# Patient Record
Sex: Female | Born: 1955 | Race: White | Hispanic: No | Marital: Married | Smoking: Never smoker
Health system: Southern US, Community
[De-identification: ages and names within clinical notes are randomized; demographics above are authoritative.]

## PROBLEM LIST (undated history)

## (undated) DIAGNOSIS — K610 Anal abscess: Secondary | ICD-10-CM

## (undated) DIAGNOSIS — G629 Polyneuropathy, unspecified: Secondary | ICD-10-CM

## (undated) DIAGNOSIS — Z9484 Stem cells transplant status: Secondary | ICD-10-CM

## (undated) DIAGNOSIS — J302 Other seasonal allergic rhinitis: Secondary | ICD-10-CM

## (undated) DIAGNOSIS — E079 Disorder of thyroid, unspecified: Secondary | ICD-10-CM

## (undated) DIAGNOSIS — E039 Hypothyroidism, unspecified: Secondary | ICD-10-CM

## (undated) HISTORY — DX: Disorder of thyroid, unspecified: E07.9

## (undated) HISTORY — PX: TUBAL LIGATION: SHX77

## (undated) HISTORY — PX: TONSILECTOMY, ADENOIDECTOMY, BILATERAL MYRINGOTOMY AND TUBES: SHX2538

## (undated) HISTORY — PX: TONSILLECTOMY: SUR1361

## (undated) HISTORY — DX: Polyneuropathy, unspecified: G62.9

## (undated) HISTORY — PX: HAND SURGERY: SHX662

---

## 2001-08-16 ENCOUNTER — Ambulatory Visit (HOSPITAL_COMMUNITY): Admission: RE | Admit: 2001-08-16 | Discharge: 2001-08-16 | Payer: Self-pay | Admitting: Obstetrics & Gynecology

## 2001-08-16 ENCOUNTER — Encounter: Payer: Self-pay | Admitting: Obstetrics & Gynecology

## 2002-07-13 ENCOUNTER — Ambulatory Visit (HOSPITAL_COMMUNITY): Admission: RE | Admit: 2002-07-13 | Discharge: 2002-07-13 | Payer: Self-pay | Admitting: Family Medicine

## 2003-12-27 ENCOUNTER — Ambulatory Visit (HOSPITAL_COMMUNITY): Admission: RE | Admit: 2003-12-27 | Discharge: 2003-12-27 | Payer: Self-pay | Admitting: Family Medicine

## 2004-01-30 ENCOUNTER — Ambulatory Visit (HOSPITAL_COMMUNITY): Admission: RE | Admit: 2004-01-30 | Discharge: 2004-01-30 | Payer: Self-pay | Admitting: Obstetrics & Gynecology

## 2004-02-25 ENCOUNTER — Ambulatory Visit (HOSPITAL_COMMUNITY): Admission: RE | Admit: 2004-02-25 | Discharge: 2004-02-25 | Payer: Self-pay | Admitting: Family Medicine

## 2005-07-06 ENCOUNTER — Ambulatory Visit (HOSPITAL_COMMUNITY): Admission: RE | Admit: 2005-07-06 | Discharge: 2005-07-06 | Payer: Self-pay | Admitting: Family Medicine

## 2005-07-15 ENCOUNTER — Ambulatory Visit (HOSPITAL_COMMUNITY): Admission: RE | Admit: 2005-07-15 | Discharge: 2005-07-15 | Payer: Self-pay | Admitting: Family Medicine

## 2005-08-13 ENCOUNTER — Ambulatory Visit: Payer: Self-pay | Admitting: Orthopedic Surgery

## 2005-10-01 ENCOUNTER — Ambulatory Visit: Payer: Self-pay | Admitting: Orthopedic Surgery

## 2006-11-10 ENCOUNTER — Observation Stay (HOSPITAL_COMMUNITY): Admission: EM | Admit: 2006-11-10 | Discharge: 2006-11-11 | Payer: Self-pay | Admitting: Emergency Medicine

## 2006-11-11 ENCOUNTER — Ambulatory Visit: Payer: Self-pay | Admitting: Orthopedic Surgery

## 2006-11-24 ENCOUNTER — Ambulatory Visit: Payer: Self-pay | Admitting: Orthopedic Surgery

## 2006-12-29 ENCOUNTER — Ambulatory Visit: Payer: Self-pay | Admitting: Orthopedic Surgery

## 2007-05-09 ENCOUNTER — Ambulatory Visit (HOSPITAL_COMMUNITY): Admission: RE | Admit: 2007-05-09 | Discharge: 2007-05-09 | Payer: Self-pay | Admitting: Obstetrics and Gynecology

## 2007-05-25 ENCOUNTER — Ambulatory Visit (HOSPITAL_COMMUNITY): Admission: RE | Admit: 2007-05-25 | Discharge: 2007-05-25 | Payer: Self-pay | Admitting: Obstetrics and Gynecology

## 2007-05-30 ENCOUNTER — Other Ambulatory Visit: Admission: RE | Admit: 2007-05-30 | Discharge: 2007-05-30 | Payer: Self-pay | Admitting: Obstetrics and Gynecology

## 2007-06-06 ENCOUNTER — Encounter: Admission: RE | Admit: 2007-06-06 | Discharge: 2007-06-06 | Payer: Self-pay | Admitting: Obstetrics and Gynecology

## 2007-11-28 ENCOUNTER — Ambulatory Visit (HOSPITAL_COMMUNITY): Admission: RE | Admit: 2007-11-28 | Discharge: 2007-11-28 | Payer: Self-pay | Admitting: Obstetrics and Gynecology

## 2008-06-21 ENCOUNTER — Ambulatory Visit (HOSPITAL_COMMUNITY): Admission: RE | Admit: 2008-06-21 | Discharge: 2008-06-21 | Payer: Self-pay | Admitting: Obstetrics and Gynecology

## 2008-07-05 ENCOUNTER — Telehealth (INDEPENDENT_AMBULATORY_CARE_PROVIDER_SITE_OTHER): Payer: Self-pay

## 2008-07-06 ENCOUNTER — Encounter: Payer: Self-pay | Admitting: Gastroenterology

## 2008-07-10 ENCOUNTER — Encounter: Payer: Self-pay | Admitting: Gastroenterology

## 2008-07-13 ENCOUNTER — Ambulatory Visit: Payer: Self-pay | Admitting: Gastroenterology

## 2008-07-13 ENCOUNTER — Ambulatory Visit (HOSPITAL_COMMUNITY): Admission: RE | Admit: 2008-07-13 | Discharge: 2008-07-13 | Payer: Self-pay | Admitting: Gastroenterology

## 2008-09-11 ENCOUNTER — Ambulatory Visit (HOSPITAL_COMMUNITY): Admission: RE | Admit: 2008-09-11 | Discharge: 2008-09-11 | Payer: Self-pay | Admitting: Family Medicine

## 2008-09-18 ENCOUNTER — Emergency Department (HOSPITAL_COMMUNITY): Admission: EM | Admit: 2008-09-18 | Discharge: 2008-09-18 | Payer: Self-pay | Admitting: Emergency Medicine

## 2008-09-28 ENCOUNTER — Encounter (HOSPITAL_COMMUNITY): Admission: RE | Admit: 2008-09-28 | Discharge: 2008-10-28 | Payer: Self-pay | Admitting: Family Medicine

## 2008-10-05 ENCOUNTER — Ambulatory Visit (HOSPITAL_COMMUNITY): Admission: RE | Admit: 2008-10-05 | Discharge: 2008-10-05 | Payer: Self-pay | Admitting: Family Medicine

## 2008-10-08 ENCOUNTER — Ambulatory Visit (HOSPITAL_COMMUNITY): Payer: Self-pay | Admitting: Oncology

## 2008-10-08 ENCOUNTER — Inpatient Hospital Stay (HOSPITAL_COMMUNITY): Admission: EM | Admit: 2008-10-08 | Discharge: 2008-10-12 | Payer: Self-pay | Admitting: Diagnostic Radiology

## 2008-10-08 ENCOUNTER — Encounter (HOSPITAL_COMMUNITY): Admission: RE | Admit: 2008-10-08 | Discharge: 2008-11-07 | Payer: Self-pay | Admitting: Oncology

## 2008-10-09 ENCOUNTER — Ambulatory Visit: Payer: Self-pay | Admitting: Oncology

## 2008-10-09 ENCOUNTER — Encounter (INDEPENDENT_AMBULATORY_CARE_PROVIDER_SITE_OTHER): Payer: Self-pay | Admitting: Interventional Radiology

## 2008-10-10 ENCOUNTER — Encounter: Payer: Self-pay | Admitting: Neurosurgery

## 2008-10-12 ENCOUNTER — Encounter: Payer: Self-pay | Admitting: Oncology

## 2008-10-15 ENCOUNTER — Ambulatory Visit: Admission: RE | Admit: 2008-10-15 | Discharge: 2008-11-14 | Payer: Self-pay | Admitting: Radiation Oncology

## 2008-11-09 ENCOUNTER — Encounter (HOSPITAL_COMMUNITY): Admission: RE | Admit: 2008-11-09 | Discharge: 2008-12-09 | Payer: Self-pay | Admitting: Oncology

## 2008-11-26 ENCOUNTER — Ambulatory Visit (HOSPITAL_COMMUNITY): Payer: Self-pay | Admitting: Oncology

## 2008-12-10 ENCOUNTER — Encounter (HOSPITAL_COMMUNITY): Admission: RE | Admit: 2008-12-10 | Discharge: 2009-01-09 | Payer: Self-pay | Admitting: Oncology

## 2009-01-11 ENCOUNTER — Ambulatory Visit (HOSPITAL_COMMUNITY): Payer: Self-pay | Admitting: Oncology

## 2009-02-04 ENCOUNTER — Encounter (HOSPITAL_COMMUNITY): Admission: RE | Admit: 2009-02-04 | Discharge: 2009-03-06 | Payer: Self-pay | Admitting: Oncology

## 2009-03-25 ENCOUNTER — Encounter (HOSPITAL_COMMUNITY): Admission: RE | Admit: 2009-03-25 | Discharge: 2009-04-24 | Payer: Self-pay | Admitting: Oncology

## 2009-03-25 ENCOUNTER — Ambulatory Visit (HOSPITAL_COMMUNITY): Payer: Self-pay | Admitting: Oncology

## 2009-04-03 ENCOUNTER — Ambulatory Visit (HOSPITAL_COMMUNITY): Admission: RE | Admit: 2009-04-03 | Discharge: 2009-04-03 | Payer: Self-pay | Admitting: Neurosurgery

## 2009-05-14 ENCOUNTER — Ambulatory Visit (HOSPITAL_COMMUNITY): Payer: Self-pay | Admitting: Oncology

## 2009-05-14 ENCOUNTER — Encounter (HOSPITAL_COMMUNITY): Admission: RE | Admit: 2009-05-14 | Discharge: 2009-06-13 | Payer: Self-pay | Admitting: Oncology

## 2009-07-02 ENCOUNTER — Encounter (HOSPITAL_COMMUNITY): Admission: RE | Admit: 2009-07-02 | Discharge: 2009-08-01 | Payer: Self-pay | Admitting: Oncology

## 2009-07-17 ENCOUNTER — Ambulatory Visit (HOSPITAL_COMMUNITY): Payer: Self-pay | Admitting: Oncology

## 2009-08-01 ENCOUNTER — Encounter (HOSPITAL_COMMUNITY): Admission: RE | Admit: 2009-08-01 | Discharge: 2009-08-31 | Payer: Self-pay | Admitting: Oncology

## 2009-08-03 ENCOUNTER — Inpatient Hospital Stay (HOSPITAL_COMMUNITY): Admission: EM | Admit: 2009-08-03 | Discharge: 2009-08-08 | Payer: Self-pay | Admitting: Emergency Medicine

## 2009-08-05 ENCOUNTER — Ambulatory Visit: Payer: Self-pay | Admitting: Oncology

## 2009-10-16 ENCOUNTER — Other Ambulatory Visit: Admission: RE | Admit: 2009-10-16 | Discharge: 2009-10-16 | Payer: Self-pay | Admitting: Obstetrics and Gynecology

## 2009-10-29 ENCOUNTER — Ambulatory Visit (HOSPITAL_COMMUNITY): Payer: Self-pay | Admitting: Oncology

## 2009-10-29 ENCOUNTER — Encounter (HOSPITAL_COMMUNITY): Admission: RE | Admit: 2009-10-29 | Discharge: 2009-11-28 | Payer: Self-pay | Admitting: Oncology

## 2009-11-01 ENCOUNTER — Ambulatory Visit (HOSPITAL_COMMUNITY): Admission: RE | Admit: 2009-11-01 | Discharge: 2009-11-01 | Payer: Self-pay | Admitting: Obstetrics and Gynecology

## 2009-12-03 ENCOUNTER — Encounter (HOSPITAL_COMMUNITY): Admission: RE | Admit: 2009-12-03 | Discharge: 2010-01-02 | Payer: Self-pay | Admitting: Oncology

## 2009-12-17 ENCOUNTER — Ambulatory Visit (HOSPITAL_COMMUNITY): Payer: Self-pay | Admitting: Oncology

## 2010-01-07 ENCOUNTER — Encounter (HOSPITAL_COMMUNITY): Admission: RE | Admit: 2010-01-07 | Discharge: 2010-01-24 | Payer: Self-pay | Admitting: Oncology

## 2010-02-26 ENCOUNTER — Ambulatory Visit (HOSPITAL_COMMUNITY): Payer: Self-pay | Admitting: Oncology

## 2010-03-04 ENCOUNTER — Encounter (HOSPITAL_COMMUNITY)
Admission: RE | Admit: 2010-03-04 | Discharge: 2010-04-03 | Payer: Self-pay | Source: Home / Self Care | Attending: Oncology | Admitting: Oncology

## 2010-05-12 ENCOUNTER — Encounter (HOSPITAL_COMMUNITY)
Admission: RE | Admit: 2010-05-12 | Discharge: 2010-05-27 | Payer: Self-pay | Source: Home / Self Care | Attending: Oncology | Admitting: Oncology

## 2010-05-12 ENCOUNTER — Ambulatory Visit (HOSPITAL_COMMUNITY)
Admission: RE | Admit: 2010-05-12 | Discharge: 2010-05-27 | Payer: Self-pay | Source: Home / Self Care | Attending: Oncology | Admitting: Oncology

## 2010-05-12 LAB — COMPREHENSIVE METABOLIC PANEL
ALT: 21 U/L (ref 0–35)
AST: 24 U/L (ref 0–37)
Albumin: 4.3 g/dL (ref 3.5–5.2)
Alkaline Phosphatase: 43 U/L (ref 39–117)
BUN: 12 mg/dL (ref 6–23)
CO2: 29 mEq/L (ref 19–32)
Calcium: 9.7 mg/dL (ref 8.4–10.5)
Chloride: 101 mEq/L (ref 96–112)
Creatinine, Ser: 1.17 mg/dL (ref 0.4–1.2)
GFR calc Af Amer: 59 mL/min — ABNORMAL LOW (ref >60)
GFR calc non Af Amer: 49 mL/min — ABNORMAL LOW (ref >60)
Glucose, Bld: 90 mg/dL (ref 70–99)
Potassium: 4.1 mEq/L (ref 3.5–5.1)
Sodium: 139 mEq/L (ref 135–145)
Total Bilirubin: 0.8 mg/dL (ref 0.3–1.2)
Total Protein: 7 g/dL (ref 6.0–8.3)

## 2010-05-12 LAB — DIFFERENTIAL
Basophils Absolute: 0 10*3/uL (ref 0.0–0.1)
Basophils Relative: 1 % (ref 0–1)
Eosinophils Absolute: 0 10*3/uL (ref 0.0–0.7)
Eosinophils Relative: 1 % (ref 0–5)
Lymphocytes Relative: 20 % (ref 12–46)
Lymphs Abs: 0.7 10*3/uL (ref 0.7–4.0)
Monocytes Absolute: 0.5 10*3/uL (ref 0.1–1.0)
Monocytes Relative: 13 % — ABNORMAL HIGH (ref 3–12)
Neutro Abs: 2.3 10*3/uL (ref 1.7–7.7)
Neutrophils Relative %: 66 % (ref 43–77)

## 2010-05-12 LAB — CBC
HCT: 38.1 % (ref 36.0–46.0)
Hemoglobin: 13.7 g/dL (ref 12.0–15.0)
MCH: 34.4 pg — ABNORMAL HIGH (ref 26.0–34.0)
MCHC: 36 g/dL (ref 30.0–36.0)
MCV: 95.7 fL (ref 78.0–100.0)
Platelets: 154 10*3/uL (ref 150–400)
RBC: 3.98 MIL/uL (ref 3.87–5.11)
RDW: 12.6 % (ref 11.5–15.5)
WBC: 3.6 10*3/uL — ABNORMAL LOW (ref 4.0–10.5)

## 2010-05-12 LAB — KAPPA/LAMBDA LIGHT CHAINS
Kappa free light chain: 1.21 mg/dL (ref 0.33–1.94)
Kappa, lambda light chain ratio: 0.89 (ref 0.26–1.65)
Lambda free light chains: 1.36 mg/dL (ref 0.57–2.63)

## 2010-05-14 LAB — MULTIPLE MYELOMA PANEL, SERUM
Albumin ELP: 60.7 % (ref 55.8–66.1)
Alpha-1-Globulin: 3.4 % (ref 2.9–4.9)
Alpha-2-Globulin: 12.2 % — ABNORMAL HIGH (ref 7.1–11.8)
Beta 2: 4.1 % (ref 3.2–6.5)
Beta Globulin: 5.3 % (ref 4.7–7.2)
Gamma Globulin: 14.3 % (ref 11.1–18.8)
IgA: 100 mg/dL (ref 68–378)
IgG (Immunoglobin G), Serum: 1230 mg/dL (ref 694–1618)
IgM, Serum: 60 mg/dL (ref 60–263)
M-Spike, %: NOT DETECTED g/dL
Total Protein: 7.5 g/dL (ref 6.0–8.3)

## 2010-05-15 ENCOUNTER — Ambulatory Visit (HOSPITAL_COMMUNITY)
Admission: RE | Admit: 2010-05-15 | Discharge: 2010-05-15 | Payer: Self-pay | Source: Home / Self Care | Attending: Oncology | Admitting: Oncology

## 2010-05-18 ENCOUNTER — Encounter: Payer: Self-pay | Admitting: Obstetrics and Gynecology

## 2010-05-19 ENCOUNTER — Encounter: Payer: Self-pay | Admitting: Family Medicine

## 2010-06-19 ENCOUNTER — Encounter (HOSPITAL_COMMUNITY): Payer: 59 | Attending: Oncology

## 2010-06-19 ENCOUNTER — Other Ambulatory Visit (HOSPITAL_COMMUNITY): Payer: 59

## 2010-06-19 DIAGNOSIS — Z9481 Bone marrow transplant status: Secondary | ICD-10-CM | POA: Insufficient documentation

## 2010-06-19 DIAGNOSIS — C9000 Multiple myeloma not having achieved remission: Secondary | ICD-10-CM

## 2010-06-19 DIAGNOSIS — Z79899 Other long term (current) drug therapy: Secondary | ICD-10-CM | POA: Insufficient documentation

## 2010-06-20 ENCOUNTER — Ambulatory Visit (HOSPITAL_COMMUNITY): Payer: 59

## 2010-06-20 DIAGNOSIS — C9000 Multiple myeloma not having achieved remission: Secondary | ICD-10-CM

## 2010-07-01 ENCOUNTER — Ambulatory Visit (HOSPITAL_COMMUNITY): Payer: 59 | Admitting: Oncology

## 2010-07-08 LAB — COMPREHENSIVE METABOLIC PANEL
ALT: 17 U/L (ref 0–35)
Albumin: 4 g/dL (ref 3.5–5.2)
Alkaline Phosphatase: 46 U/L (ref 39–117)
Calcium: 9.3 mg/dL (ref 8.4–10.5)
GFR calc Af Amer: >60 mL/min (ref >60)
Potassium: 4.4 mEq/L (ref 3.5–5.1)
Sodium: 137 mEq/L (ref 135–145)
Total Protein: 6.8 g/dL (ref 6.0–8.3)

## 2010-07-08 LAB — CBC
Platelets: 165 10*3/uL (ref 150–400)
RDW: 12.7 % (ref 11.5–15.5)
WBC: 3.6 10*3/uL — ABNORMAL LOW (ref 4.0–10.5)

## 2010-07-08 LAB — DIFFERENTIAL
Basophils Relative: 1 % (ref 0–1)
Eosinophils Absolute: 0.2 10*3/uL (ref 0.0–0.7)
Lymphs Abs: 1 10*3/uL (ref 0.7–4.0)
Monocytes Absolute: 0.7 10*3/uL (ref 0.1–1.0)
Monocytes Relative: 19 % — ABNORMAL HIGH (ref 3–12)

## 2010-07-09 LAB — COMPREHENSIVE METABOLIC PANEL
AST: 22 U/L (ref 0–37)
BUN: 13 mg/dL (ref 6–23)
CO2: 30 mEq/L (ref 19–32)
Calcium: 9.2 mg/dL (ref 8.4–10.5)
Chloride: 103 mEq/L (ref 96–112)
Creatinine, Ser: 1.04 mg/dL (ref 0.4–1.2)
GFR calc Af Amer: >60 mL/min (ref >60)
GFR calc non Af Amer: 56 mL/min — ABNORMAL LOW (ref >60)
Total Bilirubin: 0.6 mg/dL (ref 0.3–1.2)

## 2010-07-09 LAB — CBC
Hemoglobin: 12 g/dL (ref 12.0–15.0)
MCH: 35.4 pg — ABNORMAL HIGH (ref 26.0–34.0)
MCHC: 34.7 g/dL (ref 30.0–36.0)
MCV: 101.9 fL — ABNORMAL HIGH (ref 78.0–100.0)
Platelets: 158 10*3/uL (ref 150–400)

## 2010-07-09 LAB — DIFFERENTIAL
Basophils Absolute: 0 10*3/uL (ref 0.0–0.1)
Eosinophils Relative: 5 % (ref 0–5)
Lymphocytes Relative: 31 % (ref 12–46)
Lymphs Abs: 0.9 10*3/uL (ref 0.7–4.0)
Neutro Abs: 1.4 10*3/uL — ABNORMAL LOW (ref 1.7–7.7)

## 2010-07-10 ENCOUNTER — Encounter (HOSPITAL_COMMUNITY): Payer: 59 | Attending: Oncology

## 2010-07-10 ENCOUNTER — Other Ambulatory Visit (HOSPITAL_COMMUNITY): Payer: 59

## 2010-07-10 DIAGNOSIS — Z79899 Other long term (current) drug therapy: Secondary | ICD-10-CM | POA: Insufficient documentation

## 2010-07-10 DIAGNOSIS — C9000 Multiple myeloma not having achieved remission: Secondary | ICD-10-CM | POA: Insufficient documentation

## 2010-07-10 DIAGNOSIS — Z9481 Bone marrow transplant status: Secondary | ICD-10-CM | POA: Insufficient documentation

## 2010-07-10 LAB — DIFFERENTIAL
Basophils Absolute: 0 10*3/uL (ref 0.0–0.1)
Eosinophils Relative: 4 % (ref 0–5)
Lymphocytes Relative: 26 % (ref 12–46)
Lymphocytes Relative: 20 % (ref 12–46)
Lymphs Abs: 0.7 10*3/uL (ref 0.7–4.0)
Monocytes Absolute: 0.3 10*3/uL (ref 0.1–1.0)
Monocytes Absolute: 0.4 10*3/uL (ref 0.1–1.0)
Monocytes Relative: 10 % (ref 3–12)
Monocytes Relative: 11 % (ref 3–12)
Neutro Abs: 1.6 10*3/uL — ABNORMAL LOW (ref 1.7–7.7)
Neutro Abs: 2.5 10*3/uL (ref 1.7–7.7)

## 2010-07-10 LAB — COMPREHENSIVE METABOLIC PANEL
Albumin: 4.6 g/dL (ref 3.5–5.2)
Alkaline Phosphatase: 53 U/L (ref 39–117)
BUN: 13 mg/dL (ref 6–23)
CO2: 30 mEq/L (ref 19–32)
Chloride: 104 mEq/L (ref 96–112)
Creatinine, Ser: 1 mg/dL (ref 0.4–1.2)
GFR calc non Af Amer: 58 mL/min — ABNORMAL LOW (ref >60)
Potassium: 3.9 mEq/L (ref 3.5–5.1)
Total Bilirubin: 0.6 mg/dL (ref 0.3–1.2)

## 2010-07-10 LAB — CBC
HCT: 35.4 % — ABNORMAL LOW (ref 36.0–46.0)
HCT: 35 % — ABNORMAL LOW (ref 36.0–46.0)
Hemoglobin: 12.4 g/dL (ref 12.0–15.0)
Hemoglobin: 12.2 g/dL (ref 12.0–15.0)
MCV: 102.6 fL — ABNORMAL HIGH (ref 78.0–100.0)
MCV: 102.4 fL — ABNORMAL HIGH (ref 78.0–100.0)
RBC: 3.46 MIL/uL — ABNORMAL LOW (ref 3.87–5.11)
WBC: 2.7 10*3/uL — ABNORMAL LOW (ref 4.0–10.5)
WBC: 3.8 10*3/uL — ABNORMAL LOW (ref 4.0–10.5)

## 2010-07-10 LAB — MULTIPLE MYELOMA PANEL, SERUM
Alpha-1-Globulin: 3.3 % (ref 2.9–4.9)
Alpha-2-Globulin: 12.2 % — ABNORMAL HIGH (ref 7.1–11.8)
Beta Globulin: 5.5 % (ref 4.7–7.2)
Gamma Globulin: 12.7 % (ref 11.1–18.8)
IgM, Serum: 29 mg/dL — ABNORMAL LOW (ref 60–263)
Total Protein: 7.1 g/dL (ref 6.0–8.3)

## 2010-07-10 LAB — KAPPA/LAMBDA LIGHT CHAINS: Lambda free light chains: 1.17 mg/dL (ref 0.57–2.63)

## 2010-07-11 LAB — MULTIPLE MYELOMA PANEL, SERUM
Albumin ELP: 64.9 % (ref 55.8–66.1)
IgA: 37 mg/dL — ABNORMAL LOW (ref 68–378)
IgG (Immunoglobin G), Serum: 920 mg/dL (ref 694–1618)
IgM, Serum: 19 mg/dL — ABNORMAL LOW (ref 60–263)
Total Protein: 6.8 g/dL (ref 6.0–8.3)

## 2010-07-11 LAB — DIFFERENTIAL
Basophils Relative: 1 % (ref 0–1)
Eosinophils Absolute: 0.1 10*3/uL (ref 0.0–0.7)
Eosinophils Relative: 2 % (ref 0–5)
Monocytes Absolute: 0.3 10*3/uL (ref 0.1–1.0)
Monocytes Relative: 9 % (ref 3–12)

## 2010-07-11 LAB — COMPREHENSIVE METABOLIC PANEL
AST: 20 U/L (ref 0–37)
Albumin: 4.3 g/dL (ref 3.5–5.2)
Calcium: 9.5 mg/dL (ref 8.4–10.5)
Creatinine, Ser: 0.97 mg/dL (ref 0.4–1.2)
GFR calc Af Amer: >60 mL/min (ref >60)
GFR calc non Af Amer: >60 mL/min (ref >60)

## 2010-07-11 LAB — KAPPA/LAMBDA LIGHT CHAINS: Kappa, lambda light chain ratio: 0.7 (ref 0.26–1.65)

## 2010-07-11 LAB — CBC
HCT: 35.2 % — ABNORMAL LOW (ref 36.0–46.0)
Hemoglobin: 12.4 g/dL (ref 12.0–15.0)
MCH: 36.3 pg — ABNORMAL HIGH (ref 26.0–34.0)
MCHC: 35.2 g/dL (ref 30.0–36.0)
MCV: 103.4 fL — ABNORMAL HIGH (ref 78.0–100.0)

## 2010-07-13 LAB — DIFFERENTIAL
Basophils Relative: 0 % (ref 0–1)
Monocytes Absolute: 0.4 10*3/uL (ref 0.1–1.0)
Monocytes Relative: 12 % (ref 3–12)
Neutro Abs: 1.8 10*3/uL (ref 1.7–7.7)

## 2010-07-13 LAB — CBC
HCT: 34 % — ABNORMAL LOW (ref 36.0–46.0)
Hemoglobin: 12 g/dL (ref 12.0–15.0)
MCH: 36.7 pg — ABNORMAL HIGH (ref 26.0–34.0)
MCHC: 35.4 g/dL (ref 30.0–36.0)
MCV: 103.9 fL — ABNORMAL HIGH (ref 78.0–100.0)
Platelets: 163 10*3/uL (ref 150–400)
RBC: 3.28 MIL/uL — ABNORMAL LOW (ref 3.87–5.11)
RDW: 12.2 % (ref 11.5–15.5)
WBC: 3 10*3/uL — ABNORMAL LOW (ref 4.0–10.5)

## 2010-07-15 LAB — CBC
HCT: 33.8 % — ABNORMAL LOW (ref 36.0–46.0)
Hemoglobin: 11.9 g/dL — ABNORMAL LOW (ref 12.0–15.0)
MCHC: 35.4 g/dL (ref 30.0–36.0)
MCV: 101.1 fL — ABNORMAL HIGH (ref 78.0–100.0)
RDW: 14.4 % (ref 11.5–15.5)

## 2010-07-15 LAB — DIFFERENTIAL
Basophils Absolute: 0 10*3/uL (ref 0.0–0.1)
Basophils Relative: 1 % (ref 0–1)
Eosinophils Absolute: 0 10*3/uL (ref 0.0–0.7)
Eosinophils Relative: 2 % (ref 0–5)
Monocytes Absolute: 0.2 10*3/uL (ref 0.1–1.0)
Neutro Abs: 1.2 10*3/uL — ABNORMAL LOW (ref 1.7–7.7)

## 2010-07-16 LAB — CBC
HCT: 27.4 % — ABNORMAL LOW (ref 36.0–46.0)
HCT: 28.7 % — ABNORMAL LOW (ref 36.0–46.0)
HCT: 28.9 % — ABNORMAL LOW (ref 36.0–46.0)
HCT: 31.5 % — ABNORMAL LOW (ref 36.0–46.0)
Hemoglobin: 10.4 g/dL — ABNORMAL LOW (ref 12.0–15.0)
Hemoglobin: 10.5 g/dL — ABNORMAL LOW (ref 12.0–15.0)
Hemoglobin: 9.8 g/dL — ABNORMAL LOW (ref 12.0–15.0)
Hemoglobin: 10.8 g/dL — ABNORMAL LOW (ref 12.0–15.0)
Hemoglobin: 11 g/dL — ABNORMAL LOW (ref 12.0–15.0)
Hemoglobin: 11.1 g/dL — ABNORMAL LOW (ref 12.0–15.0)
MCHC: 35.8 g/dL (ref 30.0–36.0)
MCHC: 35.8 g/dL (ref 30.0–36.0)
MCHC: 37.2 g/dL — ABNORMAL HIGH (ref 30.0–36.0)
MCHC: 34.1 g/dL (ref 30.0–36.0)
MCHC: 34.2 g/dL (ref 30.0–36.0)
MCHC: 34.8 g/dL (ref 30.0–36.0)
MCV: 101.5 fL — ABNORMAL HIGH (ref 78.0–100.0)
MCV: 100 fL (ref 78.0–100.0)
MCV: 100.1 fL — ABNORMAL HIGH (ref 78.0–100.0)
MCV: 100.5 fL — ABNORMAL HIGH (ref 78.0–100.0)
Platelets: 103 10*3/uL — ABNORMAL LOW (ref 150–400)
Platelets: 54 10*3/uL — ABNORMAL LOW (ref 150–400)
Platelets: 57 10*3/uL — ABNORMAL LOW (ref 150–400)
Platelets: 62 10*3/uL — ABNORMAL LOW (ref 150–400)
Platelets: 84 10*3/uL — ABNORMAL LOW (ref 150–400)
Platelets: 186 10*3/uL (ref 150–400)
RBC: 2.82 MIL/uL — ABNORMAL LOW (ref 3.87–5.11)
RBC: 2.83 MIL/uL — ABNORMAL LOW (ref 3.87–5.11)
RBC: 3.13 MIL/uL — ABNORMAL LOW (ref 3.87–5.11)
RBC: 3.2 MIL/uL — ABNORMAL LOW (ref 3.87–5.11)
RBC: 3.22 MIL/uL — ABNORMAL LOW (ref 3.87–5.11)
RDW: 13.7 % (ref 11.5–15.5)
RDW: 14.1 % (ref 11.5–15.5)
RDW: 14.6 % (ref 11.5–15.5)
RDW: 14.6 % (ref 11.5–15.5)
RDW: 16.2 % — ABNORMAL HIGH (ref 11.5–15.5)
WBC: 0.8 10*3/uL — CL (ref 4.0–10.5)
WBC: 0.9 10*3/uL — CL (ref 4.0–10.5)
WBC: 2 10*3/uL — ABNORMAL LOW (ref 4.0–10.5)
WBC: 3.1 10*3/uL — ABNORMAL LOW (ref 4.0–10.5)
WBC: 3.4 10*3/uL — ABNORMAL LOW (ref 4.0–10.5)
WBC: 5.6 10*3/uL (ref 4.0–10.5)

## 2010-07-16 LAB — COMPREHENSIVE METABOLIC PANEL
ALT: 22 U/L (ref 0–35)
ALT: 40 U/L — ABNORMAL HIGH (ref 0–35)
AST: 19 U/L (ref 0–37)
AST: 22 U/L (ref 0–37)
AST: 33 U/L (ref 0–37)
Albumin: 2.9 g/dL — ABNORMAL LOW (ref 3.5–5.2)
Alkaline Phosphatase: 37 U/L — ABNORMAL LOW (ref 39–117)
Alkaline Phosphatase: 49 U/L (ref 39–117)
BUN: 8 mg/dL (ref 6–23)
CO2: 25 mEq/L (ref 19–32)
CO2: 33 mEq/L — ABNORMAL HIGH (ref 19–32)
Calcium: 8.1 mg/dL — ABNORMAL LOW (ref 8.4–10.5)
Calcium: 9.5 mg/dL (ref 8.4–10.5)
Chloride: 107 mEq/L (ref 96–112)
Creatinine, Ser: 0.82 mg/dL (ref 0.4–1.2)
GFR calc Af Amer: >60 mL/min (ref >60)
GFR calc Af Amer: >60 mL/min (ref >60)
GFR calc Af Amer: >60 mL/min (ref >60)
GFR calc non Af Amer: >60 mL/min (ref >60)
GFR calc non Af Amer: >60 mL/min (ref >60)
Glucose, Bld: 95 mg/dL (ref 70–99)
Glucose, Bld: 107 mg/dL — ABNORMAL HIGH (ref 70–99)
Potassium: 3.3 mEq/L — ABNORMAL LOW (ref 3.5–5.1)
Potassium: 3.8 mEq/L (ref 3.5–5.1)
Potassium: 3.7 mEq/L (ref 3.5–5.1)
Sodium: 138 mEq/L (ref 135–145)
Sodium: 139 mEq/L (ref 135–145)
Total Protein: 5.3 g/dL — ABNORMAL LOW (ref 6.0–8.3)

## 2010-07-16 LAB — BASIC METABOLIC PANEL
BUN: 11 mg/dL (ref 6–23)
BUN: 4 mg/dL — ABNORMAL LOW (ref 6–23)
BUN: 5 mg/dL — ABNORMAL LOW (ref 6–23)
CO2: 24 mEq/L (ref 19–32)
CO2: 25 mEq/L (ref 19–32)
Calcium: 7.6 mg/dL — ABNORMAL LOW (ref 8.4–10.5)
Calcium: 7.6 mg/dL — ABNORMAL LOW (ref 8.4–10.5)
Calcium: 8.9 mg/dL (ref 8.4–10.5)
Creatinine, Ser: 0.71 mg/dL (ref 0.4–1.2)
GFR calc non Af Amer: 57 mL/min — ABNORMAL LOW (ref >60)
GFR calc non Af Amer: >60 mL/min (ref >60)
GFR calc non Af Amer: >60 mL/min (ref >60)
Glucose, Bld: 84 mg/dL (ref 70–99)
Glucose, Bld: 90 mg/dL (ref 70–99)
Potassium: 3.6 mEq/L (ref 3.5–5.1)
Potassium: 3.7 mEq/L (ref 3.5–5.1)
Sodium: 132 mEq/L — ABNORMAL LOW (ref 135–145)
Sodium: 141 mEq/L (ref 135–145)

## 2010-07-16 LAB — DIFFERENTIAL
Basophils Absolute: 0 10*3/uL (ref 0.0–0.1)
Basophils Absolute: 0 10*3/uL (ref 0.0–0.1)
Basophils Absolute: 0 10*3/uL (ref 0.0–0.1)
Basophils Absolute: 0 10*3/uL (ref 0.0–0.1)
Basophils Absolute: 0 10*3/uL (ref 0.0–0.1)
Basophils Relative: 1 % (ref 0–1)
Basophils Relative: 1 % (ref 0–1)
Basophils Relative: 0 % (ref 0–1)
Basophils Relative: 0 % (ref 0–1)
Basophils Relative: 1 % (ref 0–1)
Eosinophils Absolute: 0 10*3/uL (ref 0.0–0.7)
Eosinophils Absolute: 0.1 10*3/uL (ref 0.0–0.7)
Eosinophils Absolute: 0.1 10*3/uL (ref 0.0–0.7)
Eosinophils Absolute: 0.1 10*3/uL (ref 0.0–0.7)
Eosinophils Relative: 0 % (ref 0–5)
Eosinophils Relative: 0 % (ref 0–5)
Eosinophils Relative: 3 % (ref 0–5)
Eosinophils Relative: 4 % (ref 0–5)
Eosinophils Relative: 2 % (ref 0–5)
Eosinophils Relative: 3 % (ref 0–5)
Eosinophils Relative: 4 % (ref 0–5)
Lymphocytes Relative: 14 % (ref 12–46)
Lymphocytes Relative: 16 % (ref 12–46)
Lymphocytes Relative: 23 % (ref 12–46)
Lymphocytes Relative: 27 % (ref 12–46)
Lymphocytes Relative: 30 % (ref 12–46)
Lymphocytes Relative: 34 % (ref 12–46)
Lymphs Abs: 0.2 10*3/uL — ABNORMAL LOW (ref 0.7–4.0)
Lymphs Abs: 0.2 10*3/uL — ABNORMAL LOW (ref 0.7–4.0)
Lymphs Abs: 0.5 10*3/uL — ABNORMAL LOW (ref 0.7–4.0)
Lymphs Abs: 1 10*3/uL (ref 0.7–4.0)
Lymphs Abs: 1.2 10*3/uL (ref 0.7–4.0)
Monocytes Absolute: 0.2 10*3/uL (ref 0.1–1.0)
Monocytes Absolute: 0.2 10*3/uL (ref 0.1–1.0)
Monocytes Absolute: 0.2 10*3/uL (ref 0.1–1.0)
Monocytes Absolute: 0.2 10*3/uL (ref 0.1–1.0)
Monocytes Absolute: 0.5 10*3/uL (ref 0.1–1.0)
Monocytes Absolute: 0.5 10*3/uL (ref 0.1–1.0)
Monocytes Relative: 17 % — ABNORMAL HIGH (ref 3–12)
Monocytes Relative: 8 % (ref 3–12)
Monocytes Relative: 12 % (ref 3–12)
Monocytes Relative: 14 % — ABNORMAL HIGH (ref 3–12)
Monocytes Relative: 8 % (ref 3–12)
Neutro Abs: 0.4 10*3/uL — ABNORMAL LOW (ref 1.7–7.7)
Neutro Abs: 1 10*3/uL — ABNORMAL LOW (ref 1.7–7.7)
Neutro Abs: 1.5 10*3/uL — ABNORMAL LOW (ref 1.7–7.7)
Neutro Abs: 3.4 10*3/uL (ref 1.7–7.7)
Neutrophils Relative %: 43 % (ref 43–77)
Neutrophils Relative %: 51 % (ref 43–77)
Neutrophils Relative %: 51 % (ref 43–77)
Neutrophils Relative %: 51 % (ref 43–77)
Neutrophils Relative %: 63 % (ref 43–77)
Neutrophils Relative %: 79 % — ABNORMAL HIGH (ref 43–77)
Smear Review: DECREASED

## 2010-07-16 LAB — TSH: TSH: 3.967 u[IU]/mL (ref 0.350–4.500)

## 2010-07-16 LAB — BRAIN NATRIURETIC PEPTIDE
Pro B Natriuretic peptide (BNP): 50.4 pg/mL (ref 0.0–100.0)
Pro B Natriuretic peptide (BNP): 52.2 pg/mL (ref 0.0–100.0)

## 2010-07-16 LAB — URINALYSIS, ROUTINE W REFLEX MICROSCOPIC
Nitrite: NEGATIVE
Specific Gravity, Urine: 1.01 (ref 1.005–1.030)
Urobilinogen, UA: 0.2 mg/dL (ref 0.0–1.0)
pH: 6.5 (ref 5.0–8.0)

## 2010-07-16 LAB — PROTEIN ELECTROPHORESIS, SERUM
Albumin ELP: 61.5 % (ref 55.8–66.1)
M-Spike, %: NOT DETECTED g/dL
Total Protein ELP: 6.7 g/dL (ref 6.0–8.3)

## 2010-07-16 LAB — MISCELLANEOUS TEST

## 2010-07-16 LAB — CULTURE, BLOOD (ROUTINE X 2)
Culture: NO GROWTH
Culture: NO GROWTH
Report Status: 4142011

## 2010-07-16 LAB — IMMUNOFIXATION ADD-ON

## 2010-07-16 LAB — MRSA PCR SCREENING: MRSA by PCR: NEGATIVE

## 2010-07-17 ENCOUNTER — Ambulatory Visit (HOSPITAL_COMMUNITY): Payer: 59

## 2010-07-17 DIAGNOSIS — C9000 Multiple myeloma not having achieved remission: Secondary | ICD-10-CM

## 2010-07-20 LAB — KAPPA/LAMBDA LIGHT CHAINS
Kappa, lambda light chain ratio: 0.84 (ref 0.26–1.65)
Lambda free light chains: 1.35 mg/dL (ref 0.57–2.63)

## 2010-07-20 LAB — DIFFERENTIAL
Basophils Absolute: 0.1 10*3/uL (ref 0.0–0.1)
Basophils Relative: 2 % — ABNORMAL HIGH (ref 0–1)
Eosinophils Absolute: 0.2 10*3/uL (ref 0.0–0.7)
Eosinophils Relative: 8 % — ABNORMAL HIGH (ref 0–5)
Lymphs Abs: 0.7 10*3/uL (ref 0.7–4.0)
Lymphs Abs: 1.1 10*3/uL (ref 0.7–4.0)
Monocytes Absolute: 0.3 10*3/uL (ref 0.1–1.0)
Monocytes Absolute: 0.4 10*3/uL (ref 0.1–1.0)
Monocytes Relative: 12 % (ref 3–12)
Monocytes Relative: 14 % — ABNORMAL HIGH (ref 3–12)
Neutro Abs: 1.3 10*3/uL — ABNORMAL LOW (ref 1.7–7.7)

## 2010-07-20 LAB — PROTEIN ELECTROPHORESIS, SERUM
Alpha-2-Globulin: 10.7 % (ref 7.1–11.8)
Gamma Globulin: 11.4 % (ref 11.1–18.8)
M-Spike, %: NOT DETECTED g/dL
Total Protein ELP: 6.6 g/dL (ref 6.0–8.3)

## 2010-07-20 LAB — IMMUNOFIXATION ELECTROPHORESIS
IgA: 42 mg/dL — ABNORMAL LOW (ref 68–378)
IgM, Serum: 14 mg/dL — ABNORMAL LOW (ref 60–263)

## 2010-07-20 LAB — COMPREHENSIVE METABOLIC PANEL
Alkaline Phosphatase: 43 U/L (ref 39–117)
BUN: 14 mg/dL (ref 6–23)
CO2: 31 mEq/L (ref 19–32)
Chloride: 103 mEq/L (ref 96–112)
Creatinine, Ser: 0.91 mg/dL (ref 0.4–1.2)
GFR calc non Af Amer: >60 mL/min (ref >60)
Potassium: 3.9 mEq/L (ref 3.5–5.1)
Total Bilirubin: 0.6 mg/dL (ref 0.3–1.2)

## 2010-07-20 LAB — CBC
HCT: 31.7 % — ABNORMAL LOW (ref 36.0–46.0)
Hemoglobin: 11.5 g/dL — ABNORMAL LOW (ref 12.0–15.0)
MCHC: 35.6 g/dL (ref 30.0–36.0)
MCHC: 36.5 g/dL — ABNORMAL HIGH (ref 30.0–36.0)
MCV: 99.7 fL (ref 78.0–100.0)
Platelets: 127 10*3/uL — ABNORMAL LOW (ref 150–400)
Platelets: 93 10*3/uL — ABNORMAL LOW (ref 150–400)
RDW: 14.1 % (ref 11.5–15.5)
RDW: 14.4 % (ref 11.5–15.5)
WBC: 3.1 10*3/uL — ABNORMAL LOW (ref 4.0–10.5)

## 2010-07-28 LAB — URINALYSIS, ROUTINE W REFLEX MICROSCOPIC
Glucose, UA: NEGATIVE mg/dL
Protein, ur: NEGATIVE mg/dL
Urobilinogen, UA: 0.2 mg/dL (ref 0.0–1.0)

## 2010-07-28 LAB — CBC
HCT: 32.7 % — ABNORMAL LOW (ref 36.0–46.0)
Hemoglobin: 11.2 g/dL — ABNORMAL LOW (ref 12.0–15.0)
RBC: 3.49 MIL/uL — ABNORMAL LOW (ref 3.87–5.11)
WBC: 2.9 10*3/uL — ABNORMAL LOW (ref 4.0–10.5)

## 2010-07-28 LAB — DIFFERENTIAL
Basophils Absolute: 0 10*3/uL (ref 0.0–0.1)
Eosinophils Relative: 7 % — ABNORMAL HIGH (ref 0–5)
Lymphocytes Relative: 18 % (ref 12–46)
Lymphs Abs: 0.5 10*3/uL — ABNORMAL LOW (ref 0.7–4.0)
Monocytes Absolute: 0.8 10*3/uL (ref 0.1–1.0)
Monocytes Relative: 28 % — ABNORMAL HIGH (ref 3–12)
Neutro Abs: 1.3 10*3/uL — ABNORMAL LOW (ref 1.7–7.7)

## 2010-07-28 LAB — URINE CULTURE
Colony Count: NO GROWTH
Culture: NO GROWTH
Special Requests: POSITIVE

## 2010-07-28 LAB — BASIC METABOLIC PANEL
Calcium: 9.6 mg/dL (ref 8.4–10.5)
GFR calc Af Amer: >60 mL/min (ref >60)
GFR calc non Af Amer: 53 mL/min — ABNORMAL LOW (ref >60)
Glucose, Bld: 90 mg/dL (ref 70–99)
Potassium: 3.9 mEq/L (ref 3.5–5.1)
Sodium: 136 mEq/L (ref 135–145)

## 2010-07-29 LAB — BASIC METABOLIC PANEL
BUN: 14 mg/dL (ref 6–23)
Calcium: 9.4 mg/dL (ref 8.4–10.5)
Chloride: 96 mEq/L (ref 96–112)
Chloride: 97 mEq/L (ref 96–112)
Creatinine, Ser: 1.24 mg/dL — ABNORMAL HIGH (ref 0.4–1.2)
GFR calc Af Amer: 58 mL/min — ABNORMAL LOW (ref >60)
Potassium: 2.8 mEq/L — ABNORMAL LOW (ref 3.5–5.1)
Sodium: 138 mEq/L (ref 135–145)

## 2010-07-29 LAB — DIFFERENTIAL
Eosinophils Absolute: 0.5 10*3/uL (ref 0.0–0.7)
Eosinophils Relative: 8 % — ABNORMAL HIGH (ref 0–5)
Lymphocytes Relative: 13 % (ref 12–46)
Lymphs Abs: 0.6 10*3/uL — ABNORMAL LOW (ref 0.7–4.0)
Monocytes Absolute: 0.7 10*3/uL (ref 0.1–1.0)
Monocytes Relative: 12 % (ref 3–12)
Myelocytes: 0 %
Neutro Abs: 4.2 10*3/uL (ref 1.7–7.7)
Neutrophils Relative %: 70 % (ref 43–77)
Neutrophils Relative %: 76 % (ref 43–77)
Promyelocytes Absolute: 0 %
nRBC: 0 /100 WBC

## 2010-07-29 LAB — CBC
HCT: 32.7 % — ABNORMAL LOW (ref 36.0–46.0)
Hemoglobin: 11.1 g/dL — ABNORMAL LOW (ref 12.0–15.0)
MCHC: 35.4 g/dL (ref 30.0–36.0)
MCV: 93.6 fL (ref 78.0–100.0)
MCV: 95 fL (ref 78.0–100.0)
Platelets: 186 10*3/uL (ref 150–400)
RBC: 3.44 MIL/uL — ABNORMAL LOW (ref 3.87–5.11)
WBC: 5.5 10*3/uL (ref 4.0–10.5)
WBC: 5.7 10*3/uL (ref 4.0–10.5)

## 2010-07-29 LAB — MAGNESIUM: Magnesium: 1.8 mg/dL (ref 1.5–2.5)

## 2010-07-30 LAB — BASIC METABOLIC PANEL
CO2: 32 mEq/L (ref 19–32)
Calcium: 8.6 mg/dL (ref 8.4–10.5)
Glucose, Bld: 105 mg/dL — ABNORMAL HIGH (ref 70–99)
Sodium: 135 mEq/L (ref 135–145)

## 2010-07-30 LAB — DIFFERENTIAL
Basophils Absolute: 0 10*3/uL (ref 0.0–0.1)
Basophils Relative: 1 % (ref 0–1)
Eosinophils Absolute: 0 10*3/uL (ref 0.0–0.7)
Eosinophils Relative: 0 % (ref 0–5)
Metamyelocytes Relative: 0 %
Myelocytes: 0 %
nRBC: 0 /100 WBC

## 2010-07-30 LAB — CBC
Hemoglobin: 12.4 g/dL (ref 12.0–15.0)
MCHC: 34.3 g/dL (ref 30.0–36.0)
RDW: 16.5 % — ABNORMAL HIGH (ref 11.5–15.5)

## 2010-07-30 LAB — MAGNESIUM: Magnesium: 1.9 mg/dL (ref 1.5–2.5)

## 2010-07-31 LAB — DIFFERENTIAL
Basophils Absolute: 0 10*3/uL (ref 0.0–0.1)
Basophils Relative: 0 % (ref 0–1)
Eosinophils Relative: 6 % — ABNORMAL HIGH (ref 0–5)
Lymphocytes Relative: 15 % (ref 12–46)
Lymphocytes Relative: 6 % — ABNORMAL LOW (ref 12–46)
Lymphs Abs: 0.1 10*3/uL — ABNORMAL LOW (ref 0.7–4.0)
Lymphs Abs: 0.3 10*3/uL — ABNORMAL LOW (ref 0.7–4.0)
Monocytes Relative: 1 % — ABNORMAL LOW (ref 3–12)
Neutro Abs: 0.2 10*3/uL — ABNORMAL LOW (ref 1.7–7.7)
Neutro Abs: 3.6 10*3/uL (ref 1.7–7.7)
Neutro Abs: 6 10*3/uL (ref 1.7–7.7)
Neutrophils Relative %: 77 % (ref 43–77)
Neutrophils Relative %: 96 % — ABNORMAL HIGH (ref 43–77)
Smear Review: DECREASED

## 2010-07-31 LAB — BASIC METABOLIC PANEL
BUN: 7 mg/dL (ref 6–23)
Calcium: 9.5 mg/dL (ref 8.4–10.5)
Creatinine, Ser: 0.59 mg/dL (ref 0.4–1.2)
GFR calc Af Amer: >60 mL/min (ref >60)
GFR calc non Af Amer: >60 mL/min (ref >60)
Potassium: 3.9 mEq/L (ref 3.5–5.1)

## 2010-07-31 LAB — CBC
HCT: 35.6 % — ABNORMAL LOW (ref 36.0–46.0)
HCT: 36.1 % (ref 36.0–46.0)
Hemoglobin: 12.9 g/dL (ref 12.0–15.0)
MCHC: 35.9 g/dL (ref 30.0–36.0)
Platelets: 42 10*3/uL — CL (ref 150–400)
Platelets: 60 10*3/uL — ABNORMAL LOW (ref 150–400)
Platelets: 98 10*3/uL — ABNORMAL LOW (ref 150–400)
RBC: 3.64 MIL/uL — ABNORMAL LOW (ref 3.87–5.11)
RDW: 12.7 % (ref 11.5–15.5)
RDW: 12.8 % (ref 11.5–15.5)
WBC: 4.7 10*3/uL (ref 4.0–10.5)
WBC: 6.3 10*3/uL (ref 4.0–10.5)

## 2010-07-31 LAB — MAGNESIUM
Magnesium: 2.1 mg/dL (ref 1.5–2.5)
Magnesium: 2.2 mg/dL (ref 1.5–2.5)

## 2010-08-01 LAB — DIFFERENTIAL
Basophils Relative: 0 % (ref 0–1)
Lymphs Abs: 0.7 10*3/uL (ref 0.7–4.0)
Monocytes Absolute: 0.4 10*3/uL (ref 0.1–1.0)
Monocytes Relative: 9 % (ref 3–12)
Neutro Abs: 3.6 10*3/uL (ref 1.7–7.7)

## 2010-08-01 LAB — CBC
MCHC: 35.8 g/dL (ref 30.0–36.0)
Platelets: 171 10*3/uL (ref 150–400)
RDW: 14.6 % (ref 11.5–15.5)

## 2010-08-01 LAB — PROTEIN ELECTROPH W RFLX QUANT IMMUNOGLOBULINS
Beta 2: 2.2 % — ABNORMAL LOW (ref 3.2–6.5)
Beta Globulin: 7.1 % (ref 4.7–7.2)
M-Spike, %: NOT DETECTED g/dL

## 2010-08-01 LAB — IMMUNOFIXATION ELECTROPHORESIS
IgA: 26 mg/dL — ABNORMAL LOW (ref 68–378)
IgG (Immunoglobin G), Serum: 462 mg/dL — ABNORMAL LOW (ref 694–1618)
IgM, Serum: 30 mg/dL — ABNORMAL LOW (ref 60–263)
Total Protein ELP: 5.8 g/dL — ABNORMAL LOW (ref 6.0–8.3)

## 2010-08-01 LAB — COMPREHENSIVE METABOLIC PANEL
ALT: 22 U/L (ref 0–35)
Albumin: 3.8 g/dL (ref 3.5–5.2)
Alkaline Phosphatase: 39 U/L (ref 39–117)
BUN: 9 mg/dL (ref 6–23)
Calcium: 9 mg/dL (ref 8.4–10.5)
Potassium: 4.3 mEq/L (ref 3.5–5.1)
Sodium: 141 mEq/L (ref 135–145)
Total Protein: 5.8 g/dL — ABNORMAL LOW (ref 6.0–8.3)

## 2010-08-02 LAB — PROTEIN ELECTROPHORESIS, SERUM
Albumin ELP: 66.3 % — ABNORMAL HIGH (ref 55.8–66.1)
Alpha-2-Globulin: 12.3 % — ABNORMAL HIGH (ref 7.1–11.8)
Beta Globulin: 6.3 % (ref 4.7–7.2)
M-Spike, %: NOT DETECTED g/dL
Total Protein ELP: 5.9 g/dL — ABNORMAL LOW (ref 6.0–8.3)

## 2010-08-02 LAB — COMPREHENSIVE METABOLIC PANEL
ALT: 46 U/L — ABNORMAL HIGH (ref 0–35)
ALT: 38 U/L — ABNORMAL HIGH (ref 0–35)
AST: 20 U/L (ref 0–37)
Albumin: 4.1 g/dL (ref 3.5–5.2)
BUN: 13 mg/dL (ref 6–23)
CO2: 31 mEq/L (ref 19–32)
CO2: 32 mEq/L (ref 19–32)
Calcium: 8.7 mg/dL (ref 8.4–10.5)
Calcium: 9.7 mg/dL (ref 8.4–10.5)
Chloride: 102 mEq/L (ref 96–112)
Creatinine, Ser: 0.97 mg/dL (ref 0.4–1.2)
Creatinine, Ser: 0.73 mg/dL (ref 0.4–1.2)
GFR calc Af Amer: >60 mL/min (ref >60)
GFR calc non Af Amer: >60 mL/min (ref >60)
Glucose, Bld: 175 mg/dL — ABNORMAL HIGH (ref 70–99)
Sodium: 140 mEq/L (ref 135–145)
Sodium: 138 mEq/L (ref 135–145)
Total Protein: 6 g/dL (ref 6.0–8.3)

## 2010-08-02 LAB — CBC
Hemoglobin: 12.4 g/dL (ref 12.0–15.0)
MCHC: 35.5 g/dL (ref 30.0–36.0)
MCHC: 35.6 g/dL (ref 30.0–36.0)
MCV: 97.2 fL (ref 78.0–100.0)
MCV: 95.6 fL (ref 78.0–100.0)
Platelets: 120 10*3/uL — ABNORMAL LOW (ref 150–400)
RBC: 3.61 MIL/uL — ABNORMAL LOW (ref 3.87–5.11)
RBC: 3.98 MIL/uL (ref 3.87–5.11)
RDW: 15 % (ref 11.5–15.5)
WBC: 6.2 10*3/uL (ref 4.0–10.5)

## 2010-08-02 LAB — IMMUNOFIXATION ELECTROPHORESIS
IgA: 32 mg/dL — ABNORMAL LOW (ref 68–378)
IgG (Immunoglobin G), Serum: 426 mg/dL — ABNORMAL LOW (ref 694–1618)
IgM, Serum: 39 mg/dL — ABNORMAL LOW (ref 60–263)

## 2010-08-02 LAB — DIFFERENTIAL
Lymphocytes Relative: 13 % (ref 12–46)
Lymphs Abs: 0.8 10*3/uL (ref 0.7–4.0)
Monocytes Relative: 6 % (ref 3–12)
Neutro Abs: 5.4 10*3/uL (ref 1.7–7.7)
Neutrophils Relative %: 81 % — ABNORMAL HIGH (ref 43–77)

## 2010-08-03 LAB — COMPREHENSIVE METABOLIC PANEL
ALT: 22 U/L (ref 0–35)
Alkaline Phosphatase: 51 U/L (ref 39–117)
BUN: 13 mg/dL (ref 6–23)
CO2: 31 mEq/L (ref 19–32)
CO2: 26 mEq/L (ref 19–32)
Calcium: 8.7 mg/dL (ref 8.4–10.5)
Chloride: 104 mEq/L (ref 96–112)
Creatinine, Ser: 0.75 mg/dL (ref 0.4–1.2)
GFR calc non Af Amer: >60 mL/min (ref >60)
GFR calc non Af Amer: >60 mL/min (ref >60)
Glucose, Bld: 92 mg/dL (ref 70–99)
Glucose, Bld: 105 mg/dL — ABNORMAL HIGH (ref 70–99)
Potassium: 4.1 mEq/L (ref 3.5–5.1)
Sodium: 135 mEq/L (ref 135–145)
Total Bilirubin: 0.6 mg/dL (ref 0.3–1.2)
Total Protein: 5.7 g/dL — ABNORMAL LOW (ref 6.0–8.3)

## 2010-08-03 LAB — PROTEIN ELECTROPHORESIS, SERUM
Albumin ELP: 63.8 % (ref 55.8–66.1)
Alpha-2-Globulin: 13.2 % — ABNORMAL HIGH (ref 7.1–11.8)
Beta Globulin: 5.8 % (ref 4.7–7.2)
Total Protein ELP: 6.3 g/dL (ref 6.0–8.3)

## 2010-08-03 LAB — PROTEIN ELECTROPH W RFLX QUANT IMMUNOGLOBULINS
Albumin ELP: 60.7 % (ref 55.8–66.1)
Alpha-1-Globulin: 3.3 % (ref 2.9–4.9)
Alpha-2-Globulin: 13.2 % — ABNORMAL HIGH (ref 7.1–11.8)
Gamma Globulin: 15 % (ref 11.1–18.8)

## 2010-08-03 LAB — IMMUNOFIXATION ELECTROPHORESIS
IgA: 458 mg/dL — ABNORMAL HIGH (ref 68–378)
IgM, Serum: 28 mg/dL — ABNORMAL LOW (ref 60–263)
IgM, Serum: 50 mg/dL — ABNORMAL LOW (ref 60–263)

## 2010-08-03 LAB — DIFFERENTIAL
Basophils Absolute: 0 10*3/uL (ref 0.0–0.1)
Basophils Relative: 0 % (ref 0–1)
Eosinophils Absolute: 0 10*3/uL (ref 0.0–0.7)
Eosinophils Relative: 0 % (ref 0–5)
Lymphocytes Relative: 23 % (ref 12–46)
Monocytes Relative: 2 % — ABNORMAL LOW (ref 3–12)
Neutro Abs: 2.6 10*3/uL (ref 1.7–7.7)
Neutrophils Relative %: 65 % (ref 43–77)
Neutrophils Relative %: 95 % — ABNORMAL HIGH (ref 43–77)

## 2010-08-03 LAB — CBC
HCT: 35.7 % — ABNORMAL LOW (ref 36.0–46.0)
Hemoglobin: 14.2 g/dL (ref 12.0–15.0)
MCHC: 35.5 g/dL (ref 30.0–36.0)
MCV: 97.2 fL (ref 78.0–100.0)
RBC: 3.68 MIL/uL — ABNORMAL LOW (ref 3.87–5.11)
RBC: 4.12 MIL/uL (ref 3.87–5.11)
WBC: 4 10*3/uL (ref 4.0–10.5)
WBC: 7.9 10*3/uL (ref 4.0–10.5)

## 2010-08-03 LAB — IGG, IGA, IGM: IgG (Immunoglobin G), Serum: 734 mg/dL (ref 694–1618)

## 2010-08-04 LAB — UIFE/LIGHT CHAINS/TP QN, 24-HR UR
Alpha 1, Urine: NOT DETECTED
Alpha 2, Urine: NOT DETECTED
Beta, Urine: NOT DETECTED
Free Kappa Lt Chains,Ur: 0.71 mg/dL (ref 0.04–1.51)
Free Lambda Lt Chains,Ur: <0.05 mg/dL (ref 0.08–1.01)
Free Lt Chn Excr Rate: 12.96 mg/d
Gamma Globulin, Urine: NOT DETECTED
Time: 24 hours
Total Protein, Urine-Ur/day: 22 mg/d (ref 10–140)
Total Protein, Urine: 1.2 mg/dL
Volume, Urine: 1825 mL

## 2010-08-04 LAB — DIFFERENTIAL
Basophils Absolute: 0 10*3/uL (ref 0.0–0.1)
Basophils Relative: 0 % (ref 0–1)
Eosinophils Absolute: 0.1 10*3/uL (ref 0.0–0.7)
Eosinophils Relative: 1 % (ref 0–5)
Lymphs Abs: 1.7 10*3/uL (ref 0.7–4.0)
Neutrophils Relative %: 65 % (ref 43–77)

## 2010-08-04 LAB — CBC
Hemoglobin: 14.2 g/dL (ref 12.0–15.0)
Hemoglobin: 14.8 g/dL (ref 12.0–15.0)
MCHC: 35.5 g/dL (ref 30.0–36.0)
MCV: 96.3 fL (ref 78.0–100.0)
RBC: 4.22 MIL/uL (ref 3.87–5.11)
RBC: 4.33 MIL/uL (ref 3.87–5.11)
WBC: 6.6 10*3/uL (ref 4.0–10.5)

## 2010-08-04 LAB — COMPREHENSIVE METABOLIC PANEL
ALT: 26 U/L (ref 0–35)
AST: 28 U/L (ref 0–37)
CO2: 32 mEq/L (ref 19–32)
Calcium: 9.6 mg/dL (ref 8.4–10.5)
Chloride: 96 mEq/L (ref 96–112)
GFR calc Af Amer: >60 mL/min (ref >60)
GFR calc non Af Amer: >60 mL/min (ref >60)
Glucose, Bld: 90 mg/dL (ref 70–99)
Sodium: 136 mEq/L (ref 135–145)
Total Bilirubin: 0.4 mg/dL (ref 0.3–1.2)

## 2010-08-04 LAB — BASIC METABOLIC PANEL
CO2: 30 mEq/L (ref 19–32)
Calcium: 9.2 mg/dL (ref 8.4–10.5)
GFR calc Af Amer: >60 mL/min (ref >60)
GFR calc non Af Amer: >60 mL/min (ref >60)
Sodium: 137 mEq/L (ref 135–145)

## 2010-08-04 LAB — IMMUNOFIXATION ELECTROPHORESIS
IgA: 847 mg/dL — ABNORMAL HIGH (ref 68–378)
IgG (Immunoglobin G), Serum: 842 mg/dL (ref 694–1618)
IgM, Serum: 62 mg/dL (ref 60–263)
Total Protein ELP: 7.8 g/dL (ref 6.0–8.3)

## 2010-08-04 LAB — PROTEIN ELECTROPH W RFLX QUANT IMMUNOGLOBULINS
Albumin ELP: 57.9 % (ref 55.8–66.1)
Alpha-1-Globulin: 3 % (ref 2.9–4.9)
Beta 2: 3.3 % (ref 3.2–6.5)
Total Protein ELP: 7.7 g/dL (ref 6.0–8.3)

## 2010-08-04 LAB — TISSUE HYBRIDIZATION (BONE MARROW)-NCBH

## 2010-08-04 LAB — BONE MARROW EXAM

## 2010-08-04 LAB — PROTIME-INR: Prothrombin Time: 13.3 seconds (ref 11.6–15.2)

## 2010-08-04 LAB — CHROMOSOME ANALYSIS, BONE MARROW

## 2010-08-04 LAB — IGG, IGA, IGM: IgM, Serum: 58 mg/dL — ABNORMAL LOW (ref 60–263)

## 2010-08-04 LAB — SEDIMENTATION RATE: Sed Rate: 41 mm/hr — ABNORMAL HIGH (ref 0–22)

## 2010-08-05 LAB — COMPREHENSIVE METABOLIC PANEL
AST: 18 U/L (ref 0–37)
Albumin: 3.7 g/dL (ref 3.5–5.2)
Alkaline Phosphatase: 53 U/L (ref 39–117)
BUN: 16 mg/dL (ref 6–23)
GFR calc Af Amer: >60 mL/min (ref >60)
Potassium: 4 mEq/L (ref 3.5–5.1)
Total Protein: 6.8 g/dL (ref 6.0–8.3)

## 2010-08-05 LAB — DIFFERENTIAL
Basophils Absolute: 0 10*3/uL (ref 0.0–0.1)
Eosinophils Absolute: 0.2 10*3/uL (ref 0.0–0.7)
Eosinophils Relative: 4 % (ref 0–5)
Lymphocytes Relative: 32 % (ref 12–46)
Lymphs Abs: 1.4 10*3/uL (ref 0.7–4.0)
Monocytes Absolute: 0.3 10*3/uL (ref 0.1–1.0)

## 2010-08-05 LAB — CBC
HCT: 37.3 % (ref 36.0–46.0)
Platelets: 207 10*3/uL (ref 150–400)
RDW: 13 % (ref 11.5–15.5)
WBC: 4.2 10*3/uL (ref 4.0–10.5)

## 2010-08-22 ENCOUNTER — Other Ambulatory Visit (HOSPITAL_COMMUNITY): Payer: Self-pay | Admitting: Oncology

## 2010-08-22 DIAGNOSIS — C9000 Multiple myeloma not having achieved remission: Secondary | ICD-10-CM | POA: Insufficient documentation

## 2010-08-25 ENCOUNTER — Encounter (HOSPITAL_COMMUNITY): Payer: 59 | Attending: Oncology

## 2010-08-25 LAB — KAPPA/LAMBDA LIGHT CHAINS: Kappa, lambda light chain ratio: 0.63 (ref 0.26–1.65)

## 2010-08-28 LAB — MULTIPLE MYELOMA PANEL, SERUM
Albumin ELP: 64.5 % (ref 55.8–66.1)
Alpha-1-Globulin: 3 % (ref 2.9–4.9)
IgG (Immunoglobin G), Serum: 934 mg/dL (ref 694–1618)
IgM, Serum: 31 mg/dL — ABNORMAL LOW (ref 60–263)
Total Protein: 6.9 g/dL (ref 6.0–8.3)

## 2010-09-04 ENCOUNTER — Other Ambulatory Visit (HOSPITAL_COMMUNITY): Payer: Self-pay | Admitting: *Deleted

## 2010-09-04 DIAGNOSIS — R52 Pain, unspecified: Secondary | ICD-10-CM

## 2010-09-08 ENCOUNTER — Ambulatory Visit (HOSPITAL_COMMUNITY)
Admission: RE | Admit: 2010-09-08 | Discharge: 2010-09-08 | Disposition: A | Discharge status: Home / Self Care | Payer: 59 | Source: Ambulatory Visit | Attending: Oncology | Admitting: Oncology

## 2010-09-08 DIAGNOSIS — C9000 Multiple myeloma not having achieved remission: Secondary | ICD-10-CM | POA: Insufficient documentation

## 2010-09-08 DIAGNOSIS — M546 Pain in thoracic spine: Secondary | ICD-10-CM | POA: Insufficient documentation

## 2010-09-08 DIAGNOSIS — M5124 Other intervertebral disc displacement, thoracic region: Secondary | ICD-10-CM | POA: Insufficient documentation

## 2010-09-08 DIAGNOSIS — R52 Pain, unspecified: Secondary | ICD-10-CM

## 2010-09-08 MED ORDER — GADOBENATE DIMEGLUMINE 529 MG/ML IV SOLN: 20.0000 mL | Freq: Once | INTRAVENOUS | Status: DC | PRN

## 2010-09-08 MED ORDER — GADOBENATE DIMEGLUMINE 529 MG/ML IV SOLN
13.0000 mL | Freq: Once | INTRAVENOUS | Status: AC | PRN
  Administered 2010-09-08: 13 mL via INTRAVENOUS

## 2010-09-09 NOTE — Op Note (Signed)
Melissa [name], Melissa [name]               ACCOUNT NO.:  [id number]   MEDICAL RECORD NO.:  [id number]          PATIENT TYPE:  AMB   LOCATION:  DAY                           FACILITY:  [facility]   PHYSICIAN:  Kassie Mends, M.D.      DATE OF BIRTH:  1955/08/02   DATE OF PROCEDURE:  07/13/2008  DATE OF DISCHARGE:                               OPERATIVE REPORT   REFERRING Melissa [name]:  Melissa Picket A. Gerda Diss, MD   PROCEDURE:  Colonoscopy.   INDICATION FOR EXAM:  Melissa [name] is a 55 year old female, who presents  for average-risk colon cancer screening.   FINDINGS:  1. Normal colon without evidence of polyps, mass, inflammatory      changes, diverticula or AVMs.  2. Small internal hemorrhoids.  Otherwise, normal retroflexed view of      the rectum.   DIAGNOSES:  Small internal hemorrhoids.   RECOMMENDATIONS:  1. She should follow a high-fiber diet.  She was given a handout on a      high-fiber diet.  2. Screening colonoscopy in 10 years.   MEDICATIONS:  1. Demerol 25 mg IV.  2. Versed 4 mg IV.  3. Phenergan 6.25 mg IV given to prevent post-procedure nausea.   PROCEDURE TECHNIQUE:  Physical exam was performed.  Informed consent was  obtained from the patient after explaining the benefits, risks, and  alternatives to the procedure.  The patient was connected to the monitor  and placed in the left lateral position.  Continuous oxygen was provided  by nasal cannula and IV medicine administered through an indwelling  cannula.  After administration of sedation and rectal exam, the  patient's rectum was intubated  and the scope was advanced under direct visualization to the cecum.  The  scope was removed slowly by carefully examining the color, texture,  anatomy, and integrity of the mucosa on the way out.  The patient was  recovered in endoscopy and discharged home in satisfactory condition.      Kassie Mends, M.D.  Electronically Signed     SM/MEDQ  D:  07/13/2008  T:  07/14/2008  Job:   [id number]   cc:   Melissa Picket A. Gerda Diss, MD  Fax: [phone]

## 2010-09-09 NOTE — Consult Note (Signed)
NAMEVALITA, RIGHTER               ACCOUNT NO.:  [id number]   MEDICAL RECORD NO.:  [id number]          PATIENT TYPE:  OBV   LOCATION:  A332                          FACILITY:  [facility]   PHYSICIAN:  Vickki Hearing, M.D.DATE OF BIRTH:  11-04-1955   DATE OF CONSULTATION:  11/11/2006  DATE OF DISCHARGE:                                 CONSULTATION   CHIEF COMPLAINT:  Back pain.   HISTORY:  This is a 55 year old female born [birth year] admitted  November 10, 2006 after being thrown from a horse. Complained of back pain  and some right buttock pain on presentation.  Pain onset was acute,  severe,  mild radiation into the right buttock but not into the legs,  associated with no bowel or bladder problems.  No sacral sensory  abnormality.  She did have some nausea which was worsened by Dilaudid  that has since now resolved.   MEDICATIONS:  Home medicines are Relafen 1000 mg p.o. daily.   ALLERGIES:  DEMEROL which causes nausea so it is not a true allergy.   PAST MEDICAL HISTORY:  1. History of arthritis.  2. Past history of right buttock discomfort and inability to sit in a      car and ride for long periods without experiencing right buttock      pain.   PAST SURGICAL HISTORY:  She has had a cesarean section, tubal ligation,  tonsillectomy.   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  Denies smoking.  She may have a glass of wine.  No illicit drug use.   FAMILY HISTORY:  Heart disease, diabetes, hypertension.   REVIEW OF SYSTEMS:  She denies fever, blurred vision, loss of hearing,  chest pain, shortness of breath, hematuria, skin abnormalities,  numbness, tingling, depression, anxiety, polydipsia, polyphagia, easy  bruising, bleeding or severe allergies or immunologic disease.  Positive  findings listed in the history and physical.   PHYSICAL EXAMINATION:  VITAL SIGNS:  Temperature 97.6, pulse 94,  respiratory rate 18, blood pressure 118/71.  GENERAL APPEARANCE:  Normal development, nutrition, body habitus.  No  deformities, well-groomed.  PERIPHERAL VASCULAR SYSTEM:  No swelling, palpable pulses, normal  temperature.  No edema, lymph nodes.  CERVICAL:  Deferred.  MUSCULOSKELETAL:  Gait and station.  She was lying flat in bed but she  had been up three times on her own without much discomfort.  Upper  extremities showed no malalignments, no crepitations, contracture, joint  luxation, dislocation, subluxation, or joint laxity.  Normal muscle  strength and muscle tone.  Lower extremity exam the same.  Lumbar exam:  She was tender at the fracture site,  L3 and right buttock.  Skin  integrity was normal in all for extremities as well as the head and neck  and trunk.  Sensory findings were normal.  She was oriented x3.  Mood  and affect were normal.  She had negative stretch test.   IMAGING:  CT of the lumbar spine without contrast shows superior end  plate compression fracture, L3.  No burst component or posterior element  involvement.  Broad-based central disk protrusion,  L2-3, and disk  bulging at L4-5 with left-sided disk protrusion causing probable left L4  nerve root impingement clinically not apparent.  Lumbar spine films 20%  superior end plate compression deformity L3.   DIAGNOSIS:  L3 compression fracture.   PLAN:  Thoracolumbar brace for 6 weeks.  The patient can return to work  on a light duty basis at 4 hours per day if she feels up to it and the  followup will be needed in 6 weeks for further x-rays.      Vickki Hearing, M.D.  Electronically Signed     SEH/MEDQ  D:  11/11/2006  T:  11/12/2006  Job:  [id number]   cc:   Lorin Picket A. Gerda Diss, MD  Fax: [phone]

## 2010-09-09 NOTE — H&P (Signed)
NAMEAUTRY, DROEGE               ACCOUNT NO.:  [id number]   MEDICAL RECORD NO.:  [id number]          PATIENT TYPE:  INP   LOCATION:  3040                         FACILITY:  [facility]   PHYSICIAN:  Hewitt Shorts, M.D.DATE OF BIRTH:  11-27-1955   DATE OF ADMISSION:  10/08/2008  DATE OF DISCHARGE:                              HISTORY & PHYSICAL   HISTORY OF PRESENT ILLNESS:  The patient is a 55 year old right-handed  white female who is admitted and transferred from the [facility]  at [facility] with a diagnosis of a T6 mass with a  pathologic compression fracture with a second mass seen at the T2  lesion.  She has no previous diagnosis of malignancy.  Her symptoms that  presented developed about 5 weeks ago.   The patient explains that 5 weeks ago after driving back and forth from  [place], she developed some discomfort, initially underneath the  shoulder, it gradually worsen.  She used some Advil and continued to  worsen.  She went to the emergency room and had an evaluation done, no  obvious cause of the discomfort was found.  She had increasing pain in  the upper back and into the shoulders bilaterally.  She describes a  crampy shoulder pain.  She had laboratories done on Sep 18, 2008,  including a CBC with differential that showed a white blood cell count  of 4.2, hemoglobin of 13.4, hematocrit of 37.3, and platelet count of  207,000.  A comprehensive metabolic panel that showed normal  electrolytes, normal renal function with a GFR of greater than 60,  normal bilirubin, alkaline phosphatase, SGOT, and SGPT, a total protein  of 6.8, albumin of 3.7, and calcium of 8.9.   Her pain worsened even further about a week and a half ago with  increasingly disabling pain radiating around her chest bilaterally and  increasing pain in the mid back.  She went to her primary physician last  week.  He ordered a MRI of the thoracic spine 3 days ago.  He spoke to  her  about the results and recommended she consult with Dr. Mariel Sleet  whom she saw in the hallways at [facility] today.  He  evaluated her, reviewed the study, and requested neurosurgical  consultation and transfer, the patient was accepted in transfer.   At this time, the patient complains of fairly severe mid back pain that  radiates around to her chest bilaterally.  She describes crampy  bilateral shoulder pain, occasional tingling into the left shoulder.  She is much more comfortable when lying flat and particularly actually  when she is lying on her side.  She denies any symptoms into her lower  extremities.  She denies any lower extremity pain, weakness, numbness,  or paresthesias.  She denies any difficulties with her bladder, but she  has had some constipation which has been worse since restarted on  Vicodin and Skelaxin 4 days ago.  She says that when she tries to have a  bowel movement, she has some rectal pain.   Dr. Mariel Sleet reported to me  a negative mammogram and negative  colonoscopy recently.  She has undergone a recent hepatobiliary study  that apparently was unremarkable as part of the workup of this pain.   The MRI scan reveals a mass replacing much of the T6 vertebra that has  lost at least 50% of height and has resulted in a mild-to-moderate  kyphosis of the mid thoracic spine with relative stenosis in the mid  thoracic spine, but no alteration of cord signal intensity.  There is  also a lesion in the left posterior aspect of the T2 vertebral body  extending to the left T2 pedicle.   The patient was accepted in neurosurgical transfer.   PAST MEDICAL HISTORY:  She denies any history of hypertension,  myocardial infarction, heart disease, cancer (previously), stroke,  diabetes, peptic ulcer disease, or lung disease other than having been  told as a child that she had asthma, but later it was felt to be more  due to allergies.   PAST SURGICAL  HISTORY:  Previous surgery include tonsillectomy and  caesarean section.   ALLERGIES:  She has no allergies to medications, but has an intolerance  to DILAUDID which causes nausea and vomiting.   MEDICATIONS:  Her only medication prior to the recent initiation of  Vicodin and Skelaxin is Synthroid 50 mcg daily and she has been taking  that for the past 2-3 years.   FAMILY HISTORY:  Mother died at age 33 of a myocardial infarction.  Father died at age 77 of a myocardial infarction.   SOCIAL HISTORY:  The patient is married.  She is a Tree surgeon  at [employer].  Previously, worked at [employer].  She has a 67 year old daughter who is in good  health.  She does not smoke.  She has an occasional glass of wine.   REVIEW OF SYSTEMS:  Notable for those described in the history of  present illness and past medical history, but her review of systems,  otherwise, is unremarkable.   PHYSICAL EXAMINATION:  GENERAL:  The patient is a well-developed, well-  nourished white female, while lying in bed, in no acute distress.  LUNGS:  Clear to auscultation.  She has symmetric respiratory excursion.  HEART:  Regular rate and rhythm.  Normal S1 and S2.  There is no murmur.  EXTREMITIES:  No clubbing, cyanosis, or edema.  MUSCULOSKELETAL:  Tenderness to palpation over the mid thoracic region,  none over the upper thoracic region, nor over the lumbar region.  Straight leg raising is negative bilaterally.  NEUROLOGIC:  Shows 5/5 strength to the upper and lower extremities  including the deltoid, biceps, triceps, intrinsic, grip, iliopsoas,  quadriceps, dorsiflexors, extensor hallucis longus, and plantar flexor  bilaterally.  Sensation is intact to pinprick to the upper and lower  extremities.  Reflexes are minimal in the biceps, brachioradialis, and  triceps, 1 in the quadriceps, minimal in gastrocnemius.  They are  symmetrical bilaterally.  Toes are  downgoing bilaterally.  Gait and  stance are not tested due to the nature of her condition.  MENTAL  STATUS:  The patient is awake, alert, and fully oriented.  Speech is  fluent.  She has good comprehension.  Cranial nerves show pupils are  equal, round, and reactive to light about 2 mm bilaterally.  Extraocular  movements are intact.  Facial movement is symmetrical.   IMPRESSION:  The patient is presenting with malignancy with lesion seen  at the T6, larger than at  the T2 level.  At the T6 level, there is  pathologic compression fracture with mild-to-moderate kyphosis of the  thoracic spine centered at the T6 level with relative stenosis at that  level.  However neurologically, she shows intact strength and sensation,  and her pain is relieved by recumbency.   The nature of this lesion is uncertain.  I do feel it represents  malignancy, it may represent a primary bone tumor such as multiple  myeloma.  On the other hand, it may represent a metastasis despite the  reported previously negative mammogram and colonoscopy as well as  unremarkable laboratories and in fact the only laboratory of note is an  elevated sed rate of 41.  However, hemoglobin and hematocrit are 14.8  and 41.7, white blood cell count is 6.6, and platelet count of 262.  Coagulation profile is normal and those laboratories were done today,  but chemistries done 3 weeks ago show normal chemistries, renal  function, liver function, protein status, and calcium.   PLAN:  The patient will be admitted to neurosurgical service.  I have  discussed the case with Dr. Glenford Peers, Dr. Rolm Baptise, and  Dr. Maryclare Bean (from the Interventional Radiology Service), Dr. Bonnielee Haff will  proceed with a biopsy of the T6 vertebra tomorrow for diagnosis.  If  this turns out to be a multiple myeloma, then we may be able to initiate  treatment with radiation therapy and chemotherapy as indicated per Dr.  Truett Perna and Mariel Sleet with possible  posterior stabilization if  necessary at a later date and go ahead.  If this represents a solid  tumor less radiosensitive, then we might consider thoracotomy,  transthoracic corpectomy and reconstruction.   I have discussed all of these with the patient, her husband, and her  daughter, and her brother-in-law, her sister-in-law, and further  recommendations and plan will be pending the results of the needle  biopsy.      Hewitt Shorts, M.D.  Electronically Signed     RWN/MEDQ  D:  10/08/2008  T:  10/09/2008  Job:  [id number]

## 2010-09-09 NOTE — H&P (Signed)
NAMEPAVNEET, MARKWOOD               ACCOUNT NO.:  [id number]   MEDICAL RECORD NO.:  [id number]          PATIENT TYPE:  OBV   LOCATION:  A332                          FACILITY:  [facility]   PHYSICIAN:  Osvaldo Shipper, MD     DATE OF BIRTH:  04/08/1956   DATE OF ADMISSION:  11/10/2006  DATE OF DISCHARGE:  LH                              HISTORY & PHYSICAL   PRIMARY CARE PHYSICIAN:  Scott A. Gerda Diss, M.D.   ADMISSION DIAGNOSES:  1. L3 compression fracture.  2. L2-3 disk herniation with mild mass effect.  3. Nausea and emesis.   CHIEF COMPLAINT:  I got thrown off the horse and landed on the back.   HISTORY OF PRESENT ILLNESS:  Patient is a 55 year old Caucasian female  who just has a history of arthritis, for which she takes Relafen.  The  patient was apparently riding on a horse when suddenly a deer ran in  front of the horse.  The horse got scared, and the patient got thrown  off and landed on her back.  The patient had excruciating pain at that  time, and she came into the ED.  The pain is improved now to about 4/10.  It is a dull-type pain with no radiation.  She is able to move her legs  and is able to ambulate.  She denies any urinary or stool incontinence.  She is now having a lot of nausea, and she had a couple of episodes of  emesis ever since she was given Dilaudid for pain control.  Denies any  abdominal pain.  Denies any dysuria.   HOME MEDICATIONS:  Relafen 1000 mg p.o. daily.   ALLERGIES:  DEMEROL, which causes nausea.   PAST MEDICAL HISTORY:  Positive for arthritis.  No history of any  osteoporosis, heart disease, lung disease, hypertension, or diabetes.   PAST SURGICAL HISTORY:  C-section, tubal ligation, and tonsillectomy in  the past.   SOCIAL HISTORY:  Lives in [place], [place].  Denies smoking.  Occasional wine consumption.  No illicit drug use.  She works in the  [employer] as a Therapist, nutritional.   FAMILY HISTORY:  Positive for unclear heart disease,  diabetes, and  hypertension.   REVIEW OF SYSTEMS:  Positive for back pain, otherwise quite  unremarkable.   PHYSICAL EXAMINATION:  VITAL SIGNS:  Temperature 97.3, blood pressure  127/84, heart rate 100-110, respiratory rate 18, saturation 99% on room  air.  GENERAL:  This is a thin white female, very nauseated at this time but  in no other distress.  HEENT:  There is no pallor, no icterus.  Oral mucous membranes are  moist.  No oral lesions are noted.  NECK:  Soft and supple with no thyromegaly appreciated.  LUNGS:  Clear to auscultation bilaterally.  CARDIOVASCULAR:  S1 and S2.  Slightly tachycardic but regular.  No  murmurs appreciated.  No S3 or S4.  ABDOMEN:  Soft, nontender, nondistended.  Bowel sounds are present.  No  mass or organomegaly is appreciated.  EXTREMITIES:  Without edema.  Peripheral pulses are palpable.  BACK:  Examination of the back revealed point tenderness between the L2-  4 regions.  NEUROLOGIC:  Specifically, in the lower extremities, there is 5/5  strength bilaterally, possibly 2+ reflexes in the patellar area  bilaterally.  Otherwise, no neurological deficits are present.   No labs have been done.   She did have an x-ray of the lumbosacral spine, which revealed a 20%  superior end plate compression deformity at L3.   A CT of the head was done subsequently, which showed an L3 superior end  plate compression fracture.  No burst component or involvement elements  was noted.  No other fractures noted.  There was a central disk  herniation of the L2-3 with mild mass effect on the thecal sac.  Probable left foraminal disk herniation of L4-5 with questionable left  nerve radiculopathy.  There is also probable 1 cm left adrenal adenoma  noted incidentally on the CT.   ASSESSMENT/PLAN:  A 55 year old Caucasian female with arthritis who is  presenting with back pain after she was thrown off a horse.  She has an  L3 compression fracture with mild disk  herniation.  No evidence for any  neurological deficits at this time.  She will need to be observed in the  hospital for pain control, nausea control, and to be evaluated by  physical therapy and by an orthopedic surgeon.  Most likely, this  patient will just need to have a back brace for the fracture.  Patient  is requesting to be seen by Dr. Romeo Apple, and we will request a  consultation with him tomorrow.  I think once her nausea is controlled  and her pain is controlled, she can potentially go home by tomorrow  evening.  Continue the Relafen for now.  Will just get some baseline  labs to make sure there are no other significant abnormalities.  Check a  UA as well.   DVT prophylaxis will be initiated.   Further management decision will be based on the results of initial  testing and patient's response to treatment.   Please note I will be admitting for Dr. Gerda Diss tonight, as he is not on  call.  Dr. Gerda Diss will take over the care of this patient tomorrow  morning at 7:00.      Osvaldo Shipper, MD  Electronically Signed     GK/MEDQ  D:  11/11/2006  T:  11/11/2006  Job:  [id number]   cc:   Lorin Picket A. Gerda Diss, MD  Fax: [phone]

## 2010-09-09 NOTE — Consult Note (Signed)
NAMESARAIAH, [name]               ACCOUNT NO.:  [id number]   MEDICAL RECORD NO.:  [id number]          PATIENT TYPE:  INP   LOCATION:  1311                         [facility]ACILITY:  [facility]   PHYSICIAN:  Leighton Roach. Truett Perna, M.D. DATE OF BIRTH:  05/23/1955   DATE OF CONSULTATION:  10/09/2008  DATE OF DISCHARGE:                                 CONSULTATION   REFERRING PHYSICIAN:  Hewitt Shorts, MD   PATIENT IDENTIFICATION:  Melissa [name] is a 55 year old with a pathologic  compression fracture at T6.   HISTORY OF PRESENT ILLNESS:  Melissa [name] reports a 5- to 6-week history  of discomfort at the right upper back.  The pain has progressed over the  past few weeks and she now has pain at the left upper back and anterior  chest.  The pain is worse with movement.   She underwent a negative abdominal ultrasound on Sep 18, 2008, and a  negative nuclear biliary study on September 28, 2008.  On October 05, 2008, Dr.  Gerda Diss obtain an MRI of the thoracic spine.  This was remarkable for a  compression fracture at T6 with marked marrow edema.  Bony retropulsion  was noted in the spinal canal.  Marrow signal abnormality extended into  the epidural space.  Moderate-to-severe central canal stenosis was noted  at T6, worse on the right.  A second focus of marrow signal abnormality  was noted at T2 vertebral body on the left consistent with a focus of  neoplasm.   She was admitted by Dr. Newell Coral on October 08, 2008, for further  diagnostic evaluation.  She underwent a CT-guided biopsy of the T6  lesion earlier today.   PAST MEDICAL HISTORY:  1. Hypothyroidism.  2. G3, P1, 2 miscarriages at 6-7 weeks.   PAST SURGICAL HISTORY:  1. Cesarean section.  2. Tonsillectomy at age 32.  69. D and C following a miscarriage.   FAMILY HISTORY:  No family history of cancer.   CURRENT MEDICATIONS:  1. Senokot 1 nightly.  2. Tylenol 325-650 mg q.4 h. p.r.n.  3. Vicodin 5/325 q.4 h. p.r.n.  4. Ambien 10 mg nightly p.r.n.  5. Hydroxyzine 50 mg q.3 h. p.r.n.   ALLERGIES:  DEMEROL.   SOCIAL HISTORY:  She is the Tree surgeon at [employer].  She  lives with her husband in [place].  She does not use tobacco.  She drinks  wine occasionally.  She has no transfusion history.  She denies risk  factors for HIV and hepatitis.   REVIEW OF SYSTEMS:  CONSTITUTIONAL:  She denies fever, night sweats, and  anorexia/weight loss.  She has intermittent hot flashes.  RESPIRATORY:  Negative.  CARDIAC:  Negative.  GU:  Negative.  GI:  She reports  constipation.  NEUROLOGIC:  No focal numbness or weakness.  No  difficulty with bowel or bladder control.  SKIN:  She recently had a  tick bite.  INFECTIOUS DISEASE:  Negative.  HEMATOLOGIC:  Negative.   PHYSICAL EXAMINATION:  VITAL SIGNS:  Pressure 119/72, pulse 77,  temperature 97.4, oxygen saturation 99% on room air.  HEENT:  Oropharynx without visible mass or lesion.  LUNGS:  Clear bilaterally.  CARDIAC:  Regular rhythm.  ABDOMEN:  No hepatosplenomegaly.  EXTREMITIES:  No edema.  BREASTS:  Bilateral breast without mass.  LYMPH NODES:  There is a 1.5- to 2-cm right posterior cervical node (she  reports this has been present chronically).  No other palpable cervical,  supraclavicular, axillary, or inguinal nodes.  NEUROLOGIC:  She is alert and oriented.  The motor exam appears grossly  intact throughout.  Sensation is intact to light touch.  Good leg  strength bilaterally.   LABORATORY DATA:  Hemoglobin 14.8, platelets 262,000, white count 6.6,  ANC 4.3, absolute lymphocyte count 1.7.  PT 13.3, PTT 25.  Erythrocyte  sedimentation rate 41.  BUN 70, creatinine 0.91, potassium 3.4, total  protein 8.1, albumin 4.3, calcium 9.6.   IMPRESSION:  1. 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  2. Apparent second malignant lesion at T2.  3. Back/chest pain secondary to the T6 compression fracture and      epidural tumor.  4. Elevated erythrocyte sedimentation  rate.   Melissa [name] is admitted with severe back pain related to a destructive  lesion at T6.   She will undergo a diagnostic biopsy of the T6 vertebra today.  The  differential diagnosis includes multiple myeloma, another hematopoietic  malignancy, or metastatic disease from a primary tumor site.   We will follow up on the pathology from the biopsy today and make  additional diagnostic and treatment recommendations.  We will obtain an  radiation oncology consultation and begin Decadron therapy as indicated.   She plans to continue medical oncology care with Dr. Mariel Sleet as an  outpatient.      Leighton Roach Truett Perna, M.D.  Electronically Signed     GBS/MEDQ  D:  10/09/2008  T:  10/10/2008  Job:  [id number]   cc:   Hewitt Shorts, M.D.  Ladona Horns. Mariel Sleet, MD  Scott A. Gerda Diss, MD

## 2010-09-09 NOTE — Discharge Summary (Signed)
Melissa [name]               ACCOUNT NO.:  [id number]   MEDICAL RECORD NO.:  [id number]          PATIENT TYPE:  INP   LOCATION:  1311                         FACILITY:  [facility]   PHYSICIAN:  Leighton Roach. Truett Perna, M.D. DATE OF BIRTH:  02-08-1956   DATE OF ADMISSION:  10/08/2008  DATE OF DISCHARGE:  10/12/2008                               DISCHARGE SUMMARY   CONDITION ON DISCHARGE:  Improved.   DISCHARGE DIAGNOSES:  1. Multiple myeloma.      a.     Status post a biopsy of a T6 pathologic compression fracture       on June 15 with the pathology consistent with a plasmacytoma.      b.     Serum immunofixation confirming an IgA kappa monoclonal       protein.      c.     Metastatic bone survey confirming a mild to moderate L3       compression fracture, a T6 pathologic compression fracture and no       other evidence for myeloma involvement.  2. Pain secondary to be pathologic T6 compression fracture improved      with Decadron.   HOSPITAL CONSULTATIONS:  1. Dr. Newell Coral, Neurosurgery.  2. Dr. Kathrynn Running, Radiation Oncology.   HOSPITAL PROCEDURES.:  1. MRI of the lumbar spine.  2. Bone marrow biopsy.  3. Radiation therapy.   HOSPITAL COURSE:  Melissa [name] is a 55 year old with a history of  back/chest wall pain progressing over the month prior to this hospital  admission.   An MRI of the spine prior to hospital admission revealed a mass  replacing T6 with a pathologic fracture and spinal canal stenosis.  A  lesion was also noted at T2.   She was admitted by Dr. Newell Coral on June 14 for pain control and further  evaluation.   She underwent a biopsy of T6 in Interventional Radiology on June 14.  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   A serum immunofixation confirmed an IgA kappa monoclonal protein.  Additional staging evaluations for multiple myeloma are pending at the  time of discharge including a 24-hour urine and bone marrow biopsy.  A  bone survey revealed an  L3 compression fracture in addition to the  fracture at T6.  The L3 compression fracture is most likely chronic.  An  MRI of the lumbar spine on June 16 revealed a 50% superior endplate  fracture at L3.  Metastatic disease cannot be excluded.  Tiny areas of  abnormal signal more noted at L1, L4, and S1 felt to potentially  represent small metastatic lesions.   She was placed on Decadron and the pain was much improved.  Dr. Kathrynn Running  was consulted and she began radiation to the thoracic spine on June 16.  She completed three fractions of radiation prior to discharge.   She underwent diagnostic bone marrow biopsy on June 18.  The result of  this biopsy is pending at the time of discharge.   She had no neurologic symptoms throughout this admission.   She appears stable for discharge on  June 18.   DISCHARGE MEDICATIONS:  1. Decadron 4 mg b.i.d.  2. Protonix 40 mg daily.  3. Synthroid 50 mcg daily.  4. Vicodin 5/500, one to two q.4 h p.r.n. pain.   FOLLOWUP:  Follow-up care will be with Dr. Kathrynn Running for Radiation  beginning on June 21.  A follow-up appointment will be arranged with Dr.  Mariel Sleet for the week of June 21.      Leighton Roach Truett Perna, M.D.  Electronically Signed     GBS/MEDQ  D:  10/19/2008  T:  10/19/2008  Job:  [id number]   cc:   Ladona Horns. Mariel Sleet, MD  Fax: [phone]   Artist Pais. Kathrynn Running, M.D.  Fax: [phone]   Hewitt Shorts, M.D.  Fax: [phone]   Scott A. Gerda Diss, MD  Fax: [phone]

## 2010-09-11 ENCOUNTER — Other Ambulatory Visit (HOSPITAL_COMMUNITY): Payer: Self-pay | Admitting: Oncology

## 2010-09-11 DIAGNOSIS — IMO0002 Reserved for concepts with insufficient information to code with codable children: Secondary | ICD-10-CM

## 2010-09-12 ENCOUNTER — Ambulatory Visit (HOSPITAL_COMMUNITY): Payer: 59

## 2010-09-12 NOTE — Procedures (Signed)
   NAME:  Melissa [name], Melissa [name]                         ACCOUNT NO.:  [id number]   MEDICAL RECORD NO.:  [id number]                   PATIENT TYPE:  OUT   LOCATION:  DFTL                                 FACILITY:  [facility]   PHYSICIAN:  Scott A. Gerda Diss, M.D.               DATE OF BIRTH:  [birth year]   DATE OF PROCEDURE:  07/13/2002  DATE OF DISCHARGE:                                    STRESS TEST   PROCEDURE:  Stress test.   PROTOCOL:  Bruce.   INDICATIONS:  Chest discomfort.   EXERCISE PHASE:  The patient had no problems during exercise.  The heart  rate gradually increased.  She reached a target heart rate at the end of  stage 2; progressed through stage 3, keeping in the 140s; into stage 4 went  into the 160s; maximum of 162.  The patient did not experience any chest  tightness, pressure, pain or shortness of breath.   ST SEGMENT RESPONSES:  Exercise:  There were no acute ST segment changes  noted during exercise or recovery phase.  All changes were at a wavering  baseline, always upsloping, never any ST segment depression.   Therefore, the patient's test was ended after several minutes of rest phase.  Her blood pressure came back down. She did not have an exaggerated blood  pressure response to exercise.  It is felt that the patient was stable for  being unhooked.   INTERPRETATION:  Negative stress test.  She was instructed to take Prilosec  on a daily basis for a month and let us know how that does for chest  discomfort, that may well be gastroesophageal reflux disease related.                                               Scott A. Gerda Diss, M.D.    SAL/MEDQ  D:  07/13/2002  T:  07/13/2002  Job:  [id number]

## 2010-09-16 ENCOUNTER — Ambulatory Visit (HOSPITAL_COMMUNITY)
Admission: RE | Admit: 2010-09-16 | Discharge: 2010-09-16 | Disposition: A | Discharge status: Home / Self Care | Payer: 59 | Source: Ambulatory Visit | Attending: Oncology | Admitting: Oncology

## 2010-09-16 DIAGNOSIS — IMO0002 Reserved for concepts with insufficient information to code with codable children: Secondary | ICD-10-CM

## 2010-09-18 ENCOUNTER — Ambulatory Visit (HOSPITAL_COMMUNITY): Payer: 59

## 2010-10-10 ENCOUNTER — Ambulatory Visit (HOSPITAL_COMMUNITY): Payer: Self-pay | Admitting: Oncology

## 2010-10-13 ENCOUNTER — Ambulatory Visit (HOSPITAL_COMMUNITY): Payer: Self-pay | Admitting: Oncology

## 2010-10-13 ENCOUNTER — Encounter (HOSPITAL_COMMUNITY): Payer: 59 | Attending: Oncology | Admitting: Oncology

## 2010-10-13 DIAGNOSIS — C9000 Multiple myeloma not having achieved remission: Secondary | ICD-10-CM

## 2010-10-24 ENCOUNTER — Other Ambulatory Visit (HOSPITAL_COMMUNITY): Payer: Self-pay | Admitting: Oncology

## 2010-10-24 ENCOUNTER — Encounter (HOSPITAL_COMMUNITY): Payer: 59

## 2010-10-24 DIAGNOSIS — C9000 Multiple myeloma not having achieved remission: Secondary | ICD-10-CM

## 2010-10-24 LAB — COMPREHENSIVE METABOLIC PANEL
ALT: 15 U/L (ref 0–35)
AST: 19 U/L (ref 0–37)
CO2: 31 mEq/L (ref 19–32)
Calcium: 9.3 mg/dL (ref 8.4–10.5)
Chloride: 104 mEq/L (ref 96–112)
GFR calc non Af Amer: 57 mL/min — ABNORMAL LOW (ref >60)
Potassium: 3.8 mEq/L (ref 3.5–5.1)
Sodium: 139 mEq/L (ref 135–145)

## 2010-10-24 LAB — DIFFERENTIAL
Basophils Absolute: 0 10*3/uL (ref 0.0–0.1)
Eosinophils Relative: 2 % (ref 0–5)
Lymphocytes Relative: 32 % (ref 12–46)
Neutro Abs: 1.7 10*3/uL (ref 1.7–7.7)
Neutrophils Relative %: 51 % (ref 43–77)

## 2010-10-24 LAB — CBC
Platelets: 168 10*3/uL (ref 150–400)
RDW: 13.8 % (ref 11.5–15.5)
WBC: 3.3 10*3/uL — ABNORMAL LOW (ref 4.0–10.5)

## 2010-10-27 LAB — KAPPA/LAMBDA LIGHT CHAINS: Kappa, lambda light chain ratio: 0.85 (ref 0.26–1.65)

## 2010-10-28 LAB — MULTIPLE MYELOMA PANEL, SERUM
Alpha-1-Globulin: 3.5 % (ref 2.9–4.9)
Alpha-2-Globulin: 10.9 % (ref 7.1–11.8)
Gamma Globulin: 14.7 % (ref 11.1–18.8)
IgA: 73 mg/dL (ref 69–380)
IgG (Immunoglobin G), Serum: 991 mg/dL (ref 690–1700)

## 2010-12-03 ENCOUNTER — Other Ambulatory Visit: Payer: Self-pay | Admitting: Obstetrics and Gynecology

## 2010-12-03 DIAGNOSIS — Z139 Encounter for screening, unspecified: Secondary | ICD-10-CM

## 2010-12-05 ENCOUNTER — Other Ambulatory Visit (HOSPITAL_COMMUNITY): Payer: Self-pay | Admitting: Oncology

## 2010-12-05 ENCOUNTER — Encounter (HOSPITAL_COMMUNITY): Payer: 59 | Attending: Oncology

## 2010-12-05 ENCOUNTER — Encounter (HOSPITAL_BASED_OUTPATIENT_CLINIC_OR_DEPARTMENT_OTHER): Payer: 59

## 2010-12-05 DIAGNOSIS — C9000 Multiple myeloma not having achieved remission: Secondary | ICD-10-CM

## 2010-12-05 LAB — CBC
MCHC: 34.4 g/dL (ref 30.0–36.0)
Platelets: 190 10*3/uL (ref 150–400)
RDW: 13.4 % (ref 11.5–15.5)
WBC: 2.7 10*3/uL — ABNORMAL LOW (ref 4.0–10.5)

## 2010-12-05 LAB — COMPREHENSIVE METABOLIC PANEL
ALT: 13 U/L (ref 0–35)
AST: 19 U/L (ref 0–37)
Albumin: 3.8 g/dL (ref 3.5–5.2)
CO2: 27 mEq/L (ref 19–32)
Calcium: 9.2 mg/dL (ref 8.4–10.5)
Sodium: 141 mEq/L (ref 135–145)
Total Protein: 7.1 g/dL (ref 6.0–8.3)

## 2010-12-05 LAB — DIFFERENTIAL
Basophils Absolute: 0 10*3/uL (ref 0.0–0.1)
Basophils Relative: 2 % — ABNORMAL HIGH (ref 0–1)
Eosinophils Relative: 3 % (ref 0–5)
Lymphocytes Relative: 32 % (ref 12–46)
Neutro Abs: 1.3 10*3/uL — ABNORMAL LOW (ref 1.7–7.7)

## 2010-12-05 MED ORDER — ZOLEDRONIC ACID 4 MG/5ML IV CONC
4.0000 mg | Freq: Once | INTRAVENOUS | Status: AC
  Administered 2010-12-05: 4 mg via INTRAVENOUS
  Filled 2010-12-05: qty 5

## 2010-12-05 MED ORDER — SODIUM CHLORIDE 0.9 % IV SOLN
INTRAVENOUS | Status: AC
  Administered 2010-12-05: 10:00:00 via INTRAVENOUS

## 2010-12-05 NOTE — Progress Notes (Signed)
Labs drawn today for cbc/diff,cmp,mm panel,kllc No venipuncture charge drawn in lab diwnstairs

## 2010-12-08 LAB — KAPPA/LAMBDA LIGHT CHAINS: Kappa, lambda light chain ratio: 0.84 (ref 0.26–1.65)

## 2010-12-10 LAB — MULTIPLE MYELOMA PANEL, SERUM
Albumin ELP: 62.3 % (ref 55.8–66.1)
Alpha-1-Globulin: 3.1 % (ref 2.9–4.9)
Alpha-2-Globulin: 11.4 % (ref 7.1–11.8)
IgA: 78 mg/dL (ref 69–380)
IgG (Immunoglobin G), Serum: 1020 mg/dL (ref 690–1700)
Total Protein: 6.8 g/dL (ref 6.0–8.3)

## 2010-12-11 ENCOUNTER — Ambulatory Visit (HOSPITAL_COMMUNITY): Payer: 59

## 2010-12-11 ENCOUNTER — Ambulatory Visit (HOSPITAL_COMMUNITY)
Admission: RE | Admit: 2010-12-11 | Discharge: 2010-12-11 | Disposition: A | Discharge status: Home / Self Care | Payer: 59 | Source: Ambulatory Visit | Attending: Obstetrics and Gynecology | Admitting: Obstetrics and Gynecology

## 2010-12-11 DIAGNOSIS — Z1231 Encounter for screening mammogram for malignant neoplasm of breast: Secondary | ICD-10-CM | POA: Insufficient documentation

## 2010-12-11 DIAGNOSIS — Z139 Encounter for screening, unspecified: Secondary | ICD-10-CM

## 2010-12-11 NOTE — Progress Notes (Signed)
Labs from 8/10 faxed to Dr. Greggory Stallion

## 2011-01-05 ENCOUNTER — Ambulatory Visit (HOSPITAL_COMMUNITY): Payer: 59 | Admitting: Oncology

## 2011-01-15 ENCOUNTER — Other Ambulatory Visit (HOSPITAL_COMMUNITY): Payer: Self-pay | Admitting: Oncology

## 2011-01-15 ENCOUNTER — Encounter (HOSPITAL_COMMUNITY): Payer: 59 | Attending: Oncology

## 2011-01-15 ENCOUNTER — Encounter (HOSPITAL_BASED_OUTPATIENT_CLINIC_OR_DEPARTMENT_OTHER): Payer: 59

## 2011-01-15 DIAGNOSIS — C9000 Multiple myeloma not having achieved remission: Secondary | ICD-10-CM

## 2011-01-15 LAB — CBC
HCT: 38.7 % (ref 36.0–46.0)
Hemoglobin: 13.7 g/dL (ref 12.0–15.0)
MCH: 35.1 pg — ABNORMAL HIGH (ref 26.0–34.0)
MCHC: 35.4 g/dL (ref 30.0–36.0)
MCV: 99.2 fL (ref 78.0–100.0)
RDW: 12.8 % (ref 11.5–15.5)

## 2011-01-15 LAB — COMPREHENSIVE METABOLIC PANEL
Albumin: 4 g/dL (ref 3.5–5.2)
Alkaline Phosphatase: 60 U/L (ref 39–117)
BUN: 18 mg/dL (ref 6–23)
Creatinine, Ser: 0.89 mg/dL (ref 0.50–1.10)
GFR calc Af Amer: >60 mL/min (ref >60)
Glucose, Bld: 71 mg/dL (ref 70–99)
Total Protein: 6.8 g/dL (ref 6.0–8.3)

## 2011-01-15 LAB — DIFFERENTIAL
Basophils Relative: 1 % (ref 0–1)
Eosinophils Absolute: 0.1 10*3/uL (ref 0.0–0.7)
Lymphs Abs: 1.2 10*3/uL (ref 0.7–4.0)
Monocytes Relative: 13 % — ABNORMAL HIGH (ref 3–12)
Neutro Abs: 2.3 10*3/uL (ref 1.7–7.7)
Neutrophils Relative %: 54 % (ref 43–77)

## 2011-01-15 MED ORDER — ZOLEDRONIC ACID 4 MG/5ML IV CONC
4.0000 mg | Freq: Once | INTRAVENOUS | Status: AC
  Administered 2011-01-15: 4 mg via INTRAVENOUS
  Filled 2011-01-15: qty 5

## 2011-01-15 MED ORDER — SODIUM CHLORIDE 0.9 % IJ SOLN
INTRAMUSCULAR | Status: AC
  Administered 2011-01-15: 10 mL
  Filled 2011-01-15: qty 10

## 2011-01-15 NOTE — Progress Notes (Signed)
zometa 4 mg iv given today. Patient tolerated well. Labs wnl. IV site WNL.

## 2011-01-15 NOTE — Progress Notes (Signed)
Labs drawn today for cbc/diff,cmp,kllc,mm panel 

## 2011-01-16 ENCOUNTER — Encounter (HOSPITAL_BASED_OUTPATIENT_CLINIC_OR_DEPARTMENT_OTHER): Payer: 59 | Admitting: Oncology

## 2011-01-16 ENCOUNTER — Encounter (HOSPITAL_COMMUNITY): Payer: Self-pay | Admitting: Oncology

## 2011-01-16 DIAGNOSIS — C9000 Multiple myeloma not having achieved remission: Secondary | ICD-10-CM

## 2011-01-16 LAB — KAPPA/LAMBDA LIGHT CHAINS
Kappa free light chain: 1.5 mg/dL (ref 0.33–1.94)
Kappa, lambda light chain ratio: 0.99 (ref 0.26–1.65)
Lambda free light chains: 1.52 mg/dL (ref 0.57–2.63)

## 2011-01-16 NOTE — Progress Notes (Signed)
CC:   Melissa [name]. Greggory Stallion, M.D. Scott A. Gerda Diss, MD Hewitt Shorts, M.D.  DIAGNOSES: 1. Stage IIIA IgA kappa multiple myeloma.  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 2. T6 collapsed vertebral body, which is how she presented with pain.     She has had radiation therapy, but is not felt to be a candidate     according to Dr. Corliss Skains for vertebroplasty at this time. 3. Hypokalemia in the past that has resolved. 4. Grade 1 peripheral neuropathy, much improved. 5. Revlimid-induced neutropenia and she has decided to take the     Revlimid only 5 mg 7 days on and 7 days off.  She also states that     it made her feel not as good when she was on it daily, just quality     of life was markedly diminished.  She is working full-time, feels great.  Her labs from the other day thus far are perfectly normal.  There is no M-spike detected.  She is not aware of anything new or different.  She is fully functional.  No bowel problems, breathing problems, abdominal problems, neurological problems, etc.  Vital signs are very good today.  The only thing that is still pending are the light chains and her IgM level.  Again there was no M-spike detected on the peripheral blood thus far.  Her CBC is actually totally normal, including white count, platelets, and red cells.  Her CMET is totally normal.  We will therefore see her in January since she is going to see Dr. Greggory Stallion in November.  He will administer a pneumonia vaccine at that time.  We will continue Zometa every 6 weeks and her labs every 6 weeks.  She is fine with this plan.    ______________________________ Ladona Horns. Mariel Sleet, MD ESN/MEDQ  D:  01/16/2011  T:  01/16/2011  Job:  [id number]

## 2011-01-16 NOTE — Progress Notes (Signed)
This office note has been dictated.

## 2011-01-20 LAB — MULTIPLE MYELOMA PANEL, SERUM
Albumin ELP: 61.8 % (ref 55.8–66.1)
Alpha-1-Globulin: 3.1 % (ref 2.9–4.9)
Beta 2: 4.3 % (ref 3.2–6.5)
Gamma Globulin: 14.3 % (ref 11.1–18.8)
IgG (Immunoglobin G), Serum: 1090 mg/dL (ref 690–1700)

## 2011-02-09 LAB — CBC
HCT: 36.3
Hemoglobin: 12.7
MCHC: 35
MCV: 93.9
RDW: 12.4

## 2011-02-09 LAB — BASIC METABOLIC PANEL
BUN: 10
CO2: 29
Chloride: 108
Glucose, Bld: 99
Potassium: 4.3
Sodium: 142

## 2011-02-09 LAB — DIFFERENTIAL
Basophils Absolute: 0
Basophils Relative: 0
Eosinophils Absolute: 0
Eosinophils Relative: 0
Lymphocytes Relative: 10 — ABNORMAL LOW
Monocytes Absolute: 0.3

## 2011-02-09 LAB — URINALYSIS, ROUTINE W REFLEX MICROSCOPIC
Bilirubin Urine: NEGATIVE
Hgb urine dipstick: NEGATIVE
Ketones, ur: NEGATIVE
Specific Gravity, Urine: 1.01
pH: 7.5

## 2011-02-26 ENCOUNTER — Other Ambulatory Visit (HOSPITAL_COMMUNITY): Payer: 59

## 2011-02-26 ENCOUNTER — Encounter (HOSPITAL_COMMUNITY): Payer: 59 | Attending: Oncology

## 2011-02-26 ENCOUNTER — Encounter (HOSPITAL_BASED_OUTPATIENT_CLINIC_OR_DEPARTMENT_OTHER): Payer: 59

## 2011-02-26 DIAGNOSIS — C9000 Multiple myeloma not having achieved remission: Secondary | ICD-10-CM

## 2011-02-26 LAB — COMPREHENSIVE METABOLIC PANEL
ALT: 17 U/L (ref 0–35)
Alkaline Phosphatase: 56 U/L (ref 39–117)
CO2: 32 mEq/L (ref 19–32)
GFR calc Af Amer: 71 mL/min — ABNORMAL LOW (ref >90)
GFR calc non Af Amer: 62 mL/min — ABNORMAL LOW (ref >90)
Glucose, Bld: 59 mg/dL — ABNORMAL LOW (ref 70–99)
Potassium: 4.1 mEq/L (ref 3.5–5.1)
Sodium: 141 mEq/L (ref 135–145)
Total Bilirubin: 0.4 mg/dL (ref 0.3–1.2)

## 2011-02-26 LAB — DIFFERENTIAL
Basophils Absolute: 0 10*3/uL (ref 0.0–0.1)
Basophils Relative: 1 % (ref 0–1)
Eosinophils Relative: 3 % (ref 0–5)
Monocytes Absolute: 0.5 10*3/uL (ref 0.1–1.0)
Neutro Abs: 1.6 10*3/uL — ABNORMAL LOW (ref 1.7–7.7)

## 2011-02-26 LAB — CBC
HCT: 38.8 % (ref 36.0–46.0)
MCHC: 34.5 g/dL (ref 30.0–36.0)
Platelets: 189 10*3/uL (ref 150–400)
RDW: 13.3 % (ref 11.5–15.5)

## 2011-02-26 MED ORDER — SODIUM CHLORIDE 0.9 % IV SOLN
INTRAVENOUS | Status: AC
  Administered 2011-02-26: 15:00:00 via INTRAVENOUS

## 2011-02-26 MED ORDER — ZOLEDRONIC ACID 4 MG/5ML IV CONC
4.0000 mg | Freq: Once | INTRAVENOUS | Status: AC
  Administered 2011-02-26: 4 mg via INTRAVENOUS
  Filled 2011-02-26: qty 5

## 2011-02-26 MED ORDER — SODIUM CHLORIDE 0.9 % IJ SOLN
10.0000 mL | INTRAMUSCULAR | Status: AC | PRN
  Administered 2011-02-26: 10 mL via INTRAVENOUS
  Filled 2011-02-26: qty 10

## 2011-02-26 NOTE — Progress Notes (Signed)
Labs drawn today for cbc/diff,cmp,kllc,mm panel 

## 2011-02-27 LAB — KAPPA/LAMBDA LIGHT CHAINS: Kappa, lambda light chain ratio: 1.41 (ref 0.26–1.65)

## 2011-03-02 LAB — MULTIPLE MYELOMA PANEL, SERUM
Alpha-2-Globulin: 11.8 % (ref 7.1–11.8)
Beta Globulin: 5.6 % (ref 4.7–7.2)
Gamma Globulin: 16.7 % (ref 11.1–18.8)
M-Spike, %: NOT DETECTED g/dL

## 2011-03-26 ENCOUNTER — Ambulatory Visit (HOSPITAL_COMMUNITY): Payer: 59

## 2011-04-09 ENCOUNTER — Other Ambulatory Visit (HOSPITAL_COMMUNITY): Payer: Self-pay | Admitting: Oncology

## 2011-04-09 ENCOUNTER — Encounter (HOSPITAL_COMMUNITY): Payer: 59

## 2011-04-09 ENCOUNTER — Encounter (HOSPITAL_COMMUNITY): Payer: 59 | Attending: Oncology

## 2011-04-09 DIAGNOSIS — C9000 Multiple myeloma not having achieved remission: Secondary | ICD-10-CM | POA: Insufficient documentation

## 2011-04-09 LAB — COMPREHENSIVE METABOLIC PANEL
ALT: 16 U/L (ref 0–35)
Alkaline Phosphatase: 53 U/L (ref 39–117)
CO2: 35 mEq/L — ABNORMAL HIGH (ref 19–32)
Calcium: 9.9 mg/dL (ref 8.4–10.5)
Chloride: 100 mEq/L (ref 96–112)
GFR calc Af Amer: 79 mL/min — ABNORMAL LOW (ref >90)
GFR calc non Af Amer: 68 mL/min — ABNORMAL LOW (ref >90)
Glucose, Bld: 81 mg/dL (ref 70–99)
Sodium: 140 mEq/L (ref 135–145)
Total Bilirubin: 0.4 mg/dL (ref 0.3–1.2)

## 2011-04-09 LAB — CBC
HCT: 37.8 % (ref 36.0–46.0)
MCV: 100.3 fL — ABNORMAL HIGH (ref 78.0–100.0)
Platelets: 199 10*3/uL (ref 150–400)
RBC: 3.77 MIL/uL — ABNORMAL LOW (ref 3.87–5.11)
WBC: 2.6 10*3/uL — ABNORMAL LOW (ref 4.0–10.5)

## 2011-04-09 LAB — DIFFERENTIAL
Eosinophils Relative: 7 % — ABNORMAL HIGH (ref 0–5)
Lymphocytes Relative: 34 % (ref 12–46)
Lymphs Abs: 0.9 10*3/uL (ref 0.7–4.0)

## 2011-04-09 MED ORDER — ZOLEDRONIC ACID 4 MG/5ML IV CONC
4.0000 mg | Freq: Once | INTRAVENOUS | Status: AC
  Administered 2011-04-09: 4 mg via INTRAVENOUS
  Filled 2011-04-09: qty 5

## 2011-04-09 MED ORDER — SODIUM CHLORIDE 0.9 % IV SOLN
INTRAVENOUS | Status: DC
  Administered 2011-04-09: 15:00:00 via INTRAVENOUS

## 2011-04-09 MED ORDER — SODIUM CHLORIDE 0.9 % IJ SOLN
10.0000 mL | INTRAMUSCULAR | Status: DC | PRN
  Administered 2011-04-09: 10 mL via INTRAVENOUS
  Filled 2011-04-09: qty 10

## 2011-04-09 MED ORDER — SODIUM CHLORIDE 0.9 % IJ SOLN
INTRAMUSCULAR | Status: AC
  Administered 2011-04-09: 10 mL via INTRAVENOUS
  Filled 2011-04-09: qty 10

## 2011-04-13 LAB — MULTIPLE MYELOMA PANEL, SERUM
Alpha-2-Globulin: 11.1 % (ref 7.1–11.8)
Beta Globulin: 5.3 % (ref 4.7–7.2)
Gamma Globulin: 16.6 % (ref 11.1–18.8)
IgA: 111 mg/dL (ref 69–380)
M-Spike, %: NOT DETECTED g/dL

## 2011-05-13 ENCOUNTER — Encounter: Payer: Self-pay | Admitting: Oncology

## 2011-05-21 ENCOUNTER — Other Ambulatory Visit (HOSPITAL_COMMUNITY): Payer: 59

## 2011-05-21 ENCOUNTER — Ambulatory Visit (HOSPITAL_COMMUNITY): Payer: 59

## 2011-05-22 ENCOUNTER — Encounter (HOSPITAL_COMMUNITY): Payer: 59 | Attending: Oncology | Admitting: Oncology

## 2011-05-22 ENCOUNTER — Ambulatory Visit (HOSPITAL_COMMUNITY)
Admission: RE | Admit: 2011-05-22 | Discharge: 2011-05-22 | Disposition: A | Discharge status: Home / Self Care | Payer: 59 | Source: Ambulatory Visit | Attending: Oncology | Admitting: Oncology

## 2011-05-22 VITALS — BP 126/79 | HR 90 | Temp 97.8°F | Wt 140.2 lb

## 2011-05-22 DIAGNOSIS — C9000 Multiple myeloma not having achieved remission: Secondary | ICD-10-CM | POA: Insufficient documentation

## 2011-05-22 DIAGNOSIS — D702 Other drug-induced agranulocytosis: Secondary | ICD-10-CM

## 2011-05-22 DIAGNOSIS — R05 Cough: Secondary | ICD-10-CM | POA: Insufficient documentation

## 2011-05-22 DIAGNOSIS — J209 Acute bronchitis, unspecified: Secondary | ICD-10-CM | POA: Insufficient documentation

## 2011-05-22 DIAGNOSIS — R059 Cough, unspecified: Secondary | ICD-10-CM | POA: Insufficient documentation

## 2011-05-22 DIAGNOSIS — M8448XA Pathological fracture, other site, initial encounter for fracture: Secondary | ICD-10-CM

## 2011-05-22 LAB — DIFFERENTIAL
Basophils Absolute: 0 10*3/uL (ref 0.0–0.1)
Basophils Relative: 1 % (ref 0–1)
Eosinophils Absolute: 0.1 10*3/uL (ref 0.0–0.7)
Eosinophils Relative: 3 % (ref 0–5)
Monocytes Absolute: 0.2 10*3/uL (ref 0.1–1.0)
Monocytes Relative: 5 % (ref 3–12)

## 2011-05-22 LAB — CBC
HCT: 39.9 % (ref 36.0–46.0)
Hemoglobin: 13.9 g/dL (ref 12.0–15.0)
MCH: 33.9 pg (ref 26.0–34.0)
MCHC: 34.8 g/dL (ref 30.0–36.0)
RDW: 12.9 % (ref 11.5–15.5)

## 2011-05-22 LAB — COMPREHENSIVE METABOLIC PANEL
ALT: 18 U/L (ref 0–35)
AST: 21 U/L (ref 0–37)
CO2: 34 mEq/L — ABNORMAL HIGH (ref 19–32)
Calcium: 10 mg/dL (ref 8.4–10.5)
Chloride: 101 mEq/L (ref 96–112)
Creatinine, Ser: 0.99 mg/dL (ref 0.50–1.10)
GFR calc Af Amer: 74 mL/min — ABNORMAL LOW (ref >90)
GFR calc non Af Amer: 64 mL/min — ABNORMAL LOW (ref >90)
Glucose, Bld: 71 mg/dL (ref 70–99)
Sodium: 139 mEq/L (ref 135–145)
Total Bilirubin: 0.3 mg/dL (ref 0.3–1.2)

## 2011-05-22 MED ORDER — FLUTICASONE-SALMETEROL 100-50 MCG/DOSE IN AEPB: 1.0000 | INHALATION_SPRAY | Freq: Two times a day (BID) | RESPIRATORY_TRACT | Status: AC

## 2011-05-22 NOTE — Progress Notes (Signed)
This office note has been dictated.

## 2011-05-22 NOTE — Progress Notes (Signed)
CC:   Scott A. Gerda Diss, MD Jaclyn Prime Greggory Stallion, M.D. Hewitt Shorts, M.D.  DIAGNOSES: 1. IgA kappa multiple myeloma, stage IIIA by the Durie-Salmon     Classification, stage I by the National Myeloma Working Group     scoring system.  She was diagnosed in May 2010 and treated with     chemotherapy followed by high-dose melphalan and then autologous     stem cell transplant on 03/06/2009, which was day zero. 2. T6 collapsed of vertebral body, presenting with pain, status post     radiation therapy but not a candidate for vertebroplasty according     to Dr. Corliss Skains. 3. Hypokalemia in the past. 4. Grade 1 peripheral neuropathy, much improved. 5. Revlimid-induced neutropenia.  She is basically taking 5 mg of     Revlimid, 7 days on and 7 days off.  She has not had any for 2     weeks because of a gastrointestinal bug with severe diarrhea and     gastritis. 6. Acute bronchitis. 7. Wheezing secondary to acute bronchitis, though she has a childhood     history of asthma.  INTERVAL HISTORY:  Melissa [name] had this acute GI bug for several days treated with Cipro.  Within 24 hours of the Cipro, she was better.  So, it may well have been salmonella or shigella potentially.  She also then developed this past weekend a cough, not productive.  No fever, except for 1 day when it was just over 100.  She did not report it to anyone, but she just finished the Cipro a couple days before.  She feels like it went right from the chest, not involving her sinuses ever, not involving laryngitis, etc.  Today, she is afebrile, looks great, and feels great.  When I listened to her lungs, she had numerous rhonchi and wheezes, more in the left lower lung than the right.  She had it them anteriorly and posteriorly. She had a normal heart exam and she had no obvious adenopathy in her neck.  She had no leg edema.  We got a chest x-ray, which showed no pneumonia.  Her blood counts showed that her neutrophils were  actually quite adequate today at 1,800, total white count 3,300, hemoglobin 13.9 g, and platelets 221,000.  Her CMET showed a normal calcium at 10 and normal total protein at 7.4, which is stable, normal liver enzymes, and normal electrolytes.  I have put her on an Advair inhaler 100/50 and I want her to use that for 14 days b.i.d., but I want her to come back next week so I can listen to her.  Otherwise, she is to see Dr. Greggory Stallion in May.  We will continue our blood work on schedule, though we did move it up from next week to today.  I will see her in July since Dr. Greggory Stallion is going to see her in May.    ______________________________ Ladona Horns. Mariel Sleet, MD ESN/MEDQ  D:  05/22/2011  T:  05/22/2011  Job:  [id number]

## 2011-05-26 LAB — MULTIPLE MYELOMA PANEL, SERUM
Alpha-1-Globulin: 3.7 % (ref 2.9–4.9)
Beta 2: 4.9 % (ref 3.2–6.5)
Gamma Globulin: 16.9 % (ref 11.1–18.8)
M-Spike, %: NOT DETECTED g/dL

## 2011-05-28 ENCOUNTER — Other Ambulatory Visit (HOSPITAL_COMMUNITY): Payer: 59

## 2011-05-28 ENCOUNTER — Encounter (HOSPITAL_BASED_OUTPATIENT_CLINIC_OR_DEPARTMENT_OTHER): Payer: 59

## 2011-05-28 DIAGNOSIS — C9000 Multiple myeloma not having achieved remission: Secondary | ICD-10-CM

## 2011-05-28 MED ORDER — SODIUM CHLORIDE 0.9 % IJ SOLN
INTRAMUSCULAR | Status: AC
  Filled 2011-05-28: qty 10

## 2011-05-28 MED ORDER — SODIUM CHLORIDE 0.9 % IJ SOLN
10.0000 mL | Freq: Once | INTRAMUSCULAR | Status: DC
  Filled 2011-05-28: qty 10

## 2011-05-28 MED ORDER — ZOLEDRONIC ACID 4 MG/5ML IV CONC
4.0000 mg | Freq: Once | INTRAVENOUS | Status: AC
  Administered 2011-05-28: 4 mg via INTRAVENOUS
  Filled 2011-05-28: qty 5

## 2011-05-28 MED ORDER — SODIUM CHLORIDE 0.9 % IV SOLN
Freq: Once | INTRAVENOUS | Status: AC
  Administered 2011-05-28: 15:00:00 via INTRAVENOUS

## 2011-05-28 NOTE — Progress Notes (Signed)
Tolerated well

## 2011-06-10 ENCOUNTER — Encounter: Payer: Self-pay | Admitting: Oncology

## 2011-08-03 ENCOUNTER — Ambulatory Visit (HOSPITAL_COMMUNITY): Payer: 59

## 2011-08-05 ENCOUNTER — Encounter (HOSPITAL_COMMUNITY): Payer: 59 | Attending: Oncology

## 2011-08-05 ENCOUNTER — Encounter (HOSPITAL_BASED_OUTPATIENT_CLINIC_OR_DEPARTMENT_OTHER): Payer: 59

## 2011-08-05 DIAGNOSIS — C9000 Multiple myeloma not having achieved remission: Secondary | ICD-10-CM

## 2011-08-05 LAB — COMPREHENSIVE METABOLIC PANEL
ALT: 16 U/L (ref 0–35)
Albumin: 3.8 g/dL (ref 3.5–5.2)
Alkaline Phosphatase: 54 U/L (ref 39–117)
BUN: 15 mg/dL (ref 6–23)
Chloride: 104 mEq/L (ref 96–112)
GFR calc Af Amer: 74 mL/min — ABNORMAL LOW (ref >90)
Glucose, Bld: 57 mg/dL — ABNORMAL LOW (ref 70–99)
Potassium: 4.1 mEq/L (ref 3.5–5.1)
Sodium: 140 mEq/L (ref 135–145)
Total Bilirubin: 0.3 mg/dL (ref 0.3–1.2)
Total Protein: 7.1 g/dL (ref 6.0–8.3)

## 2011-08-05 LAB — DIFFERENTIAL
Basophils Relative: 2 % — ABNORMAL HIGH (ref 0–1)
Eosinophils Absolute: 0.1 10*3/uL (ref 0.0–0.7)
Eosinophils Relative: 4 % (ref 0–5)
Lymphs Abs: 1.1 10*3/uL (ref 0.7–4.0)
Monocytes Relative: 12 % (ref 3–12)
Neutrophils Relative %: 38 % — ABNORMAL LOW (ref 43–77)

## 2011-08-05 LAB — CBC
HCT: 38.5 % (ref 36.0–46.0)
Hemoglobin: 13.2 g/dL (ref 12.0–15.0)
MCHC: 34.3 g/dL (ref 30.0–36.0)
WBC: 2.5 10*3/uL — ABNORMAL LOW (ref 4.0–10.5)

## 2011-08-05 MED ORDER — SODIUM CHLORIDE 0.9 % IJ SOLN
INTRAMUSCULAR | Status: AC
  Filled 2011-08-05: qty 10

## 2011-08-05 MED ORDER — SODIUM CHLORIDE 0.9 % IV SOLN
Freq: Once | INTRAVENOUS | Status: AC
  Administered 2011-08-05: 15:00:00 via INTRAVENOUS

## 2011-08-05 MED ORDER — ZOLEDRONIC ACID 4 MG/5ML IV CONC
4.0000 mg | Freq: Once | INTRAVENOUS | Status: AC
  Administered 2011-08-05: 4 mg via INTRAVENOUS
  Filled 2011-08-05: qty 5

## 2011-08-05 MED ORDER — SODIUM CHLORIDE 0.9 % IJ SOLN
10.0000 mL | INTRAMUSCULAR | Status: DC | PRN
  Administered 2011-08-05: 10 mL
  Filled 2011-08-05: qty 10

## 2011-08-05 NOTE — Progress Notes (Signed)
Labs drawn today for cbc/diff,cmp,kllc,mm panel and spep

## 2011-08-07 LAB — MULTIPLE MYELOMA PANEL, SERUM
Albumin ELP: 59.9 % (ref 55.8–66.1)
Alpha-1-Globulin: 3.1 % (ref 2.9–4.9)
Beta 2: 3.5 % (ref 3.2–6.5)
Beta Globulin: 5.9 % (ref 4.7–7.2)
Gamma Globulin: 16.7 % (ref 11.1–18.8)
IgM, Serum: 24 mg/dL — ABNORMAL LOW (ref 52–322)

## 2011-09-16 ENCOUNTER — Encounter (HOSPITAL_COMMUNITY): Payer: 59 | Attending: Oncology

## 2011-09-16 ENCOUNTER — Encounter (HOSPITAL_BASED_OUTPATIENT_CLINIC_OR_DEPARTMENT_OTHER): Payer: 59

## 2011-09-16 DIAGNOSIS — C9000 Multiple myeloma not having achieved remission: Secondary | ICD-10-CM

## 2011-09-16 LAB — CBC
HCT: 39.3 % (ref 36.0–46.0)
Hemoglobin: 13.3 g/dL (ref 12.0–15.0)
RBC: 3.99 MIL/uL (ref 3.87–5.11)
WBC: 3 10*3/uL — ABNORMAL LOW (ref 4.0–10.5)

## 2011-09-16 LAB — COMPREHENSIVE METABOLIC PANEL
ALT: 14 U/L (ref 0–35)
Alkaline Phosphatase: 47 U/L (ref 39–117)
BUN: 16 mg/dL (ref 6–23)
CO2: 30 mEq/L (ref 19–32)
Chloride: 103 mEq/L (ref 96–112)
GFR calc Af Amer: 68 mL/min — ABNORMAL LOW (ref >90)
Glucose, Bld: 64 mg/dL — ABNORMAL LOW (ref 70–99)
Potassium: 3.9 mEq/L (ref 3.5–5.1)
Sodium: 139 mEq/L (ref 135–145)
Total Bilirubin: 0.4 mg/dL (ref 0.3–1.2)
Total Protein: 7.2 g/dL (ref 6.0–8.3)

## 2011-09-16 LAB — DIFFERENTIAL
Basophils Relative: 1 % (ref 0–1)
Eosinophils Absolute: 0.1 10*3/uL (ref 0.0–0.7)
Eosinophils Relative: 3 % (ref 0–5)
Lymphs Abs: 1.1 10*3/uL (ref 0.7–4.0)

## 2011-09-16 MED ORDER — SODIUM CHLORIDE 0.9 % IV SOLN
Freq: Once | INTRAVENOUS | Status: AC
  Administered 2011-09-16: 15:00:00 via INTRAVENOUS

## 2011-09-16 MED ORDER — ZOLEDRONIC ACID 4 MG/5ML IV CONC
4.0000 mg | Freq: Once | INTRAVENOUS | Status: AC
  Administered 2011-09-16: 4 mg via INTRAVENOUS
  Filled 2011-09-16: qty 5

## 2011-09-16 MED ORDER — SODIUM CHLORIDE 0.9 % IJ SOLN
INTRAMUSCULAR | Status: AC
  Filled 2011-09-16: qty 10

## 2011-09-16 NOTE — Progress Notes (Signed)
Labs drawn today for cbc/diff,cmp,kllc,mm panel 

## 2011-09-16 NOTE — Progress Notes (Signed)
Tolerated zometa infusion well. 

## 2011-09-17 ENCOUNTER — Encounter: Payer: Self-pay | Admitting: Oncology

## 2011-09-17 LAB — KAPPA/LAMBDA LIGHT CHAINS: Kappa free light chain: 1.56 mg/dL (ref 0.33–1.94)

## 2011-09-18 LAB — MULTIPLE MYELOMA PANEL, SERUM
Albumin ELP: 60.9 % (ref 55.8–66.1)
Alpha-1-Globulin: 2.9 % (ref 2.9–4.9)
Alpha-2-Globulin: 10.6 % (ref 7.1–11.8)
Beta 2: 4.4 % (ref 3.2–6.5)
Beta Globulin: 5.5 % (ref 4.7–7.2)
Gamma Globulin: 15.7 % (ref 11.1–18.8)
IgA: 111 mg/dL (ref 69–380)
IgG (Immunoglobin G), Serum: 1130 mg/dL (ref 690–1700)
IgM, Serum: 25 mg/dL — ABNORMAL LOW (ref 52–322)
M-Spike, %: 0.33 g/dL
Total Protein: 6.8 g/dL (ref 6.0–8.3)

## 2011-09-22 ENCOUNTER — Other Ambulatory Visit (HOSPITAL_COMMUNITY): Payer: Self-pay

## 2011-10-08 ENCOUNTER — Encounter: Payer: Self-pay | Admitting: Oncology

## 2011-10-30 ENCOUNTER — Ambulatory Visit (HOSPITAL_COMMUNITY): Payer: 59

## 2011-11-02 ENCOUNTER — Encounter (HOSPITAL_BASED_OUTPATIENT_CLINIC_OR_DEPARTMENT_OTHER): Payer: 59

## 2011-11-02 ENCOUNTER — Encounter (HOSPITAL_COMMUNITY): Payer: 59 | Attending: Oncology

## 2011-11-02 DIAGNOSIS — C9000 Multiple myeloma not having achieved remission: Secondary | ICD-10-CM

## 2011-11-02 LAB — CBC
Hemoglobin: 13.1 g/dL (ref 12.0–15.0)
RBC: 3.93 MIL/uL (ref 3.87–5.11)

## 2011-11-02 LAB — DIFFERENTIAL
Basophils Absolute: 0 10*3/uL (ref 0.0–0.1)
Eosinophils Absolute: 0.2 10*3/uL (ref 0.0–0.7)
Eosinophils Relative: 4 % (ref 0–5)

## 2011-11-02 LAB — COMPREHENSIVE METABOLIC PANEL
ALT: 13 U/L (ref 0–35)
Alkaline Phosphatase: 51 U/L (ref 39–117)
CO2: 33 mEq/L — ABNORMAL HIGH (ref 19–32)
GFR calc Af Amer: 78 mL/min — ABNORMAL LOW (ref >90)
GFR calc non Af Amer: 68 mL/min — ABNORMAL LOW (ref >90)
Glucose, Bld: 78 mg/dL (ref 70–99)
Potassium: 4.2 mEq/L (ref 3.5–5.1)
Sodium: 138 mEq/L (ref 135–145)
Total Bilirubin: 0.2 mg/dL — ABNORMAL LOW (ref 0.3–1.2)

## 2011-11-02 MED ORDER — SODIUM CHLORIDE 0.9 % IV SOLN
Freq: Once | INTRAVENOUS | Status: AC
  Administered 2011-11-02: 250 mL via INTRAVENOUS

## 2011-11-02 MED ORDER — ZOLEDRONIC ACID 4 MG/5ML IV CONC
4.0000 mg | Freq: Once | INTRAVENOUS | Status: AC
  Administered 2011-11-02: 4 mg via INTRAVENOUS
  Filled 2011-11-02: qty 5

## 2011-11-02 MED ORDER — SODIUM CHLORIDE 0.9 % IJ SOLN
INTRAMUSCULAR | Status: AC
  Filled 2011-11-02: qty 10

## 2011-11-02 NOTE — Progress Notes (Signed)
Melissa [name] tolerated infusion well and without incident; verbalizes understanding for follow-up.  No distress noted at time of discharge and patient was discharged home by herself.  

## 2011-11-02 NOTE — Progress Notes (Signed)
Labs drawn today for cbc/diff,cmp,kllc,mm panel 

## 2011-11-04 LAB — MULTIPLE MYELOMA PANEL, SERUM
Albumin ELP: 58.3 % (ref 55.8–66.1)
Beta 2: 4.9 % (ref 3.2–6.5)
Beta Globulin: 5.3 % (ref 4.7–7.2)
IgM, Serum: 25 mg/dL — ABNORMAL LOW (ref 52–322)

## 2011-11-06 LAB — KAPPA/LAMBDA LIGHT CHAINS: Kappa free light chain: 2.03 mg/dL — ABNORMAL HIGH (ref 0.33–1.94)

## 2011-11-18 ENCOUNTER — Encounter (HOSPITAL_BASED_OUTPATIENT_CLINIC_OR_DEPARTMENT_OTHER): Payer: 59 | Admitting: Oncology

## 2011-11-18 VITALS — BP 122/62 | HR 75 | Temp 97.7°F | Ht 62.0 in | Wt 141.0 lb

## 2011-11-18 DIAGNOSIS — C9000 Multiple myeloma not having achieved remission: Secondary | ICD-10-CM

## 2011-11-18 DIAGNOSIS — G609 Hereditary and idiopathic neuropathy, unspecified: Secondary | ICD-10-CM

## 2011-11-18 NOTE — Patient Instructions (Signed)
[facility]  Discharge Instructions Melissa [name]  [id number] [birth year] Dr. Glenford Peers   RECOMMENDATIONS MADE BY THE CONSULTANT AND ANY TEST RESULTS WILL BE SENT TO YOUR REFERRING DOCTOR.   EXAM FINDINGS BY MD TODAY AND SIGNS AND SYMPTOMS TO REPORT TO CLINIC OR PRIMARY MD: exam good  MEDICATIONS PRESCRIBED: Continue revlimed at same dose INSTRUCTIONS GIVEN AND DISCUSSED: cmet monthly other labs every 8 weeks  SPECIAL INSTRUCTIONS/FOLLOW-UP: 6 months to see [place]   I acknowledge that I have been informed and understand all the instructions given to me and received a copy. I do not have any more questions at this time, but understand that I may call the Specialty Clinic at [facility] at [phone] during business hours should I have any further questions or need assistance in obtaining follow-up care.    __________________________________________  _____________  __________ Signature of Patient or Authorized Representative            Date                   Time    __________________________________________ Nurse's Signature

## 2011-11-18 NOTE — Progress Notes (Signed)
Problem #1 IgA kappa multiple myeloma stage re\re a by the Durie- Salmon classification system but stage I by the national myeloma working group scoring system. 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  Problem #2 T6 vertebral body collapse present with pain treated with radiation therapy but not felt to be a vertebroplasty candidate.  Problem #3 grade 1 peripheral neuropathy which is much improved.  Problem #4 Revlimid-induced neutropenia she is presently on 5 mg a day for 7 days and then off for 7 days and it's repeated. Problem #5 bronchitis February 2013  The patient is doing extremely well working full-time feels great except for some chronic intermittent neck discomfort which is felt to be arthritic in nature. She takes ibuprofen for this. She is seeing Dr. Newell Coral in the past for T6 vertebral body collapse and he told her at the time that she also had arthritis in her neck she states his pain is not new or different.  Review of systems oncologically is negative.  Vital signs are recorded she is in no acute distress she does have some mild neck muscle spasticity more on the left than the right. She has no adenopathy in the cervical supraclavicular infraclavicular axillary or inguinal areas abdomen is soft nontender bowel sounds are normal she has no hepatosplenomegaly. Heart shows a regular rhythm and rate without murmur rub or gallop. Her lungs are clear to auscultation and percussion. Skin exam is unremarkable. Her breast exam is deferred she has a done on a regular basis by her gynecologist. Mammography is up-to-date.  Labs the other day are excellent and we are sending dose to Dr. Greggory Stallion at [facility]. We will see her in January he will see her in November we will start doing blood work every 8 weeks though a cmet will be done prior to Zometa monthly

## 2011-12-03 ENCOUNTER — Encounter (HOSPITAL_BASED_OUTPATIENT_CLINIC_OR_DEPARTMENT_OTHER): Payer: 59

## 2011-12-03 ENCOUNTER — Other Ambulatory Visit (HOSPITAL_COMMUNITY): Payer: Self-pay | Admitting: Oncology

## 2011-12-03 ENCOUNTER — Encounter (HOSPITAL_COMMUNITY): Payer: 59 | Attending: Oncology

## 2011-12-03 DIAGNOSIS — C9000 Multiple myeloma not having achieved remission: Secondary | ICD-10-CM | POA: Insufficient documentation

## 2011-12-03 LAB — COMPREHENSIVE METABOLIC PANEL
ALT: 20 U/L (ref 0–35)
AST: 24 U/L (ref 0–37)
Albumin: 4.1 g/dL (ref 3.5–5.2)
Alkaline Phosphatase: 47 U/L (ref 39–117)
BUN: 11 mg/dL (ref 6–23)
Chloride: 101 mEq/L (ref 96–112)
Potassium: 3.8 mEq/L (ref 3.5–5.1)
Total Bilirubin: 0.4 mg/dL (ref 0.3–1.2)

## 2011-12-03 MED ORDER — SODIUM CHLORIDE 0.9 % IV SOLN
Freq: Once | INTRAVENOUS | Status: AC
  Administered 2011-12-03: 250 mL via INTRAVENOUS

## 2011-12-03 MED ORDER — SODIUM CHLORIDE 0.9 % IJ SOLN
10.0000 mL | INTRAMUSCULAR | Status: DC | PRN
  Filled 2011-12-03: qty 10

## 2011-12-03 MED ORDER — ZOLEDRONIC ACID 4 MG/5ML IV CONC
4.0000 mg | Freq: Once | INTRAVENOUS | Status: AC
  Administered 2011-12-03: 4 mg via INTRAVENOUS
  Filled 2011-12-03: qty 5

## 2011-12-03 NOTE — Progress Notes (Signed)
Labs drawn today for cmp 

## 2011-12-03 NOTE — Progress Notes (Signed)
Melissa [name] tolerated infusion well and without incident; verbalizes understanding for follow-up.  No distress noted at time of discharge and patient was discharged home by herself.

## 2011-12-09 ENCOUNTER — Encounter: Payer: Self-pay | Admitting: Oncology

## 2011-12-31 ENCOUNTER — Other Ambulatory Visit (HOSPITAL_COMMUNITY): Payer: 59

## 2011-12-31 ENCOUNTER — Ambulatory Visit (HOSPITAL_COMMUNITY): Payer: 59

## 2012-01-14 ENCOUNTER — Ambulatory Visit (HOSPITAL_COMMUNITY): Payer: 59

## 2012-01-14 ENCOUNTER — Other Ambulatory Visit (HOSPITAL_COMMUNITY): Payer: 59

## 2012-01-15 ENCOUNTER — Other Ambulatory Visit (HOSPITAL_COMMUNITY): Payer: Self-pay | Admitting: Oncology

## 2012-01-28 ENCOUNTER — Ambulatory Visit (HOSPITAL_COMMUNITY): Payer: 59

## 2012-02-08 ENCOUNTER — Encounter (HOSPITAL_COMMUNITY): Payer: 59

## 2012-02-08 ENCOUNTER — Encounter (HOSPITAL_COMMUNITY): Payer: 59 | Attending: Oncology

## 2012-02-08 VITALS — BP 126/70 | HR 75 | Temp 98.5°F | Resp 16

## 2012-02-08 DIAGNOSIS — C9000 Multiple myeloma not having achieved remission: Secondary | ICD-10-CM | POA: Insufficient documentation

## 2012-02-08 LAB — DIFFERENTIAL
Basophils Absolute: 0 10*3/uL (ref 0.0–0.1)
Basophils Relative: 0 % (ref 0–1)
Eosinophils Absolute: 0.1 10*3/uL (ref 0.0–0.7)
Monocytes Absolute: 0.2 10*3/uL (ref 0.1–1.0)
Monocytes Relative: 7 % (ref 3–12)
Neutro Abs: 2.2 10*3/uL (ref 1.7–7.7)
Neutrophils Relative %: 63 % (ref 43–77)

## 2012-02-08 LAB — COMPREHENSIVE METABOLIC PANEL
Albumin: 3.8 g/dL (ref 3.5–5.2)
Alkaline Phosphatase: 49 U/L (ref 39–117)
BUN: 16 mg/dL (ref 6–23)
CO2: 29 mEq/L (ref 19–32)
Chloride: 101 mEq/L (ref 96–112)
Creatinine, Ser: 0.96 mg/dL (ref 0.50–1.10)
GFR calc non Af Amer: 66 mL/min — ABNORMAL LOW (ref >90)
Potassium: 4.3 mEq/L (ref 3.5–5.1)
Total Bilirubin: 0.3 mg/dL (ref 0.3–1.2)

## 2012-02-08 LAB — CBC
HCT: 40.6 % (ref 36.0–46.0)
Hemoglobin: 14.1 g/dL (ref 12.0–15.0)
MCV: 97.4 fL (ref 78.0–100.0)
RBC: 4.17 MIL/uL (ref 3.87–5.11)
RDW: 12.8 % (ref 11.5–15.5)
WBC: 3.5 10*3/uL — ABNORMAL LOW (ref 4.0–10.5)

## 2012-02-08 MED ORDER — SODIUM CHLORIDE 0.9 % IV SOLN
INTRAVENOUS | Status: DC
  Administered 2012-02-08: 15:00:00 via INTRAVENOUS

## 2012-02-08 MED ORDER — SODIUM CHLORIDE 0.9 % IJ SOLN
INTRAMUSCULAR | Status: AC
  Filled 2012-02-08: qty 10

## 2012-02-08 MED ORDER — ZOLEDRONIC ACID 4 MG/5ML IV CONC
4.0000 mg | Freq: Once | INTRAVENOUS | Status: AC
  Administered 2012-02-08: 4 mg via INTRAVENOUS
  Filled 2012-02-08: qty 5

## 2012-02-08 MED ORDER — SODIUM CHLORIDE 0.9 % IJ SOLN
10.0000 mL | INTRAMUSCULAR | Status: DC | PRN
  Administered 2012-02-08: 10 mL via INTRAVENOUS
  Filled 2012-02-08: qty 10

## 2012-02-08 NOTE — Progress Notes (Signed)
Tolerated infusion well. 

## 2012-02-08 NOTE — Progress Notes (Signed)
Labs drawn today

## 2012-02-09 LAB — KAPPA/LAMBDA LIGHT CHAINS: Lambda free light chains: 1.67 mg/dL (ref 0.57–2.63)

## 2012-02-11 LAB — MULTIPLE MYELOMA PANEL, SERUM
Albumin ELP: 61.8 % (ref 55.8–66.1)
Alpha-1-Globulin: 3.5 % (ref 2.9–4.9)
Alpha-2-Globulin: 11.6 % (ref 7.1–11.8)
Beta 2: 4.5 % (ref 3.2–6.5)
Beta Globulin: 5.6 % (ref 4.7–7.2)
IgA: 113 mg/dL (ref 69–380)
IgM, Serum: 24 mg/dL — ABNORMAL LOW (ref 52–322)
Total Protein: 6.9 g/dL (ref 6.0–8.3)

## 2012-03-21 ENCOUNTER — Ambulatory Visit (HOSPITAL_COMMUNITY): Payer: 59

## 2012-03-22 ENCOUNTER — Other Ambulatory Visit (HOSPITAL_COMMUNITY): Payer: Self-pay | Admitting: Oncology

## 2012-03-22 ENCOUNTER — Encounter (HOSPITAL_COMMUNITY): Payer: 59 | Attending: Oncology

## 2012-03-22 VITALS — BP 126/65 | HR 90 | Temp 98.3°F | Resp 16

## 2012-03-22 DIAGNOSIS — C9000 Multiple myeloma not having achieved remission: Secondary | ICD-10-CM

## 2012-03-22 LAB — COMPREHENSIVE METABOLIC PANEL
ALT: 20 U/L (ref 0–35)
AST: 20 U/L (ref 0–37)
Albumin: 4.1 g/dL (ref 3.5–5.2)
Alkaline Phosphatase: 55 U/L (ref 39–117)
Calcium: 10.3 mg/dL (ref 8.4–10.5)
GFR calc Af Amer: 74 mL/min — ABNORMAL LOW (ref >90)
Glucose, Bld: 75 mg/dL (ref 70–99)
Potassium: 3.6 mEq/L (ref 3.5–5.1)
Sodium: 140 mEq/L (ref 135–145)
Total Protein: 7.3 g/dL (ref 6.0–8.3)

## 2012-03-22 MED ORDER — ZOLEDRONIC ACID 4 MG/5ML IV CONC
4.0000 mg | Freq: Once | INTRAVENOUS | Status: AC
  Administered 2012-03-22: 4 mg via INTRAVENOUS
  Filled 2012-03-22: qty 5

## 2012-03-22 MED ORDER — SODIUM CHLORIDE 0.9 % IV SOLN
INTRAVENOUS | Status: DC
  Administered 2012-03-22: 15:00:00 via INTRAVENOUS

## 2012-03-22 MED ORDER — SODIUM CHLORIDE 0.9 % IJ SOLN
10.0000 mL | INTRAMUSCULAR | Status: DC | PRN
  Administered 2012-03-22: 10 mL via INTRAVENOUS
  Filled 2012-03-22: qty 10

## 2012-03-22 MED ORDER — SODIUM CHLORIDE 0.9 % IJ SOLN
INTRAMUSCULAR | Status: AC
  Filled 2012-03-22: qty 10

## 2012-03-22 NOTE — Progress Notes (Signed)
Tolerated infusion well. 

## 2012-03-23 NOTE — Progress Notes (Signed)
Entered pt's chart to ensure access to CareEverywhere so that we can view Dr. Hezzie Bump notes - access confirmed.

## 2012-03-30 ENCOUNTER — Other Ambulatory Visit (HOSPITAL_COMMUNITY): Payer: Self-pay | Admitting: Oncology

## 2012-03-30 DIAGNOSIS — C9000 Multiple myeloma not having achieved remission: Secondary | ICD-10-CM

## 2012-03-30 MED ORDER — REVLIMID 5 MG PO CAPS
ORAL_CAPSULE | ORAL | Status: DC
Start: 2012-03-30 — End: 2012-04-25

## 2012-04-25 ENCOUNTER — Other Ambulatory Visit (HOSPITAL_COMMUNITY): Payer: Self-pay | Admitting: Oncology

## 2012-04-25 DIAGNOSIS — C9000 Multiple myeloma not having achieved remission: Secondary | ICD-10-CM

## 2012-04-25 MED ORDER — REVLIMID 5 MG PO CAPS: ORAL_CAPSULE | ORAL | Status: DC

## 2012-05-03 ENCOUNTER — Ambulatory Visit (HOSPITAL_COMMUNITY): Payer: 59

## 2012-05-09 ENCOUNTER — Encounter (HOSPITAL_COMMUNITY): Payer: 59 | Attending: Oncology

## 2012-05-09 VITALS — BP 123/75 | HR 74 | Temp 97.9°F | Resp 16

## 2012-05-09 DIAGNOSIS — C9000 Multiple myeloma not having achieved remission: Secondary | ICD-10-CM

## 2012-05-09 LAB — DIFFERENTIAL
Eosinophils Relative: 6 % — ABNORMAL HIGH (ref 0–5)
Lymphocytes Relative: 32 % (ref 12–46)
Lymphs Abs: 1 10*3/uL (ref 0.7–4.0)
Neutro Abs: 1.4 10*3/uL — ABNORMAL LOW (ref 1.7–7.7)

## 2012-05-09 LAB — CBC
Hemoglobin: 14 g/dL (ref 12.0–15.0)
MCH: 34.2 pg — ABNORMAL HIGH (ref 26.0–34.0)
MCHC: 35.5 g/dL (ref 30.0–36.0)
Platelets: 181 10*3/uL (ref 150–400)
RDW: 13.1 % (ref 11.5–15.5)

## 2012-05-09 LAB — COMPREHENSIVE METABOLIC PANEL
ALT: 14 U/L (ref 0–35)
AST: 18 U/L (ref 0–37)
Alkaline Phosphatase: 48 U/L (ref 39–117)
CO2: 34 mEq/L — ABNORMAL HIGH (ref 19–32)
Calcium: 9.9 mg/dL (ref 8.4–10.5)
Chloride: 100 mEq/L (ref 96–112)
GFR calc Af Amer: 77 mL/min — ABNORMAL LOW (ref >90)
GFR calc non Af Amer: 67 mL/min — ABNORMAL LOW (ref >90)
Glucose, Bld: 78 mg/dL (ref 70–99)
Sodium: 139 mEq/L (ref 135–145)
Total Bilirubin: 0.4 mg/dL (ref 0.3–1.2)

## 2012-05-09 MED ORDER — ZOLEDRONIC ACID 4 MG/5ML IV CONC
3.0000 mg | Freq: Once | INTRAVENOUS | Status: AC
  Administered 2012-05-09: 3 mg via INTRAVENOUS
  Filled 2012-05-09: qty 3.75

## 2012-05-09 NOTE — Progress Notes (Signed)
Tolerated infusion well. 

## 2012-05-10 LAB — KAPPA/LAMBDA LIGHT CHAINS: Kappa, lambda light chain ratio: 0.87 (ref 0.26–1.65)

## 2012-05-11 LAB — MULTIPLE MYELOMA PANEL, SERUM
Alpha-1-Globulin: 3 % (ref 2.9–4.9)
Alpha-2-Globulin: 10.9 % (ref 7.1–11.8)
Beta 2: 4.2 % (ref 3.2–6.5)
Beta Globulin: 5.6 % (ref 4.7–7.2)
Gamma Globulin: 13.6 % (ref 11.1–18.8)
IgG (Immunoglobin G), Serum: 1110 mg/dL (ref 690–1700)
M-Spike, %: NOT DETECTED g/dL

## 2012-05-20 ENCOUNTER — Encounter (HOSPITAL_COMMUNITY): Payer: Self-pay | Admitting: Oncology

## 2012-05-20 ENCOUNTER — Encounter (HOSPITAL_BASED_OUTPATIENT_CLINIC_OR_DEPARTMENT_OTHER): Payer: 59 | Admitting: Oncology

## 2012-05-20 VITALS — BP 123/85 | HR 86 | Temp 97.6°F | Resp 18 | Wt 144.2 lb

## 2012-05-20 DIAGNOSIS — C9000 Multiple myeloma not having achieved remission: Secondary | ICD-10-CM

## 2012-05-20 NOTE — Patient Instructions (Addendum)
[facility] Discharge Instructions  RECOMMENDATIONS MADE BY THE CONSULTANT AND ANY TEST RESULTS WILL BE SENT TO YOUR REFERRING PHYSICIAN.  EXAM FINDINGS BY THE PHYSICIAN TODAY AND SIGNS OR SYMPTOMS TO REPORT TO CLINIC OR PRIMARY PHYSICIAN: Exam and discussion by MD.  Bonita Quin are doing well.  Will need to check all labs and urine in March.  MEDICATIONS PRESCRIBED:  none  INSTRUCTIONS GIVEN AND DISCUSSED: Report uncontrolled pain.  SPECIAL INSTRUCTIONS/FOLLOW-UP: Lab work in March and to see MD in July.  Thank you for choosing [facility] to provide your oncology and hematology care.  To afford each patient quality time with our providers, please arrive at least 15 minutes before your scheduled appointment time.  With your help, our goal is to use those 15 minutes to complete the necessary work-up to ensure our physicians have the information they need to help with your evaluation and healthcare recommendations.    Effective January 1st, 2014, we ask that you re-schedule your appointment with our physicians should you arrive 10 or more minutes late for your appointment.  We strive to give you quality time with our providers, and arriving late affects you and other patients whose appointments are after yours.    Again, thank you for choosing [facility].  Our hope is that these requests will decrease the amount of time that you wait before being seen by our physicians.       _____________________________________________________________  Should you have questions after your visit to [facility], please contact our office at [phone] between the hours of 8:30 a.m. and 5:00 p.m.  Voicemails left after 4:30 p.m. will not be returned until the following business day.  For prescription refill requests, have your pharmacy contact our office with your prescription refill request.

## 2012-05-20 NOTE — Progress Notes (Signed)
Problem 1 IgA kappa multiple myeloma. 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 She presented with the T6 vertebral body collapse and pain status post radiation therapy not felt to be a vertebroplasty candidate. She remains on Revlimid 5 mg 7 days on and 7 days off with an axillary remission. Problem #2 grade 1 peripheral neuropathy, much improved problem #3 Revlimid-induced neutropenia which is why she is on the present problem still with mild leukopenia.  Her labs are outstanding. She is still mildly leukopenic and neutropenic. Her absolute neutrophil count is 1400. Her total white count is 3200. Hemoglobin however is 14 g and platelets are 181,000. No M spike is detected and a kappa and lambda light chains remain excellent. Calcium is normal total protein is normal renal function is normal.  Review of systems is negative oncologically. Vital signs are stable. Lymph nodes are negative throughout. Lungs are clear. Heart shows a regular rhythm and rate without murmur rub or gallop. She has no arm or leg edema. Abdomen is soft and nontender without organomegaly. She is alert and oriented. Facial symmetry is intact. She is in no acute distress.  She is doing great. We will do blood work every 8 weeks. She'll see Dr. Greggory Stallion at [facility] in May and I will see her in July. She promises to continue mammography, etc.

## 2012-05-31 ENCOUNTER — Other Ambulatory Visit (HOSPITAL_COMMUNITY): Payer: Self-pay | Admitting: Oncology

## 2012-05-31 DIAGNOSIS — C9000 Multiple myeloma not having achieved remission: Secondary | ICD-10-CM

## 2012-05-31 MED ORDER — REVLIMID 5 MG PO CAPS: ORAL_CAPSULE | ORAL | Status: DC

## 2012-06-20 ENCOUNTER — Ambulatory Visit (HOSPITAL_COMMUNITY): Payer: 59

## 2012-06-24 ENCOUNTER — Encounter (HOSPITAL_COMMUNITY): Payer: 59 | Attending: Oncology

## 2012-06-24 DIAGNOSIS — C9000 Multiple myeloma not having achieved remission: Secondary | ICD-10-CM | POA: Insufficient documentation

## 2012-06-24 LAB — COMPREHENSIVE METABOLIC PANEL
AST: 18 U/L (ref 0–37)
CO2: 30 mEq/L (ref 19–32)
Calcium: 9.8 mg/dL (ref 8.4–10.5)
Creatinine, Ser: 1.06 mg/dL (ref 0.50–1.10)
GFR calc Af Amer: 67 mL/min — ABNORMAL LOW (ref >90)
GFR calc non Af Amer: 58 mL/min — ABNORMAL LOW (ref >90)
Glucose, Bld: 85 mg/dL (ref 70–99)
Total Protein: 7.3 g/dL (ref 6.0–8.3)

## 2012-06-24 LAB — DIFFERENTIAL
Basophils Absolute: 0 10*3/uL (ref 0.0–0.1)
Eosinophils Absolute: 0.2 10*3/uL (ref 0.0–0.7)
Eosinophils Relative: 4 % (ref 0–5)
Lymphocytes Relative: 30 % (ref 12–46)
Monocytes Absolute: 0.6 10*3/uL (ref 0.1–1.0)

## 2012-06-24 LAB — CBC
HCT: 39.9 % (ref 36.0–46.0)
MCH: 33.7 pg (ref 26.0–34.0)
MCV: 96.1 fL (ref 78.0–100.0)
RDW: 12.9 % (ref 11.5–15.5)
WBC: 3.8 10*3/uL — ABNORMAL LOW (ref 4.0–10.5)

## 2012-06-24 NOTE — Progress Notes (Signed)
Labs drawn today for cbc/diff,cmp,kllc,mm panel 

## 2012-06-27 LAB — KAPPA/LAMBDA LIGHT CHAINS
Kappa, lambda light chain ratio: 1 (ref 0.26–1.65)
Lambda free light chains: 1.36 mg/dL (ref 0.57–2.63)

## 2012-06-28 ENCOUNTER — Other Ambulatory Visit (HOSPITAL_COMMUNITY): Payer: Self-pay | Admitting: Oncology

## 2012-06-28 DIAGNOSIS — C9000 Multiple myeloma not having achieved remission: Secondary | ICD-10-CM

## 2012-06-28 LAB — MULTIPLE MYELOMA PANEL, SERUM
Gamma Globulin: 13.9 % (ref 11.1–18.8)
IgG (Immunoglobin G), Serum: 1140 mg/dL (ref 690–1700)
M-Spike, %: NOT DETECTED g/dL

## 2012-06-28 MED ORDER — REVLIMID 5 MG PO CAPS: ORAL_CAPSULE | ORAL | Status: DC

## 2012-06-28 MED ORDER — REVLIMID 5 MG PO CAPS
ORAL_CAPSULE | ORAL | Status: DC
Start: 2012-06-28 — End: 2012-07-26

## 2012-06-29 ENCOUNTER — Encounter (HOSPITAL_COMMUNITY): Payer: 59 | Attending: Oncology

## 2012-06-29 VITALS — BP 121/78 | HR 101 | Temp 97.7°F | Resp 18

## 2012-06-29 DIAGNOSIS — C9000 Multiple myeloma not having achieved remission: Secondary | ICD-10-CM | POA: Insufficient documentation

## 2012-06-29 MED ORDER — SODIUM CHLORIDE 0.9 % IV SOLN
INTRAVENOUS | Status: DC
  Administered 2012-06-29: 15:00:00 via INTRAVENOUS

## 2012-06-29 MED ORDER — SODIUM CHLORIDE 0.9 % IJ SOLN
10.0000 mL | INTRAMUSCULAR | Status: DC | PRN
  Administered 2012-06-29: 10 mL via INTRAVENOUS
  Filled 2012-06-29: qty 10

## 2012-06-29 MED ORDER — ZOLEDRONIC ACID 4 MG/5ML IV CONC
3.0000 mg | Freq: Once | INTRAVENOUS | Status: AC
  Administered 2012-06-29: 3 mg via INTRAVENOUS
  Filled 2012-06-29: qty 3.75

## 2012-07-04 ENCOUNTER — Other Ambulatory Visit (HOSPITAL_COMMUNITY): Payer: 59

## 2012-07-11 ENCOUNTER — Ambulatory Visit (HOSPITAL_COMMUNITY): Payer: 59 | Admitting: Oncology

## 2012-07-13 ENCOUNTER — Encounter: Payer: Self-pay | Admitting: *Deleted

## 2012-07-14 ENCOUNTER — Ambulatory Visit (INDEPENDENT_AMBULATORY_CARE_PROVIDER_SITE_OTHER): Payer: 59 | Admitting: Family Medicine

## 2012-07-14 ENCOUNTER — Encounter: Payer: Self-pay | Admitting: Family Medicine

## 2012-07-14 VITALS — BP 136/100 | HR 80 | Wt 144.4 lb

## 2012-07-14 DIAGNOSIS — E039 Hypothyroidism, unspecified: Secondary | ICD-10-CM

## 2012-07-14 MED ORDER — LEVOTHYROXINE SODIUM 75 MCG PO TABS: 75.0000 ug | ORAL_TABLET | Freq: Every day | ORAL | Status: DC

## 2012-07-14 NOTE — Patient Instructions (Signed)
Use medicine as recommendation  Do labs in 8 to 10 weeks

## 2012-07-14 NOTE — Progress Notes (Signed)
  Subjective:     Melissa [name] is a 57 y.o. female who presents for follow up of hypothyroidism. Current symptoms: none. Patient denies diarrhea, heat / cold intolerance, nervousness, palpitations and weight changes. Symptoms have been well-controlled.  The following portions of the patient's history were reviewed and updated as appropriate: allergies, current medications, past family history, past medical history, past social history, past surgical history and problem list.  Review of Systems A comprehensive review of systems was negative.    Objective:    General appearance: alert, cooperative and appears stated age  Laboratory: Lab Results  Component Value Date   TSH 4.64 07/07/2012   TSH 3.967Test methodology is 3rd generation TSH 08/05/2009      Assessment:    Hypothyroidism.  Replacement.     Plan:    1. L-thyroxine per orders. 2. Recheck thyroid function tests in 2 months. 3. Instructed not to take multivitamins or iron within 4 hours of taking thyroid medications. 4. Follow up in a few year.  The patient will followup in one years time. We will increase her levothyroxine to 75 mcg Unspecified hypothyroidism - Plan: levothyroxine (SYNTHROID, LEVOTHROID) 75 MCG tablet, TSH, T4, free   Are Smart set documented

## 2012-07-18 ENCOUNTER — Encounter (HOSPITAL_COMMUNITY): Payer: Self-pay | Admitting: Oncology

## 2012-07-18 ENCOUNTER — Encounter (HOSPITAL_COMMUNITY): Payer: 59 | Admitting: Oncology

## 2012-07-18 VITALS — BP 129/81 | HR 79 | Temp 97.5°F | Resp 16 | Wt 144.5 lb

## 2012-07-18 NOTE — Progress Notes (Signed)
Pt MD appointment scheduled in error. Pt not to see MD until July. Appointment scheduled accordingly.

## 2012-07-26 ENCOUNTER — Other Ambulatory Visit (HOSPITAL_COMMUNITY): Payer: Self-pay | Admitting: Oncology

## 2012-07-26 DIAGNOSIS — C9000 Multiple myeloma not having achieved remission: Secondary | ICD-10-CM

## 2012-07-26 MED ORDER — REVLIMID 5 MG PO CAPS
ORAL_CAPSULE | ORAL | Status: DC
Start: 2012-07-26 — End: 2012-07-26

## 2012-07-26 MED ORDER — REVLIMID 5 MG PO CAPS
ORAL_CAPSULE | ORAL | Status: DC
Start: 2012-07-26 — End: 2012-08-24

## 2012-08-10 ENCOUNTER — Ambulatory Visit (HOSPITAL_COMMUNITY): Payer: 59

## 2012-08-15 ENCOUNTER — Encounter (HOSPITAL_COMMUNITY): Payer: 59 | Attending: Oncology

## 2012-08-15 VITALS — BP 126/75 | HR 74 | Resp 16

## 2012-08-15 DIAGNOSIS — C9000 Multiple myeloma not having achieved remission: Secondary | ICD-10-CM

## 2012-08-15 LAB — DIFFERENTIAL
Basophils Absolute: 0 10*3/uL (ref 0.0–0.1)
Basophils Relative: 0 % (ref 0–1)
Eosinophils Absolute: 0.1 10*3/uL (ref 0.0–0.7)
Eosinophils Relative: 3 % (ref 0–5)
Lymphocytes Relative: 19 % (ref 12–46)
Lymphs Abs: 1 10*3/uL (ref 0.7–4.0)
Monocytes Absolute: 0.3 10*3/uL (ref 0.1–1.0)
Monocytes Relative: 7 % (ref 3–12)
Neutro Abs: 3.6 10*3/uL (ref 1.7–7.7)
Neutrophils Relative %: 72 % (ref 43–77)

## 2012-08-15 LAB — COMPREHENSIVE METABOLIC PANEL
AST: 23 U/L (ref 0–37)
Albumin: 4.1 g/dL (ref 3.5–5.2)
BUN: 18 mg/dL (ref 6–23)
Creatinine, Ser: 0.99 mg/dL (ref 0.50–1.10)
Total Protein: 7.4 g/dL (ref 6.0–8.3)

## 2012-08-15 LAB — CBC
HCT: 39.6 % (ref 36.0–46.0)
MCHC: 35.9 g/dL (ref 30.0–36.0)
MCV: 95.4 fL (ref 78.0–100.0)
Platelets: 198 10*3/uL (ref 150–400)
RDW: 12.8 % (ref 11.5–15.5)

## 2012-08-15 MED ORDER — SODIUM CHLORIDE 0.9 % IV SOLN
INTRAVENOUS | Status: DC
  Administered 2012-08-15: 16:00:00 via INTRAVENOUS

## 2012-08-15 MED ORDER — ZOLEDRONIC ACID 4 MG/5ML IV CONC
3.0000 mg | Freq: Once | INTRAVENOUS | Status: AC
  Administered 2012-08-15: 3 mg via INTRAVENOUS
  Filled 2012-08-15: qty 3.75

## 2012-08-15 NOTE — Progress Notes (Signed)
Melissa [name] tolerated infusion well and w/o incident.

## 2012-08-16 LAB — KAPPA/LAMBDA LIGHT CHAINS
Kappa, lambda light chain ratio: 0.97 (ref 0.26–1.65)
Lambda free light chains: 1.81 mg/dL (ref 0.57–2.63)

## 2012-08-17 LAB — MULTIPLE MYELOMA PANEL, SERUM
Albumin ELP: 60.9 % (ref 55.8–66.1)
Alpha-1-Globulin: 3 % (ref 2.9–4.9)
Beta 2: 4.4 % (ref 3.2–6.5)
Beta Globulin: 5.9 % (ref 4.7–7.2)
Gamma Globulin: 14.6 % (ref 11.1–18.8)
IgM, Serum: 26 mg/dL — ABNORMAL LOW (ref 52–322)

## 2012-08-24 ENCOUNTER — Other Ambulatory Visit (HOSPITAL_COMMUNITY): Payer: Self-pay | Admitting: Oncology

## 2012-08-24 DIAGNOSIS — C9000 Multiple myeloma not having achieved remission: Secondary | ICD-10-CM

## 2012-08-24 MED ORDER — REVLIMID 5 MG PO CAPS: ORAL_CAPSULE | ORAL | Status: DC

## 2012-08-29 ENCOUNTER — Encounter: Payer: Self-pay | Admitting: Oncology

## 2012-09-10 ENCOUNTER — Encounter: Payer: Self-pay | Admitting: Oncology

## 2012-09-21 ENCOUNTER — Other Ambulatory Visit: Payer: Self-pay | Admitting: Family Medicine

## 2012-09-21 DIAGNOSIS — Z139 Encounter for screening, unspecified: Secondary | ICD-10-CM

## 2012-09-26 ENCOUNTER — Ambulatory Visit (HOSPITAL_COMMUNITY): Payer: 59

## 2012-09-26 ENCOUNTER — Other Ambulatory Visit (HOSPITAL_COMMUNITY): Payer: Self-pay | Admitting: Oncology

## 2012-09-29 ENCOUNTER — Telehealth: Payer: Self-pay | Admitting: Family Medicine

## 2012-09-29 DIAGNOSIS — E039 Hypothyroidism, unspecified: Secondary | ICD-10-CM

## 2012-09-29 DIAGNOSIS — Z0000 Encounter for general adult medical examination without abnormal findings: Secondary | ICD-10-CM

## 2012-09-29 MED ORDER — PREDNISONE 20 MG PO TABS: ORAL_TABLET | ORAL | Status: DC

## 2012-09-29 MED ORDER — TRIAMCINOLONE ACETONIDE 0.1 % EX CREA: TOPICAL_CREAM | Freq: Two times a day (BID) | CUTANEOUS | Status: DC

## 2012-09-29 NOTE — Telephone Encounter (Signed)
Prednisone 20mg , #18, 3qd for 3d then 2qd for 3d then 1qd for 3d, also triamcinolone 0.1% cream apply bid prn 1 refill on cream

## 2012-09-29 NOTE — Telephone Encounter (Signed)
Any rec with the poison ivy

## 2012-09-29 NOTE — Telephone Encounter (Signed)
Pt needs BW papers for TSH, Free T4 for her thyroid

## 2012-09-29 NOTE — Telephone Encounter (Signed)
TSH, free T4 recommended. Please discuss with the patient if she desires to have cholesterol profile and glucose checked. This should be done periodically for her wellness.

## 2012-09-29 NOTE — Telephone Encounter (Signed)
Meds sent electronically to [facility]. Blood work papers up front for patient pick up.  Patient notified.

## 2012-09-29 NOTE — Telephone Encounter (Signed)
Pt also has poison ivy and wants to know if you will call her in some prednisone and triamcinolone cream to [facility]

## 2012-09-30 ENCOUNTER — Other Ambulatory Visit (HOSPITAL_COMMUNITY): Payer: Self-pay | Admitting: Oncology

## 2012-09-30 DIAGNOSIS — C9000 Multiple myeloma not having achieved remission: Secondary | ICD-10-CM

## 2012-09-30 MED ORDER — REVLIMID 5 MG PO CAPS: ORAL_CAPSULE | ORAL | Status: DC

## 2012-10-03 ENCOUNTER — Ambulatory Visit (HOSPITAL_COMMUNITY)
Admission: RE | Admit: 2012-10-03 | Discharge: 2012-10-03 | Disposition: A | Discharge status: Home / Self Care | Payer: 59 | Source: Ambulatory Visit | Attending: Family Medicine | Admitting: Family Medicine

## 2012-10-03 DIAGNOSIS — Z139 Encounter for screening, unspecified: Secondary | ICD-10-CM

## 2012-10-03 DIAGNOSIS — Z1231 Encounter for screening mammogram for malignant neoplasm of breast: Secondary | ICD-10-CM | POA: Insufficient documentation

## 2012-10-05 ENCOUNTER — Encounter (HOSPITAL_COMMUNITY): Payer: 59 | Attending: Oncology

## 2012-10-05 ENCOUNTER — Encounter (HOSPITAL_BASED_OUTPATIENT_CLINIC_OR_DEPARTMENT_OTHER): Payer: 59

## 2012-10-05 VITALS — BP 120/78 | HR 73 | Temp 98.2°F | Resp 16

## 2012-10-05 DIAGNOSIS — C9000 Multiple myeloma not having achieved remission: Secondary | ICD-10-CM | POA: Insufficient documentation

## 2012-10-05 LAB — CBC
HCT: 41.2 % (ref 36.0–46.0)
Hemoglobin: 14.4 g/dL (ref 12.0–15.0)
MCH: 33.7 pg (ref 26.0–34.0)
MCHC: 35 g/dL (ref 30.0–36.0)
MCV: 96.5 fL (ref 78.0–100.0)

## 2012-10-05 LAB — DIFFERENTIAL
Eosinophils Absolute: 0 10*3/uL (ref 0.0–0.7)
Lymphocytes Relative: 25 % (ref 12–46)
Lymphs Abs: 1.8 10*3/uL (ref 0.7–4.0)
Neutrophils Relative %: 61 % (ref 43–77)

## 2012-10-05 LAB — COMPREHENSIVE METABOLIC PANEL
Alkaline Phosphatase: 52 U/L (ref 39–117)
BUN: 18 mg/dL (ref 6–23)
Calcium: 9.8 mg/dL (ref 8.4–10.5)
GFR calc Af Amer: 76 mL/min — ABNORMAL LOW (ref >90)
Glucose, Bld: 87 mg/dL (ref 70–99)
Total Protein: 7.2 g/dL (ref 6.0–8.3)

## 2012-10-05 LAB — T4, FREE: Free T4: 1.56 ng/dL (ref 0.80–1.80)

## 2012-10-05 MED ORDER — ZOLEDRONIC ACID 4 MG/5ML IV CONC
3.0000 mg | Freq: Once | INTRAVENOUS | Status: AC
  Administered 2012-10-05: 3 mg via INTRAVENOUS
  Filled 2012-10-05: qty 3.75

## 2012-10-05 NOTE — Progress Notes (Signed)
Tolerated Zometa 3 mg IV well. IV access D/C and covered with bandaid.

## 2012-10-05 NOTE — Progress Notes (Signed)
Labs drawn today for cbc/diff,cmp,kllc,mm panel 

## 2012-10-05 NOTE — Progress Notes (Signed)
Labs drawn today for cbc/diff,cmp,kllc,mm panel

## 2012-10-07 ENCOUNTER — Encounter: Payer: Self-pay | Admitting: Family Medicine

## 2012-10-07 LAB — MULTIPLE MYELOMA PANEL, SERUM
Albumin ELP: 60.4 % (ref 55.8–66.1)
Alpha-1-Globulin: 3.5 % (ref 2.9–4.9)
Alpha-2-Globulin: 12.2 % — ABNORMAL HIGH (ref 7.1–11.8)
Beta 2: 4.5 % (ref 3.2–6.5)
Gamma Globulin: 14 % (ref 11.1–18.8)
IgM, Serum: 21 mg/dL — ABNORMAL LOW (ref 52–322)

## 2012-11-03 ENCOUNTER — Other Ambulatory Visit (HOSPITAL_COMMUNITY): Payer: Self-pay | Admitting: Oncology

## 2012-11-03 DIAGNOSIS — C9000 Multiple myeloma not having achieved remission: Secondary | ICD-10-CM

## 2012-11-03 MED ORDER — REVLIMID 5 MG PO CAPS: ORAL_CAPSULE | ORAL | Status: DC

## 2012-11-18 ENCOUNTER — Encounter (HOSPITAL_COMMUNITY): Payer: 59 | Attending: Hematology and Oncology

## 2012-11-18 ENCOUNTER — Encounter (HOSPITAL_COMMUNITY): Payer: Self-pay

## 2012-11-18 VITALS — BP 136/78 | HR 80 | Temp 97.9°F | Resp 16 | Wt 145.2 lb

## 2012-11-18 DIAGNOSIS — C9000 Multiple myeloma not having achieved remission: Secondary | ICD-10-CM | POA: Insufficient documentation

## 2012-11-18 NOTE — Patient Instructions (Addendum)
[facility] Discharge Instructions  RECOMMENDATIONS MADE BY THE CONSULTANT AND ANY TEST RESULTS WILL BE SENT TO YOUR REFERRING PHYSICIAN.  EXAM FINDINGS BY THE PHYSICIAN TODAY AND SIGNS OR SYMPTOMS TO REPORT TO CLINIC OR PRIMARY PHYSICIAN: Exam and discussion by Dr. Sharia Reeve.  Recommends that you stop the zometa.    MEDICATIONS PRESCRIBED:  none  INSTRUCTIONS GIVEN AND DISCUSSED: Report unexplained bone pain, numbness, unexplained weight loss, increased fatigue or shortness of breath.  SPECIAL INSTRUCTIONS/FOLLOW-UP: Follow-up in 3 months.  Thank you for choosing [facility] to provide your oncology and hematology care.  To afford each patient quality time with our providers, please arrive at least 15 minutes before your scheduled appointment time.  With your help, our goal is to use those 15 minutes to complete the necessary work-up to ensure our physicians have the information they need to help with your evaluation and healthcare recommendations.    Effective January 1st, 2014, we ask that you re-schedule your appointment with our physicians should you arrive 10 or more minutes late for your appointment.  We strive to give you quality time with our providers, and arriving late affects you and other patients whose appointments are after yours.    Again, thank you for choosing [facility].  Our hope is that these requests will decrease the amount of time that you wait before being seen by our physicians.       _____________________________________________________________  Should you have questions after your visit to [facility], please contact our office at [phone] between the hours of 8:30 a.m. and 5:00 p.m.  Voicemails left after 4:30 p.m. will not be returned until the following business day.  For prescription refill requests, have your pharmacy contact our office with your prescription refill request.

## 2012-11-18 NOTE — Progress Notes (Addendum)
[facility] [phone]   [phone]  OFFICE PROGRESS NOTE  LUKING,SCOTT, MD [address] [place] [address]  DIAGNOSIS: IgA kappa multiple myeloma stage IIIA [place]  ONCOLOGIC HISTORY: According to her records from [facility]; she presented in May of 2010 with right-sided back pain that was worse at night. This progressed to the point where she sought medical attention in her local Emergency Department with x-rays at that time suggesting that she had gallbladder sludge. The pain progressively worsened and was associated with pleuritic pain.  [name] MRI was eventually ordered on 10/05/2008 which showed T6 fracture and she went on to receive radiation therapy from T1-T7 after diagnosis of multiple myeloma was made. She was treated with Velcade on days 1, 4, 8 and 11 and went on to receive 4 cycles of therapy. She was believed to have had excellent response to Velcade therapy and was evaluated for stem cell transplant .   She was given melphalan 200mg /m2 and 03/04/09 and had stem service Infusaid 03/06/2009.  Six month post transplant evaluation (09/03/2010) showed no evidence of recurrent diseaseTwo faint oligoclonal bands: 0.15 gm/dl and 8.11 gm/dl; IFIX IgG Kappa x 2; IgG 899 IgA 78 IgM < 25 Kappa 16 Lambda 10.9. 1 year post transplant evaluation (03/04/2011) showed no evidence of recurrent disease.SPEP - 0.35 gm/dl IFIX - IgG Kappa x 2; IgG 1186 IgA 104 IgM 27 Kappa 25.4 Lambda 15.1 She has been followed on a regular basis with out evidence of disease progression.   She states that presently she takes 5 mg of Revlimid 7 days on 7 days off. Also she is on Zometa 3 milligram every 8 weeks. Zometa date back to at least June of 2010.  INTERVAL HISTORY:   Melissa [name] 57 y.o. female returns to the clinic today for schedule follow up. Her last serum protein electrophoresis was on 10/05/2012 and the test did not show any M spike.  IgA was  normal at 122 and Kapa was 0.88 with normal k/l ratio of 0.62. Chemistry showed normal cancer of 9.6, creatinine of 0.96 and normal hemoglobin of 14.4. To her knowledge she has not had any recent skeletal survey.  She denies any new problems today except for contracture in her hands on  both sides  which she thinks may be Dupuytren's contracture. She denies any bone pain, she is eating well, denies shortness or breath, she denies fever or chills and weight is stable.  She saw Melissa [name] at [facility] in May 2014 and is not has been reviewed and referenced in the oncology history.   MEDICAL HISTORY: Past Medical History  Diagnosis Date  . Multiple myeloma   . Thyroid disease   . Peripheral neuropathy     ALLERGIES:  is allergic to hydromorphone hcl and tape.  MEDICATIONS:  Current Outpatient Prescriptions  Medication Sig Dispense Refill  . Calcium Carbonate-Vitamin D (CALCIUM + D PO) Take 1 tablet by mouth. Calcium 1200mg  and Vit D 2000units daily       . Cholecalciferol (PA VITAMIN D-3) 2000 UNITS CAPS Take by mouth.      . levothyroxine (SYNTHROID, LEVOTHROID) 75 MCG tablet Take 1 tablet (75 mcg total) by mouth daily.  90 tablet  3  . REVLIMID 5 MG capsule Take 1 capsule daily x 7 days with 7 day respite  14 capsule  0  . triamcinolone cream (KENALOG) 0.1 % Apply topically 2 (two) times daily.  30 g  0  . Zoledronic Acid (ZOMETA IV) Inject into the vein every 8 weeks.       No current facility-administered medications for this visit.   Facility-Administered Medications Ordered in Other Visits  Medication Dose Route Frequency Provider Last Rate Last Dose  . 0.9 %  sodium chloride infusion   Intravenous Continuous Melissa Newer, PA-C 20 mL/hr at 12/05/10 0947    . 0.9 %  sodium chloride infusion   Intravenous Continuous Melissa An, MD 20 mL/hr at 02/26/11 1528    . sodium chloride 0.9 % injection 10 mL  10 mL Intravenous PRN Melissa An, MD   10 mL at  02/26/11 1518    SURGICAL HISTORY:  Past Surgical History  Procedure Laterality Date  . Tubal ligation    . Tonsilectomy, adenoidectomy, bilateral myringotomy and tubes    . Cesarean section  06/1993     REVIEW OF SYSTEMS:14 point review of system is as in the history above otherwise negative.  PHYSICAL EXAMINATION:  Blood pressure 136/78, pulse 80, temperature 97.9 F (36.6 C), temperature source Oral, resp. rate 16, weight 145 lb 3.2 oz (65.862 kg). GENERAL: No distress. SKIN:  No rashes or significant lesions and no ecchymosis.  Contracture of both hands not and essentially causing flexion deformity of the fifth finger on both sides. HEAD: Normocephalic, No masses, lesions, tenderness or abnormalities  ENT: External ears normal ,lips, buccal mucosa, and tongue normal and mucous membranes are moist  LYMPH: No palpable lymphadenopathy, in the neck or supraclavicular areas. LUNGS: clear to auscultation , no crackles or wheezes HEART: regular rate & rhythm, no murmurs, no gallops, S1 normal and S2 normal  ABDOMEN: Abdomen soft, non-tender, normal bowel sounds, no masses or organomegaly and no hepatosplenomegaly palpable MSK: No CVA tenderness and no tenderness on percussion of the back or rib cage. EXTREMITIES: No edema, no skin discoloration or tenderness NEURO: alert & oriented    LABORATORY DATA: Lab Results  Component Value Date   WBC 7.0 10/05/2012   HGB 14.4 10/05/2012   HCT 41.2 10/05/2012   MCV 96.5 10/05/2012   PLT 229 10/05/2012      Chemistry      Component Value Date/Time   NA 139 10/05/2012 0825   K 3.7 10/05/2012 0825   CL 99 10/05/2012 0825   CO2 31 10/05/2012 0825   BUN 18 10/05/2012 0825   CREATININE 0.96 10/05/2012 0825      Component Value Date/Time   CALCIUM 9.8 10/05/2012 0825   ALKPHOS 52 10/05/2012 0825   AST 14 10/05/2012 0825   ALT 15 10/05/2012 0825   BILITOT 0.3 10/05/2012 0825       RADIOGRAPHIC STUDIES: No results found.   ASSESSMENT:  Ms.  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  She is on maintenance Revlimid which she is tolerating well a this time.  This is being managed by Melissa [name].  She's been on Zometa since at least June 2010 which is about 4 years.  Most studies  For Zometa is for about 2 years duration and that is only no evidence of support benefit in patients without active myeloma.  Beyond 2 years, it is believed that the risk of osteonecrosis of the jaw goes up, besides  Remain in the  bones for a long time after discontinuation of administration.    PLAN:  1.I discontinued Zometa at this time, given that he has been on this  for more than 4 years and does not have active myeloma at this time. 2. She continues to sustain her admission I will continue her routine blood work as previously outlined. 3. She will return to clinic in 3 months. 4. Follow Melissa [name] scheduled.  5. Continue Revlimid as recommended, patient is aware of slightly increased risk of secondary malignancies and this is acceptable to her. 6. Skeletal survey ordered recommended to be done yearly.   All questions were satisfactorily answered. Patient knows to call if  any concern arises.  I spent more than 50 % counseling the patient face to face. The total time spent in the appointment was 30 minutes.   Sherral Hammers, MD FACP. Hematology/Oncology.    12/22/12: Labs reviewed.Patient is sustaining her response.

## 2012-11-30 ENCOUNTER — Ambulatory Visit (HOSPITAL_COMMUNITY): Payer: 59

## 2012-12-07 ENCOUNTER — Other Ambulatory Visit (HOSPITAL_COMMUNITY): Payer: Self-pay | Admitting: Oncology

## 2012-12-07 DIAGNOSIS — C9000 Multiple myeloma not having achieved remission: Secondary | ICD-10-CM

## 2012-12-07 MED ORDER — REVLIMID 5 MG PO CAPS: ORAL_CAPSULE | ORAL | Status: DC

## 2012-12-22 ENCOUNTER — Encounter (HOSPITAL_COMMUNITY): Payer: 59 | Attending: Hematology and Oncology

## 2012-12-22 DIAGNOSIS — C9000 Multiple myeloma not having achieved remission: Secondary | ICD-10-CM | POA: Insufficient documentation

## 2012-12-22 LAB — COMPREHENSIVE METABOLIC PANEL
ALT: 19 U/L (ref 0–35)
Albumin: 3.8 g/dL (ref 3.5–5.2)
Alkaline Phosphatase: 46 U/L (ref 39–117)
BUN: 15 mg/dL (ref 6–23)
Chloride: 103 mEq/L (ref 96–112)
Glucose, Bld: 74 mg/dL (ref 70–99)
Potassium: 4.3 mEq/L (ref 3.5–5.1)
Sodium: 143 mEq/L (ref 135–145)
Total Bilirubin: 0.5 mg/dL (ref 0.3–1.2)
Total Protein: 7 g/dL (ref 6.0–8.3)

## 2012-12-22 LAB — CBC
HCT: 41.1 % (ref 36.0–46.0)
Hemoglobin: 14.3 g/dL (ref 12.0–15.0)
MCHC: 34.8 g/dL (ref 30.0–36.0)
RDW: 13.1 % (ref 11.5–15.5)
WBC: 2.9 10*3/uL — ABNORMAL LOW (ref 4.0–10.5)

## 2012-12-22 LAB — DIFFERENTIAL
Basophils Relative: 1 % (ref 0–1)
Eosinophils Absolute: 0.2 10*3/uL (ref 0.0–0.7)
Eosinophils Relative: 8 % — ABNORMAL HIGH (ref 0–5)
Lymphs Abs: 0.9 10*3/uL (ref 0.7–4.0)
Monocytes Relative: 15 % — ABNORMAL HIGH (ref 3–12)
Neutrophils Relative %: 46 % (ref 43–77)

## 2012-12-22 NOTE — Progress Notes (Signed)
Labs drawn today for cbc/diff,cmp,kllc,mm 

## 2012-12-23 LAB — KAPPA/LAMBDA LIGHT CHAINS
Kappa free light chain: 0.91 mg/dL (ref 0.33–1.94)
Kappa, lambda light chain ratio: 0.65 (ref 0.26–1.65)
Lambda free light chains: 1.39 mg/dL (ref 0.57–2.63)

## 2012-12-27 ENCOUNTER — Other Ambulatory Visit (HOSPITAL_COMMUNITY): Payer: Self-pay | Admitting: Hematology and Oncology

## 2012-12-27 LAB — MULTIPLE MYELOMA PANEL, SERUM
Albumin ELP: 62.5 % (ref 55.8–66.1)
Alpha-1-Globulin: 3.1 % (ref 2.9–4.9)
Beta 2: 4.3 % (ref 3.2–6.5)
Gamma Globulin: 13.2 % (ref 11.1–18.8)
IgG (Immunoglobin G), Serum: 860 mg/dL (ref 690–1700)
IgM, Serum: 20 mg/dL — ABNORMAL LOW (ref 52–322)

## 2013-01-06 ENCOUNTER — Telehealth: Payer: Self-pay | Admitting: Family Medicine

## 2013-01-06 ENCOUNTER — Other Ambulatory Visit: Payer: Self-pay | Admitting: *Deleted

## 2013-01-06 MED ORDER — PREDNISONE 20 MG PO TABS: ORAL_TABLET | ORAL | Status: DC

## 2013-01-06 NOTE — Telephone Encounter (Signed)
prenisone 20, #18, 3qd for 3d then 2qd for 3d then 1qd for 3d, f/u if ongoing

## 2013-01-06 NOTE — Telephone Encounter (Signed)
Med sent to pharm. Pt notified.  

## 2013-01-06 NOTE — Telephone Encounter (Signed)
Patient says that she has poison oak and Dr Lorin Picket usually calls her in for prednisone for this.    [facility]

## 2013-01-11 ENCOUNTER — Other Ambulatory Visit (HOSPITAL_COMMUNITY): Payer: Self-pay | Admitting: Oncology

## 2013-01-11 DIAGNOSIS — C9000 Multiple myeloma not having achieved remission: Secondary | ICD-10-CM

## 2013-01-11 MED ORDER — REVLIMID 5 MG PO CAPS: ORAL_CAPSULE | ORAL | Status: DC

## 2013-01-17 ENCOUNTER — Other Ambulatory Visit (HOSPITAL_COMMUNITY): Payer: Self-pay | Admitting: Oncology

## 2013-01-17 DIAGNOSIS — C9000 Multiple myeloma not having achieved remission: Secondary | ICD-10-CM

## 2013-01-17 MED ORDER — REVLIMID 5 MG PO CAPS: ORAL_CAPSULE | ORAL | Status: DC

## 2013-01-20 ENCOUNTER — Encounter: Payer: Self-pay | Admitting: Hematology and Oncology

## 2013-02-16 ENCOUNTER — Other Ambulatory Visit (HOSPITAL_COMMUNITY): Payer: Self-pay

## 2013-02-16 DIAGNOSIS — C9000 Multiple myeloma not having achieved remission: Secondary | ICD-10-CM

## 2013-02-17 ENCOUNTER — Encounter (HOSPITAL_COMMUNITY): Payer: 59 | Attending: Hematology and Oncology

## 2013-02-17 ENCOUNTER — Ambulatory Visit (HOSPITAL_COMMUNITY): Payer: 59

## 2013-02-17 ENCOUNTER — Other Ambulatory Visit (HOSPITAL_COMMUNITY): Payer: Self-pay | Admitting: Hematology and Oncology

## 2013-02-17 ENCOUNTER — Telehealth (HOSPITAL_COMMUNITY): Payer: Self-pay | Admitting: Hematology and Oncology

## 2013-02-17 DIAGNOSIS — C9000 Multiple myeloma not having achieved remission: Secondary | ICD-10-CM | POA: Insufficient documentation

## 2013-02-17 LAB — COMPREHENSIVE METABOLIC PANEL
AST: 19 U/L (ref 0–37)
Alkaline Phosphatase: 50 U/L (ref 39–117)
BUN: 14 mg/dL (ref 6–23)
CO2: 32 mEq/L (ref 19–32)
Calcium: 10 mg/dL (ref 8.4–10.5)
Chloride: 103 mEq/L (ref 96–112)
Creatinine, Ser: 0.98 mg/dL (ref 0.50–1.10)
GFR calc Af Amer: 74 mL/min — ABNORMAL LOW (ref >90)
GFR calc non Af Amer: 64 mL/min — ABNORMAL LOW (ref >90)
Glucose, Bld: 57 mg/dL — ABNORMAL LOW (ref 70–99)
Potassium: 4 mEq/L (ref 3.5–5.1)

## 2013-02-17 LAB — CBC WITH DIFFERENTIAL/PLATELET
Basophils Absolute: 0 10*3/uL (ref 0.0–0.1)
Eosinophils Relative: 3 % (ref 0–5)
HCT: 40 % (ref 36.0–46.0)
Lymphocytes Relative: 29 % (ref 12–46)
MCV: 96.9 fL (ref 78.0–100.0)
Monocytes Absolute: 0.3 10*3/uL (ref 0.1–1.0)
Neutro Abs: 2.1 10*3/uL (ref 1.7–7.7)
RDW: 12.8 % (ref 11.5–15.5)
WBC: 3.5 10*3/uL — ABNORMAL LOW (ref 4.0–10.5)

## 2013-02-17 MED ORDER — REVLIMID 5 MG PO CAPS: ORAL_CAPSULE | ORAL | Status: DC

## 2013-02-17 NOTE — Progress Notes (Signed)
Labs drawn today for cbc/diff,cmp,kllc,mm panel

## 2013-02-20 LAB — KAPPA/LAMBDA LIGHT CHAINS
Kappa free light chain: 1.31 mg/dL (ref 0.33–1.94)
Kappa, lambda light chain ratio: 0.74 (ref 0.26–1.65)
Lambda free light chains: 1.78 mg/dL (ref 0.57–2.63)

## 2013-02-21 ENCOUNTER — Other Ambulatory Visit (HOSPITAL_COMMUNITY): Payer: Self-pay | Admitting: Oncology

## 2013-02-21 DIAGNOSIS — C9000 Multiple myeloma not having achieved remission: Secondary | ICD-10-CM

## 2013-02-21 LAB — MULTIPLE MYELOMA PANEL, SERUM
Albumin ELP: 59 % (ref 55.8–66.1)
Gamma Globulin: 13.8 % (ref 11.1–18.8)
IgG (Immunoglobin G), Serum: 1010 mg/dL (ref 690–1700)
M-Spike, %: NOT DETECTED g/dL

## 2013-02-21 MED ORDER — REVLIMID 5 MG PO CAPS: ORAL_CAPSULE | ORAL | Status: DC

## 2013-03-02 ENCOUNTER — Other Ambulatory Visit: Payer: Self-pay

## 2013-03-10 ENCOUNTER — Ambulatory Visit (INDEPENDENT_AMBULATORY_CARE_PROVIDER_SITE_OTHER): Payer: 59 | Admitting: Family Medicine

## 2013-03-10 ENCOUNTER — Encounter: Payer: Self-pay | Admitting: Family Medicine

## 2013-03-10 VITALS — BP 130/70 | Ht 61.0 in | Wt 143.1 lb

## 2013-03-10 DIAGNOSIS — M720 Palmar fascial fibromatosis [Dupuytren]: Secondary | ICD-10-CM

## 2013-03-10 DIAGNOSIS — IMO0002 Reserved for concepts with insufficient information to code with codable children: Secondary | ICD-10-CM

## 2013-03-10 DIAGNOSIS — L988 Other specified disorders of the skin and subcutaneous tissue: Secondary | ICD-10-CM

## 2013-03-10 NOTE — Progress Notes (Signed)
  Subjective:    Patient ID: Melissa [name], female    DOB: 1955/10/22, 57 y.o.   MRN: [id number]  Hand Pain  The incident occurred more than 1 week ago. There was no injury mechanism. The pain is present in the right fingers. The quality of the pain is described as aching (cyst). The pain does not radiate. The pain is moderate. The pain has been intermittent since the incident. Nothing aggravates the symptoms. She has tried nothing for the symptoms. The treatment provided no relief.   Patient has multiple small knot in the left hand on the tendons causing some contracture in the small finger Patient also has what appears to be a cyst developing on the index finger swollen slightly tender becoming red.  Past medical history multiple myeloma Review of Systems No fever. No vomiting    Objective:   Physical Exam The area on the right index finger has a reddened shiny appearance that swollen patient relates that it is slightly tender no other particular troubles the left hand has multiple small knots on the tendon along with contractures of the small finger       Assessment & Plan:  #1 more than likely this appears to be a Dupuytren's contracture referral to orthopedics #2 mucoid cyst of the index finger-under sterile condition this was numbed with small amount of lidocaine. Under sterile conditions a #11 blade was used to make a small cut. The mucoid jellylike substance was obtained. There is no sign of any abscess. It was necessary to do this in order to rule out the possibility of a abscess. Culture was sent but I believe it will come back normal. One stitch was placed because of bleeding bleeding was controlled after this stitch. 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

## 2013-03-14 LAB — WOUND CULTURE
Gram Stain: NONE SEEN
Gram Stain: NONE SEEN

## 2013-03-15 ENCOUNTER — Telehealth: Payer: Self-pay | Admitting: Orthopedic Surgery

## 2013-03-15 NOTE — Telephone Encounter (Signed)
Please review referral per Dr. Lilyan Punt regarding: "Duypuytren's contracture."  Patient states she has 2 problems going on.  Please advise regarding scheduling appointment or other recommendation.  Her work ph# (at [facility], lab) is [phone], cell ph# [phone].

## 2013-03-16 NOTE — Telephone Encounter (Signed)
Contacted patient and also referring primary care, Dr. Lilyan Punt, relayed.  Primary care to refer to hand specialist per Dr. Mort Sawyers recommendation; patient states she "is fine with that". Aware to contact Dr Gerda Diss to be further advised.

## 2013-03-16 NOTE — Telephone Encounter (Signed)
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

## 2013-03-17 NOTE — Progress Notes (Signed)
[facility] 11/21 

## 2013-03-21 ENCOUNTER — Emergency Department (HOSPITAL_COMMUNITY)
Admission: EM | Admit: 2013-03-21 | Discharge: 2013-03-21 | Disposition: A | Discharge status: Home / Self Care | Payer: 59 | Attending: Emergency Medicine | Admitting: Emergency Medicine

## 2013-03-21 ENCOUNTER — Encounter (HOSPITAL_COMMUNITY): Payer: Self-pay | Admitting: Emergency Medicine

## 2013-03-21 DIAGNOSIS — Z8572 Personal history of non-Hodgkin lymphomas: Secondary | ICD-10-CM | POA: Insufficient documentation

## 2013-03-21 DIAGNOSIS — S61209A Unspecified open wound of unspecified finger without damage to nail, initial encounter: Secondary | ICD-10-CM | POA: Diagnosis present

## 2013-03-21 DIAGNOSIS — Z79899 Other long term (current) drug therapy: Secondary | ICD-10-CM | POA: Insufficient documentation

## 2013-03-21 DIAGNOSIS — Y9389 Activity, other specified: Secondary | ICD-10-CM | POA: Insufficient documentation

## 2013-03-21 DIAGNOSIS — W260XXA Contact with knife, initial encounter: Secondary | ICD-10-CM | POA: Diagnosis not present

## 2013-03-21 DIAGNOSIS — S61219A Laceration without foreign body of unspecified finger without damage to nail, initial encounter: Secondary | ICD-10-CM

## 2013-03-21 DIAGNOSIS — Y929 Unspecified place or not applicable: Secondary | ICD-10-CM | POA: Insufficient documentation

## 2013-03-21 DIAGNOSIS — Z8669 Personal history of other diseases of the nervous system and sense organs: Secondary | ICD-10-CM | POA: Diagnosis not present

## 2013-03-21 DIAGNOSIS — E079 Disorder of thyroid, unspecified: Secondary | ICD-10-CM | POA: Insufficient documentation

## 2013-03-21 MED ORDER — LIDOCAINE HCL (PF) 2 % IJ SOLN
2.0000 mL | Freq: Once | INTRAMUSCULAR | Status: DC
  Filled 2013-03-21: qty 10

## 2013-03-21 NOTE — ED Notes (Signed)
Pt cutting bread and cut left thumb to medial area. Bleeding controlled. Slightly deep. Nad.

## 2013-03-23 NOTE — ED Provider Notes (Signed)
CSN: [id number]     Arrival date & time 03/21/13  1124 History   First MD Initiated Contact with Patient 03/21/13 1149     Chief Complaint  Patient presents with  . Laceration   (Consider location/radiation/quality/duration/timing/severity/associated sxs/prior Treatment) Patient is a 56 y.o. female presenting with skin laceration. The history is provided by the patient.  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 Multiple myeloma   . Thyroid disease   . Peripheral neuropathy    Past Surgical History  Procedure Laterality Date  . Tubal ligation    . Tonsilectomy, adenoidectomy, bilateral myringotomy and tubes    . Cesarean section  06/1993   Family History  Problem Relation Age of Onset  . Hypertension Mother   . Heart attack Mother   . Hypertension Father   . Heart attack Father   . Heart disease Maternal Grandmother   . Stroke Maternal Grandmother   . Stroke Maternal Grandfather    History  Substance Use Topics  . Smoking status: Never Smoker   . Smokeless tobacco: Never Used  . Alcohol Use: 0.0 oz/week     Comment: occ   OB History   Grav Para Term Preterm Abortions TAB SAB Ect Mult Living                 Review of Systems  Constitutional: Negative for fever and chills.  HENT: Negative for facial swelling.   Respiratory: Negative for shortness of breath and wheezing.   Skin: Positive for wound.  Neurological: Negative  for numbness.    Allergies  Hydromorphone hcl and Tape  Home Medications   Current Outpatient Rx  Name  Route  Sig  Dispense  Refill  . Calcium Carbonate-Vitamin D (CALCIUM + D PO)   Oral   Take 1 tablet by mouth. Calcium 1200mg  and Vit D 2000units daily          . Cholecalciferol (PA VITAMIN D-3) 2000 UNITS CAPS      Take by mouth.         . levothyroxine (SYNTHROID, LEVOTHROID) 75 MCG tablet   Oral   Take 1 tablet (75 mcg total) by mouth daily.   90 tablet   3    BP 152/85  Pulse 93  Temp(Src) 97.9 F (36.6 C) (Oral)  Resp 18  SpO2 100% Physical Exam  Constitutional: She is oriented to person, place, and time. She appears well-developed and well-nourished.  HENT:  Head: Normocephalic.  Cardiovascular: Normal rate.   Pulmonary/Chest: Effort normal.  Neurological: She is alert and oriented to person, place, and time. No sensory deficit.  Skin: Laceration noted.  1 cm well approximated,  Hemostatic laceration left lateral cuticle edge.         ED Course  Procedures (including critical care time)   LACERATION REPAIR Performed by: Burgess Amor Authorized by: Burgess Amor Consent: Verbal consent obtained. Risks and benefits: risks, benefits and alternatives  were discussed Consent given by: patient Patient identity confirmed: provided demographic data Prepped and Draped in normal sterile fashion Wound explored  Laceration Location: left thumb  Laceration Length: 1 cm  No Foreign Bodies seen or palpated  Anesthesia: digital block Local anesthetic: lidocaine 2% without epinephrine  Anesthetic total: 2 ml  Irrigation method: wound cleaner spray Amount of cleaning: standard  Skin closure: dermabond  Number of sutures: dermabond  Technique: dermabond  Patient tolerance: Patient tolerated the procedure well with no immediate complications.  Labs Review Labs Reviewed - No data to display Imaging Review No results found.  EKG Interpretation    None       MDM   1. Laceration of finger of left hand, initial encounter    Wound care instructions given.   Return here sooner for any signs of infection including redness, swelling, worse pain or drainage of pus.       Burgess Amor, PA-C 03/23/13 1052

## 2013-03-24 NOTE — ED Provider Notes (Signed)
Medical screening examination/treatment/procedure(s) were performed by non-physician practitioner and as supervising physician I was immediately available for consultation/collaboration.  EKG Interpretation   None         Laray Anger, DO 03/24/13 1040

## 2013-03-27 ENCOUNTER — Encounter (HOSPITAL_COMMUNITY): Payer: Self-pay | Admitting: Pharmacy Technician

## 2013-03-27 NOTE — Patient Instructions (Signed)
Your procedure is scheduled on: 04/04/2013  Report to [facility] at  900 AM.  Call this number if you have problems the morning of surgery: [phone]   Do not eat food or drink liquids :After Midnight.      Take these medicines the morning of surgery with A SIP OF WATER: synthroid   Do not wear jewelry, make-up or nail polish.  Do not wear lotions, powders, or perfumes.   Do not shave 48 hours prior to surgery.  Do not bring valuables to the hospital.  Contacts, dentures or bridgework may not be worn into surgery.  Leave suitcase in the car. After surgery it may be brought to your room.  For patients admitted to the hospital, checkout time is 11:00 AM the day of discharge.   Patients discharged the day of surgery will not be allowed to drive home.  :     Please read over the following fact sheets that you were given: Coughing and Deep Breathing, Surgical Site Infection Prevention, Anesthesia Post-op Instructions and Care and Recovery After Surgery    Cataract A cataract is a clouding of the lens of the eye. When a lens becomes cloudy, vision is reduced based on the degree and nature of the clouding. Many cataracts reduce vision to some degree. Some cataracts make people more near-sighted as they develop. Other cataracts increase glare. Cataracts that are ignored and become worse can sometimes look white. The white color can be seen through the pupil. CAUSES   Aging. However, cataracts may occur at any age, even in newborns.   Certain drugs.   Trauma to the eye.   Certain diseases such as diabetes.   Specific eye diseases such as chronic inflammation inside the eye or a sudden attack of a rare form of glaucoma.   Inherited or acquired medical problems.  SYMPTOMS   Gradual, progressive drop in vision in the affected eye.   Severe, rapid visual loss. This most often happens when trauma is the cause.  DIAGNOSIS  To detect a cataract, an eye doctor examines the lens. Cataracts  are best diagnosed with an exam of the eyes with the pupils enlarged (dilated) by drops.  TREATMENT  For an early cataract, vision may improve by using different eyeglasses or stronger lighting. If that does not help your vision, surgery is the only effective treatment. A cataract needs to be surgically removed when vision loss interferes with your everyday activities, such as driving, reading, or watching TV. A cataract may also have to be removed if it prevents examination or treatment of another eye problem. Surgery removes the cloudy lens and usually replaces it with a substitute lens (intraocular lens, IOL).  At a time when both you and your doctor agree, the cataract will be surgically removed. If you have cataracts in both eyes, only one is usually removed at a time. This allows the operated eye to heal and be out of danger from any possible problems after surgery (such as infection or poor wound healing). In rare cases, a cataract may be doing damage to your eye. In these cases, your caregiver may advise surgical removal right away. The vast majority of people who have cataract surgery have better vision afterward. HOME CARE INSTRUCTIONS  If you are not planning surgery, you may be asked to do the following:  Use different eyeglasses.   Use stronger or brighter lighting.   Ask your eye doctor about reducing your medicine dose or changing medicines if it  is thought that a medicine caused your cataract. Changing medicines does not make the cataract go away on its own.   Become familiar with your surroundings. Poor vision can lead to injury. Avoid bumping into things on the affected side. You are at a higher risk for tripping or falling.   Exercise extreme care when driving or operating machinery.   Wear sunglasses if you are sensitive to bright light or experiencing problems with glare.  SEEK IMMEDIATE MEDICAL CARE IF:   You have a worsening or sudden vision loss.   You notice redness,  swelling, or increasing pain in the eye.   You have a fever.  Document Released: 04/13/2005 Document Revised: 04/02/2011 Document Reviewed: 12/05/2010 [facility] [place] Patient Information 2012 [place].PATIENT INSTRUCTIONS POST-ANESTHESIA  IMMEDIATELY FOLLOWING SURGERY:  Do not drive or operate machinery for the first twenty four hours after surgery.  Do not make any important decisions for twenty four hours after surgery or while taking narcotic pain medications or sedatives.  If you develop intractable nausea and vomiting or a severe headache please notify your doctor immediately.  FOLLOW-UP:  Please make an appointment with your surgeon as instructed. You do not need to follow up with anesthesia unless specifically instructed to do so.  WOUND CARE INSTRUCTIONS (if applicable):  Keep a dry clean dressing on the anesthesia/puncture wound site if there is drainage.  Once the wound has quit draining you may leave it open to air.  Generally you should leave the bandage intact for twenty four hours unless there is drainage.  If the epidural site drains for more than 36-48 hours please call the anesthesia department.  QUESTIONS?:  Please feel free to call your physician or the hospital operator if you have any questions, and they will be happy to assist you.

## 2013-03-28 ENCOUNTER — Encounter (HOSPITAL_COMMUNITY)
Admission: RE | Admit: 2013-03-28 | Discharge: 2013-03-28 | Disposition: A | Discharge status: Home / Self Care | Payer: 59 | Source: Ambulatory Visit | Attending: Ophthalmology | Admitting: Ophthalmology

## 2013-03-28 ENCOUNTER — Encounter (HOSPITAL_COMMUNITY): Payer: Self-pay

## 2013-03-28 ENCOUNTER — Other Ambulatory Visit: Payer: Self-pay

## 2013-03-28 DIAGNOSIS — Z01818 Encounter for other preprocedural examination: Secondary | ICD-10-CM | POA: Insufficient documentation

## 2013-03-28 DIAGNOSIS — Z01810 Encounter for preprocedural cardiovascular examination: Secondary | ICD-10-CM | POA: Insufficient documentation

## 2013-03-28 HISTORY — DX: Hypothyroidism, unspecified: E03.9

## 2013-03-29 ENCOUNTER — Encounter: Payer: Self-pay | Admitting: Family Medicine

## 2013-03-29 ENCOUNTER — Encounter (HOSPITAL_COMMUNITY): Payer: Self-pay

## 2013-03-30 ENCOUNTER — Encounter (HOSPITAL_COMMUNITY): Payer: 59 | Attending: Hematology and Oncology

## 2013-03-30 DIAGNOSIS — C9000 Multiple myeloma not having achieved remission: Secondary | ICD-10-CM | POA: Insufficient documentation

## 2013-03-30 LAB — COMPREHENSIVE METABOLIC PANEL
ALT: 16 U/L (ref 0–35)
Albumin: 3.8 g/dL (ref 3.5–5.2)
Alkaline Phosphatase: 52 U/L (ref 39–117)
BUN: 16 mg/dL (ref 6–23)
Chloride: 103 mEq/L (ref 96–112)
Creatinine, Ser: 1.07 mg/dL (ref 0.50–1.10)
GFR calc Af Amer: 66 mL/min — ABNORMAL LOW (ref >90)
Glucose, Bld: 55 mg/dL — ABNORMAL LOW (ref 70–99)
Potassium: 4.3 mEq/L (ref 3.5–5.1)
Sodium: 141 mEq/L (ref 135–145)
Total Bilirubin: 0.3 mg/dL (ref 0.3–1.2)

## 2013-03-30 LAB — CBC WITH DIFFERENTIAL/PLATELET
Basophils Relative: 0 % (ref 0–1)
Hemoglobin: 13.5 g/dL (ref 12.0–15.0)
Lymphs Abs: 1 10*3/uL (ref 0.7–4.0)
Monocytes Relative: 8 % (ref 3–12)
Neutro Abs: 2.2 10*3/uL (ref 1.7–7.7)
Neutrophils Relative %: 62 % (ref 43–77)
Platelets: 216 10*3/uL (ref 150–400)
RBC: 3.99 MIL/uL (ref 3.87–5.11)

## 2013-03-30 NOTE — Progress Notes (Signed)
Labs drawn today for cbc/diff,cmp,kllc,mm 

## 2013-03-31 LAB — KAPPA/LAMBDA LIGHT CHAINS
Kappa, lambda light chain ratio: 0.9 (ref 0.26–1.65)
Lambda free light chains: 1.5 mg/dL (ref 0.57–2.63)

## 2013-04-03 LAB — MULTIPLE MYELOMA PANEL, SERUM
Alpha-1-Globulin: 3.1 % (ref 2.9–4.9)
Beta 2: 5 % (ref 3.2–6.5)
Beta Globulin: 5.3 % (ref 4.7–7.2)
Gamma Globulin: 13.4 % (ref 11.1–18.8)
M-Spike, %: NOT DETECTED g/dL

## 2013-04-04 ENCOUNTER — Ambulatory Visit (HOSPITAL_COMMUNITY)
Admission: RE | Admit: 2013-04-04 | Discharge: 2013-04-04 | Disposition: A | Discharge status: Home / Self Care | Payer: 59 | Source: Ambulatory Visit | Attending: Ophthalmology | Admitting: Ophthalmology

## 2013-04-04 ENCOUNTER — Encounter (HOSPITAL_COMMUNITY): Payer: 59 | Admitting: Anesthesiology

## 2013-04-04 ENCOUNTER — Encounter (HOSPITAL_COMMUNITY): Payer: Self-pay | Admitting: *Deleted

## 2013-04-04 ENCOUNTER — Encounter (HOSPITAL_COMMUNITY)
Admission: RE | Disposition: A | Discharge status: Home / Self Care | Payer: Self-pay | Source: Ambulatory Visit | Attending: Ophthalmology

## 2013-04-04 ENCOUNTER — Ambulatory Visit (HOSPITAL_COMMUNITY): Payer: 59 | Admitting: Anesthesiology

## 2013-04-04 DIAGNOSIS — H2510 Age-related nuclear cataract, unspecified eye: Secondary | ICD-10-CM | POA: Insufficient documentation

## 2013-04-04 HISTORY — PX: CATARACT EXTRACTION W/PHACO: SHX586

## 2013-04-04 SURGERY — PHACOEMULSIFICATION, CATARACT, WITH IOL INSERTION
Anesthesia: Monitor Anesthesia Care | Laterality: Right

## 2013-04-04 MED ORDER — ONDANSETRON HCL 4 MG/2ML IJ SOLN: 4.0000 mg | Freq: Once | INTRAMUSCULAR | Status: DC | PRN

## 2013-04-04 MED ORDER — PHENYLEPHRINE HCL 2.5 % OP SOLN
1.0000 [drp] | OPHTHALMIC | Status: AC
  Administered 2013-04-04 (×3): 1 [drp] via OPHTHALMIC

## 2013-04-04 MED ORDER — BSS IO SOLN
INTRAOCULAR | Status: DC | PRN
  Administered 2013-04-04: 15 mL via INTRAOCULAR

## 2013-04-04 MED ORDER — FENTANYL CITRATE 0.05 MG/ML IJ SOLN
25.0000 ug | INTRAMUSCULAR | Status: AC
  Administered 2013-04-04 (×2): 25 ug via INTRAVENOUS

## 2013-04-04 MED ORDER — EPINEPHRINE HCL 1 MG/ML IJ SOLN
INTRAMUSCULAR | Status: AC
  Filled 2013-04-04: qty 1

## 2013-04-04 MED ORDER — PHENYLEPHRINE HCL 2.5 % OP SOLN
OPHTHALMIC | Status: AC
  Filled 2013-04-04: qty 15

## 2013-04-04 MED ORDER — TETRACAINE HCL 0.5 % OP SOLN
1.0000 [drp] | OPHTHALMIC | Status: AC
  Administered 2013-04-04 (×3): 1 [drp] via OPHTHALMIC

## 2013-04-04 MED ORDER — KETOROLAC TROMETHAMINE 0.5 % OP SOLN
OPHTHALMIC | Status: AC
  Filled 2013-04-04: qty 5

## 2013-04-04 MED ORDER — FENTANYL CITRATE 0.05 MG/ML IJ SOLN
INTRAMUSCULAR | Status: AC
  Filled 2013-04-04: qty 2

## 2013-04-04 MED ORDER — KETOROLAC TROMETHAMINE 0.5 % OP SOLN
1.0000 [drp] | OPHTHALMIC | Status: AC
  Administered 2013-04-04 (×3): 1 [drp] via OPHTHALMIC

## 2013-04-04 MED ORDER — LACTATED RINGERS IV SOLN
INTRAVENOUS | Status: DC
  Administered 2013-04-04: 08:00:00 via INTRAVENOUS

## 2013-04-04 MED ORDER — CYCLOPENTOLATE-PHENYLEPHRINE OP SOLN OPTIME - NO CHARGE
OPHTHALMIC | Status: AC
  Filled 2013-04-04: qty 2

## 2013-04-04 MED ORDER — PROVISC 10 MG/ML IO SOLN
INTRAOCULAR | Status: DC | PRN
  Administered 2013-04-04: .55 mL via INTRAOCULAR

## 2013-04-04 MED ORDER — EPINEPHRINE HCL 1 MG/ML IJ SOLN
INTRAOCULAR | Status: DC | PRN
  Administered 2013-04-04: 08:00:00

## 2013-04-04 MED ORDER — MIDAZOLAM HCL 2 MG/2ML IJ SOLN
1.0000 mg | INTRAMUSCULAR | Status: DC | PRN
  Administered 2013-04-04: 2 mg via INTRAVENOUS

## 2013-04-04 MED ORDER — MIDAZOLAM HCL 2 MG/2ML IJ SOLN
INTRAMUSCULAR | Status: AC
  Filled 2013-04-04: qty 2

## 2013-04-04 MED ORDER — FENTANYL CITRATE 0.05 MG/ML IJ SOLN: 25.0000 ug | INTRAMUSCULAR | Status: DC | PRN

## 2013-04-04 MED ORDER — CYCLOPENTOLATE-PHENYLEPHRINE 0.2-1 % OP SOLN
1.0000 [drp] | OPHTHALMIC | Status: AC
  Administered 2013-04-04 (×3): 1 [drp] via OPHTHALMIC

## 2013-04-04 MED ORDER — TETRACAINE HCL 0.5 % OP SOLN
OPHTHALMIC | Status: AC
  Filled 2013-04-04: qty 2

## 2013-04-04 SURGICAL SUPPLY — 22 items
CAPSULAR TENSION RING-AMO (OPHTHALMIC RELATED) IMPLANT
CLOTH BEACON ORANGE TIMEOUT ST (SAFETY) ×2 IMPLANT
EYE SHIELD UNIVERSAL CLEAR (GAUZE/BANDAGES/DRESSINGS) ×2 IMPLANT
GLOVE BIO SURGEON STRL SZ 6.5 (GLOVE) ×2 IMPLANT
GLOVE ECLIPSE 6.5 STRL STRAW (GLOVE) IMPLANT
GLOVE ECLIPSE 7.0 STRL STRAW (GLOVE) IMPLANT
GLOVE EXAM NITRILE LRG STRL (GLOVE) ×2 IMPLANT
GLOVE EXAM NITRILE MD LF STRL (GLOVE) IMPLANT
GLOVE SKINSENSE NS SZ6.5 (GLOVE)
GLOVE SKINSENSE STRL SZ6.5 (GLOVE) IMPLANT
HEALON 5 0.6 ML (INTRAOCULAR LENS) IMPLANT
KIT VITRECTOMY (OPHTHALMIC RELATED) IMPLANT
LENS INTRAOCULAR RESTOR ×2 IMPLANT
PAD ARMBOARD 7.5X6 YLW CONV (MISCELLANEOUS) ×2 IMPLANT
PROC W NO LENS (INTRAOCULAR LENS)
PROC W SPEC LENS (INTRAOCULAR LENS) ×2
PROCESS W NO LENS (INTRAOCULAR LENS) IMPLANT
PROCESS W SPEC LENS (INTRAOCULAR LENS) ×1 IMPLANT
RING MALYGIN (MISCELLANEOUS) IMPLANT
TAPE PAPER MEDFIX 1IN X 10YD (GAUZE/BANDAGES/DRESSINGS) ×2 IMPLANT
VISCOELASTIC ADDITIONAL (OPHTHALMIC RELATED) IMPLANT
WATER STERILE IRR 250ML POUR (IV SOLUTION) ×2 IMPLANT

## 2013-04-04 NOTE — Anesthesia Preprocedure Evaluation (Signed)
Anesthesia Evaluation  Patient identified by MRN, date of birth, ID band Patient awake    Reviewed: Allergy & Precautions, H&P , NPO status , Patient's Chart, lab work & pertinent test results  Airway Mallampati: III TM Distance: >3 FB     Dental  (+) Teeth Intact   Pulmonary neg pulmonary ROS,  breath sounds clear to auscultation        Cardiovascular negative cardio ROS  Rhythm:Regular Rate:Normal     Neuro/Psych  Neuromuscular disease    GI/Hepatic negative GI ROS,   Endo/Other  Hypothyroidism   Renal/GU      Musculoskeletal   Abdominal   Peds  Hematology   Anesthesia Other Findings   Reproductive/Obstetrics                           Anesthesia Physical Anesthesia Plan  ASA: II  Anesthesia Plan: MAC   Post-op Pain Management:    Induction: Intravenous  Airway Management Planned: Nasal Cannula  Additional Equipment:   Intra-op Plan:   Post-operative Plan:   Informed Consent: I have reviewed the patients History and Physical, chart, labs and discussed the procedure including the risks, benefits and alternatives for the proposed anesthesia with the patient or authorized representative who has indicated his/her understanding and acceptance.     Plan Discussed with:   Anesthesia Plan Comments:         Anesthesia Quick Evaluation  

## 2013-04-04 NOTE — Transfer of Care (Signed)
Immediate Anesthesia Transfer of Care Note  Patient: Melissa [name]  Procedure(s) Performed: Procedure(s) with comments: CATARACT EXTRACTION PHACO AND INTRAOCULAR LENS PLACEMENT (IOC) (Right) - CDE:3.42  Patient Location: Short Stay  Anesthesia Type:MAC  Level of Consciousness: awake  Airway & Oxygen Therapy: Patient Spontanous Breathing  Post-op Assessment: Report given to PACU RN  Post vital signs: Reviewed  Complications: No apparent anesthesia complications

## 2013-04-04 NOTE — H&P (Signed)
The patient was re examined and there is no change in the patients condition since the original H and P. 

## 2013-04-04 NOTE — Op Note (Signed)
Patient brought to the operating room and prepped and draped in the usual manner.  Lid speculum inserted in right eye.  Stab incision made at the twelve o'clock position.  Provisc instilled in the anterior chamber.   A 2.4 mm. Stab incision was made temporally.  An anterior capsulotomy was done with a bent 25 gauge needle.  The nucleus was hydrodissected.  The Phaco tip was inserted in the anterior chamber and the nucleus was emulsified.  CDE was 3.42.  The cortical material was then removed with the I and A tip.  Posterior capsule was the polished.  The anterior chamber was deepened with Provisc.  A 21.0 Alcon [id number] Restor IOL was then inserted in the capsular bag.  Provisc was then removed with the I and A tip.  The wound was then hydrated.  Patient sent to the Recovery Room in good condition with follow up in my office.  Preoperative Diagnosis:  Nuclear Cataract OD Postoperative Diagnosis:  Same Procedure name: Kelman Phacoemulsification OD with IOL

## 2013-04-04 NOTE — Anesthesia Postprocedure Evaluation (Signed)
  Anesthesia Post-op Note  Patient: Melissa [name]  Procedure(s) Performed: Procedure(s) with comments: CATARACT EXTRACTION PHACO AND INTRAOCULAR LENS PLACEMENT (IOC) (Right) - CDE:3.42  Patient Location: Short Stay  Anesthesia Type:MAC  Level of Consciousness: awake, alert  and oriented  Airway and Oxygen Therapy: Patient Spontanous Breathing  Post-op Pain: none  Post-op Assessment: Post-op Vital signs reviewed, Patient's Cardiovascular Status Stable, Respiratory Function Stable, Patent Airway and No signs of Nausea or vomiting  Post-op Vital Signs: Reviewed and stable  Complications: No apparent anesthesia complications

## 2013-04-05 ENCOUNTER — Encounter (HOSPITAL_COMMUNITY): Payer: Self-pay | Admitting: Pharmacy Technician

## 2013-04-05 ENCOUNTER — Encounter (HOSPITAL_COMMUNITY): Payer: Self-pay | Admitting: Ophthalmology

## 2013-04-11 ENCOUNTER — Encounter (HOSPITAL_COMMUNITY)
Admission: RE | Admit: 2013-04-11 | Discharge: 2013-04-11 | Disposition: A | Discharge status: Home / Self Care | Payer: 59 | Source: Ambulatory Visit | Attending: Ophthalmology | Admitting: Ophthalmology

## 2013-04-11 NOTE — Progress Notes (Signed)
Patient returned call. Patient informed on NPO status and to arrive at 0630. Voiced understanding.

## 2013-04-17 ENCOUNTER — Ambulatory Visit (INDEPENDENT_AMBULATORY_CARE_PROVIDER_SITE_OTHER): Payer: 59 | Admitting: Family Medicine

## 2013-04-17 ENCOUNTER — Encounter: Payer: Self-pay | Admitting: Family Medicine

## 2013-04-17 VITALS — BP 140/90 | Ht 61.0 in | Wt 144.1 lb

## 2013-04-17 DIAGNOSIS — R030 Elevated blood-pressure reading, without diagnosis of hypertension: Secondary | ICD-10-CM

## 2013-04-17 DIAGNOSIS — IMO0001 Reserved for inherently not codable concepts without codable children: Secondary | ICD-10-CM

## 2013-04-17 DIAGNOSIS — Z0189 Encounter for other specified special examinations: Secondary | ICD-10-CM

## 2013-04-17 DIAGNOSIS — E039 Hypothyroidism, unspecified: Secondary | ICD-10-CM

## 2013-04-17 NOTE — Progress Notes (Signed)
   Subjective:    Patient ID: Melissa [name], female    DOB: December 01, 1955, 57 y.o.   MRN: [id number]  Hypertension This is a new problem. The current episode started more than 1 month ago. The problem has been gradually worsening since onset. The problem is uncontrolled. There are no associated agents to hypertension. There are no known risk factors for coronary artery disease. Past treatments include nothing. The current treatment provides no improvement. There are no compliance problems.    Family history HTN Try to stay healthy and exercise have not been doing much up until now  Review of Systems Denies any other particular problems. No high fevers wheezing difficulty breathing et Karie Soda.    Objective:   Physical Exam Lungs clear hearts regular pulse normal blood pressure 134/84       Assessment & Plan:  HTN stage I best approach dietary measures exercise followup if ongoing troubles

## 2013-04-17 NOTE — Patient Instructions (Signed)
DASH Diet  The DASH diet stands for "Dietary Approaches to Stop Hypertension." It is a healthy eating plan that has been shown to reduce high blood pressure (hypertension) in as little as 14 days, while also possibly providing other significant health benefits. These other health benefits include reducing the risk of breast cancer after menopause and reducing the risk of type 2 diabetes, heart disease, colon cancer, and stroke. Health benefits also include weight loss and slowing kidney failure in patients with chronic kidney disease.   DIET GUIDELINES  · Limit salt (sodium). Your diet should contain less than 1500 mg of sodium daily.  · Limit refined or processed carbohydrates. Your diet should include mostly whole grains. Desserts and added sugars should be used sparingly.  · Include small amounts of heart-healthy fats. These types of fats include nuts, oils, and tub margarine. Limit saturated and trans fats. These fats have been shown to be harmful in the body.  CHOOSING FOODS   The following food groups are based on a 2000 calorie diet. See your Registered Dietitian for individual calorie needs.  Grains and Grain Products (6 to 8 servings daily)  · Eat More Often: Whole-wheat bread, brown rice, whole-grain or wheat pasta, quinoa, popcorn without added fat or salt (air popped).  · Eat Less Often: White bread, white pasta, white rice, cornbread.  Vegetables (4 to 5 servings daily)  · Eat More Often: Fresh, frozen, and canned vegetables. Vegetables may be raw, steamed, roasted, or grilled with a minimal amount of fat.  · Eat Less Often/Avoid: Creamed or fried vegetables. Vegetables in a cheese sauce.  Fruit (4 to 5 servings daily)  · Eat More Often: All fresh, canned (in natural juice), or frozen fruits. Dried fruits without added sugar. One hundred percent fruit juice (½ cup [237 mL] daily).  · Eat Less Often: Dried fruits with added sugar. Canned fruit in light or heavy syrup.  Lean Meats, Fish, and Poultry (2  servings or less daily. One serving is 3 to 4 oz [85-114 g]).  · Eat More Often: Ninety percent or leaner ground beef, tenderloin, sirloin. Round cuts of beef, chicken breast, turkey breast. All fish. Grill, bake, or broil your meat. Nothing should be fried.  · Eat Less Often/Avoid: Fatty cuts of meat, turkey, or chicken leg, thigh, or wing. Fried cuts of meat or fish.  Dairy (2 to 3 servings)  · Eat More Often: Low-fat or fat-free milk, low-fat plain or light yogurt, reduced-fat or part-skim cheese.  · Eat Less Often/Avoid: Milk (whole, 2%). Whole milk yogurt. Full-fat cheeses.  Nuts, Seeds, and Legumes (4 to 5 servings per week)  · Eat More Often: All without added salt.  · Eat Less Often/Avoid: Salted nuts and seeds, canned beans with added salt.  Fats and Sweets (limited)  · Eat More Often: Vegetable oils, tub margarines without trans fats, sugar-free gelatin. Mayonnaise and salad dressings.  · Eat Less Often/Avoid: Coconut oils, palm oils, butter, stick margarine, cream, half and half, cookies, candy, pie.  FOR MORE INFORMATION  The Dash Diet Eating Plan: www.dashdiet.org  Document Released: 04/02/2011 Document Revised: 07/06/2011 Document Reviewed: 04/02/2011  ExitCare® Patient Information ©2014 [employer].

## 2013-04-18 ENCOUNTER — Encounter (HOSPITAL_COMMUNITY): Payer: 59 | Admitting: Anesthesiology

## 2013-04-18 ENCOUNTER — Encounter (HOSPITAL_COMMUNITY): Payer: Self-pay | Admitting: *Deleted

## 2013-04-18 ENCOUNTER — Ambulatory Visit (HOSPITAL_COMMUNITY)
Admission: RE | Admit: 2013-04-18 | Discharge: 2013-04-18 | Disposition: A | Discharge status: Home / Self Care | Payer: 59 | Source: Ambulatory Visit | Attending: Ophthalmology | Admitting: Ophthalmology

## 2013-04-18 ENCOUNTER — Ambulatory Visit (HOSPITAL_COMMUNITY): Payer: 59 | Admitting: Anesthesiology

## 2013-04-18 ENCOUNTER — Encounter (HOSPITAL_COMMUNITY)
Admission: RE | Disposition: A | Discharge status: Home / Self Care | Payer: Self-pay | Source: Ambulatory Visit | Attending: Ophthalmology

## 2013-04-18 DIAGNOSIS — H2510 Age-related nuclear cataract, unspecified eye: Secondary | ICD-10-CM | POA: Insufficient documentation

## 2013-04-18 DIAGNOSIS — Z01812 Encounter for preprocedural laboratory examination: Secondary | ICD-10-CM | POA: Diagnosis not present

## 2013-04-18 DIAGNOSIS — Z79899 Other long term (current) drug therapy: Secondary | ICD-10-CM | POA: Diagnosis not present

## 2013-04-18 HISTORY — PX: CATARACT EXTRACTION W/PHACO: SHX586

## 2013-04-18 SURGERY — PHACOEMULSIFICATION, CATARACT, WITH IOL INSERTION
Anesthesia: Monitor Anesthesia Care | Laterality: Left

## 2013-04-18 MED ORDER — EPINEPHRINE HCL 1 MG/ML IJ SOLN
INTRAOCULAR | Status: DC | PRN
  Administered 2013-04-18: 08:00:00

## 2013-04-18 MED ORDER — MIDAZOLAM HCL 2 MG/2ML IJ SOLN
1.0000 mg | INTRAMUSCULAR | Status: DC | PRN
  Administered 2013-04-18: 2 mg via INTRAVENOUS

## 2013-04-18 MED ORDER — BSS IO SOLN
INTRAOCULAR | Status: DC | PRN
  Administered 2013-04-18: 15 mL via INTRAOCULAR

## 2013-04-18 MED ORDER — MIDAZOLAM HCL 2 MG/2ML IJ SOLN
INTRAMUSCULAR | Status: AC
  Filled 2013-04-18: qty 2

## 2013-04-18 MED ORDER — PHENYLEPHRINE HCL 2.5 % OP SOLN
OPHTHALMIC | Status: AC
  Filled 2013-04-18: qty 15

## 2013-04-18 MED ORDER — EPINEPHRINE HCL 1 MG/ML IJ SOLN
INTRAMUSCULAR | Status: AC
  Filled 2013-04-18: qty 1

## 2013-04-18 MED ORDER — KETOROLAC TROMETHAMINE 0.5 % OP SOLN
1.0000 [drp] | OPHTHALMIC | Status: AC
  Administered 2013-04-18 (×3): 1 [drp] via OPHTHALMIC
  Filled 2013-04-18: qty 5

## 2013-04-18 MED ORDER — CYCLOPENTOLATE-PHENYLEPHRINE OP SOLN OPTIME - NO CHARGE
OPHTHALMIC | Status: AC
  Filled 2013-04-18: qty 2

## 2013-04-18 MED ORDER — FENTANYL CITRATE 0.05 MG/ML IJ SOLN: 25.0000 ug | INTRAMUSCULAR | Status: DC | PRN

## 2013-04-18 MED ORDER — TETRACAINE HCL 0.5 % OP SOLN
1.0000 [drp] | OPHTHALMIC | Status: AC
  Administered 2013-04-18 (×3): 1 [drp] via OPHTHALMIC
  Filled 2013-04-18: qty 2

## 2013-04-18 MED ORDER — PHENYLEPHRINE HCL 2.5 % OP SOLN
1.0000 [drp] | OPHTHALMIC | Status: AC
  Administered 2013-04-18 (×3): 1 [drp] via OPHTHALMIC

## 2013-04-18 MED ORDER — FENTANYL CITRATE 0.05 MG/ML IJ SOLN
25.0000 ug | INTRAMUSCULAR | Status: AC
  Administered 2013-04-18 (×2): 25 ug via INTRAVENOUS

## 2013-04-18 MED ORDER — CYCLOPENTOLATE-PHENYLEPHRINE 0.2-1 % OP SOLN
1.0000 [drp] | OPHTHALMIC | Status: AC
  Administered 2013-04-18 (×3): 1 [drp] via OPHTHALMIC

## 2013-04-18 MED ORDER — NA HYALUR & NA CHOND-NA HYALUR 0.55-0.5 ML IO KIT
PACK | INTRAOCULAR | Status: DC | PRN
  Administered 2013-04-18: 1 via OPHTHALMIC

## 2013-04-18 MED ORDER — LACTATED RINGERS IV SOLN
INTRAVENOUS | Status: DC
  Administered 2013-04-18: 08:00:00 via INTRAVENOUS

## 2013-04-18 MED ORDER — ONDANSETRON HCL 4 MG/2ML IJ SOLN: 4.0000 mg | Freq: Once | INTRAMUSCULAR | Status: DC | PRN

## 2013-04-18 MED ORDER — KETOROLAC TROMETHAMINE 0.5 % OP SOLN
OPHTHALMIC | Status: AC
  Filled 2013-04-18: qty 5

## 2013-04-18 MED ORDER — TETRACAINE HCL 0.5 % OP SOLN
OPHTHALMIC | Status: AC
  Filled 2013-04-18: qty 2

## 2013-04-18 MED ORDER — FENTANYL CITRATE 0.05 MG/ML IJ SOLN
INTRAMUSCULAR | Status: AC
  Filled 2013-04-18: qty 2

## 2013-04-18 SURGICAL SUPPLY — 27 items
CAPSULAR TENSION RING-AMO (OPHTHALMIC RELATED) IMPLANT
CLOTH BEACON ORANGE TIMEOUT ST (SAFETY) ×2 IMPLANT
EYE SHIELD UNIVERSAL CLEAR (GAUZE/BANDAGES/DRESSINGS) ×2 IMPLANT
GLOVE BIO SURGEON STRL SZ 6.5 (GLOVE) IMPLANT
GLOVE BIOGEL PI IND STRL 6.5 (GLOVE) ×1 IMPLANT
GLOVE BIOGEL PI IND STRL 7.0 (GLOVE) IMPLANT
GLOVE BIOGEL PI INDICATOR 6.5 (GLOVE) ×1
GLOVE BIOGEL PI INDICATOR 7.0 (GLOVE)
GLOVE ECLIPSE 6.5 STRL STRAW (GLOVE) IMPLANT
GLOVE ECLIPSE 7.0 STRL STRAW (GLOVE) IMPLANT
GLOVE EXAM NITRILE LRG STRL (GLOVE) IMPLANT
GLOVE EXAM NITRILE MD LF STRL (GLOVE) ×2 IMPLANT
GLOVE SKINSENSE NS SZ6.5 (GLOVE)
GLOVE SKINSENSE STRL SZ6.5 (GLOVE) IMPLANT
HEALON 5 0.6 ML (INTRAOCULAR LENS) IMPLANT
KIT VITRECTOMY (OPHTHALMIC RELATED) IMPLANT
LENS INTRAOCULAR RESTOR ×2 IMPLANT
PAD ARMBOARD 7.5X6 YLW CONV (MISCELLANEOUS) ×2 IMPLANT
PROC W NO LENS (INTRAOCULAR LENS)
PROC W SPEC LENS (INTRAOCULAR LENS) ×2
PROCESS W NO LENS (INTRAOCULAR LENS) IMPLANT
PROCESS W SPEC LENS (INTRAOCULAR LENS) ×1 IMPLANT
RING MALYGIN (MISCELLANEOUS) IMPLANT
TAPE SURG TRANSPORE 1 IN (GAUZE/BANDAGES/DRESSINGS) ×1 IMPLANT
TAPE SURGICAL TRANSPORE 1 IN (GAUZE/BANDAGES/DRESSINGS) ×1
VISCOELASTIC ADDITIONAL (OPHTHALMIC RELATED) IMPLANT
WATER STERILE IRR 250ML POUR (IV SOLUTION) ×2 IMPLANT

## 2013-04-18 NOTE — Op Note (Signed)
Patient brought to the operating room and prepped and draped in the usual manner.  Lid speculum inserted in left eye.  Stab incision made at the twelve o'clock position.  Provisc instilled in the anterior chamber.   A 2.4 mm. Stab incision was made temporally.  An anterior capsulotomy was done with a bent 25 gauge needle.  The nucleus was hydrodissected.  The Phaco tip was inserted in the anterior chamber and the nucleus was emulsified.  CDE was 4.91.  The cortical material was then removed with the I and A tip.  Posterior capsule was the polished.  The anterior chamber was deepened with Provisc.  A 21.5 Diopter Alcon [id number] Restor IOL was then inserted in the capsular bag.  Provisc was then removed with the I and A tip. Two 2.75 mm. PLRI's were done at the 48 degree postion.   The wound was then hydrated.  Patient sent to the Recovery Room in good condition with follow up in my office.  Preoperative Diagnosis:  Nuclear Cataract OS Postoperative Diagnosis:  Same Procedure name: Kelman Phacoemulsification OS with IOL

## 2013-04-18 NOTE — Anesthesia Postprocedure Evaluation (Signed)
  Anesthesia Post-op Note  Patient: Melissa [name]  Procedure(s) Performed: Procedure(s) with comments: CATARACT EXTRACTION PHACO AND INTRAOCULAR LENS PLACEMENT (IOC) (Left) - CDE:4.91  Patient Location: Short Stay  Anesthesia Type:MAC  Level of Consciousness: awake, alert  and oriented  Airway and Oxygen Therapy: Patient Spontanous Breathing  Post-op Pain: none  Post-op Assessment: Post-op Vital signs reviewed, Patient's Cardiovascular Status Stable, Respiratory Function Stable, Patent Airway and No signs of Nausea or vomiting  Post-op Vital Signs: Reviewed and stable  Complications: No apparent anesthesia complications

## 2013-04-18 NOTE — H&P (Signed)
The patient was re examined and there is no change in the patients condition since the original H and P. 

## 2013-04-18 NOTE — Transfer of Care (Signed)
Immediate Anesthesia Transfer of Care Note  Patient: Melissa [name] Pop  Procedure(s) Performed: Procedure(s) with comments: CATARACT EXTRACTION PHACO AND INTRAOCULAR LENS PLACEMENT (IOC) (Left) - CDE:4.91  Patient Location: Short Stay  Anesthesia Type:MAC  Level of Consciousness: awake  Airway & Oxygen Therapy: Patient Spontanous Breathing  Post-op Assessment: Report given to PACU RN  Post vital signs: Reviewed  Complications: No apparent anesthesia complications

## 2013-04-18 NOTE — Anesthesia Preprocedure Evaluation (Signed)
Anesthesia Evaluation  Patient identified by MRN, date of birth, ID band Patient awake    Reviewed: Allergy & Precautions, H&P , NPO status , Patient's Chart, lab work & pertinent test results  Airway Mallampati: III TM Distance: >3 FB     Dental  (+) Teeth Intact   Pulmonary neg pulmonary ROS,  breath sounds clear to auscultation        Cardiovascular negative cardio ROS  Rhythm:Regular Rate:Normal     Neuro/Psych  Neuromuscular disease    GI/Hepatic negative GI ROS,   Endo/Other  Hypothyroidism   Renal/GU      Musculoskeletal   Abdominal   Peds  Hematology   Anesthesia Other Findings   Reproductive/Obstetrics                           Anesthesia Physical Anesthesia Plan  ASA: II  Anesthesia Plan: MAC   Post-op Pain Management:    Induction: Intravenous  Airway Management Planned: Nasal Cannula  Additional Equipment:   Intra-op Plan:   Post-operative Plan:   Informed Consent: I have reviewed the patients History and Physical, chart, labs and discussed the procedure including the risks, benefits and alternatives for the proposed anesthesia with the patient or authorized representative who has indicated his/her understanding and acceptance.     Plan Discussed with:   Anesthesia Plan Comments:         Anesthesia Quick Evaluation

## 2013-04-24 ENCOUNTER — Encounter (HOSPITAL_COMMUNITY): Payer: Self-pay | Admitting: Ophthalmology

## 2013-04-28 ENCOUNTER — Encounter (HOSPITAL_COMMUNITY): Payer: 59 | Attending: Hematology and Oncology

## 2013-04-28 DIAGNOSIS — C9000 Multiple myeloma not having achieved remission: Secondary | ICD-10-CM | POA: Insufficient documentation

## 2013-05-01 LAB — COMPREHENSIVE METABOLIC PANEL
ALT: 18 U/L (ref 0–35)
AST: 21 U/L (ref 0–37)
Albumin: 4.1 g/dL (ref 3.5–5.2)
Alkaline Phosphatase: 57 U/L (ref 39–117)
BUN: 15 mg/dL (ref 6–23)
CHLORIDE: 103 meq/L (ref 96–112)
CO2: 30 mEq/L (ref 19–32)
Calcium: 9.7 mg/dL (ref 8.4–10.5)
Creatinine, Ser: 1.03 mg/dL (ref 0.50–1.10)
GFR, EST AFRICAN AMERICAN: 70 mL/min — AB (ref >90)
GFR, EST NON AFRICAN AMERICAN: 60 mL/min — AB (ref >90)
GLUCOSE: 85 mg/dL (ref 70–99)
Potassium: 4.2 mEq/L (ref 3.7–5.3)
Sodium: 142 mEq/L (ref 137–147)
Total Bilirubin: 0.4 mg/dL (ref 0.3–1.2)
Total Protein: 7.4 g/dL (ref 6.0–8.3)

## 2013-05-01 LAB — LIPID PANEL
Cholesterol: 170 mg/dL (ref 0–200)
HDL: 56 mg/dL (ref >39)
LDL Cholesterol: 101 mg/dL — ABNORMAL HIGH (ref 0–99)
Total CHOL/HDL Ratio: 3 Ratio
Triglycerides: 66 mg/dL (ref <150)
VLDL: 13 mg/dL (ref 0–40)

## 2013-05-01 LAB — CBC WITH DIFFERENTIAL/PLATELET
BASOS ABS: 0 10*3/uL (ref 0.0–0.1)
Basophils Relative: 1 % (ref 0–1)
Eosinophils Absolute: 0.1 10*3/uL (ref 0.0–0.7)
Eosinophils Relative: 3 % (ref 0–5)
HEMATOCRIT: 41.3 % (ref 36.0–46.0)
HEMOGLOBIN: 14.4 g/dL (ref 12.0–15.0)
LYMPHS ABS: 0.7 10*3/uL (ref 0.7–4.0)
Lymphocytes Relative: 26 % (ref 12–46)
MCH: 33.9 pg (ref 26.0–34.0)
MCHC: 34.9 g/dL (ref 30.0–36.0)
MCV: 97.2 fL (ref 78.0–100.0)
MONO ABS: 0.3 10*3/uL (ref 0.1–1.0)
Monocytes Relative: 11 % (ref 3–12)
NEUTROS ABS: 1.7 10*3/uL (ref 1.7–7.7)
Neutrophils Relative %: 59 % (ref 43–77)
Platelets: 201 10*3/uL (ref 150–400)
RBC: 4.25 MIL/uL (ref 3.87–5.11)
RDW: 12.4 % (ref 11.5–15.5)
WBC: 2.8 10*3/uL — AB (ref 4.0–10.5)

## 2013-05-01 LAB — TSH: TSH: 0.57 u[IU]/mL (ref 0.350–4.500)

## 2013-05-02 ENCOUNTER — Encounter: Payer: Self-pay | Admitting: Family Medicine

## 2013-05-02 LAB — KAPPA/LAMBDA LIGHT CHAINS
Kappa free light chain: 1.25 mg/dL (ref 0.33–1.94)
Kappa, lambda light chain ratio: 0.41 (ref 0.26–1.65)
Lambda free light chains: 3.07 mg/dL — ABNORMAL HIGH (ref 0.57–2.63)

## 2013-05-03 LAB — MULTIPLE MYELOMA PANEL, SERUM
ALBUMIN ELP: 61.5 % (ref 55.8–66.1)
ALPHA-1-GLOBULIN: 3 % (ref 2.9–4.9)
Alpha-2-Globulin: 11.7 % (ref 7.1–11.8)
Beta 2: 5.1 % (ref 3.2–6.5)
Beta Globulin: 5.4 % (ref 4.7–7.2)
Gamma Globulin: 13.3 % (ref 11.1–18.8)
IGG (IMMUNOGLOBIN G), SERUM: 883 mg/dL (ref 690–1700)
IGM, SERUM: 26 mg/dL — AB (ref 52–322)
IgA: 131 mg/dL (ref 69–380)
M-Spike, %: NOT DETECTED g/dL
TOTAL PROTEIN: 6.5 g/dL (ref 6.0–8.3)

## 2013-05-11 ENCOUNTER — Ambulatory Visit (HOSPITAL_COMMUNITY): Payer: 59

## 2013-05-16 ENCOUNTER — Ambulatory Visit (HOSPITAL_COMMUNITY): Payer: 59

## 2013-05-22 ENCOUNTER — Encounter (HOSPITAL_COMMUNITY): Payer: Self-pay

## 2013-05-22 ENCOUNTER — Encounter (HOSPITAL_BASED_OUTPATIENT_CLINIC_OR_DEPARTMENT_OTHER): Payer: 59

## 2013-05-22 VITALS — BP 123/76 | HR 96 | Temp 97.8°F | Resp 14 | Wt 144.4 lb

## 2013-05-22 DIAGNOSIS — C9000 Multiple myeloma not having achieved remission: Secondary | ICD-10-CM

## 2013-05-22 DIAGNOSIS — G609 Hereditary and idiopathic neuropathy, unspecified: Secondary | ICD-10-CM

## 2013-05-22 NOTE — Patient Instructions (Signed)
[facility] Discharge Instructions  RECOMMENDATIONS MADE BY THE CONSULTANT AND ANY TEST RESULTS WILL BE SENT TO YOUR REFERRING PHYSICIAN.  EXAM FINDINGS BY THE PHYSICIAN TODAY AND SIGNS OR SYMPTOMS TO REPORT TO CLINIC OR PRIMARY PHYSICIAN: Exam and findings as discussed by Dr. Barnet Glasgow.  You are doing well.  Report bone pain, shortness of breath or other problems.  MEDICATIONS PRESCRIBED:  none  INSTRUCTIONS/FOLLOW-UP: Lab work as ordered by Dr. Marcell Anger and follow-up with MD in December 2015.  Thank you for choosing [facility] to provide your oncology and hematology care.  To afford each patient quality time with our providers, please arrive at least 15 minutes before your scheduled appointment time.  With your help, our goal is to use those 15 minutes to complete the necessary work-up to ensure our physicians have the information they need to help with your evaluation and healthcare recommendations.    Effective January 1st, 2014, we ask that you re-schedule your appointment with our physicians should you arrive 10 or more minutes late for your appointment.  We strive to give you quality time with our providers, and arriving late affects you and other patients whose appointments are after yours.    Again, thank you for choosing [facility].  Our hope is that these requests will decrease the amount of time that you wait before being seen by our physicians.       _____________________________________________________________  Should you have questions after your visit to [facility], please contact our office at [phone] between the hours of 8:30 a.m. and 5:00 p.m.  Voicemails left after 4:30 p.m. will not be returned until the following business day.  For prescription refill requests, have your pharmacy contact our office with your prescription refill request.

## 2013-05-22 NOTE — Progress Notes (Signed)
[facility]  OFFICE PROGRESS NOTE  [name], MD [address] [place] [address]  DIAGNOSIS: Multiple myeloma  Chief Complaint  Patient presents with  . Multiple Myeloma    Status post autologous stem cell transplant, maintenance Revlimid    CURRENT THERAPY: Revlimid one week on 1 week off as maintenance therapy  INTERVAL HISTORY: Melissa [name] 58 y.o. female returns for followup of multiple myeloma, status post autologous stem cell transplant, currently on Revlimid maintenance one week on 1 week off.  Per patient preference and with the acknowledgment of the group at [facility], the patient stopped Revlimid in November of 2014. She is getting lab work done every 2 months. Appetite is good with no nausea, vomiting, fever, night sweats, epistaxis, melena, hematochezia, hematuria, lower extremity swelling or redness, cough, wheezing, sore throat, skin rash, but with residual paresthesias particularly involving the toes. She has no difficulty with upper extremity neuropathy. She denies any chest pain, PND, orthopnea, palpitations, or significant bone pain requiring analgesics.  MEDICAL HISTORY: Past Medical History  Diagnosis Date  . Multiple myeloma   . Thyroid disease   . Peripheral neuropathy   . Hypothyroidism   . Hypertension 03/2013    stage 1    INTERIM HISTORY: has Multiple myeloma and Unspecified hypothyroidism on her problem list.   MRI was eventually ordered on 10/05/2008 which showed T6 fracture and she went on to receive radiation therapy from T1-T7 after diagnosis of multiple myeloma was made. She was treated with Velcade on days 1, 4, 8 and 11 and went on to receive 4 cycles of therapy. She was believed to have had excellent response to Velcade therapy and was evaluated for stem cell transplant .   She was given melphalan 245m/m2 and 03/04/09 and had stem service Infusaid 03/06/2009.  Six  month post transplant evaluation (09/03/2010) showed no evidence of recurrent diseaseTwo faint oligoclonal bands: 0.15 gm/dl and 0.11 gm/dl; IFIX IgG Kappa x 2; IgG 899 IgA 78 IgM < 25 Kappa 16 Lambda 10.9. 1 year post transplant evaluation (03/04/2011) showed no evidence of recurrent disease.SPEP - 0.35 gm/dl IFIX - IgG Kappa x 2; IgG 1186 IgA 104 IgM 27 Kappa 25.4 Lambda 15.1 She has been followed on a regular basis with out evidence of disease progression.  She states that presently she takes 5 mg of Revlimid 7 days on 7 days off which was stopped by mutual consent in November of 2014. She no longer takes Zometa since there is no evidence of myeloma.  ALLERGIES:  is allergic to hydromorphone hcl and tape.  MEDICATIONS: has a current medication list which includes the following prescription(s): bromfenac sodium, calcium carbonate-vitamin d, vitamin d, levothyroxine, and ofloxacin, and the following Facility-Administered Medications: sodium chloride, sodium chloride, and sodium chloride.  SURGICAL HISTORY:  Past Surgical History  Procedure Laterality Date  . Tubal ligation    . Tonsilectomy, adenoidectomy, bilateral myringotomy and tubes    . Cesarean section  06/1993  . Cataract extraction w/phaco Right 04/04/2013    Procedure: CATARACT EXTRACTION PHACO AND INTRAOCULAR LENS PLACEMENT (IOC);  Surgeon: MElta GuadeloupeT. SGershon Crane MD;  Location: [facility];  Service: Ophthalmology;  Laterality: Right;  CDE:3.42  . Cataract extraction w/phaco Left 04/18/2013    Procedure: CATARACT EXTRACTION PHACO AND INTRAOCULAR LENS PLACEMENT (IOC);  Surgeon: MElta GuadeloupeT. SGershon Crane MD;  Location: [facility];  Service: Ophthalmology;  Laterality: Left;  CDE:4.91    FAMILY HISTORY:  family history includes Heart attack in her father and mother; Heart disease in her maternal grandmother; Hypertension in her brother, brother, father, mother, and sister; Stroke in her maternal grandfather.  SOCIAL HISTORY:  reports that she has never smoked. She  has never used smokeless tobacco. She reports that she drinks alcohol. She reports that she does not use illicit drugs.  REVIEW OF SYSTEMS:  Other than that discussed above is noncontributory.  PHYSICAL EXAMINATION: ECOG PERFORMANCE STATUS: 1 - Symptomatic but completely ambulatory  Blood pressure 123/76, pulse 96, temperature 97.8 F (36.6 C), temperature source Oral, resp. rate 14, weight 144 lb 6.4 oz (65.499 kg).  GENERAL:alert, no distress and comfortable SKIN: skin color, texture, turgor are normal, no rashes or significant lesions EYES: PERLA; Conjunctiva are pink and non-injected, sclera clear OROPHARYNX:no exudate, no erythema on lips, buccal mucosa, or tongue. NECK: supple, thyroid normal size, non-tender, without nodularity. No masses CHEST: Normal AP diameter with no breast masses. LYMPH:  no palpable lymphadenopathy in the cervical, axillary or inguinal LUNGS: clear to auscultation and percussion with normal breathing effort HEART: regular rate & rhythm and no murmurs. ABDOMEN:abdomen soft, non-tender and normal bowel sounds MUSCULOSKELETAL:no cyanosis of digits and no clubbing. Range of motion normal.  NEURO: alert & oriented x 3 with fluent speech, no focal motor/sensory deficits. DTRs are normal.   LABORATORY DATA: Appointment on 04/28/2013  Component Date Value Range Status  . WBC 05/01/2013 2.8* 4.0 - 10.5 K/uL Final  . RBC 05/01/2013 4.25  3.87 - 5.11 MIL/uL Final  . Hemoglobin 05/01/2013 14.4  12.0 - 15.0 g/dL Final  . HCT 05/01/2013 41.3  36.0 - 46.0 % Final  . MCV 05/01/2013 97.2  78.0 - 100.0 fL Final  . MCH 05/01/2013 33.9  26.0 - 34.0 pg Final  . MCHC 05/01/2013 34.9  30.0 - 36.0 g/dL Final  . RDW 05/01/2013 12.4  11.5 - 15.5 % Final  . Platelets 05/01/2013 201  150 - 400 K/uL Final  . Neutrophils Relative % 05/01/2013 59  43 - 77 % Final  . Neutro Abs 05/01/2013 1.7  1.7 - 7.7 K/uL Final  . Lymphocytes Relative 05/01/2013 26  12 - 46 % Final  . Lymphs  Abs 05/01/2013 0.7  0.7 - 4.0 K/uL Final  . Monocytes Relative 05/01/2013 11  3 - 12 % Final  . Monocytes Absolute 05/01/2013 0.3  0.1 - 1.0 K/uL Final  . Eosinophils Relative 05/01/2013 3  0 - 5 % Final  . Eosinophils Absolute 05/01/2013 0.1  0.0 - 0.7 K/uL Final  . Basophils Relative 05/01/2013 1  0 - 1 % Final  . Basophils Absolute 05/01/2013 0.0  0.0 - 0.1 K/uL Final  . Sodium 05/01/2013 142  137 - 147 mEq/L Final  . Potassium 05/01/2013 4.2  3.7 - 5.3 mEq/L Final  . Chloride 05/01/2013 103  96 - 112 mEq/L Final  . CO2 05/01/2013 30  19 - 32 mEq/L Final  . Glucose, Bld 05/01/2013 85  70 - 99 mg/dL Final  . BUN 05/01/2013 15  6 - 23 mg/dL Final  . Creatinine, Ser 05/01/2013 1.03  0.50 - 1.10 mg/dL Final  . Calcium 05/01/2013 9.7  8.4 - 10.5 mg/dL Final  . Total Protein 05/01/2013 7.4  6.0 - 8.3 g/dL Final  . Albumin 05/01/2013 4.1  3.5 - 5.2 g/dL Final  . AST 05/01/2013 21  0 - 37 U/L Final  . ALT 05/01/2013 18  0 - 35 U/L Final  . Alkaline  Phosphatase 05/01/2013 57  39 - 117 U/L Final  . Total Bilirubin 05/01/2013 0.4  0.3 - 1.2 mg/dL Final  . GFR calc non Af Amer 05/01/2013 60* >90 mL/min Final  . GFR calc Af Amer 05/01/2013 70* >90 mL/min Final   Comment: (NOTE)                          The eGFR has been calculated using the CKD EPI equation.                          This calculation has not been validated in all clinical situations.                          eGFR's persistently <90 mL/min signify possible Chronic Kidney                          Disease.  . Kappa free light chain 05/01/2013 1.25  0.33 - 1.94 mg/dL Final  . Lamda free light chains 05/01/2013 3.07* 0.57 - 2.63 mg/dL Final  . Kappa, lamda light chain ratio 05/01/2013 0.41  0.26 - 1.65 Final   Performed at [facility]  . Total Protein 05/01/2013 6.5  6.0 - 8.3 g/dL Final  . Albumin ELP 05/01/2013 61.5  55.8 - 66.1 % Final  . Alpha-1-Globulin 05/01/2013 3.0  2.9 - 4.9 % Final  . Alpha-2-Globulin 05/01/2013  11.7  7.1 - 11.8 % Final  . Beta Globulin 05/01/2013 5.4  4.7 - 7.2 % Final  . Beta 2 05/01/2013 5.1  3.2 - 6.5 % Final  . Gamma Globulin 05/01/2013 13.3  11.1 - 18.8 % Final  . M-Spike, % 05/01/2013 NOT DETECTED   Final  . SPE Interp. 05/01/2013 (NOTE)   Final   Comment: The possibility of a faint restricted band(s) cannot be completely                          excluded in the gamma region.                          Results are consistent with SPE performed on 03/31/2013  Reviewed by                          Odis Hollingshead, MD, PhD, FCAP (Electronic Signature on File)  . Comment 05/01/2013 (NOTE)   Final   Comment: ---------------                          Serum protein electrophoresis is a useful screening procedure in the                          detection of various pathophysiologic states such as inflammation,                          gammopathies, protein loss and other dysproteinemias.  Immunofixation                          electrophoresis (IFE) is a more sensitive technique for the  identification of M-proteins found in patients with monoclonal                          gammopathy of unknown significance (MGUS), amyloidosis, early or                          treated myeloma or macroglobulinemia, solitary plasmacytoma or                          extramedullary plasmacytoma.  . IgG (Immunoglobin G), Serum 05/01/2013 883  690 - 1700 mg/dL Final  . IgA 05/01/2013 131  69 - 380 mg/dL Final  . IgM, Serum 05/01/2013 26* 52 - 322 mg/dL Final  . Immunofix Electr Int 05/01/2013 (NOTE)   Final   Comment: Area of slightly restricted mobility in the IgG and Kappa lanes.                          Suggest repeat in 6-8 months, if clinically indicated.                          Reviewed by Janice J. Hessling, MD, PhD, FCAP (Electronic Signature on                          File)                          Performed at [facility]    PATHOLOGY: No new  pathology.  Urinalysis    Component Value Date/Time   COLORURINE YELLOW 08/03/2009 1550   APPEARANCEUR CLEAR 08/03/2009 1550   LABSPEC 1.010 08/03/2009 1550   PHURINE 6.5 08/03/2009 1550   GLUCOSEU NEGATIVE 08/03/2009 1550   HGBUR NEGATIVE 08/03/2009 1550   BILIRUBINUR NEGATIVE 08/03/2009 1550   KETONESUR NEGATIVE 08/03/2009 1550   PROTEINUR NEGATIVE 08/03/2009 1550   UROBILINOGEN 0.2 08/03/2009 1550   NITRITE NEGATIVE 08/03/2009 1550   LEUKOCYTESUR NEGATIVE MICROSCOPIC NOT DONE ON URINES WITH NEGATIVE PROTEIN, BLOOD, LEUKOCYTES, NITRITE, OR GLUCOSE <1000 mg/dL. 08/03/2009 1550    RADIOGRAPHIC STUDIES: MM Digital Screening Status: Final result         PACS Images    Show images for MM Digital Screening         Study Result    *RADIOLOGY REPORT*  Clinical Data: Screening.  DIGITAL SCREENING BILATERAL MAMMOGRAM WITH CAD  Comparison: Previous exams.  FINDINGS:  ACR Breast Density Category 3: The breast tissue is heterogeneously  dense.  There are no findings suspicious for malignancy.  Images were processed with CAD.  IMPRESSION:  No mammographic evidence of malignancy.  A result letter of this screening mammogram will be mailed directly  to the patient.  RECOMMENDATION:  Screening mammogram in one year. (Code:SM-B-01Y)  BI-RADS CATEGORY 1: Negative.  Original Report      ASSESSMENT:  #1. Multiple myeloma, status post autologous transplant, no evidence of disease. #2. Hypothyroidism, on treatment. #3. Peripheral neuropathy, minimal, involving toes only, fully functional.    PLAN:  #1. Close followup with lab tests every 2 months. #2. Told to call should she develop bleeding or fever. #3. Followup at [facility] in May of 2015. #4. Followup in this clinic in December of 2015 with labs.   All questions were answered. The   patient knows to call the clinic with any problems, questions or concerns. We can certainly see the patient much sooner if necessary.   I spent 25  minutes counseling the patient face to face. The total time spent in the appointment was 30 minutes.    Formanek, Gregory A, MD 05/22/2013 1:25 PM   

## 2013-06-05 ENCOUNTER — Other Ambulatory Visit (HOSPITAL_COMMUNITY): Payer: Self-pay

## 2013-06-05 DIAGNOSIS — C9000 Multiple myeloma not having achieved remission: Secondary | ICD-10-CM

## 2013-06-09 ENCOUNTER — Encounter (HOSPITAL_COMMUNITY): Payer: 59 | Attending: Hematology and Oncology

## 2013-06-09 DIAGNOSIS — C9000 Multiple myeloma not having achieved remission: Secondary | ICD-10-CM | POA: Insufficient documentation

## 2013-06-09 LAB — COMPREHENSIVE METABOLIC PANEL
ALBUMIN: 4 g/dL (ref 3.5–5.2)
ALT: 16 U/L (ref 0–35)
AST: 20 U/L (ref 0–37)
Alkaline Phosphatase: 60 U/L (ref 39–117)
BUN: 16 mg/dL (ref 6–23)
CALCIUM: 9.8 mg/dL (ref 8.4–10.5)
CO2: 33 mEq/L — ABNORMAL HIGH (ref 19–32)
CREATININE: 1.01 mg/dL (ref 0.50–1.10)
Chloride: 101 mEq/L (ref 96–112)
GFR calc Af Amer: 71 mL/min — ABNORMAL LOW (ref >90)
GFR calc non Af Amer: 61 mL/min — ABNORMAL LOW (ref >90)
Glucose, Bld: 71 mg/dL (ref 70–99)
Potassium: 4.4 mEq/L (ref 3.7–5.3)
Sodium: 141 mEq/L (ref 137–147)
TOTAL PROTEIN: 7.6 g/dL (ref 6.0–8.3)
Total Bilirubin: 0.4 mg/dL (ref 0.3–1.2)

## 2013-06-09 LAB — CBC WITH DIFFERENTIAL/PLATELET
BASOS PCT: 1 % (ref 0–1)
Basophils Absolute: 0 10*3/uL (ref 0.0–0.1)
EOS ABS: 0.1 10*3/uL (ref 0.0–0.7)
EOS PCT: 3 % (ref 0–5)
HEMATOCRIT: 40.8 % (ref 36.0–46.0)
HEMOGLOBIN: 14.4 g/dL (ref 12.0–15.0)
Lymphocytes Relative: 32 % (ref 12–46)
Lymphs Abs: 1.4 10*3/uL (ref 0.7–4.0)
MCH: 34.5 pg — AB (ref 26.0–34.0)
MCHC: 35.3 g/dL (ref 30.0–36.0)
MCV: 97.8 fL (ref 78.0–100.0)
MONO ABS: 0.4 10*3/uL (ref 0.1–1.0)
MONOS PCT: 10 % (ref 3–12)
NEUTROS PCT: 54 % (ref 43–77)
Neutro Abs: 2.3 10*3/uL (ref 1.7–7.7)
Platelets: 209 10*3/uL (ref 150–400)
RBC: 4.17 MIL/uL (ref 3.87–5.11)
RDW: 12.4 % (ref 11.5–15.5)
WBC: 4.2 10*3/uL (ref 4.0–10.5)

## 2013-06-09 NOTE — Progress Notes (Signed)
Labs drawn today for mm panel,kllc,cbc/diff,cmp

## 2013-06-12 LAB — KAPPA/LAMBDA LIGHT CHAINS
KAPPA FREE LGHT CHN: 0.94 mg/dL (ref 0.33–1.94)
Kappa, lambda light chain ratio: 0.66 (ref 0.26–1.65)
Lambda free light chains: 1.42 mg/dL (ref 0.57–2.63)

## 2013-06-14 LAB — MULTIPLE MYELOMA PANEL, SERUM
Albumin ELP: 61.3 % (ref 55.8–66.1)
Alpha-1-Globulin: 3 % (ref 2.9–4.9)
Alpha-2-Globulin: 12.2 % — ABNORMAL HIGH (ref 7.1–11.8)
BETA 2: 4.2 % (ref 3.2–6.5)
Beta Globulin: 5.8 % (ref 4.7–7.2)
GAMMA GLOBULIN: 13.5 % (ref 11.1–18.8)
IgA: 147 mg/dL (ref 69–380)
IgG (Immunoglobin G), Serum: 956 mg/dL (ref 690–1700)
IgM, Serum: 32 mg/dL — ABNORMAL LOW (ref 52–322)
M-Spike, %: NOT DETECTED g/dL
Total Protein: 7 g/dL (ref 6.0–8.3)

## 2013-07-20 ENCOUNTER — Encounter (HOSPITAL_COMMUNITY): Payer: 59 | Attending: Hematology and Oncology

## 2013-07-20 DIAGNOSIS — C9000 Multiple myeloma not having achieved remission: Secondary | ICD-10-CM | POA: Insufficient documentation

## 2013-07-20 LAB — COMPREHENSIVE METABOLIC PANEL
ALK PHOS: 66 U/L (ref 39–117)
ALT: 17 U/L (ref 0–35)
AST: 21 U/L (ref 0–37)
Albumin: 4 g/dL (ref 3.5–5.2)
BUN: 14 mg/dL (ref 6–23)
CHLORIDE: 104 meq/L (ref 96–112)
CO2: 32 meq/L (ref 19–32)
Calcium: 9.7 mg/dL (ref 8.4–10.5)
Creatinine, Ser: 0.95 mg/dL (ref 0.50–1.10)
GFR calc non Af Amer: 66 mL/min — ABNORMAL LOW (ref >90)
GFR, EST AFRICAN AMERICAN: 76 mL/min — AB (ref >90)
GLUCOSE: 73 mg/dL (ref 70–99)
POTASSIUM: 4.5 meq/L (ref 3.7–5.3)
Sodium: 143 mEq/L (ref 137–147)
Total Bilirubin: 0.4 mg/dL (ref 0.3–1.2)
Total Protein: 7.8 g/dL (ref 6.0–8.3)

## 2013-07-20 LAB — CBC WITH DIFFERENTIAL/PLATELET
Basophils Absolute: 0 10*3/uL (ref 0.0–0.1)
Basophils Relative: 1 % (ref 0–1)
Eosinophils Absolute: 0.2 10*3/uL (ref 0.0–0.7)
Eosinophils Relative: 5 % (ref 0–5)
HEMATOCRIT: 40.9 % (ref 36.0–46.0)
Hemoglobin: 13.9 g/dL (ref 12.0–15.0)
LYMPHS ABS: 1 10*3/uL (ref 0.7–4.0)
LYMPHS PCT: 24 % (ref 12–46)
MCH: 33.4 pg (ref 26.0–34.0)
MCHC: 34 g/dL (ref 30.0–36.0)
MCV: 98.3 fL (ref 78.0–100.0)
MONO ABS: 0.5 10*3/uL (ref 0.1–1.0)
Monocytes Relative: 11 % (ref 3–12)
NEUTROS ABS: 2.4 10*3/uL (ref 1.7–7.7)
Neutrophils Relative %: 59 % (ref 43–77)
Platelets: 224 10*3/uL (ref 150–400)
RBC: 4.16 MIL/uL (ref 3.87–5.11)
RDW: 12.3 % (ref 11.5–15.5)
WBC: 4 10*3/uL (ref 4.0–10.5)

## 2013-07-20 NOTE — Progress Notes (Signed)
Labs drawn today for cbc/diff,cmp,kllc,mm

## 2013-07-21 LAB — KAPPA/LAMBDA LIGHT CHAINS
Kappa free light chain: 1.24 mg/dL (ref 0.33–1.94)
Kappa, lambda light chain ratio: 0.73 (ref 0.26–1.65)
Lambda free light chains: 1.71 mg/dL (ref 0.57–2.63)

## 2013-07-24 LAB — MULTIPLE MYELOMA PANEL, SERUM
ALPHA-1-GLOBULIN: 3.6 % (ref 2.9–4.9)
ALPHA-2-GLOBULIN: 11.9 % — AB (ref 7.1–11.8)
Albumin ELP: 61.3 % (ref 55.8–66.1)
BETA 2: 4.1 % (ref 3.2–6.5)
BETA GLOBULIN: 5.9 % (ref 4.7–7.2)
GAMMA GLOBULIN: 13.2 % (ref 11.1–18.8)
IGG (IMMUNOGLOBIN G), SERUM: 972 mg/dL (ref 690–1700)
IgA: 135 mg/dL (ref 69–380)
IgM, Serum: 31 mg/dL — ABNORMAL LOW (ref 52–322)
M-Spike, %: NOT DETECTED g/dL
Total Protein: 7 g/dL (ref 6.0–8.3)

## 2013-09-22 ENCOUNTER — Other Ambulatory Visit (HOSPITAL_COMMUNITY): Payer: 59

## 2013-09-22 ENCOUNTER — Other Ambulatory Visit: Payer: Self-pay | Admitting: Obstetrics and Gynecology

## 2013-09-22 DIAGNOSIS — Z1231 Encounter for screening mammogram for malignant neoplasm of breast: Secondary | ICD-10-CM

## 2013-09-25 ENCOUNTER — Encounter (HOSPITAL_COMMUNITY): Payer: 59 | Attending: Hematology and Oncology

## 2013-09-25 DIAGNOSIS — C9000 Multiple myeloma not having achieved remission: Secondary | ICD-10-CM | POA: Insufficient documentation

## 2013-09-25 LAB — CBC WITH DIFFERENTIAL/PLATELET
Basophils Absolute: 0 10*3/uL (ref 0.0–0.1)
Basophils Relative: 0 % (ref 0–1)
EOS ABS: 0.1 10*3/uL (ref 0.0–0.7)
EOS PCT: 3 % (ref 0–5)
HCT: 38 % (ref 36.0–46.0)
HEMOGLOBIN: 13.3 g/dL (ref 12.0–15.0)
LYMPHS ABS: 1.3 10*3/uL (ref 0.7–4.0)
Lymphocytes Relative: 30 % (ref 12–46)
MCH: 33.5 pg (ref 26.0–34.0)
MCHC: 35 g/dL (ref 30.0–36.0)
MCV: 95.7 fL (ref 78.0–100.0)
MONOS PCT: 7 % (ref 3–12)
Monocytes Absolute: 0.3 10*3/uL (ref 0.1–1.0)
Neutro Abs: 2.7 10*3/uL (ref 1.7–7.7)
Neutrophils Relative %: 60 % (ref 43–77)
Platelets: 230 10*3/uL (ref 150–400)
RBC: 3.97 MIL/uL (ref 3.87–5.11)
RDW: 12.5 % (ref 11.5–15.5)
WBC: 4.5 10*3/uL (ref 4.0–10.5)

## 2013-09-25 LAB — COMPREHENSIVE METABOLIC PANEL
ALT: 15 U/L (ref 0–35)
AST: 17 U/L (ref 0–37)
Albumin: 3.8 g/dL (ref 3.5–5.2)
Alkaline Phosphatase: 60 U/L (ref 39–117)
BUN: 19 mg/dL (ref 6–23)
CALCIUM: 9.4 mg/dL (ref 8.4–10.5)
CO2: 31 mEq/L (ref 19–32)
Chloride: 100 mEq/L (ref 96–112)
Creatinine, Ser: 1.03 mg/dL (ref 0.50–1.10)
GFR calc non Af Amer: 60 mL/min — ABNORMAL LOW (ref >90)
GFR, EST AFRICAN AMERICAN: 69 mL/min — AB (ref >90)
GLUCOSE: 98 mg/dL (ref 70–99)
Potassium: 4 mEq/L (ref 3.7–5.3)
Sodium: 140 mEq/L (ref 137–147)
TOTAL PROTEIN: 7 g/dL (ref 6.0–8.3)
Total Bilirubin: 0.3 mg/dL (ref 0.3–1.2)

## 2013-09-25 NOTE — Progress Notes (Signed)
Labs drawn

## 2013-09-26 LAB — KAPPA/LAMBDA LIGHT CHAINS
Kappa free light chain: 0.66 mg/dL (ref 0.33–1.94)
Kappa, lambda light chain ratio: 0.46 (ref 0.26–1.65)
Lambda free light chains: 1.44 mg/dL (ref 0.57–2.63)

## 2013-09-28 LAB — MULTIPLE MYELOMA PANEL, SERUM
Albumin ELP: 62.1 % (ref 55.8–66.1)
Alpha-1-Globulin: 3.1 % (ref 2.9–4.9)
Alpha-2-Globulin: 11.2 % (ref 7.1–11.8)
Beta 2: 4.8 % (ref 3.2–6.5)
Beta Globulin: 5.4 % (ref 4.7–7.2)
Gamma Globulin: 13.4 % (ref 11.1–18.8)
IgA: 125 mg/dL (ref 69–380)
IgG (Immunoglobin G), Serum: 954 mg/dL (ref 690–1700)
IgM, Serum: 30 mg/dL — ABNORMAL LOW (ref 52–322)
M-Spike, %: NOT DETECTED g/dL
Total Protein: 6.5 g/dL (ref 6.0–8.3)

## 2013-10-05 ENCOUNTER — Ambulatory Visit (HOSPITAL_COMMUNITY): Payer: 59

## 2013-10-17 ENCOUNTER — Ambulatory Visit (INDEPENDENT_AMBULATORY_CARE_PROVIDER_SITE_OTHER): Payer: 59 | Admitting: Obstetrics and Gynecology

## 2013-10-17 ENCOUNTER — Other Ambulatory Visit (HOSPITAL_COMMUNITY)
Admission: RE | Admit: 2013-10-17 | Discharge: 2013-10-17 | Disposition: A | Discharge status: Home / Self Care | Payer: 59 | Source: Ambulatory Visit | Attending: Obstetrics and Gynecology | Admitting: Obstetrics and Gynecology

## 2013-10-17 ENCOUNTER — Encounter: Payer: Self-pay | Admitting: Obstetrics and Gynecology

## 2013-10-17 VITALS — BP 124/76 | Ht 62.0 in | Wt 144.0 lb

## 2013-10-17 DIAGNOSIS — Z01419 Encounter for gynecological examination (general) (routine) without abnormal findings: Secondary | ICD-10-CM | POA: Insufficient documentation

## 2013-10-17 DIAGNOSIS — Z1151 Encounter for screening for human papillomavirus (HPV): Secondary | ICD-10-CM | POA: Insufficient documentation

## 2013-10-17 NOTE — Progress Notes (Signed)
Patient ID: Melissa [name], female   DOB: Aug 14, 1955, 58 y.o.   MRN: [id number]  Assessment:  Annual Gyn Exam   Plan:  1. pap smear done, next pap due in 3 years if normal results today.  2. return annually or prn 3    Annual mammogram advised Subjective:  Melissa [name] is a 58 y.o. female No obstetric history on file. who presents for annual exam. No LMP recorded. Patient is postmenopausal. The patient has no complaints today.  The following portions of the patient's history were reviewed and updated as appropriate: allergies, current medications, past family history, past medical history, past social history, past surgical history and problem list.  Review of Systems Constitutional: negative Gastrointestinal: negative Genitourinary: neg  Objective:  BP 124/76  Ht 5\' 2"  (1.575 m)  Wt 144 lb (65.318 kg)  BMI 26.33 kg/m2   BMI: Body mass index is 26.33 kg/(m^2).  General Appearance: Alert, appropriate appearance for age. No acute distress HEENT: Grossly normal Neck / Thyroid:  Cardiovascular: RRR; normal S1, S2, no murmur Lungs: CTA bilaterally Back: No CVAT Breast Exam: No dimpling, nipple retraction or discharge. No masses or nodes., Normal breast tissue bilaterally and No masses or nodes.No dimpling, nipple retraction or discharge. Gastrointestinal: Soft, non-tender, no masses or organomegaly Pelvic Exam: atropic Rectovaginal: Pt to get 3 hem. Cards and bring back to office. Lymphatic Exam: Non-palpable nodes in neck, clavicular, axillary, or inguinal regions Skin: no rash or abnormalities Neurologic: Normal gait and speech, no tremor  Psychiatric: Alert and oriented, appropriate affect.  Urinalysis:normal and Not done  Mallory Shirk. MD Pgr [phone] 2:18 PM

## 2013-10-18 ENCOUNTER — Encounter: Payer: Self-pay | Admitting: Family Medicine

## 2013-10-18 ENCOUNTER — Ambulatory Visit (INDEPENDENT_AMBULATORY_CARE_PROVIDER_SITE_OTHER): Payer: 59 | Admitting: Family Medicine

## 2013-10-18 VITALS — BP 118/80 | Ht 62.0 in | Wt 144.0 lb

## 2013-10-18 DIAGNOSIS — Z0189 Encounter for other specified special examinations: Secondary | ICD-10-CM

## 2013-10-18 DIAGNOSIS — E039 Hypothyroidism, unspecified: Secondary | ICD-10-CM

## 2013-10-18 MED ORDER — LEVOTHYROXINE SODIUM 75 MCG PO TABS: 75.0000 ug | ORAL_TABLET | Freq: Every day | ORAL | Status: DC

## 2013-10-18 NOTE — Progress Notes (Signed)
   Subjective:    Patient ID: Melissa [name], female    DOB: 1955-07-05, 58 y.o.   MRN: [id number]  HPI Hypothyroidism check up. Had TSH done in January. Needs 90 day refill on levothyroxine.   Patient states no concerns today.   She denies any particular problems energy level doing well she's eating well staying physically active  Review of Systems    denies wheezing tightness nausea vomiting diarrhea Objective:   Physical Exam Lungs are clear hearts regular neck no masses pulse normal       Assessment & Plan:  Cholesterol screening recommended  Patient will do thyroid testing in January  Refills given followup in one year. Patient doing well. She gets her GYN checkups through the gynecologist and gets her cancer surveillance through oncology

## 2013-10-19 LAB — CYTOLOGY - PAP

## 2013-11-14 ENCOUNTER — Other Ambulatory Visit (HOSPITAL_COMMUNITY): Payer: Self-pay | Admitting: Oncology

## 2013-11-16 ENCOUNTER — Encounter (HOSPITAL_COMMUNITY): Payer: 59 | Attending: Hematology and Oncology

## 2013-11-16 DIAGNOSIS — C9000 Multiple myeloma not having achieved remission: Secondary | ICD-10-CM | POA: Diagnosis present

## 2013-11-16 LAB — COMPREHENSIVE METABOLIC PANEL
ALK PHOS: 57 U/L (ref 39–117)
ALT: 15 U/L (ref 0–35)
ANION GAP: 8 (ref 5–15)
AST: 18 U/L (ref 0–37)
Albumin: 4 g/dL (ref 3.5–5.2)
BILIRUBIN TOTAL: 0.4 mg/dL (ref 0.3–1.2)
BUN: 15 mg/dL (ref 6–23)
CO2: 32 mEq/L (ref 19–32)
Calcium: 9.5 mg/dL (ref 8.4–10.5)
Chloride: 102 mEq/L (ref 96–112)
Creatinine, Ser: 1.01 mg/dL (ref 0.50–1.10)
GFR calc non Af Amer: 61 mL/min — ABNORMAL LOW (ref >90)
GFR, EST AFRICAN AMERICAN: 71 mL/min — AB (ref >90)
GLUCOSE: 59 mg/dL — AB (ref 70–99)
POTASSIUM: 3.9 meq/L (ref 3.7–5.3)
Sodium: 142 mEq/L (ref 137–147)
Total Protein: 7.2 g/dL (ref 6.0–8.3)

## 2013-11-16 LAB — CBC WITH DIFFERENTIAL/PLATELET
Basophils Absolute: 0 10*3/uL (ref 0.0–0.1)
Basophils Relative: 1 % (ref 0–1)
Eosinophils Absolute: 0.2 10*3/uL (ref 0.0–0.7)
Eosinophils Relative: 5 % (ref 0–5)
HCT: 38.9 % (ref 36.0–46.0)
HEMOGLOBIN: 13.8 g/dL (ref 12.0–15.0)
LYMPHS ABS: 1.2 10*3/uL (ref 0.7–4.0)
Lymphocytes Relative: 29 % (ref 12–46)
MCH: 34 pg (ref 26.0–34.0)
MCHC: 35.5 g/dL (ref 30.0–36.0)
MCV: 95.8 fL (ref 78.0–100.0)
MONOS PCT: 9 % (ref 3–12)
Monocytes Absolute: 0.4 10*3/uL (ref 0.1–1.0)
NEUTROS ABS: 2.2 10*3/uL (ref 1.7–7.7)
Neutrophils Relative %: 56 % (ref 43–77)
Platelets: 216 10*3/uL (ref 150–400)
RBC: 4.06 MIL/uL (ref 3.87–5.11)
RDW: 12.5 % (ref 11.5–15.5)
WBC: 4 10*3/uL (ref 4.0–10.5)

## 2013-11-16 NOTE — Progress Notes (Signed)
Labs  For kllc,mm,cmp,cbcd

## 2013-11-17 LAB — KAPPA/LAMBDA LIGHT CHAINS
Kappa free light chain: 0.82 mg/dL (ref 0.33–1.94)
Kappa, lambda light chain ratio: 0.65 (ref 0.26–1.65)
Lambda free light chains: 1.26 mg/dL (ref 0.57–2.63)

## 2013-11-20 LAB — MULTIPLE MYELOMA PANEL, SERUM
Albumin ELP: 61.8 % (ref 55.8–66.1)
Alpha-1-Globulin: 3.3 % (ref 2.9–4.9)
Alpha-2-Globulin: 11.6 % (ref 7.1–11.8)
Beta 2: 4.1 % (ref 3.2–6.5)
Beta Globulin: 6 % (ref 4.7–7.2)
Gamma Globulin: 13.2 % (ref 11.1–18.8)
IGA: 133 mg/dL (ref 69–380)
IgG (Immunoglobin G), Serum: 1030 mg/dL (ref 690–1700)
IgM, Serum: 34 mg/dL — ABNORMAL LOW (ref 52–322)
M-Spike, %: NOT DETECTED g/dL
TOTAL PROTEIN: 6.9 g/dL (ref 6.0–8.3)

## 2013-11-22 ENCOUNTER — Other Ambulatory Visit (HOSPITAL_COMMUNITY): Payer: 59

## 2014-02-07 ENCOUNTER — Telehealth (HOSPITAL_COMMUNITY): Payer: Self-pay | Admitting: Oncology

## 2014-02-07 ENCOUNTER — Other Ambulatory Visit (HOSPITAL_COMMUNITY): Payer: Self-pay | Admitting: Oncology

## 2014-02-07 NOTE — Telephone Encounter (Signed)
Message left for patient that standing orders are available for use.

## 2014-02-07 NOTE — Telephone Encounter (Signed)
Standing orders are available for use.

## 2014-02-13 ENCOUNTER — Other Ambulatory Visit (HOSPITAL_COMMUNITY): Payer: Self-pay | Admitting: Endocrinology

## 2014-02-13 DIAGNOSIS — C9000 Multiple myeloma not having achieved remission: Secondary | ICD-10-CM

## 2014-02-14 LAB — CBC WITH DIFFERENTIAL/PLATELET
Basophils Absolute: 0 10*3/uL (ref 0.0–0.1)
Basophils Relative: 0 % (ref 0–1)
EOS PCT: 2 % (ref 0–5)
Eosinophils Absolute: 0.1 10*3/uL (ref 0.0–0.7)
HEMATOCRIT: 38.6 % (ref 36.0–46.0)
Hemoglobin: 13.6 g/dL (ref 12.0–15.0)
LYMPHS ABS: 1.3 10*3/uL (ref 0.7–4.0)
LYMPHS PCT: 35 % (ref 12–46)
MCH: 33.7 pg (ref 26.0–34.0)
MCHC: 35.2 g/dL (ref 30.0–36.0)
MCV: 95.8 fL (ref 78.0–100.0)
Monocytes Absolute: 0.3 10*3/uL (ref 0.1–1.0)
Monocytes Relative: 9 % (ref 3–12)
Neutro Abs: 2 10*3/uL (ref 1.7–7.7)
Neutrophils Relative %: 54 % (ref 43–77)
PLATELETS: 213 10*3/uL (ref 150–400)
RBC: 4.03 MIL/uL (ref 3.87–5.11)
RDW: 12.6 % (ref 11.5–15.5)
WBC: 3.7 10*3/uL — AB (ref 4.0–10.5)

## 2014-02-14 LAB — COMPREHENSIVE METABOLIC PANEL
ALK PHOS: 61 U/L (ref 39–117)
ALT: 16 U/L (ref 0–35)
AST: 21 U/L (ref 0–37)
Albumin: 4 g/dL (ref 3.5–5.2)
Anion gap: 9 (ref 5–15)
BUN: 17 mg/dL (ref 6–23)
CALCIUM: 9.4 mg/dL (ref 8.4–10.5)
CO2: 30 mEq/L (ref 19–32)
Chloride: 102 mEq/L (ref 96–112)
Creatinine, Ser: 0.98 mg/dL (ref 0.50–1.10)
GFR calc Af Amer: 73 mL/min — ABNORMAL LOW (ref >90)
GFR, EST NON AFRICAN AMERICAN: 63 mL/min — AB (ref >90)
GLUCOSE: 61 mg/dL — AB (ref 70–99)
Potassium: 4.1 mEq/L (ref 3.7–5.3)
SODIUM: 141 meq/L (ref 137–147)
Total Bilirubin: 0.3 mg/dL (ref 0.3–1.2)
Total Protein: 7.7 g/dL (ref 6.0–8.3)

## 2014-02-15 LAB — KAPPA/LAMBDA LIGHT CHAINS
KAPPA FREE LGHT CHN: 1.29 mg/dL (ref 0.33–1.94)
Kappa, lambda light chain ratio: 0.67 (ref 0.26–1.65)
Lambda free light chains: 1.93 mg/dL (ref 0.57–2.63)

## 2014-02-16 LAB — MULTIPLE MYELOMA PANEL, SERUM
Albumin ELP: 60.4 % (ref 55.8–66.1)
Alpha-1-Globulin: 3.1 % (ref 2.9–4.9)
Alpha-2-Globulin: 11.5 % (ref 7.1–11.8)
Beta 2: 4.9 % (ref 3.2–6.5)
Beta Globulin: 5.5 % (ref 4.7–7.2)
GAMMA GLOBULIN: 14.6 % (ref 11.1–18.8)
IGG (IMMUNOGLOBIN G), SERUM: 951 mg/dL (ref 690–1700)
IgA: 133 mg/dL (ref 69–380)
IgM, Serum: 59 mg/dL — ABNORMAL LOW (ref 52–322)
M-Spike, %: NOT DETECTED g/dL
Total Protein: 7.1 g/dL (ref 6.0–8.3)

## 2014-03-27 ENCOUNTER — Ambulatory Visit (HOSPITAL_COMMUNITY): Payer: 59

## 2014-03-30 ENCOUNTER — Encounter (HOSPITAL_COMMUNITY): Payer: Self-pay

## 2014-03-30 ENCOUNTER — Encounter (HOSPITAL_BASED_OUTPATIENT_CLINIC_OR_DEPARTMENT_OTHER): Payer: 59

## 2014-03-30 ENCOUNTER — Encounter (HOSPITAL_COMMUNITY): Payer: 59 | Attending: Hematology and Oncology

## 2014-03-30 VITALS — BP 120/63 | HR 105 | Temp 98.5°F | Resp 16 | Wt 147.7 lb

## 2014-03-30 DIAGNOSIS — C9000 Multiple myeloma not having achieved remission: Secondary | ICD-10-CM

## 2014-03-30 DIAGNOSIS — G629 Polyneuropathy, unspecified: Secondary | ICD-10-CM | POA: Diagnosis not present

## 2014-03-30 DIAGNOSIS — E039 Hypothyroidism, unspecified: Secondary | ICD-10-CM | POA: Insufficient documentation

## 2014-03-30 DIAGNOSIS — E038 Other specified hypothyroidism: Secondary | ICD-10-CM

## 2014-03-30 LAB — COMPREHENSIVE METABOLIC PANEL
ALT: 17 U/L (ref 0–35)
AST: 20 U/L (ref 0–37)
Albumin: 4.3 g/dL (ref 3.5–5.2)
Alkaline Phosphatase: 62 U/L (ref 39–117)
Anion gap: 10 (ref 5–15)
BUN: 15 mg/dL (ref 6–23)
CALCIUM: 9.7 mg/dL (ref 8.4–10.5)
CO2: 29 meq/L (ref 19–32)
CREATININE: 0.88 mg/dL (ref 0.50–1.10)
Chloride: 99 mEq/L (ref 96–112)
GFR calc non Af Amer: 72 mL/min — ABNORMAL LOW (ref >90)
GFR, EST AFRICAN AMERICAN: 84 mL/min — AB (ref >90)
GLUCOSE: 127 mg/dL — AB (ref 70–99)
Potassium: 4 mEq/L (ref 3.7–5.3)
Sodium: 138 mEq/L (ref 137–147)
TOTAL PROTEIN: 7.6 g/dL (ref 6.0–8.3)
Total Bilirubin: 0.4 mg/dL (ref 0.3–1.2)

## 2014-03-30 LAB — CBC WITH DIFFERENTIAL/PLATELET
BASOS ABS: 0 10*3/uL (ref 0.0–0.1)
Basophils Relative: 0 % (ref 0–1)
EOS ABS: 0.1 10*3/uL (ref 0.0–0.7)
EOS PCT: 2 % (ref 0–5)
HEMATOCRIT: 40.3 % (ref 36.0–46.0)
Hemoglobin: 14.1 g/dL (ref 12.0–15.0)
LYMPHS PCT: 36 % (ref 12–46)
Lymphs Abs: 1.4 10*3/uL (ref 0.7–4.0)
MCH: 33.7 pg (ref 26.0–34.0)
MCHC: 35 g/dL (ref 30.0–36.0)
MCV: 96.2 fL (ref 78.0–100.0)
MONO ABS: 0.3 10*3/uL (ref 0.1–1.0)
Monocytes Relative: 8 % (ref 3–12)
Neutro Abs: 2.2 10*3/uL (ref 1.7–7.7)
Neutrophils Relative %: 54 % (ref 43–77)
PLATELETS: 226 10*3/uL (ref 150–400)
RBC: 4.19 MIL/uL (ref 3.87–5.11)
RDW: 12.5 % (ref 11.5–15.5)
WBC: 4 10*3/uL (ref 4.0–10.5)

## 2014-03-30 NOTE — Progress Notes (Signed)
[place]  OFFICE PROGRESS Melissa Jacobson, MD [place] [address]  DIAGNOSIS: Multiple myeloma  Other specified hypothyroidism  Chief Complaint  Patient presents with  . Multiple Myeloma    CURRENT THERAPY: Watchful expectation after discontinuing Revlimid maintenance in October 2014  INTERVAL HISTORY: Melissa [name] 58 y.o. female returns for follow-up of multiple myeloma, currently on no treatment after taking Revlimid maintenance for 3.5 years ending in October 2014. Per patient preference and with the acknowledgment of the group at [facility], the patient stopped Revlimid in November of 2014.  She was seen at [facility] on 03/07/2014 and received the clinical health. She continues to do well with good appetite and she has minimal peripheral paresthesias involving her toes. She denies any PND, orthopnea, palpitations, sore throat, diarrhea, constipation, melena, hematochezia, hematuria, urinary hesitancy or incontinence, skin rash, headache, or seizures.  MEDICAL HISTORY: Past Medical History  Diagnosis Date  . Multiple myeloma   . Thyroid disease   . Peripheral neuropathy   . Hypothyroidism   . Hypertension 03/2013    stage 1    INTERIM HISTORY: has Multiple myeloma and Unspecified hypothyroidism on her problem list.  [facility] note 09/06/2013 Dr. Marcell Anger : Melissa [name] presented in May of 2010 with right-sided back pain that was worse at night. This progressed to the point where she sought medical attention in her local Emergency Department with x-rays at that time suggesting that she had gallbladder sludge. The pain progressively worsened and was associated with pleuritic pain.   An MRI was eventually ordered on 10/05/2008 which showed T6 fracture and she went on to receive radiation therapy from T1-T7 after diagnosis of multiple myeloma was made.   She was first seen at [facility] in the [place] on 11/12/08 for consultation regarding possible role of autologous stem cell transplantation and the overall management of her disease.   She was subsequently placed on Velcade on days 1, 4, 8 and 11 and went on to receive 4 cycles of therapy. She had an excellent response to Velcade therapy and was evaluated for stem cell transplant and found to be a good candidate.   AUTOLOGOUS STEM CELL TRANSPLANT  Mobilizing Chemotherapy: Cyclophosphamide 1.5 gm/2 every 3 hours x 3 doses for a total of 4.5 gm/m2  Preparative Regimen: Melphalan 200 mg/m2  Day 0 =03/06/2009   G-CSF was begun on day +5. On 03/17/2009, her white count had increased to 5200.   Post-transplant, her hospital course was complicated by significant mucositis requiring PCA for pain control. She did have fevers and neutropenia and was placed on broad-spectrum antibiotics. All cultures were negative; however.   As her counts recovered, her temperatures defervesced and her antibiotics were discontinued.   At her request, her central venous access catheter was removed prior to discharge.   On the day of discharge, her total white count was 3900 with a hemoglobin of 9.9 and platelet count of 58099. Serum electrolytes were within normal limits and her creatinine had increased slightly with liver function studies were within normal limits.   Day +60 evaluation on 05/08/2009 showed no evidence of disease  Melissa [name] was started on post-transplant Revlimid.   Serial Evaluations over time: Date SPEP gm/dl IFIX IgG mg/dl IgA mg/dl IgM mg/dl Kappa mg/L Lambda mg/L  07/20/2013 Negative IgG, Kappa** 972 135 31  06/09/2013 Negative IgG, Kappa** 956 147  32  05/11/2013 Negative IgG, Kappa** 883 131 26  03/30/2013 Negative IgG, Kappa** 956 128 24  02/17/2013 Negative Negative 1010 144 22 13.1 17.8  12/22/2012 Negative IgG, Kappa 860 136 20  10/05/2012 Negative IgG, Kappa 1060 122 21 8.8  14.3  08/15/2012 Negative IgG, Kappa 1150 138 26  02/08/2012 Negative IgG, Kappa 1010 113 24  09/16/2011 0.33 IgG, Kappa 1130 111 25  03/09/2011 0.35 IgG, Kappa x 2 1136 104 27 25.4 15.1  09/03/2010 Negative IgG, Kappa x 2 897 78 < 25 16.0 10.9  03/07/2010 Negative Polyclonal 950 69 49 15.0 11.2  09/11/2009 Negative IgG*, Kappa x 2 800 56 < 25 9.4 5.7  *Immunoglobulin class switch **Possible area of slightly restricted mobility in the IgG and Kappa Lanes   She has been followed on a regular basis with out evidence of disease progression.   She elected to stop taking zometa after her dose in August 2014 and subsequently she discontinued revlimid in October 2014 after 3.5+ years of maintenance  -------------------------------------------------------------------------------------------------------------------------------------- IMPRESSION AND PLAN: Dr. Marcell Anger 03/07/2014   IgA kappa multiple myeloma. Currently 5 years post autologous transplant.  She is off revlimid maintenance for 13 months. Off zometa 15 months..   She will continued to be monitored off therapy at this time.  Would recommended  Repeat values every 3 months x 2 then every 4 months x 6 then twice yearly x 2 then yearly   MRI was ordered on 10/05/2008 which showed T6 fracture and she went on to receive radiation therapy from T1-T7 after diagnosis of multiple myeloma was made. She was treated with Velcade on days 1, 4, 8 and 11 and went on to receive 4 cycles of therapy. She was believed to have had excellent response to Velcade therapy and was evaluated for stem cell transplant .   She was given melphalan 269m/m2 and 03/04/09 and had stem service Inf[facility] 03/06/2009.  Six month post transplant evaluation (09/03/2010) showed no evidence of recurrent diseaseTwo faint oligoclonal bands: 0.15 gm/dl and 0.11 gm/dl; IFIX IgG Kappa x 2; IgG 899 IgA 78 IgM < 25 Kappa 16 Lambda 10.9. 1 year post transplant evaluation (03/04/2011) showed no  evidence of recurrent disease.SPEP - 0.35 gm/dl IFIX - IgG Kappa x 2; IgG 1186 IgA 104 IgM 27 Kappa 25.4 Lambda 15.1 She has been followed on a regular basis with out evidence of disease progression.  She took 5 mg of Revlimid 7 days on 7 days off which was stopped by mutual consent in November of 2014. She no longer takes Zometa since there is no evidence of myeloma.  ALLERGIES:  is allergic to hydromorphone hcl; poison ivy extract; and tape.  MEDICATIONS: has a current medication list which includes the following prescription(s): calcium carbonate-vitamin d, vitamin d, ibuprofen, and levothyroxine, and the following Facility-Administered Medications: sodium chloride, sodium chloride, and sodium chloride.  SURGICAL HISTORY:  Past Surgical History  Procedure Laterality Date  . Tubal ligation    . Tonsilectomy, adenoidectomy, bilateral myringotomy and tubes    . Cesarean section  06/1993  . Cataract extraction w/phaco Right 04/04/2013    Procedure: CATARACT EXTRACTION PHACO AND INTRAOCULAR LENS PLACEMENT (IOC);  Surgeon: MElta GuadeloupeT. SGershon Crane MD;  Location: [facility];  Service: Ophthalmology;  Laterality: Right;  CDE:3.42  . Cataract extraction w/phaco Left 04/18/2013    Procedure: CATARACT EXTRACTION PHACO AND INTRAOCULAR LENS PLACEMENT (IOC);  Surgeon: MElta GuadeloupeT. SGershon Crane MD;  Location: [facility];  Service: Ophthalmology;  Laterality: Left;  CDE:4.91  FAMILY HISTORY: family history includes Cancer in her brother; Diabetes in her mother; Factor V Leiden deficiency in her daughter; Heart attack in her father and mother; Heart disease in her maternal grandmother; Hypertension in her brother, brother, father, mother, and sister; Skin cancer in her brother, brother, father, mother, and sister; Stroke in her maternal grandfather.  SOCIAL HISTORY:  reports that she has never smoked. She has never used smokeless tobacco. She reports that she drinks alcohol. She reports that she does not use illicit  drugs.  REVIEW OF SYSTEMS:  Other than that discussed above is noncontributory.  PHYSICAL EXAMINATION: ECOG PERFORMANCE STATUS: 1 - Symptomatic but completely ambulatory  Blood pressure 120/63, pulse 105, temperature 98.5 F (36.9 C), temperature source Oral, resp. rate 16, weight 147 lb 11.2 oz (66.996 kg), SpO2 100 %.  GENERAL:alert, no distress and comfortable SKIN: skin color, texture, turgor are normal, no rashes or significant lesions EYES: PERLA; Conjunctiva are pink and non-injected, sclera clear SINUSES: No redness or tenderness over maxillary or ethmoid sinuses OROPHARYNX:no exudate, no erythema on lips, buccal mucosa, or tongue. NECK: supple, thyroid normal size, non-tender, without nodularity. No masses CHEST: Normal AP diameter with no breast masses. LYMPH:  no palpable lymphadenopathy in the cervical, axillary or inguinal LUNGS: clear to auscultation and percussion with normal breathing effort HEART: regular rate & rhythm and no murmurs. ABDOMEN:abdomen soft, non-tender and normal bowel sounds MUSCULOSKELETAL:no cyanosis of digits and no clubbing. Range of motion normal.  NEURO: alert & oriented x 3 with fluent speech, no focal motor/sensory deficits. Normal DTRs.   LABORATORY DATA: Lab on 03/30/2014  Component Date Value Ref Range Status  . Total Protein 03/30/2014 PENDING  6.0 - 8.3 g/dL Incomplete  . Albumin ELP 03/30/2014 PENDING  55.8 - 66.1 % Incomplete  . Alpha-1-Globulin 03/30/2014 PENDING  2.9 - 4.9 % Incomplete  . Alpha-2-Globulin 03/30/2014 PENDING  7.1 - 11.8 % Incomplete  . Beta Globulin 03/30/2014 PENDING  4.7 - 7.2 % Incomplete  . Beta 2 03/30/2014 PENDING  3.2 - 6.5 % Incomplete  . Gamma Globulin 03/30/2014 PENDING  11.1 - 18.8 % Incomplete  . M-Spike, % 03/30/2014 PENDING   Incomplete  . SPE Interp. 03/30/2014 PENDING   Incomplete  . Comment 03/30/2014 PENDING   Incomplete  . IgG (Immunoglobin G), Serum 03/30/2014 PENDING  694 - 1618 mg/dL  Incomplete  . IgA 03/30/2014 PENDING  68 - 378 mg/dL Incomplete  . IgM, Serum 03/30/2014 PENDING  60 - 263 mg/dL Incomplete  . Immunofix Electr Int 03/30/2014 PENDING   Incomplete  . WBC 03/30/2014 4.0  4.0 - 10.5 K/uL Final  . RBC 03/30/2014 4.19  3.87 - 5.11 MIL/uL Final  . Hemoglobin 03/30/2014 14.1  12.0 - 15.0 g/dL Final  . HCT 03/30/2014 40.3  36.0 - 46.0 % Final  . MCV 03/30/2014 96.2  78.0 - 100.0 fL Final  . MCH 03/30/2014 33.7  26.0 - 34.0 pg Final  . MCHC 03/30/2014 35.0  30.0 - 36.0 g/dL Final  . RDW 03/30/2014 12.5  11.5 - 15.5 % Final  . Platelets 03/30/2014 226  150 - 400 K/uL Final  . Neutrophils Relative % 03/30/2014 54  43 - 77 % Final  . Neutro Abs 03/30/2014 2.2  1.7 - 7.7 K/uL Final  . Lymphocytes Relative 03/30/2014 36  12 - 46 % Final  . Lymphs Abs 03/30/2014 1.4  0.7 - 4.0 K/uL Final  . Monocytes Relative 03/30/2014 8  3 - 12 % Final  .  Monocytes Absolute 03/30/2014 0.3  0.1 - 1.0 K/uL Final  . Eosinophils Relative 03/30/2014 2  0 - 5 % Final  . Eosinophils Absolute 03/30/2014 0.1  0.0 - 0.7 K/uL Final  . Basophils Relative 03/30/2014 0  0 - 1 % Final  . Basophils Absolute 03/30/2014 0.0  0.0 - 0.1 K/uL Final  . Sodium 03/30/2014 138  137 - 147 mEq/L Final  . Potassium 03/30/2014 4.0  3.7 - 5.3 mEq/L Final  . Chloride 03/30/2014 99  96 - 112 mEq/L Final  . CO2 03/30/2014 29  19 - 32 mEq/L Final  . Glucose, Bld 03/30/2014 127* 70 - 99 mg/dL Final  . BUN 03/30/2014 15  6 - 23 mg/dL Final  . Creatinine, Ser 03/30/2014 0.88  0.50 - 1.10 mg/dL Final  . Calcium 03/30/2014 9.7  8.4 - 10.5 mg/dL Final  . Total Protein 03/30/2014 7.6  6.0 - 8.3 g/dL Final  . Albumin 03/30/2014 4.3  3.5 - 5.2 g/dL Final  . AST 03/30/2014 20  0 - 37 U/L Final  . ALT 03/30/2014 17  0 - 35 U/L Final  . Alkaline Phosphatase 03/30/2014 62  39 - 117 U/L Final  . Total Bilirubin 03/30/2014 0.4  0.3 - 1.2 mg/dL Final  . GFR calc non Af Amer 03/30/2014 72* >90 mL/min Final  . GFR calc Af  Amer 03/30/2014 84* >90 mL/min Final   Comment: (NOTE) The eGFR has been calculated using the CKD EPI equation. This calculation has not been validated in all clinical situations. eGFR's persistently <90 mL/min signify possible Chronic Kidney Disease.   . Anion gap 03/30/2014 10  5 - 15 Final    PATHOLOGY: . FINAL for [place], Melissa [name] ([id number]) Patient: Melissa [name], Melissa [name] Collected: 10/17/2013 Client: [facility] Accession: [id number] Received: 10/18/2013 Mallory Shirk, MD DOB: May 30, 1955 Age: 28 Gender: F Reported: 10/19/2013 [address] Patient Ph: [phone] MRN#: [id number] Linna Hoff [place] [address] Client Acc#: Chart: Phone: [phone] Fax: LMP: Visit#: [id number] CC: GYNECOLOGIC CYTOLOGY REPORT Adequacy Reason Satisfactory for evaluation, endocervical/transformation zone component PRESENT. Diagnosis NEGATIVE FOR INTRAEPITHELIAL LESIONS OR MALIGNANCY. JAMIE BRADY Cytotechnologist Electronic Signature (Case signed 10/19/2013) Source CervicoVaginal Pap [ThinPrep Imaged] Ancillary Testing HPV High Risk High Risk HPV: NOT DETECTED Completed by GB on 2013-10-19 Disclaimer APTIMA HPV assay on the Panther DTS system is an in vitro nucleic acid amplification test for the qualitative detection of E6/E7 viral messenger RNA of High-Risk HPV types 16, 18, 31, 33, 35, 39, 45, 51, 52, 56, 58, 59, 66, & 68. This test was validated and its appropriate performance characteristics determined by the reporting laboratory. This laboratory is certified under the HUTM-54 as qualified to perform high complexity clinical laboratory testing. Note: The PAP test is a screening test, not a diagnostic procedure and should not be used as the sole means of detecting cervical cancer. The pap technique in the best of hands is subject to both false negative and false positive result at low but significant rates as evidenced by published data. This result should be interpreted in  the context of this data, plus your patient's history and clinical findings 1 of 2 FINAL for [name], Melissa [name] [phone]) Report signed out from the following location(s) Technical Component and Interpretation performed at [facility] [place], Urinalysis    Component Value Date/Time   COLORURINE YELLOW 08/03/2009 [place] 08/03/2009 1550   LABSPEC 1.010 08/03/2009 1550   PHURINE 6.5 08/03/2009 1550  GLUCOSEU NEGATIVE 08/03/2009 1550   HGBUR NEGATIVE 08/03/2009 [place] 08/03/2009 [place] 08/03/2009 1550   PROTEINUR NEGATIVE 08/03/2009 1550   UROBILINOGEN 0.2 08/03/2009 1550   NITRITE NEGATIVE 08/03/2009 1550   LEUKOCYTESUR  08/03/2009 1550    NEGATIVE MICROSCOPIC NOT DONE ON URINES WITH NEGATIVE PROTEIN, BLOOD, LEUKOCYTES, NITRITE, OR GLUCOSE <1000 mg/dL.    RADIOGRAPHIC STUDIES: No results found.  ASSESSMENT:  #1. Multiple myeloma, status post autologous transplant, no evidence of disease. #2. Hypothyroidism, on treatment. #3. Peripheral neuropathy, minimal, involving toes only, fully functional.  PLAN:  #1. Lab tests every 3 months. #2. Follow-up at [facility] in May 2016. #3. Follow-up here in one year.   All questions were answered. The patient knows to call the clinic with any problems, questions or concerns. We can certainly see the patient much sooner if necessary.   I spent 25 minutes counseling the patient face to face. The total time spent in the appointment was 30 minutes.    Doroteo Bradford, MD 03/30/2014 1:47 PM  DISCLAIMER:  This note was dictated with voice recognition software.  Similar sounding words can inadvertently be transcribed inaccurately and may not be corrected upon review.

## 2014-03-30 NOTE — Patient Instructions (Signed)
.  [facility] Discharge Instructions  RECOMMENDATIONS MADE BY THE CONSULTANT AND ANY TEST RESULTS WILL BE SENT TO YOUR REFERRING PHYSICIAN.  EXAM FINDINGS BY THE PHYSICIAN TODAY AND SIGNS OR SYMPTOMS TO REPORT TO CLINIC OR PRIMARY PHYSICIAN: Exam and findings as discussed by Dr. Barnet Glasgow.  Will check labs every 3 months.  Report unexplained weight loss, pain or other concerns.  INSTRUCTIONS/FOLLOW-UP: Labs every 3 months and office visit in 1 year.  Thank you for choosing [facility] to provide your oncology and hematology care.  To afford each patient quality time with our providers, please arrive at least 15 minutes before your scheduled appointment time.  With your help, our goal is to use those 15 minutes to complete the necessary work-up to ensure our physicians have the information they need to help with your evaluation and healthcare recommendations.    Effective January 1st, 2014, we ask that you re-schedule your appointment with our physicians should you arrive 10 or more minutes late for your appointment.  We strive to give you quality time with our providers, and arriving late affects you and other patients whose appointments are after yours.    Again, thank you for choosing [facility].  Our hope is that these requests will decrease the amount of time that you wait before being seen by our physicians.       _____________________________________________________________  Should you have questions after your visit to [facility], please contact our office at [phone] between the hours of 8:30 a.m. and 4:30 p.m.  Voicemails left after 4:30 p.m. will not be returned until the following business day.  For prescription refill requests, have your pharmacy contact our office with your prescription refill request.    _______________________________________________________________  We hope that we have given you very good care.   You may receive a patient satisfaction survey in the mail, please complete it and return it as soon as possible.  We value your feedback!  _______________________________________________________________  Have you asked about our STAR program?  STAR stands for Survivorship Training and Rehabilitation, and this is a nationally recognized cancer care program that focuses on survivorship and rehabilitation.  Cancer and cancer treatments may cause problems, such as, pain, making you feel tired and keeping you from doing the things that you need or want to do. Cancer rehabilitation can help. Our goal is to reduce these troubling effects and help you have the best quality of life possible.  You may receive a survey from a nurse that asks questions about your current state of health.  Based on the survey results, all eligible patients will be referred to the [facility] program for an evaluation so we can better serve you!  A frequently asked questions sheet is available upon request.

## 2014-03-30 NOTE — Progress Notes (Signed)
Labs for mm,cbcd,kllc,cmp

## 2014-04-02 LAB — KAPPA/LAMBDA LIGHT CHAINS
KAPPA FREE LGHT CHN: 1.13 mg/dL (ref 0.33–1.94)
Kappa, lambda light chain ratio: 0.83 (ref 0.26–1.65)
Lambda free light chains: 1.36 mg/dL (ref 0.57–2.63)

## 2014-04-03 LAB — MULTIPLE MYELOMA PANEL, SERUM
ALBUMIN ELP: 59 % (ref 55.8–66.1)
ALPHA-1-GLOBULIN: 3.1 % (ref 2.9–4.9)
ALPHA-2-GLOBULIN: 12.1 % — AB (ref 7.1–11.8)
Beta 2: 5.1 % (ref 3.2–6.5)
Beta Globulin: 5.7 % (ref 4.7–7.2)
Gamma Globulin: 15 % (ref 11.1–18.8)
IGA: 155 mg/dL (ref 69–380)
IGG (IMMUNOGLOBIN G), SERUM: 1130 mg/dL (ref 690–1700)
IGM, SERUM: 50 mg/dL — AB (ref 52–322)
M-Spike, %: NOT DETECTED g/dL
TOTAL PROTEIN: 7.2 g/dL (ref 6.0–8.3)

## 2014-05-03 ENCOUNTER — Other Ambulatory Visit: Payer: Self-pay | Admitting: Family Medicine

## 2014-05-03 MED ORDER — TRIAMCINOLONE ACETONIDE 0.1 % EX CREA
TOPICAL_CREAM | Freq: Two times a day (BID) | CUTANEOUS | Status: DC
Start: 2014-05-03 — End: 2016-07-30

## 2014-05-03 NOTE — Telephone Encounter (Signed)
Left VM stating the medication was sent in.

## 2014-05-03 NOTE — Telephone Encounter (Signed)
30 g may apply bid

## 2014-05-03 NOTE — Telephone Encounter (Signed)
Patient need refill on triamcilone cream for poison ivy called [facility].

## 2014-05-03 NOTE — Addendum Note (Signed)
Addended byCharolotte Capuchin D on: 05/03/2014 01:04 PM   Modules accepted: Orders

## 2014-06-27 ENCOUNTER — Other Ambulatory Visit (HOSPITAL_COMMUNITY): Payer: Self-pay | Admitting: Neurosurgery

## 2014-06-27 DIAGNOSIS — M542 Cervicalgia: Secondary | ICD-10-CM

## 2014-06-28 ENCOUNTER — Other Ambulatory Visit (HOSPITAL_COMMUNITY): Payer: Self-pay

## 2014-06-28 DIAGNOSIS — C9000 Multiple myeloma not having achieved remission: Secondary | ICD-10-CM

## 2014-06-29 ENCOUNTER — Encounter (HOSPITAL_COMMUNITY): Payer: 59 | Attending: Hematology and Oncology

## 2014-06-29 DIAGNOSIS — E039 Hypothyroidism, unspecified: Secondary | ICD-10-CM | POA: Diagnosis not present

## 2014-06-29 DIAGNOSIS — C9000 Multiple myeloma not having achieved remission: Secondary | ICD-10-CM | POA: Diagnosis not present

## 2014-06-29 DIAGNOSIS — G629 Polyneuropathy, unspecified: Secondary | ICD-10-CM | POA: Insufficient documentation

## 2014-06-29 LAB — COMPREHENSIVE METABOLIC PANEL
ALBUMIN: 3.9 g/dL (ref 3.5–5.2)
ALK PHOS: 46 U/L (ref 39–117)
ALT: 37 U/L — AB (ref 0–35)
AST: 31 U/L (ref 0–37)
Anion gap: 5 (ref 5–15)
BUN: 21 mg/dL (ref 6–23)
CHLORIDE: 105 mmol/L (ref 96–112)
CO2: 29 mmol/L (ref 19–32)
Calcium: 9 mg/dL (ref 8.4–10.5)
Creatinine, Ser: 1.04 mg/dL (ref 0.50–1.10)
GFR calc Af Amer: 68 mL/min — ABNORMAL LOW (ref >90)
GFR calc non Af Amer: 58 mL/min — ABNORMAL LOW (ref >90)
GLUCOSE: 86 mg/dL (ref 70–99)
Potassium: 4.4 mmol/L (ref 3.5–5.1)
SODIUM: 139 mmol/L (ref 135–145)
Total Bilirubin: 0.6 mg/dL (ref 0.3–1.2)
Total Protein: 6.8 g/dL (ref 6.0–8.3)

## 2014-06-29 LAB — CBC WITH DIFFERENTIAL/PLATELET
BASOS ABS: 0 10*3/uL (ref 0.0–0.1)
BASOS PCT: 0 % (ref 0–1)
EOS PCT: 4 % (ref 0–5)
Eosinophils Absolute: 0.1 10*3/uL (ref 0.0–0.7)
HCT: 39.6 % (ref 36.0–46.0)
Hemoglobin: 13.4 g/dL (ref 12.0–15.0)
Lymphocytes Relative: 33 % (ref 12–46)
Lymphs Abs: 1.2 10*3/uL (ref 0.7–4.0)
MCH: 32.7 pg (ref 26.0–34.0)
MCHC: 33.8 g/dL (ref 30.0–36.0)
MCV: 96.6 fL (ref 78.0–100.0)
MONO ABS: 0.3 10*3/uL (ref 0.1–1.0)
Monocytes Relative: 9 % (ref 3–12)
Neutro Abs: 2 10*3/uL (ref 1.7–7.7)
Neutrophils Relative %: 54 % (ref 43–77)
Platelets: 213 10*3/uL (ref 150–400)
RBC: 4.1 MIL/uL (ref 3.87–5.11)
RDW: 12.8 % (ref 11.5–15.5)
WBC: 3.7 10*3/uL — ABNORMAL LOW (ref 4.0–10.5)

## 2014-06-29 LAB — LACTATE DEHYDROGENASE: LDH: 155 U/L (ref 94–250)

## 2014-06-29 NOTE — Progress Notes (Signed)
LABS FOR MM,KLLC,LDH,CBCD,CMP

## 2014-07-01 LAB — KAPPA/LAMBDA LIGHT CHAINS
Kappa free light chain: 14.91 mg/L (ref 3.30–19.40)
Kappa, lambda light chain ratio: 1.06 (ref 0.26–1.65)
LAMDA FREE LIGHT CHAINS: 14.01 mg/L (ref 5.71–26.30)

## 2014-07-03 LAB — MULTIPLE MYELOMA PANEL, SERUM
ALBUMIN ELP: 61.7 % (ref 55.8–66.1)
Alpha-1-Globulin: 3 % (ref 2.9–4.9)
Alpha-2-Globulin: 11.6 % (ref 7.1–11.8)
BETA 2: 5 % (ref 3.2–6.5)
BETA GLOBULIN: 5.3 % (ref 4.7–7.2)
GAMMA GLOBULIN: 13.4 % (ref 11.1–18.8)
IgA: 136 mg/dL (ref 69–380)
IgG (Immunoglobin G), Serum: 1050 mg/dL (ref 690–1700)
IgM, Serum: 39 mg/dL — ABNORMAL LOW (ref 52–322)
M-SPIKE, %: 0.2 g/dL
Total Protein: 6.5 g/dL (ref 6.0–8.3)

## 2014-07-04 ENCOUNTER — Ambulatory Visit (HOSPITAL_COMMUNITY)
Admission: RE | Admit: 2014-07-04 | Discharge: 2014-07-04 | Disposition: A | Discharge status: Home / Self Care | Payer: 59 | Source: Ambulatory Visit | Attending: Neurosurgery | Admitting: Neurosurgery

## 2014-07-04 DIAGNOSIS — M5012 Cervical disc disorder with radiculopathy, mid-cervical region: Secondary | ICD-10-CM | POA: Diagnosis not present

## 2014-07-04 DIAGNOSIS — M5011 Cervical disc disorder with radiculopathy,  high cervical region: Secondary | ICD-10-CM | POA: Diagnosis not present

## 2014-07-04 DIAGNOSIS — Z8579 Personal history of other malignant neoplasms of lymphoid, hematopoietic and related tissues: Secondary | ICD-10-CM | POA: Diagnosis not present

## 2014-07-04 DIAGNOSIS — M542 Cervicalgia: Secondary | ICD-10-CM | POA: Diagnosis present

## 2014-07-04 MED ORDER — GADOBENATE DIMEGLUMINE 529 MG/ML IV SOLN
13.0000 mL | Freq: Once | INTRAVENOUS | Status: AC | PRN
  Administered 2014-07-04: 13 mL via INTRAVENOUS

## 2014-09-14 ENCOUNTER — Encounter: Payer: Self-pay | Admitting: Family Medicine

## 2014-09-20 ENCOUNTER — Ambulatory Visit: Payer: 59 | Admitting: Family Medicine

## 2014-09-21 ENCOUNTER — Ambulatory Visit: Payer: 59 | Admitting: Family Medicine

## 2014-09-28 ENCOUNTER — Encounter (HOSPITAL_COMMUNITY): Payer: 59 | Attending: Hematology and Oncology

## 2014-09-28 DIAGNOSIS — C9000 Multiple myeloma not having achieved remission: Secondary | ICD-10-CM | POA: Insufficient documentation

## 2014-09-28 DIAGNOSIS — G629 Polyneuropathy, unspecified: Secondary | ICD-10-CM | POA: Insufficient documentation

## 2014-09-28 DIAGNOSIS — E039 Hypothyroidism, unspecified: Secondary | ICD-10-CM | POA: Insufficient documentation

## 2014-10-10 ENCOUNTER — Encounter (HOSPITAL_COMMUNITY): Payer: 59

## 2014-10-10 ENCOUNTER — Other Ambulatory Visit (HOSPITAL_COMMUNITY): Payer: Self-pay | Admitting: *Deleted

## 2014-10-10 DIAGNOSIS — C9000 Multiple myeloma not having achieved remission: Secondary | ICD-10-CM

## 2014-10-10 DIAGNOSIS — E039 Hypothyroidism, unspecified: Secondary | ICD-10-CM | POA: Diagnosis not present

## 2014-10-10 DIAGNOSIS — G629 Polyneuropathy, unspecified: Secondary | ICD-10-CM | POA: Diagnosis not present

## 2014-10-10 LAB — COMPREHENSIVE METABOLIC PANEL
ALK PHOS: 47 U/L (ref 38–126)
ALT: 19 U/L (ref 14–54)
AST: 21 U/L (ref 15–41)
Albumin: 3.9 g/dL (ref 3.5–5.0)
Anion gap: 8 (ref 5–15)
BILIRUBIN TOTAL: 0.6 mg/dL (ref 0.3–1.2)
BUN: 14 mg/dL (ref 6–20)
CO2: 29 mmol/L (ref 22–32)
Calcium: 8.9 mg/dL (ref 8.9–10.3)
Chloride: 102 mmol/L (ref 101–111)
Creatinine, Ser: 0.87 mg/dL (ref 0.44–1.00)
GLUCOSE: 158 mg/dL — AB (ref 65–99)
POTASSIUM: 3.9 mmol/L (ref 3.5–5.1)
Sodium: 139 mmol/L (ref 135–145)
Total Protein: 6.8 g/dL (ref 6.5–8.1)

## 2014-10-10 LAB — CBC WITH DIFFERENTIAL/PLATELET
BASOS ABS: 0 10*3/uL (ref 0.0–0.1)
Basophils Relative: 0 % (ref 0–1)
Eosinophils Absolute: 0.2 10*3/uL (ref 0.0–0.7)
Eosinophils Relative: 5 % (ref 0–5)
HEMATOCRIT: 37.5 % (ref 36.0–46.0)
Hemoglobin: 12.8 g/dL (ref 12.0–15.0)
LYMPHS PCT: 32 % (ref 12–46)
Lymphs Abs: 1.4 10*3/uL (ref 0.7–4.0)
MCH: 32.9 pg (ref 26.0–34.0)
MCHC: 34.1 g/dL (ref 30.0–36.0)
MCV: 96.4 fL (ref 78.0–100.0)
Monocytes Absolute: 0.4 10*3/uL (ref 0.1–1.0)
Monocytes Relative: 9 % (ref 3–12)
NEUTROS ABS: 2.4 10*3/uL (ref 1.7–7.7)
Neutrophils Relative %: 54 % (ref 43–77)
Platelets: 199 10*3/uL (ref 150–400)
RBC: 3.89 MIL/uL (ref 3.87–5.11)
RDW: 13.1 % (ref 11.5–15.5)
WBC: 4.4 10*3/uL (ref 4.0–10.5)

## 2014-10-10 LAB — LACTATE DEHYDROGENASE: LDH: 143 U/L (ref 98–192)

## 2014-10-10 NOTE — Progress Notes (Unsigned)
LABS TO BE DRAWN BY SHIRLEY MITCHELL IN THE MAIN LAB. ALREADY HAS THE ORDERS.

## 2014-10-11 LAB — MULTIPLE MYELOMA PANEL, SERUM
ALPHA 1: 0.2 g/dL (ref 0.0–0.4)
ALPHA2 GLOB SERPL ELPH-MCNC: 0.7 g/dL (ref 0.4–1.0)
Albumin SerPl Elph-Mcnc: 3.7 g/dL (ref 2.9–4.4)
Albumin/Glob SerPl: 1.4 (ref 0.7–1.7)
B-Globulin SerPl Elph-Mcnc: 1 g/dL (ref 0.7–1.3)
Gamma Glob SerPl Elph-Mcnc: 1 g/dL (ref 0.4–1.8)
Globulin, Total: 2.8 g/dL (ref 2.2–3.9)
IGA: 148 mg/dL (ref 87–352)
IGM, SERUM: 35 mg/dL (ref 26–217)
IgG (Immunoglobin G), Serum: 941 mg/dL (ref 700–1600)
TOTAL PROTEIN ELP: 6.5 g/dL (ref 6.0–8.5)

## 2014-10-11 LAB — VITAMIN D 25 HYDROXY (VIT D DEFICIENCY, FRACTURES): VIT D 25 HYDROXY: 43.7 ng/mL (ref 30.0–100.0)

## 2014-10-11 LAB — KAPPA/LAMBDA LIGHT CHAINS
KAPPA FREE LGHT CHN: 14.21 mg/L (ref 3.30–19.40)
Kappa, lambda light chain ratio: 0.99 (ref 0.26–1.65)
Lambda free light chains: 14.32 mg/L (ref 5.71–26.30)

## 2014-11-26 ENCOUNTER — Other Ambulatory Visit: Payer: Self-pay | Admitting: Family Medicine

## 2014-11-26 NOTE — Telephone Encounter (Signed)
Needs office visit.

## 2014-12-17 ENCOUNTER — Other Ambulatory Visit (HOSPITAL_COMMUNITY): Payer: Self-pay | Admitting: *Deleted

## 2014-12-17 ENCOUNTER — Other Ambulatory Visit: Payer: Self-pay | Admitting: Family Medicine

## 2014-12-17 ENCOUNTER — Telehealth: Payer: Self-pay | Admitting: Family Medicine

## 2014-12-17 ENCOUNTER — Other Ambulatory Visit: Payer: Self-pay | Admitting: Nurse Practitioner

## 2014-12-17 DIAGNOSIS — C9000 Multiple myeloma not having achieved remission: Secondary | ICD-10-CM

## 2014-12-17 DIAGNOSIS — Z1231 Encounter for screening mammogram for malignant neoplasm of breast: Secondary | ICD-10-CM

## 2014-12-17 DIAGNOSIS — E039 Hypothyroidism, unspecified: Secondary | ICD-10-CM

## 2014-12-17 DIAGNOSIS — Z13220 Encounter for screening for lipoid disorders: Secondary | ICD-10-CM

## 2014-12-17 NOTE — Telephone Encounter (Signed)
Pt notified. 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 Labs faxed to [phone].

## 2014-12-17 NOTE — Telephone Encounter (Signed)
Ordered 2 labs; others were just done by specialist

## 2014-12-17 NOTE — Telephone Encounter (Signed)
Patient has appointment on 9/13 with Hoyle Sauer for med check and would like labs done.

## 2014-12-17 NOTE — Telephone Encounter (Signed)
[facility] 12/17/14

## 2014-12-20 ENCOUNTER — Other Ambulatory Visit (HOSPITAL_COMMUNITY): Payer: Self-pay | Admitting: *Deleted

## 2014-12-20 DIAGNOSIS — C9000 Multiple myeloma not having achieved remission: Secondary | ICD-10-CM

## 2014-12-21 ENCOUNTER — Ambulatory Visit (HOSPITAL_COMMUNITY)
Admission: RE | Admit: 2014-12-21 | Discharge: 2014-12-21 | Disposition: A | Discharge status: Home / Self Care | Payer: 59 | Source: Ambulatory Visit | Attending: *Deleted | Admitting: *Deleted

## 2014-12-21 DIAGNOSIS — Z780 Asymptomatic menopausal state: Secondary | ICD-10-CM | POA: Insufficient documentation

## 2014-12-21 DIAGNOSIS — C9000 Multiple myeloma not having achieved remission: Secondary | ICD-10-CM | POA: Insufficient documentation

## 2014-12-27 ENCOUNTER — Other Ambulatory Visit (HOSPITAL_COMMUNITY)
Admission: RE | Admit: 2014-12-27 | Discharge: 2014-12-27 | Disposition: A | Discharge status: Home / Self Care | Payer: 59 | Source: Ambulatory Visit | Attending: Nurse Practitioner | Admitting: Nurse Practitioner

## 2014-12-27 ENCOUNTER — Encounter (HOSPITAL_COMMUNITY): Payer: 59 | Attending: Hematology and Oncology

## 2014-12-27 DIAGNOSIS — G629 Polyneuropathy, unspecified: Secondary | ICD-10-CM | POA: Diagnosis not present

## 2014-12-27 DIAGNOSIS — Z13220 Encounter for screening for lipoid disorders: Secondary | ICD-10-CM | POA: Insufficient documentation

## 2014-12-27 DIAGNOSIS — E039 Hypothyroidism, unspecified: Secondary | ICD-10-CM | POA: Insufficient documentation

## 2014-12-27 DIAGNOSIS — C9000 Multiple myeloma not having achieved remission: Secondary | ICD-10-CM | POA: Diagnosis not present

## 2014-12-27 LAB — COMPREHENSIVE METABOLIC PANEL
ALBUMIN: 4.3 g/dL (ref 3.5–5.0)
ALK PHOS: 49 U/L (ref 38–126)
ALT: 19 U/L (ref 14–54)
ANION GAP: 7 (ref 5–15)
AST: 21 U/L (ref 15–41)
BILIRUBIN TOTAL: 0.7 mg/dL (ref 0.3–1.2)
BUN: 16 mg/dL (ref 6–20)
CALCIUM: 9.4 mg/dL (ref 8.9–10.3)
CO2: 31 mmol/L (ref 22–32)
Chloride: 104 mmol/L (ref 101–111)
Creatinine, Ser: 0.91 mg/dL (ref 0.44–1.00)
GFR calc Af Amer: >60 mL/min (ref >60)
GLUCOSE: 97 mg/dL (ref 65–99)
Potassium: 4.1 mmol/L (ref 3.5–5.1)
Sodium: 142 mmol/L (ref 135–145)
TOTAL PROTEIN: 7.4 g/dL (ref 6.5–8.1)

## 2014-12-27 LAB — CBC WITH DIFFERENTIAL/PLATELET
BASOS ABS: 0 10*3/uL (ref 0.0–0.1)
Basophils Relative: 0 % (ref 0–1)
Eosinophils Absolute: 0.2 10*3/uL (ref 0.0–0.7)
Eosinophils Relative: 4 % (ref 0–5)
HEMATOCRIT: 42 % (ref 36.0–46.0)
HEMOGLOBIN: 14.8 g/dL (ref 12.0–15.0)
LYMPHS PCT: 28 % (ref 12–46)
Lymphs Abs: 1.5 10*3/uL (ref 0.7–4.0)
MCH: 33.7 pg (ref 26.0–34.0)
MCHC: 35.2 g/dL (ref 30.0–36.0)
MCV: 95.7 fL (ref 78.0–100.0)
MONO ABS: 0.5 10*3/uL (ref 0.1–1.0)
Monocytes Relative: 9 % (ref 3–12)
NEUTROS ABS: 3.2 10*3/uL (ref 1.7–7.7)
NEUTROS PCT: 59 % (ref 43–77)
Platelets: 214 10*3/uL (ref 150–400)
RBC: 4.39 MIL/uL (ref 3.87–5.11)
RDW: 12.7 % (ref 11.5–15.5)
WBC: 5.4 10*3/uL (ref 4.0–10.5)

## 2014-12-27 LAB — LIPID PANEL
Cholesterol: 183 mg/dL (ref 0–200)
HDL: 59 mg/dL (ref >40)
LDL CALC: 111 mg/dL — AB (ref 0–99)
TRIGLYCERIDES: 64 mg/dL (ref <150)
Total CHOL/HDL Ratio: 3.1 RATIO
VLDL: 13 mg/dL (ref 0–40)

## 2014-12-27 LAB — LACTATE DEHYDROGENASE: LDH: 144 U/L (ref 98–192)

## 2014-12-27 LAB — TSH: TSH: 0.705 u[IU]/mL (ref 0.350–4.500)

## 2014-12-27 NOTE — Progress Notes (Signed)
Labs drawn in main lab by vp team

## 2014-12-28 ENCOUNTER — Ambulatory Visit (HOSPITAL_COMMUNITY): Payer: 59

## 2014-12-28 ENCOUNTER — Encounter (HOSPITAL_COMMUNITY): Payer: 59

## 2014-12-28 LAB — MULTIPLE MYELOMA PANEL, SERUM
ALBUMIN/GLOB SERPL: 1.3 (ref 0.7–1.7)
ALPHA 1: 0.2 g/dL (ref 0.0–0.4)
Albumin SerPl Elph-Mcnc: 3.9 g/dL (ref 2.9–4.4)
Alpha2 Glob SerPl Elph-Mcnc: 0.8 g/dL (ref 0.4–1.0)
B-Globulin SerPl Elph-Mcnc: 1.2 g/dL (ref 0.7–1.3)
Gamma Glob SerPl Elph-Mcnc: 1 g/dL (ref 0.4–1.8)
Globulin, Total: 3.2 g/dL (ref 2.2–3.9)
IGA: 156 mg/dL (ref 87–352)
IGG (IMMUNOGLOBIN G), SERUM: 989 mg/dL (ref 700–1600)
IGM, SERUM: 43 mg/dL (ref 26–217)
TOTAL PROTEIN ELP: 7.1 g/dL (ref 6.0–8.5)

## 2014-12-28 LAB — KAPPA/LAMBDA LIGHT CHAINS
KAPPA FREE LGHT CHN: 16.45 mg/L (ref 3.30–19.40)
Kappa, lambda light chain ratio: 1.12 (ref 0.26–1.65)
LAMDA FREE LIGHT CHAINS: 14.65 mg/L (ref 5.71–26.30)

## 2015-01-03 ENCOUNTER — Ambulatory Visit (HOSPITAL_COMMUNITY)
Admission: RE | Admit: 2015-01-03 | Discharge: 2015-01-03 | Disposition: A | Discharge status: Home / Self Care | Payer: 59 | Source: Ambulatory Visit | Attending: Family Medicine | Admitting: Family Medicine

## 2015-01-03 DIAGNOSIS — Z1231 Encounter for screening mammogram for malignant neoplasm of breast: Secondary | ICD-10-CM | POA: Diagnosis present

## 2015-01-08 ENCOUNTER — Encounter: Payer: Self-pay | Admitting: Nurse Practitioner

## 2015-01-08 ENCOUNTER — Ambulatory Visit (INDEPENDENT_AMBULATORY_CARE_PROVIDER_SITE_OTHER): Payer: 59 | Admitting: Nurse Practitioner

## 2015-01-08 VITALS — BP 120/78 | Ht 62.0 in | Wt 147.6 lb

## 2015-01-08 DIAGNOSIS — E039 Hypothyroidism, unspecified: Secondary | ICD-10-CM | POA: Diagnosis not present

## 2015-01-08 DIAGNOSIS — Z780 Asymptomatic menopausal state: Secondary | ICD-10-CM

## 2015-01-08 DIAGNOSIS — L659 Nonscarring hair loss, unspecified: Secondary | ICD-10-CM | POA: Diagnosis not present

## 2015-01-08 MED ORDER — LEVOTHYROXINE SODIUM 75 MCG PO TABS: ORAL_TABLET | ORAL | Status: DC

## 2015-01-08 NOTE — Progress Notes (Signed)
Subjective:  Presents for c/o increased hair loss for the past 3 weeks. No change in stress. Had a recent TSH which was stable. Has had no menses since early 41s. Regular follow up with GYN. No bald areas. Family history of female pattern baldness.  Objective:   BP 120/78 mmHg  Ht 5\' 2"  (1.575 m)  Wt 147 lb 9.6 oz (66.951 kg)  BMI 26.99 kg/m2 NAD. Alert, oriented. Lungs clear. Heart RRR. Thyroid: no masses or goiter; non tender. No excessive hair thinning. 12/27/14 TSH 0.7.  Assessment:  Problem List Items Addressed This Visit      Endocrine   Hypothyroidism (Chronic)   Relevant Medications   levothyroxine (SYNTHROID, LEVOTHROID) 75 MCG tablet    Other Visit Diagnoses    Menopause    -  Primary    Hair loss          Plan:  Meds ordered this encounter  Medications  . levothyroxine (SYNTHROID, LEVOTHROID) 75 MCG tablet    Sig: TAKE 1 TABLET (75 MCG TOTAL) BY MOUTH DAILY.    Dispense:  90 tablet    Refill:  3    Order Specific Question:  Supervising Provider    Answer:  Mikey Kirschner [[id number]]   Continue sulfate free shampoo. Discussed options. No further intervention at this time.  Return if symptoms worsen or fail to improve.

## 2015-03-27 NOTE — Progress Notes (Signed)
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 female returns for follow-up of Myeloma. She is currently off therapy. She is also seen at [facility] for ongoing surveillance. She has undergone BM transplant and took maintenance revlimid for 3.5 years which she discontinued in October of 2014.  She works full time. She feels well and is without complaints.   She had a mammogram in September and a bone density in August.   MEDICAL HISTORY: Past Medical History  Diagnosis Date  . Multiple myeloma   . Thyroid disease   . Peripheral neuropathy ([place])   . Hypothyroidism   . Hypertension 03/2013    stage 1    has Multiple myeloma (HCC) and Hypothyroidism on her problem list.     is allergic to hydromorphone hcl; poison ivy extract; and tape.   SURGICAL HISTORY: Past Surgical History  Procedure Laterality Date  . Tubal ligation    . Tonsilectomy, adenoidectomy, bilateral myringotomy and tubes    . Cesarean section  06/1993  . Cataract extraction w/phaco Right 04/04/2013    Procedure: CATARACT EXTRACTION PHACO AND INTRAOCULAR LENS PLACEMENT (IOC);  Surgeon: Elta Guadeloupe T. Gershon Crane, MD;  Location: [facility];  Service: Ophthalmology;  Laterality: Right;  CDE:3.42  . Cataract extraction w/phaco Left 04/18/2013    Procedure: CATARACT EXTRACTION PHACO AND INTRAOCULAR LENS PLACEMENT (IOC);  Surgeon: Elta Guadeloupe T. Gershon Crane, MD;  Location: [facility];  Service: Ophthalmology;  Laterality: Left;  CDE:4.91    SOCIAL HISTORY: Social History   Social History  . Marital Status: Married    Spouse Name: N/A  . Number of Children: N/A   . Years of Education: N/A   Occupational History  . Not on file.   Social History Main Topics  . Smoking status: Never Smoker   . Smokeless tobacco: Never Used  . Alcohol Use: 0.0 oz/week     Comment: occ  . Drug Use: No  . Sexual Activity: Not Currently    Birth Control/ Protection: Post-menopausal   Other Topics Concern  . Not on file   Social History Narrative    FAMILY HISTORY: Family History  Problem Relation Age of Onset  . Hypertension Mother   . Heart attack Mother   . Diabetes Mother   . Skin cancer Mother   . Hypertension Father   . Heart attack Father   . Skin cancer Father   . Heart disease Maternal Grandmother   . Stroke Maternal Grandfather   . Hypertension Sister   . Skin cancer Sister   . Hypertension Brother   . Skin cancer Brother   . Hypertension Brother   . Cancer Brother     melanoma  . Skin cancer Brother   . Factor V Leiden deficiency Daughter     Review of Systems  Constitutional: Negative for fever, chills, weight loss and malaise/fatigue.  HENT: Negative for congestion, hearing loss, nosebleeds, sore throat and tinnitus.   Eyes: Negative for blurred vision, double vision, pain and discharge.  Respiratory: Negative for cough, hemoptysis, sputum production, shortness of breath and wheezing.   Cardiovascular: Negative for chest pain, palpitations, claudication, leg  swelling and PND.  Gastrointestinal: Negative for heartburn, nausea, vomiting, abdominal pain, diarrhea, constipation, blood in stool and melena.  Genitourinary: Negative for dysuria, urgency, frequency and hematuria.  Musculoskeletal: Negative for myalgias, joint pain and falls.  Skin: Negative for itching and rash.  Neurological: Negative for dizziness, tingling, tremors, sensory change, speech change, focal weakness, seizures, loss of consciousness, weakness and headaches.  Endo/Heme/Allergies: Does not bruise/bleed easily.  Psychiatric/Behavioral: Negative for depression,  suicidal ideas, memory loss and substance abuse. The patient is not nervous/anxious and does not have insomnia.     PHYSICAL EXAMINATION  ECOG PERFORMANCE STATUS: 0 - Asymptomatic  Filed Vitals:   03/29/15 1239  BP: 114/71  Pulse: 78  Temp: 98.1 F (36.7 C)  Resp: 16    Physical Exam  Constitutional: She is oriented to person, place, and time and well-developed, well-nourished, and in no distress.  HENT:  Head: Normocephalic and atraumatic.  Nose: Nose normal.  Mouth/Throat: Oropharynx is clear and moist. No oropharyngeal exudate.  Eyes: Conjunctivae and EOM are normal. Pupils are equal, round, and reactive to light. Right eye exhibits no discharge. Left eye exhibits no discharge. No scleral icterus.  Neck: Normal range of motion. Neck supple. No tracheal deviation present. No thyromegaly present.  Cardiovascular: Normal rate, regular rhythm and normal heart sounds.  Exam reveals no gallop and no friction rub.   No murmur heard. Pulmonary/Chest: Effort normal and breath sounds normal. She has no wheezes. She has no rales.  Abdominal: Soft. Bowel sounds are normal. She exhibits no distension and no mass. There is no tenderness. There is no rebound and no guarding.  Musculoskeletal: Normal range of motion. She exhibits no edema.  Lymphadenopathy:    She has no cervical adenopathy.  Neurological: She is alert and oriented to person, place, and time. She has normal reflexes. No cranial nerve deficit. Gait normal. Coordination normal.  Skin: Skin is warm and dry. No rash noted.  Psychiatric: Mood, memory, affect and judgment normal.  Nursing note and vitals reviewed.   LABORATORY DATA: I have reviewed the data as listed. CBC    Component Value Date/Time   WBC 3.7* 03/29/2015 1229   RBC 4.24 03/29/2015 1229   HGB 14.1 03/29/2015 1229   HCT 40.9 03/29/2015 1229   PLT 223 03/29/2015 1229   MCV 96.5 03/29/2015 1229   MCH 33.3 03/29/2015 1229   MCHC 34.5 03/29/2015 1229   RDW  12.6 03/29/2015 1229   LYMPHSABS 1.3 03/29/2015 1229   MONOABS 0.3 03/29/2015 1229   EOSABS 0.1 03/29/2015 1229   BASOSABS 0.0 03/29/2015 1229    CMP     Component Value Date/Time   NA 141 03/29/2015 1229   K 4.1 03/29/2015 1229   CL 103 03/29/2015 1229   CO2 30 03/29/2015 1229   GLUCOSE 102* 03/29/2015 1229   BUN 20 03/29/2015 1229   CREATININE 0.85 03/29/2015 1229   CALCIUM 9.3 03/29/2015 1229   PROT 7.3 03/29/2015 1229   ALBUMIN 4.3 03/29/2015 1229   AST 22 03/29/2015 1229   ALT 20 03/29/2015 1229   ALKPHOS 53 03/29/2015 1229   BILITOT 0.6 03/29/2015 1229   GFRNONAA >60 03/29/2015 1229   GFRAA >60 03/29/2015 1229   Results for EMILYNN, SRINIVASAN (MRN [id number])   Ref. Range 03/29/2015 12:29  LDH Latest Ref Range: 98-192 U/L 148  Iron Latest Ref Range: 28-170 ug/dL 75  UIBC Latest Units: ug/dL 241  TIBC Latest Ref Range: 250-450 ug/dL 316  Saturation Ratios Latest Ref  Range: 10.4-31.8 % 24  Ferritin Latest Ref Range: 11-307 ng/mL 112  Folate Latest Ref Range: >5.9 ng/mL 18.8   Results for LUDENE, STOKKE (MRN [id number])   Ref. 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  I advised her of well care issues. She is up to date on mammography.  We reviewed guidelines on screening colonoscopy as well.  If she has problems or concerns she can notify us in the interim. Otherwise we can see her prn.  Orders Placed This  Encounter  Procedures  . CBC with Differential    Standing Status: Future     Number of Occurrences: 1     Standing Expiration Date: 03/28/2016  . Comprehensive metabolic panel    Standing Status: Future     Number of Occurrences: 1     Standing Expiration Date: 03/28/2016  . Multiple myeloma panel, serum    Standing Status: Future     Number of Occurrences: 1     Standing Expiration Date: 03/28/2016  . Kappa/lambda light chains    Standing Status: Future     Number of Occurrences: 1     Standing Expiration Date: 03/28/2016  . Lactate dehydrogenase    Standing Status: Future     Number of Occurrences: 1     Standing Expiration Date: 03/28/2016  . Vitamin D 25 hydroxy  . Iron and TIBC  . Ferritin  . Folate  . CBC with Differential    Standing Status: Standing     Number of Occurrences: 6     Standing Expiration Date: 03/28/2017  . Comprehensive metabolic panel    Standing Status: Standing     Number of Occurrences: 6     Standing Expiration Date: 03/28/2017  . Lactate dehydrogenase    Standing Status: Standing     Number of Occurrences: 6     Standing Expiration Date: 03/28/2017  . Multiple myeloma panel, serum    Standing Status: Standing     Number of Occurrences: 6     Standing Expiration Date: 03/28/2017  . Kappa/lambda light chains    Standing Status: Standing     Number of Occurrences: 6     Standing Expiration Date: 03/28/2017    BMT ATTENDING NOTE: shared clinic visit with Loistine Simas, PA-C.  I have personally interviewed and evaluated [place] Leadbetter. I have reviewed the lab results and ancillary tests and discussed findings with her and the patient. I agree with Liz's findings and treatment plan as presented in note.   Mr. Hinkson has no complaints and is doing a full active life between work and personal life. She has detectable clonal protein by immunofixation only and has remained with negative paraprotein and normal light chain ratio for a while now.  She is 5 years out of transplant and focused most of the visit today on long-term complications from treatment, surveillance milestones, and discussing transition to survivorship clinic.  She will  benefit from annual myeloma studies, folate, iron panel, Vitamin D level, QOL questionaire. She needs annual eye exam, bi-annual dental evaluation, bone density scan every 2 years, and colonoscopy every 10 years.   Will continue to monitor laboratory data and ongoing problems to determine future care as appropriate.   Electronically signed by: Reola Calkins, MD 09/10/14 2259    All questions were answered. The patient knows to call the clinic with any problems, questions or concerns. We can certainly see the patient much sooner if necessary.  This document serves as a record of services personally performed by Ancil Linsey, MD. It was created on her behalf by Arlyce Harman, a trained medical scribe. The creation of this record is based on the scribe's personal observations and the provider's statements to them. This document has been checked and approved by the attending provider.  I have reviewed the above documentation for accuracy and completeness, and I agree with the above.  This note was electronically signed. Molli Hazard, MD  05/16/2015

## 2015-03-29 ENCOUNTER — Encounter (HOSPITAL_COMMUNITY): Payer: 59

## 2015-03-29 ENCOUNTER — Encounter (HOSPITAL_COMMUNITY): Payer: Self-pay | Admitting: Hematology & Oncology

## 2015-03-29 ENCOUNTER — Encounter (HOSPITAL_COMMUNITY): Payer: 59 | Attending: Hematology and Oncology

## 2015-03-29 ENCOUNTER — Ambulatory Visit (HOSPITAL_COMMUNITY): Payer: 59 | Admitting: Hematology & Oncology

## 2015-03-29 ENCOUNTER — Encounter (HOSPITAL_BASED_OUTPATIENT_CLINIC_OR_DEPARTMENT_OTHER): Payer: 59 | Admitting: Hematology & Oncology

## 2015-03-29 VITALS — BP 114/71 | HR 78 | Temp 98.1°F | Resp 16 | Wt 147.5 lb

## 2015-03-29 DIAGNOSIS — C9000 Multiple myeloma not having achieved remission: Secondary | ICD-10-CM

## 2015-03-29 DIAGNOSIS — G629 Polyneuropathy, unspecified: Secondary | ICD-10-CM | POA: Insufficient documentation

## 2015-03-29 DIAGNOSIS — E039 Hypothyroidism, unspecified: Secondary | ICD-10-CM | POA: Insufficient documentation

## 2015-03-29 LAB — COMPREHENSIVE METABOLIC PANEL
ALK PHOS: 53 U/L (ref 38–126)
ALT: 20 U/L (ref 14–54)
AST: 22 U/L (ref 15–41)
Albumin: 4.3 g/dL (ref 3.5–5.0)
Anion gap: 8 (ref 5–15)
BUN: 20 mg/dL (ref 6–20)
CALCIUM: 9.3 mg/dL (ref 8.9–10.3)
CO2: 30 mmol/L (ref 22–32)
CREATININE: 0.85 mg/dL (ref 0.44–1.00)
Chloride: 103 mmol/L (ref 101–111)
GFR calc non Af Amer: >60 mL/min (ref >60)
GLUCOSE: 102 mg/dL — AB (ref 65–99)
Potassium: 4.1 mmol/L (ref 3.5–5.1)
SODIUM: 141 mmol/L (ref 135–145)
Total Bilirubin: 0.6 mg/dL (ref 0.3–1.2)
Total Protein: 7.3 g/dL (ref 6.5–8.1)

## 2015-03-29 LAB — CBC WITH DIFFERENTIAL/PLATELET
BASOS ABS: 0 10*3/uL (ref 0.0–0.1)
Basophils Relative: 0 %
EOS ABS: 0.1 10*3/uL (ref 0.0–0.7)
Eosinophils Relative: 4 %
HCT: 40.9 % (ref 36.0–46.0)
HEMOGLOBIN: 14.1 g/dL (ref 12.0–15.0)
LYMPHS ABS: 1.3 10*3/uL (ref 0.7–4.0)
LYMPHS PCT: 34 %
MCH: 33.3 pg (ref 26.0–34.0)
MCHC: 34.5 g/dL (ref 30.0–36.0)
MCV: 96.5 fL (ref 78.0–100.0)
Monocytes Absolute: 0.3 10*3/uL (ref 0.1–1.0)
Monocytes Relative: 8 %
NEUTROS PCT: 54 %
Neutro Abs: 2 10*3/uL (ref 1.7–7.7)
Platelets: 223 10*3/uL (ref 150–400)
RBC: 4.24 MIL/uL (ref 3.87–5.11)
RDW: 12.6 % (ref 11.5–15.5)
WBC: 3.7 10*3/uL — AB (ref 4.0–10.5)

## 2015-03-29 LAB — IRON AND TIBC
Iron: 75 ug/dL (ref 28–170)
SATURATION RATIOS: 24 % (ref 10.4–31.8)
TIBC: 316 ug/dL (ref 250–450)
UIBC: 241 ug/dL

## 2015-03-29 LAB — LACTATE DEHYDROGENASE: LDH: 148 U/L (ref 98–192)

## 2015-03-29 LAB — FOLATE: FOLATE: 18.8 ng/mL (ref >5.9)

## 2015-03-29 LAB — FERRITIN: Ferritin: 112 ng/mL (ref 11–307)

## 2015-03-29 NOTE — Progress Notes (Signed)
LABS DRAWN

## 2015-03-29 NOTE — Patient Instructions (Addendum)
[place] at [facility] Discharge Instructions  RECOMMENDATIONS MADE BY THE CONSULTANT AND ANY TEST RESULTS WILL BE SENT TO YOUR REFERRING PHYSICIAN.  Exam completed by Dr Whitney Muse today  folate, iron panel, and vitamin D today Eye exam yearly Dental exam twice year Bone density  Colonoscopy Labs every 3 months No follow up just call us if you need [place] Please call the clinic if you have any questions or concerns  Thank you for choosing [place] at [facility] to provide your oncology and hematology care.  To afford each patient quality time with our provider, please arrive at least 15 minutes before your scheduled appointment time.    You need to re-schedule your appointment should you arrive 10 or more minutes late.  We strive to give you quality time with our providers, and arriving late affects you and other patients whose appointments are after yours.  Also, if you no show three or more times for appointments you may be dismissed from the clinic at the providers discretion.     Again, thank you for choosing [facility].  Our hope is that these requests will decrease the amount of time that you wait before being seen by our physicians.       _____________________________________________________________  Should you have questions after your visit to [facility], please contact our office at [phone] between the hours of 8:30 a.m. and 4:30 p.m.  Voicemails left after 4:30 p.m. will not be returned until the following business day.  For prescription refill requests, have your pharmacy contact our office.

## 2015-03-30 LAB — VITAMIN D 25 HYDROXY (VIT D DEFICIENCY, FRACTURES): VIT D 25 HYDROXY: 52 ng/mL (ref 30.0–100.0)

## 2015-03-31 LAB — KAPPA/LAMBDA LIGHT CHAINS
KAPPA FREE LGHT CHN: 16.57 mg/L (ref 3.30–19.40)
KAPPA, LAMDA LIGHT CHAIN RATIO: 1.14 (ref 0.26–1.65)
LAMDA FREE LIGHT CHAINS: 14.5 mg/L (ref 5.71–26.30)

## 2015-04-02 LAB — MULTIPLE MYELOMA PANEL, SERUM
ALBUMIN/GLOB SERPL: 1.5 (ref 0.7–1.7)
Albumin SerPl Elph-Mcnc: 4 g/dL (ref 2.9–4.4)
Alpha 1: 0.2 g/dL (ref 0.0–0.4)
Alpha2 Glob SerPl Elph-Mcnc: 0.8 g/dL (ref 0.4–1.0)
B-GLOBULIN SERPL ELPH-MCNC: 0.9 g/dL (ref 0.7–1.3)
GAMMA GLOB SERPL ELPH-MCNC: 0.9 g/dL (ref 0.4–1.8)
GLOBULIN, TOTAL: 2.8 g/dL (ref 2.2–3.9)
IGA: 152 mg/dL (ref 87–352)
IgG (Immunoglobin G), Serum: 943 mg/dL (ref 700–1600)
IgM, Serum: 50 mg/dL (ref 26–217)
Total Protein ELP: 6.8 g/dL (ref 6.0–8.5)

## 2015-06-04 ENCOUNTER — Telehealth: Payer: Self-pay | Admitting: Family Medicine

## 2015-06-04 MED ORDER — SCOPOLAMINE 1 MG/3DAYS TD PT72: 1.0000 | MEDICATED_PATCH | TRANSDERMAL | Status: DC

## 2015-06-04 NOTE — Telephone Encounter (Signed)
Spoke with patient and informed her per protocol seasick patches sent into pharmacy.

## 2015-06-04 NOTE — Telephone Encounter (Signed)
[facility] (seasick patches sent into pharmacy)

## 2015-06-04 NOTE — Telephone Encounter (Signed)
Pt going on a cruise an would like some patches called into Outpatient Pharm

## 2015-06-27 ENCOUNTER — Encounter (HOSPITAL_COMMUNITY): Payer: 59 | Attending: Hematology and Oncology

## 2015-06-27 DIAGNOSIS — G629 Polyneuropathy, unspecified: Secondary | ICD-10-CM | POA: Diagnosis not present

## 2015-06-27 DIAGNOSIS — C9000 Multiple myeloma not having achieved remission: Secondary | ICD-10-CM | POA: Diagnosis not present

## 2015-06-27 DIAGNOSIS — E039 Hypothyroidism, unspecified: Secondary | ICD-10-CM | POA: Insufficient documentation

## 2015-06-27 LAB — CBC WITH DIFFERENTIAL/PLATELET
Basophils Absolute: 0 10*3/uL (ref 0.0–0.1)
Basophils Relative: 0 %
Eosinophils Absolute: 0.2 10*3/uL (ref 0.0–0.7)
Eosinophils Relative: 4 %
HCT: 41.6 % (ref 36.0–46.0)
HEMOGLOBIN: 14.5 g/dL (ref 12.0–15.0)
LYMPHS ABS: 1.6 10*3/uL (ref 0.7–4.0)
LYMPHS PCT: 36 %
MCH: 33.3 pg (ref 26.0–34.0)
MCHC: 34.9 g/dL (ref 30.0–36.0)
MCV: 95.4 fL (ref 78.0–100.0)
Monocytes Absolute: 0.4 10*3/uL (ref 0.1–1.0)
Monocytes Relative: 9 %
NEUTROS PCT: 51 %
Neutro Abs: 2.3 10*3/uL (ref 1.7–7.7)
Platelets: 215 10*3/uL (ref 150–400)
RBC: 4.36 MIL/uL (ref 3.87–5.11)
RDW: 12.5 % (ref 11.5–15.5)
WBC: 4.5 10*3/uL (ref 4.0–10.5)

## 2015-06-27 LAB — COMPREHENSIVE METABOLIC PANEL
ALBUMIN: 4.3 g/dL (ref 3.5–5.0)
ALK PHOS: 48 U/L (ref 38–126)
ALT: 21 U/L (ref 14–54)
ANION GAP: 7 (ref 5–15)
AST: 22 U/L (ref 15–41)
BUN: 15 mg/dL (ref 6–20)
CALCIUM: 9.5 mg/dL (ref 8.9–10.3)
CO2: 32 mmol/L (ref 22–32)
Chloride: 103 mmol/L (ref 101–111)
Creatinine, Ser: 0.99 mg/dL (ref 0.44–1.00)
GFR calc non Af Amer: >60 mL/min (ref >60)
Glucose, Bld: 91 mg/dL (ref 65–99)
POTASSIUM: 4.2 mmol/L (ref 3.5–5.1)
SODIUM: 142 mmol/L (ref 135–145)
TOTAL PROTEIN: 7.4 g/dL (ref 6.5–8.1)
Total Bilirubin: 0.8 mg/dL (ref 0.3–1.2)

## 2015-06-27 LAB — LACTATE DEHYDROGENASE: LDH: 143 U/L (ref 98–192)

## 2015-06-28 LAB — KAPPA/LAMBDA LIGHT CHAINS
KAPPA, LAMDA LIGHT CHAIN RATIO: 1.25 (ref 0.26–1.65)
Kappa free light chain: 16.31 mg/L (ref 3.30–19.40)
Lambda free light chains: 13.08 mg/L (ref 5.71–26.30)

## 2015-06-28 LAB — MULTIPLE MYELOMA PANEL, SERUM
ALBUMIN SERPL ELPH-MCNC: 3.8 g/dL (ref 2.9–4.4)
Albumin/Glob SerPl: 1.3 (ref 0.7–1.7)
Alpha 1: 0.1 g/dL (ref 0.0–0.4)
Alpha2 Glob SerPl Elph-Mcnc: 0.8 g/dL (ref 0.4–1.0)
B-Globulin SerPl Elph-Mcnc: 1.1 g/dL (ref 0.7–1.3)
Gamma Glob SerPl Elph-Mcnc: 1.1 g/dL (ref 0.4–1.8)
Globulin, Total: 3.1 g/dL (ref 2.2–3.9)
IGA: 162 mg/dL (ref 87–352)
IgG (Immunoglobin G), Serum: 1013 mg/dL (ref 700–1600)
IgM, Serum: 41 mg/dL (ref 26–217)
TOTAL PROTEIN ELP: 6.9 g/dL (ref 6.0–8.5)

## 2015-07-08 MED FILL — LEVOTHYROXINE 75 MCG TABLET: 75 | 90 days supply | Qty: 90 | Fill #2

## 2015-07-09 DIAGNOSIS — M5020 Other cervical disc displacement, unspecified cervical region: Secondary | ICD-10-CM | POA: Diagnosis not present

## 2015-07-09 DIAGNOSIS — M542 Cervicalgia: Secondary | ICD-10-CM | POA: Diagnosis not present

## 2015-07-09 DIAGNOSIS — M5030 Other cervical disc degeneration, unspecified cervical region: Secondary | ICD-10-CM | POA: Diagnosis not present

## 2015-07-09 DIAGNOSIS — M4722 Other spondylosis with radiculopathy, cervical region: Secondary | ICD-10-CM | POA: Diagnosis not present

## 2015-08-30 DIAGNOSIS — Z9484 Stem cells transplant status: Secondary | ICD-10-CM | POA: Insufficient documentation

## 2015-08-30 DIAGNOSIS — C9001 Multiple myeloma in remission: Secondary | ICD-10-CM | POA: Diagnosis not present

## 2015-09-27 ENCOUNTER — Other Ambulatory Visit (HOSPITAL_COMMUNITY): Payer: 59

## 2015-10-21 MED FILL — LEVOTHYROXINE 75 MCG TABLET: 75 | 90 days supply | Qty: 90 | Fill #3

## 2015-11-26 ENCOUNTER — Other Ambulatory Visit (HOSPITAL_COMMUNITY)
Admission: RE | Admit: 2015-11-26 | Discharge: 2015-11-26 | Disposition: A | Discharge status: Home / Self Care | Payer: 59 | Source: Ambulatory Visit | Attending: Hematology & Oncology | Admitting: Hematology & Oncology

## 2015-11-26 DIAGNOSIS — C9000 Multiple myeloma not having achieved remission: Secondary | ICD-10-CM | POA: Insufficient documentation

## 2015-11-26 LAB — CBC WITH DIFFERENTIAL/PLATELET
BASOS ABS: 0 10*3/uL (ref 0.0–0.1)
BASOS PCT: 1 %
Eosinophils Absolute: 0.2 10*3/uL (ref 0.0–0.7)
Eosinophils Relative: 5 %
HEMATOCRIT: 39.1 % (ref 36.0–46.0)
HEMOGLOBIN: 13.5 g/dL (ref 12.0–15.0)
LYMPHS PCT: 29 %
Lymphs Abs: 1.2 10*3/uL (ref 0.7–4.0)
MCH: 32.9 pg (ref 26.0–34.0)
MCHC: 34.5 g/dL (ref 30.0–36.0)
MCV: 95.4 fL (ref 78.0–100.0)
MONO ABS: 0.3 10*3/uL (ref 0.1–1.0)
Monocytes Relative: 8 %
NEUTROS ABS: 2.4 10*3/uL (ref 1.7–7.7)
NEUTROS PCT: 57 %
Platelets: 211 10*3/uL (ref 150–400)
RBC: 4.1 MIL/uL (ref 3.87–5.11)
RDW: 12.7 % (ref 11.5–15.5)
WBC: 4.1 10*3/uL (ref 4.0–10.5)

## 2015-11-26 LAB — COMPREHENSIVE METABOLIC PANEL
ALK PHOS: 46 U/L (ref 38–126)
ALT: 20 U/L (ref 14–54)
AST: 20 U/L (ref 15–41)
Albumin: 4.1 g/dL (ref 3.5–5.0)
Anion gap: 4 — ABNORMAL LOW (ref 5–15)
BUN: 20 mg/dL (ref 6–20)
CALCIUM: 9.4 mg/dL (ref 8.9–10.3)
CHLORIDE: 107 mmol/L (ref 101–111)
CO2: 30 mmol/L (ref 22–32)
CREATININE: 0.92 mg/dL (ref 0.44–1.00)
GFR calc non Af Amer: >60 mL/min (ref >60)
Glucose, Bld: 92 mg/dL (ref 65–99)
Potassium: 4 mmol/L (ref 3.5–5.1)
SODIUM: 141 mmol/L (ref 135–145)
Total Bilirubin: 0.6 mg/dL (ref 0.3–1.2)
Total Protein: 7.2 g/dL (ref 6.5–8.1)

## 2015-11-27 LAB — MULTIPLE MYELOMA PANEL, SERUM
ALBUMIN SERPL ELPH-MCNC: 3.9 g/dL (ref 2.9–4.4)
ALPHA 1: 0.2 g/dL (ref 0.0–0.4)
ALPHA2 GLOB SERPL ELPH-MCNC: 0.8 g/dL (ref 0.4–1.0)
Albumin/Glob SerPl: 1.4 (ref 0.7–1.7)
B-Globulin SerPl Elph-Mcnc: 1 g/dL (ref 0.7–1.3)
GLOBULIN, TOTAL: 2.9 g/dL (ref 2.2–3.9)
Gamma Glob SerPl Elph-Mcnc: 1 g/dL (ref 0.4–1.8)
IGA: 168 mg/dL (ref 87–352)
IGG (IMMUNOGLOBIN G), SERUM: 908 mg/dL (ref 700–1600)
IGM, SERUM: 49 mg/dL (ref 26–217)
Total Protein ELP: 6.8 g/dL (ref 6.0–8.5)

## 2015-11-27 LAB — KAPPA/LAMBDA LIGHT CHAINS
Kappa free light chain: 14.9 mg/L (ref 3.3–19.4)
Kappa, lambda light chain ratio: 1.32 (ref 0.26–1.65)
Lambda free light chains: 11.3 mg/L (ref 5.7–26.3)

## 2016-01-02 MED FILL — CLINDAMYCIN HCL 150 MG CAPS: 150 | 9 days supply | Qty: 28 | Fill #0

## 2016-01-22 ENCOUNTER — Other Ambulatory Visit: Payer: Self-pay | Admitting: Nurse Practitioner

## 2016-01-22 MED FILL — LEVOTHYROXINE 75 MCG TABLET: 75 | 15 days supply | Qty: 15 | Fill #0

## 2016-02-11 ENCOUNTER — Telehealth: Payer: Self-pay | Admitting: Family Medicine

## 2016-02-11 DIAGNOSIS — Z131 Encounter for screening for diabetes mellitus: Secondary | ICD-10-CM

## 2016-02-11 DIAGNOSIS — Z13220 Encounter for screening for lipoid disorders: Secondary | ICD-10-CM

## 2016-02-11 DIAGNOSIS — E039 Hypothyroidism, unspecified: Secondary | ICD-10-CM

## 2016-02-11 NOTE — Telephone Encounter (Signed)
Patient requested a refill on her levothyroxine (SYNTHROID, LEVOTHROID) 75 MCG tablet on 01/22/2016.  She was given 15 tablets and we sent a note to her pharmacy explaining that she needed an office visit.    The patient is very upset because no one told her that she needed an office visit and I explained to her that we do send over the "Notes to pharmacy", and the pharmacy usually does relay the message to the patient.  She feels like someone from our office should have notified her that she needed an appointment, and that the pharmacy does not have anything to do with her office visits.  In the future, she says she would like someone from our office to contact her and tell her if she needs an appointment for a complete refill and don't leave it in the responsibility of the pharmacy.  I told her I would relay the message to the nurse, but I do not know the procedure.  She would also like our office to call [facility] and discuss the "Notes to Pharmacy" with them.    She asked for Autumn's name and when she sent in this medication and put in the note to the pharmacy and she also asked for my name.  We scheduled her an appointment for February 24, 2016, so she would like enough of her levothyroxine (SYNTHROID, LEVOTHROID) 75 MCG tablet called in to last her until her appointment.  [facility]

## 2016-02-11 NOTE — Telephone Encounter (Signed)
The patient  needs to get TSH. I also recommend lipid profile along with glucose if she is interested in doing that as well. She may have a 30 day supply of her medicine. I certainly understand her frustrations. I will be happy to discuss her frustrations when she follows up

## 2016-02-12 MED ORDER — LEVOTHYROXINE SODIUM 75 MCG PO TABS: 75.0000 ug | ORAL_TABLET | Freq: Every day | ORAL | 0 refills | Status: DC

## 2016-02-12 MED FILL — LEVOTHYROXINE 75 MCG TABLET: 75 | 30 days supply | Qty: 30 | Fill #0

## 2016-02-12 NOTE — Telephone Encounter (Signed)
Spoke with patient and informed her per Dr.Scott Luking- The patient  needs to get TSH. I also recommend lipid profile along with glucose if she is interested in doing that as well. She may have a 30 day supply of her medicine. I certainly understand her frustrations. I will be happy to discuss her frustrations when she follows up. Patient verbalized understanding.

## 2016-02-20 ENCOUNTER — Other Ambulatory Visit (HOSPITAL_COMMUNITY)
Admission: RE | Admit: 2016-02-20 | Discharge: 2016-02-20 | Disposition: A | Discharge status: Home / Self Care | Payer: 59 | Source: Ambulatory Visit | Attending: Family Medicine | Admitting: Family Medicine

## 2016-02-20 DIAGNOSIS — Z131 Encounter for screening for diabetes mellitus: Secondary | ICD-10-CM | POA: Diagnosis not present

## 2016-02-20 DIAGNOSIS — Z13220 Encounter for screening for lipoid disorders: Secondary | ICD-10-CM | POA: Diagnosis not present

## 2016-02-20 DIAGNOSIS — E039 Hypothyroidism, unspecified: Secondary | ICD-10-CM | POA: Diagnosis not present

## 2016-02-20 LAB — LIPID PANEL
Cholesterol: 185 mg/dL (ref 0–200)
HDL: 55 mg/dL (ref >40)
LDL Cholesterol: 113 mg/dL — ABNORMAL HIGH (ref 0–99)
TRIGLYCERIDES: 84 mg/dL (ref <150)
Total CHOL/HDL Ratio: 3.4 RATIO
VLDL: 17 mg/dL (ref 0–40)

## 2016-02-20 LAB — TSH: TSH: 1.215 u[IU]/mL (ref 0.350–4.500)

## 2016-02-20 LAB — GLUCOSE, RANDOM: GLUCOSE: 87 mg/dL (ref 65–99)

## 2016-02-24 ENCOUNTER — Ambulatory Visit (INDEPENDENT_AMBULATORY_CARE_PROVIDER_SITE_OTHER): Payer: 59 | Admitting: Family Medicine

## 2016-02-24 ENCOUNTER — Encounter: Payer: Self-pay | Admitting: Family Medicine

## 2016-02-24 VITALS — BP 122/76 | Ht 62.0 in | Wt 148.6 lb

## 2016-02-24 DIAGNOSIS — E039 Hypothyroidism, unspecified: Secondary | ICD-10-CM | POA: Diagnosis not present

## 2016-02-24 DIAGNOSIS — M720 Palmar fascial fibromatosis [Dupuytren]: Secondary | ICD-10-CM | POA: Diagnosis not present

## 2016-02-24 MED ORDER — LEVOTHYROXINE SODIUM 75 MCG PO TABS
75.0000 ug | ORAL_TABLET | Freq: Every day | ORAL | 3 refills | Status: DC
Start: 2016-02-24 — End: 2017-02-23

## 2016-02-24 NOTE — Patient Instructions (Signed)
DASH Eating Plan  DASH stands for "Dietary Approaches to Stop Hypertension." The DASH eating plan is a healthy eating plan that has been shown to reduce high blood pressure (hypertension). Additional health benefits may include reducing the risk of type 2 diabetes mellitus, heart disease, and stroke. The DASH eating plan may also help with weight loss.  WHAT DO I NEED TO KNOW ABOUT THE DASH EATING PLAN?  For the DASH eating plan, you will follow these general guidelines:  · Choose foods with a percent daily value for sodium of less than 5% (as listed on the food label).  · Use salt-free seasonings or herbs instead of table salt or sea salt.  · Check with your health care provider or pharmacist before using salt substitutes.  · Eat lower-sodium products, often labeled as "lower sodium" or "no salt added."  · Eat fresh foods.  · Eat more vegetables, fruits, and low-fat dairy products.  · Choose whole grains. Look for the word "whole" as the first word in the ingredient list.  · Choose fish and skinless chicken or turkey more often than red meat. Limit fish, poultry, and meat to 6 oz (170 g) each day.  · Limit sweets, desserts, sugars, and sugary drinks.  · Choose heart-healthy fats.  · Limit cheese to 1 oz (28 g) per day.  · Eat more home-cooked food and less restaurant, buffet, and fast food.  · Limit fried foods.  · Cook foods using methods other than frying.  · Limit canned vegetables. If you do use them, rinse them well to decrease the sodium.  · When eating at a restaurant, ask that your food be prepared with less salt, or no salt if possible.  WHAT FOODS CAN I EAT?  Seek help from a dietitian for individual calorie needs.  Grains  Whole grain or whole wheat bread. Brown rice. Whole grain or whole wheat pasta. Quinoa, bulgur, and whole grain cereals. Low-sodium cereals. Corn or whole wheat flour tortillas. Whole grain cornbread. Whole grain crackers. Low-sodium crackers.  Vegetables  Fresh or frozen vegetables  (raw, steamed, roasted, or grilled). Low-sodium or reduced-sodium tomato and vegetable juices. Low-sodium or reduced-sodium tomato sauce and paste. Low-sodium or reduced-sodium canned vegetables.   Fruits  All fresh, canned (in natural juice), or frozen fruits.  Meat and Other Protein Products  Ground beef (85% or leaner), grass-fed beef, or beef trimmed of fat. Skinless chicken or turkey. Ground chicken or turkey. Pork trimmed of fat. All fish and seafood. Eggs. Dried beans, peas, or lentils. Unsalted nuts and seeds. Unsalted canned beans.  Dairy  Low-fat dairy products, such as skim or 1% milk, 2% or reduced-fat cheeses, low-fat ricotta or cottage cheese, or plain low-fat yogurt. Low-sodium or reduced-sodium cheeses.  Fats and Oils  Tub margarines without trans fats. Light or reduced-fat mayonnaise and salad dressings (reduced sodium). Avocado. Safflower, olive, or canola oils. Natural peanut or almond butter.  Other  Unsalted popcorn and pretzels.  The items listed above may not be a complete list of recommended foods or beverages. Contact your dietitian for more options.  WHAT FOODS ARE NOT RECOMMENDED?  Grains  White bread. White pasta. White rice. Refined cornbread. Bagels and croissants. Crackers that contain trans fat.  Vegetables  Creamed or fried vegetables. Vegetables in a cheese sauce. Regular canned vegetables. Regular canned tomato sauce and paste. Regular tomato and vegetable juices.  Fruits  Dried fruits. Canned fruit in light or heavy syrup. Fruit juice.  Meat and Other Protein   Products  Fatty cuts of meat. Ribs, chicken wings, bacon, sausage, bologna, salami, chitterlings, fatback, hot dogs, bratwurst, and packaged luncheon meats. Salted nuts and seeds. Canned beans with salt.  Dairy  Whole or 2% milk, cream, half-and-half, and cream cheese. Whole-fat or sweetened yogurt. Full-fat cheeses or blue cheese. Nondairy creamers and whipped toppings. Processed cheese, cheese spreads, or cheese  curds.  Condiments  Onion and garlic salt, seasoned salt, table salt, and sea salt. Canned and packaged gravies. Worcestershire sauce. Tartar sauce. Barbecue sauce. Teriyaki sauce. Soy sauce, including reduced sodium. Steak sauce. Fish sauce. Oyster sauce. Cocktail sauce. Horseradish. Ketchup and mustard. Meat flavorings and tenderizers. Bouillon cubes. Hot sauce. Tabasco sauce. Marinades. Taco seasonings. Relishes.  Fats and Oils  Butter, stick margarine, lard, shortening, ghee, and bacon fat. Coconut, palm kernel, or palm oils. Regular salad dressings.  Other  Pickles and olives. Salted popcorn and pretzels.  The items listed above may not be a complete list of foods and beverages to avoid. Contact your dietitian for more information.  WHERE CAN I FIND MORE INFORMATION?  National Heart, Lung, and Blood Institute: www.nhlbi.nih.gov/health/health-topics/topics/dash/     This information is not intended to replace advice given to you by your health care provider. Make sure you discuss any questions you have with your health care provider.     Document Released: 04/02/2011 Document Revised: 05/04/2014 Document Reviewed: 02/15/2013  Elsevier Interactive Patient Education ©2016 Elsevier Inc.

## 2016-02-24 NOTE — Progress Notes (Signed)
   Subjective:    Patient ID: Melissa [name], female    DOB: 10-30-1955, 60 y.o.   MRN: [id number]  HPI Patient arrives for follow up on hypothyroidism. Patient reports not problems or concerns. The patient relates that she takes her medicine on a regular basis she also follows up with oncology on a regular basis she denies any other particular troubles  Review of Systems  Constitutional: Negative for activity change, fatigue and fever.  Respiratory: Negative for cough and shortness of breath.   Cardiovascular: Negative for chest pain and leg swelling.  Neurological: Negative for headaches.       Objective:   Physical Exam  Constitutional: She appears well-nourished. No distress.  Cardiovascular: Normal rate, regular rhythm and normal heart sounds.   No murmur heard. Pulmonary/Chest: Effort normal and breath sounds normal. No respiratory distress.  Musculoskeletal: She exhibits no edema.  Lymphadenopathy:    She has no cervical adenopathy.  Neurological: She is alert. She exhibits normal muscle tone.  Psychiatric: Her behavior is normal.  Vitals reviewed.  Depue trends contracture noted in the left hand patient interested in seen specialist       Assessment & Plan:  Hypothyroidism under good control continue current medications follow-up in one year  Mild hyperlipidemia no sign of any significant issues does not need to be on medication watch diet stay active  Depuytrans-contracture-patient interested [facility]

## 2016-03-12 MED FILL — LEVOTHYROXINE 75 MCG TABLET: 75 | 90 days supply | Qty: 90 | Fill #0

## 2016-03-24 ENCOUNTER — Ambulatory Visit (INDEPENDENT_AMBULATORY_CARE_PROVIDER_SITE_OTHER): Payer: 59 | Admitting: Orthopaedic Surgery

## 2016-03-24 ENCOUNTER — Other Ambulatory Visit (INDEPENDENT_AMBULATORY_CARE_PROVIDER_SITE_OTHER): Payer: Self-pay

## 2016-03-24 DIAGNOSIS — M720 Palmar fascial fibromatosis [Dupuytren]: Secondary | ICD-10-CM

## 2016-03-24 NOTE — Progress Notes (Signed)
Patient is a very pleasant right-hand-dominant female who is actually wanted directors of the labs with [facility]. She has a history of Dupuytren's contracture involving both her hands in both her involving the fifth ray/finger. It's worse on the left than it is the right.  On exam she is neurovascular intact on all her digits on both hands. The right hand has full flexion-extension of all the joints of the fifth ray but she can feel she is having early changes of Dupuytren's contracture. The left side a little bit more extensive with her fifth finger essentially worsen her last several years. She starting developing a slight contracture of the PIP joint. Both hands are otherwise normal.  I do feel that this needs a hand specialist to address further. I will centers [facility] [place] and we'll hopefully see either Dr. Charlotte Crumb or Dr. Daryll Brod per the patient's request. We will work on making that referral.

## 2016-03-27 ENCOUNTER — Ambulatory Visit (HOSPITAL_COMMUNITY): Payer: 59 | Admitting: Hematology & Oncology

## 2016-03-30 ENCOUNTER — Other Ambulatory Visit (HOSPITAL_COMMUNITY)
Admission: RE | Admit: 2016-03-30 | Discharge: 2016-03-30 | Disposition: A | Discharge status: Home / Self Care | Payer: 59 | Source: Ambulatory Visit | Attending: Family | Admitting: Family

## 2016-03-30 DIAGNOSIS — C9000 Multiple myeloma not having achieved remission: Secondary | ICD-10-CM | POA: Diagnosis not present

## 2016-03-30 LAB — CBC WITH DIFFERENTIAL/PLATELET
BASOS ABS: 0 10*3/uL (ref 0.0–0.1)
BASOS PCT: 0 %
Eosinophils Absolute: 0.2 10*3/uL (ref 0.0–0.7)
Eosinophils Relative: 3 %
HEMATOCRIT: 41.9 % (ref 36.0–46.0)
HEMOGLOBIN: 14.4 g/dL (ref 12.0–15.0)
LYMPHS PCT: 33 %
Lymphs Abs: 1.5 10*3/uL (ref 0.7–4.0)
MCH: 33.3 pg (ref 26.0–34.0)
MCHC: 34.4 g/dL (ref 30.0–36.0)
MCV: 96.8 fL (ref 78.0–100.0)
Monocytes Absolute: 0.4 10*3/uL (ref 0.1–1.0)
Monocytes Relative: 8 %
NEUTROS ABS: 2.5 10*3/uL (ref 1.7–7.7)
NEUTROS PCT: 55 %
Platelets: 223 10*3/uL (ref 150–400)
RBC: 4.33 MIL/uL (ref 3.87–5.11)
RDW: 12.7 % (ref 11.5–15.5)
WBC: 4.6 10*3/uL (ref 4.0–10.5)

## 2016-03-30 LAB — COMPREHENSIVE METABOLIC PANEL
ALBUMIN: 4.2 g/dL (ref 3.5–5.0)
ALT: 23 U/L (ref 14–54)
AST: 24 U/L (ref 15–41)
Alkaline Phosphatase: 52 U/L (ref 38–126)
Anion gap: 6 (ref 5–15)
BILIRUBIN TOTAL: 0.9 mg/dL (ref 0.3–1.2)
BUN: 18 mg/dL (ref 6–20)
CO2: 30 mmol/L (ref 22–32)
CREATININE: 0.97 mg/dL (ref 0.44–1.00)
Calcium: 9.6 mg/dL (ref 8.9–10.3)
Chloride: 102 mmol/L (ref 101–111)
GFR calc Af Amer: >60 mL/min (ref >60)
GLUCOSE: 93 mg/dL (ref 65–99)
POTASSIUM: 4.1 mmol/L (ref 3.5–5.1)
Sodium: 138 mmol/L (ref 135–145)
TOTAL PROTEIN: 7.3 g/dL (ref 6.5–8.1)

## 2016-03-31 LAB — KAPPA/LAMBDA LIGHT CHAINS
KAPPA FREE LGHT CHN: 13.9 mg/L (ref 3.3–19.4)
KAPPA, LAMDA LIGHT CHAIN RATIO: 1.1 (ref 0.26–1.65)
Lambda free light chains: 12.6 mg/L (ref 5.7–26.3)

## 2016-03-31 LAB — IMMUNOGLOBULINS A/E/G/M, SERUM
IGA: 191 mg/dL (ref 87–352)
IGG (IMMUNOGLOBIN G), SERUM: 1048 mg/dL (ref 700–1600)
IgE (Immunoglobulin E), Serum: 1019 IU/mL — ABNORMAL HIGH (ref 0–100)
IgM, Serum: 53 mg/dL (ref 26–217)

## 2016-04-01 LAB — PROTEIN ELECTROPHORESIS, SERUM
A/G RATIO SPE: 1.3 (ref 0.7–1.7)
ALPHA-2-GLOBULIN: 0.8 g/dL (ref 0.4–1.0)
Albumin ELP: 3.9 g/dL (ref 2.9–4.4)
Alpha-1-Globulin: 0.2 g/dL (ref 0.0–0.4)
BETA GLOBULIN: 1 g/dL (ref 0.7–1.3)
GAMMA GLOBULIN: 1.1 g/dL (ref 0.4–1.8)
Globulin, Total: 3.1 g/dL (ref 2.2–3.9)
Total Protein ELP: 7 g/dL (ref 6.0–8.5)

## 2016-04-01 LAB — IMMUNOFIXATION ELECTROPHORESIS
IgA: 200 mg/dL (ref 87–352)
IgG (Immunoglobin G), Serum: 986 mg/dL (ref 700–1600)
IgM, Serum: 51 mg/dL (ref 26–217)
Total Protein ELP: 6.8 g/dL (ref 6.0–8.5)

## 2016-04-08 DIAGNOSIS — C9001 Multiple myeloma in remission: Secondary | ICD-10-CM | POA: Diagnosis not present

## 2016-04-08 MED FILL — CLINDAMYCIN HCL 150 MG CAP: 150 | 7 days supply | Qty: 21 | Fill #0

## 2016-04-23 MED FILL — AMOX TR-K CLV 875-125 MG TA: 875-125 | 7 days supply | Qty: 14 | Fill #0

## 2016-06-15 MED FILL — LEVOTHYROXINE 75 MCG TABLET: 75 | 90 days supply | Qty: 90 | Fill #1

## 2016-06-18 MED FILL — AMOX TR-K CLV 875-125 MG TA: 875-125 | 10 days supply | Qty: 20 | Fill #0

## 2016-07-14 DIAGNOSIS — H524 Presbyopia: Secondary | ICD-10-CM | POA: Diagnosis not present

## 2016-07-14 DIAGNOSIS — H26491 Other secondary cataract, right eye: Secondary | ICD-10-CM | POA: Diagnosis not present

## 2016-07-14 DIAGNOSIS — Z961 Presence of intraocular lens: Secondary | ICD-10-CM | POA: Diagnosis not present

## 2016-07-14 DIAGNOSIS — H26492 Other secondary cataract, left eye: Secondary | ICD-10-CM | POA: Diagnosis not present

## 2016-07-20 ENCOUNTER — Other Ambulatory Visit (HOSPITAL_COMMUNITY)
Admission: RE | Admit: 2016-07-20 | Discharge: 2016-07-20 | Disposition: A | Discharge status: Home / Self Care | Payer: 59 | Source: Ambulatory Visit | Attending: Family | Admitting: Family

## 2016-07-20 DIAGNOSIS — C9000 Multiple myeloma not having achieved remission: Secondary | ICD-10-CM | POA: Diagnosis not present

## 2016-07-20 LAB — COMPREHENSIVE METABOLIC PANEL
ALT: 24 U/L (ref 14–54)
AST: 21 U/L (ref 15–41)
Albumin: 4.2 g/dL (ref 3.5–5.0)
Alkaline Phosphatase: 52 U/L (ref 38–126)
Anion gap: 5 (ref 5–15)
BUN: 20 mg/dL (ref 6–20)
CHLORIDE: 102 mmol/L (ref 101–111)
CO2: 31 mmol/L (ref 22–32)
CREATININE: 0.86 mg/dL (ref 0.44–1.00)
Calcium: 9.7 mg/dL (ref 8.9–10.3)
GFR calc non Af Amer: >60 mL/min (ref >60)
Glucose, Bld: 97 mg/dL (ref 65–99)
POTASSIUM: 4.2 mmol/L (ref 3.5–5.1)
SODIUM: 138 mmol/L (ref 135–145)
Total Bilirubin: 0.6 mg/dL (ref 0.3–1.2)
Total Protein: 7.3 g/dL (ref 6.5–8.1)

## 2016-07-20 LAB — CBC WITH DIFFERENTIAL/PLATELET
BASOS ABS: 0 10*3/uL (ref 0.0–0.1)
Basophils Relative: 0 %
EOS ABS: 0.2 10*3/uL (ref 0.0–0.7)
EOS PCT: 4 %
HCT: 40.9 % (ref 36.0–46.0)
Hemoglobin: 13.9 g/dL (ref 12.0–15.0)
Lymphocytes Relative: 34 %
Lymphs Abs: 1.5 10*3/uL (ref 0.7–4.0)
MCH: 32.6 pg (ref 26.0–34.0)
MCHC: 34 g/dL (ref 30.0–36.0)
MCV: 96 fL (ref 78.0–100.0)
Monocytes Absolute: 0.4 10*3/uL (ref 0.1–1.0)
Monocytes Relative: 9 %
Neutro Abs: 2.3 10*3/uL (ref 1.7–7.7)
Neutrophils Relative %: 53 %
PLATELETS: 231 10*3/uL (ref 150–400)
RBC: 4.26 MIL/uL (ref 3.87–5.11)
RDW: 12.6 % (ref 11.5–15.5)
WBC: 4.3 10*3/uL (ref 4.0–10.5)

## 2016-07-21 ENCOUNTER — Encounter (HOSPITAL_COMMUNITY): Payer: Self-pay

## 2016-07-21 ENCOUNTER — Ambulatory Visit (HOSPITAL_COMMUNITY)
Admission: RE | Admit: 2016-07-21 | Discharge: 2016-07-21 | Disposition: A | Discharge status: Home / Self Care | Payer: 59 | Source: Ambulatory Visit | Attending: Ophthalmology | Admitting: Ophthalmology

## 2016-07-21 ENCOUNTER — Encounter (HOSPITAL_COMMUNITY)
Admission: RE | Disposition: A | Discharge status: Home / Self Care | Payer: Self-pay | Source: Ambulatory Visit | Attending: Ophthalmology

## 2016-07-21 DIAGNOSIS — H2640 Unspecified secondary cataract: Secondary | ICD-10-CM | POA: Diagnosis not present

## 2016-07-21 DIAGNOSIS — H26491 Other secondary cataract, right eye: Secondary | ICD-10-CM | POA: Diagnosis not present

## 2016-07-21 HISTORY — PX: YAG LASER APPLICATION: SHX6189

## 2016-07-21 SURGERY — TREATMENT, USING YAG LASER
Anesthesia: LOCAL | Laterality: Right

## 2016-07-21 MED ORDER — TROPICAMIDE 1 % OP SOLN
1.0000 [drp] | OPHTHALMIC | Status: AC
  Administered 2016-07-21 (×3): 1 [drp] via OPHTHALMIC

## 2016-07-21 NOTE — Op Note (Signed)
Melissa [name] T. Gershon Crane, MD  Procedure: Yag Capsulotomy  Yag Laser Self Test Completedyes. Procedure: Posterior Capsulotomy, Eye Protection Worn by Staff yes. Laser In Use Sign on Door yes.  Laser: Nd:YAG Spot Size: Fixed Burst Mode: III Power Setting: 3.4 mJ/burst Number of shots: 18 Total energy delivered: 51.54 mJ   The patient tolerated the procedure without difficulty. No complications were encountered.   The patient was discharged home with the instructions to continue all her current glaucoma medications, if any.   Patient instructed to go to office at 0100 for intraocular pressure check.  Patient verbalizes understanding of discharge instructions Yes.  .    Pre-Operative Diagnosis: After-Cataract, obscuring vision, 366.53 OD Post-Operative Diagnosis: After-Cataract, obscuring vision, 366.53 OD Date of Cataract Surgery: Unknown

## 2016-07-21 NOTE — H&P (Signed)
The patient was re examined and there is no change in the patients condition since the original H and P. 

## 2016-07-21 NOTE — Discharge Instructions (Signed)
Melissa [name]  07/21/2016     Instructions    Activity: No Restrictions.   Diet: Resume Diet you were on at home.   Pain Medication: Tylenol if Needed.   CONTACT YOUR DOCTOR IF YOU HAVE PAIN, REDNESS IN YOUR EYE, OR DECREASED VISION.   Follow-up:today with Rutherford Guys, MD.   Dr. Gershon Crane: [phone]  Dr. Iona Hansen: [phone]  Dr. Geoffry Paradise: [phone]   If you find that you cannot contact your physician, but feel that your signs and   Symptoms warrant a physician's attention, call the Emergency Room at   [phone] [phone].   Othern/a.

## 2016-07-22 ENCOUNTER — Encounter (HOSPITAL_COMMUNITY): Payer: Self-pay | Admitting: Ophthalmology

## 2016-07-22 LAB — MULTIPLE MYELOMA PANEL, SERUM
ALBUMIN SERPL ELPH-MCNC: 3.8 g/dL (ref 2.9–4.4)
ALPHA 1: 0.1 g/dL (ref 0.0–0.4)
Albumin/Glob SerPl: 1.4 (ref 0.7–1.7)
Alpha2 Glob SerPl Elph-Mcnc: 0.8 g/dL (ref 0.4–1.0)
B-Globulin SerPl Elph-Mcnc: 1 g/dL (ref 0.7–1.3)
GLOBULIN, TOTAL: 2.8 g/dL (ref 2.2–3.9)
Gamma Glob SerPl Elph-Mcnc: 0.9 g/dL (ref 0.4–1.8)
IGA: 183 mg/dL (ref 87–352)
IGM, SERUM: 46 mg/dL (ref 26–217)
IgG (Immunoglobin G), Serum: 887 mg/dL (ref 700–1600)
TOTAL PROTEIN ELP: 6.6 g/dL (ref 6.0–8.5)

## 2016-07-24 LAB — IMMUNOGLOBULINS A/E/G/M, SERUM
IGG (IMMUNOGLOBIN G), SERUM: 915 mg/dL (ref 700–1600)
IgA: 185 mg/dL (ref 87–352)
IgE (Immunoglobulin E), Serum: 734 IU/mL — ABNORMAL HIGH (ref 0–100)
IgM, Serum: 47 mg/dL (ref 26–217)

## 2016-07-30 ENCOUNTER — Other Ambulatory Visit: Payer: Self-pay | Admitting: Family Medicine

## 2016-07-30 MED ORDER — TRIAMCINOLONE ACETONIDE 0.1 % EX CREA: TOPICAL_CREAM | Freq: Two times a day (BID) | CUTANEOUS | 0 refills | Status: DC

## 2016-07-31 MED FILL — TRIAMCINOLONE 0.1% CREAM: 0.1 | 10 days supply | Qty: 30 | Fill #0

## 2016-08-04 ENCOUNTER — Encounter (HOSPITAL_COMMUNITY)
Admission: RE | Disposition: A | Discharge status: Home / Self Care | Payer: Self-pay | Source: Ambulatory Visit | Attending: Ophthalmology

## 2016-08-04 ENCOUNTER — Encounter (HOSPITAL_COMMUNITY): Payer: Self-pay

## 2016-08-04 ENCOUNTER — Ambulatory Visit (HOSPITAL_COMMUNITY)
Admission: RE | Admit: 2016-08-04 | Discharge: 2016-08-04 | Disposition: A | Discharge status: Home / Self Care | Payer: 59 | Source: Ambulatory Visit | Attending: Ophthalmology | Admitting: Ophthalmology

## 2016-08-04 DIAGNOSIS — Z91048 Other nonmedicinal substance allergy status: Secondary | ICD-10-CM | POA: Diagnosis not present

## 2016-08-04 DIAGNOSIS — Z888 Allergy status to other drugs, medicaments and biological substances status: Secondary | ICD-10-CM | POA: Insufficient documentation

## 2016-08-04 DIAGNOSIS — Z79899 Other long term (current) drug therapy: Secondary | ICD-10-CM | POA: Diagnosis not present

## 2016-08-04 DIAGNOSIS — Z885 Allergy status to narcotic agent status: Secondary | ICD-10-CM | POA: Diagnosis not present

## 2016-08-04 DIAGNOSIS — H2640 Unspecified secondary cataract: Secondary | ICD-10-CM | POA: Diagnosis not present

## 2016-08-04 DIAGNOSIS — H26492 Other secondary cataract, left eye: Secondary | ICD-10-CM | POA: Diagnosis not present

## 2016-08-04 HISTORY — PX: YAG LASER APPLICATION: SHX6189

## 2016-08-04 SURGERY — TREATMENT, USING YAG LASER
Anesthesia: LOCAL | Laterality: Left

## 2016-08-04 MED ORDER — TROPICAMIDE 1 % OP SOLN
1.0000 [drp] | OPHTHALMIC | Status: AC
  Administered 2016-08-04 (×3): 1 [drp] via OPHTHALMIC

## 2016-08-04 MED ORDER — TETRACAINE HCL 0.5 % OP SOLN
OPHTHALMIC | Status: AC
  Filled 2016-08-04: qty 4

## 2016-08-04 MED ORDER — APRACLONIDINE HCL 1 % OP SOLN
OPHTHALMIC | Status: AC
  Filled 2016-08-04: qty 0.1

## 2016-08-04 MED ORDER — TROPICAMIDE 1 % OP SOLN
OPHTHALMIC | Status: AC
  Filled 2016-08-04: qty 3

## 2016-08-04 NOTE — Op Note (Signed)
Kasidy Gianino T. Gershon Crane, MD  Procedure: Yag Capsulotomy  Yag Laser Self Test Completedyes. Procedure: Posterior Capsulotomy, Eye Protection Worn by Staff yes. Laser In Use Sign on Door yes.  Laser: Nd:YAG Spot Size: Fixed Burst Mode: III Power Setting: 3.4 mJ/burst Number of shots: 25 Total energy delivered: 79.16 mJ   The patient tolerated the procedure without difficulty. No complications were encountered.   The patient was discharged home with the instructions to continue all her current glaucoma medications, if any.   Patient instructed to go to office at 0100 for intraocular pressure check.  Patient verbalizes understanding of discharge instructions Yes.  .    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 Gershon Crane, MD  Procedure: Yag Capsulotomy  Yag Laser Self Test Completedyes. Procedure: Posterior Capsulotomy, Eye Protection Worn by Staff yes. Laser In Use Sign on Door yes.  Laser: Nd:YAG Spot Size: Fixed Burst Mode: III Power Setting: 3.4 mJ/burst Number of shots: 25 Total energy delivered: 79.16 mJ   The patient tolerated the procedure without difficulty. No complications were encountered.   The patient was discharged home with the instructions to continue all her current glaucoma medications, if any.   Patient instructed to go to office at 0100 for intraocular pressure check.  Patient verbalizes understanding of discharge instructions Yes.  .    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

## 2016-08-04 NOTE — Discharge Instructions (Signed)
Melissa [name]  08/04/2016     Instructions    Activity: No Restrictions.   Diet: Resume Diet you were on at home.   Pain Medication: Tylenol if Needed.   CONTACT YOUR DOCTOR IF YOU HAVE PAIN, REDNESS IN YOUR EYE, OR DECREASED VISION.   Followup with Rutherford Guys, MD.   Dr. Gershon Crane: [phone]  Dr. Iona Hansen: [phone]  Dr. Geoffry Paradise: [phone]   If you find that you cannot contact your physician, but feel that your signs and   Symptoms warrant a physician's attention, call the Emergency Room at   [phone] ext.[phone].   Other.

## 2016-08-04 NOTE — H&P (Signed)
The patient was re examined and there is no change in the patients condition since the original H and P. 

## 2016-08-05 ENCOUNTER — Encounter (HOSPITAL_COMMUNITY): Payer: Self-pay | Admitting: Ophthalmology

## 2016-09-04 DIAGNOSIS — Z9484 Stem cells transplant status: Secondary | ICD-10-CM | POA: Diagnosis not present

## 2016-09-04 DIAGNOSIS — C9001 Multiple myeloma in remission: Secondary | ICD-10-CM | POA: Diagnosis not present

## 2016-09-22 MED FILL — LEVOTHYROXINE 75 MCG TABLET: 75 | 90 days supply | Qty: 90 | Fill #2

## 2016-11-09 ENCOUNTER — Ambulatory Visit (INDEPENDENT_AMBULATORY_CARE_PROVIDER_SITE_OTHER): Payer: 59 | Admitting: Family Medicine

## 2016-11-09 ENCOUNTER — Encounter: Payer: Self-pay | Admitting: Family Medicine

## 2016-11-09 ENCOUNTER — Telehealth: Payer: Self-pay | Admitting: Adult Health

## 2016-11-09 VITALS — BP 124/74 | Temp 98.8°F | Wt 145.5 lb

## 2016-11-09 DIAGNOSIS — R309 Painful micturition, unspecified: Secondary | ICD-10-CM | POA: Diagnosis not present

## 2016-11-09 LAB — POCT URINALYSIS DIPSTICK
RBC UA: NEGATIVE
Spec Grav, UA: 1.015 (ref 1.010–1.025)
pH, UA: 7 (ref 5.0–8.0)

## 2016-11-09 MED ORDER — CIPROFLOXACIN HCL 250 MG PO TABS: 250.0000 mg | ORAL_TABLET | Freq: Two times a day (BID) | ORAL | 0 refills | Status: DC

## 2016-11-09 NOTE — Telephone Encounter (Signed)
LMOVM that per Anderson Malta she would need an appointment since we have not seen her in 3 years. Advised to call back to make appointment.

## 2016-11-09 NOTE — Telephone Encounter (Signed)
Patient states she is experiencing urinary frequency, burning and urgency. She would like to come and leave a urine sample. Please advise.

## 2016-11-09 NOTE — Progress Notes (Signed)
   Subjective:    Patient ID: Melissa [name], female    DOB: 1955-05-29, 61 y.o.   MRN: [id number]  Urinary Tract Infection   This is a new problem. The current episode started in the past 7 days. The quality of the pain is described as burning. She has tried nothing for the symptoms.   Pos dysurina and incr frequency  Pos  Was on a kayak trip    Yes, held off, progresively got worse during the wk, rank a lot of stuf  Felt b  No systemic symptoms   incr fluid intake   Yrs sicne bladder infxn  Bone marrow transplant  Patient states no other concerns this visit.  Results for orders placed or performed in visit on 11/09/16  POCT urinalysis dipstick  Result Value Ref Range   Color, UA Yellow    Clarity, UA clear    Glucose, UA     Bilirubin, UA     Ketones, UA     Spec Grav, UA 1.015 1.010 - 1.025   Blood, UA Negative    pH, UA 7.0 5.0 - 8.0   Protein, UA     Urobilinogen, UA  0.2 or 1.0 E.U./dL   Nitrite, UA     Leukocytes, UA Large (3+) (A) Negative     Review of Systems No headache, no major weight loss or weight gain, no chest pain no back pain abdominal pain no change in bowel habits complete ROS otherwise negative     Objective:   Physical Exam Alert vitals stable, NAD. Blood pressure good on repeat. HEENT normal. Lungs clear. Heart regular rate and rhythm.  Urinalysis 2-4 white blood cells per high-power field       Assessment & Plan:  Impression urinary tract infection plan antibiotics prescribed symptom care discussed

## 2016-11-16 DIAGNOSIS — M720 Palmar fascial fibromatosis [Dupuytren]: Secondary | ICD-10-CM | POA: Insufficient documentation

## 2016-12-07 DIAGNOSIS — M720 Palmar fascial fibromatosis [Dupuytren]: Secondary | ICD-10-CM | POA: Diagnosis not present

## 2016-12-08 ENCOUNTER — Other Ambulatory Visit: Payer: Self-pay | Admitting: Orthopedic Surgery

## 2016-12-18 MED FILL — LEVOTHYROXINE 75 MCG TABLET: 75 | 90 days supply | Qty: 90 | Fill #3

## 2017-02-15 ENCOUNTER — Other Ambulatory Visit: Payer: Self-pay | Admitting: Family Medicine

## 2017-02-15 DIAGNOSIS — Z1231 Encounter for screening mammogram for malignant neoplasm of breast: Secondary | ICD-10-CM

## 2017-02-17 ENCOUNTER — Encounter (HOSPITAL_COMMUNITY): Payer: Self-pay

## 2017-02-17 ENCOUNTER — Ambulatory Visit (HOSPITAL_COMMUNITY)
Admission: RE | Admit: 2017-02-17 | Discharge: 2017-02-17 | Disposition: A | Discharge status: Home / Self Care | Payer: 59 | Source: Ambulatory Visit | Attending: Family Medicine | Admitting: Family Medicine

## 2017-02-17 ENCOUNTER — Ambulatory Visit (HOSPITAL_COMMUNITY): Payer: 59

## 2017-02-17 DIAGNOSIS — Z1231 Encounter for screening mammogram for malignant neoplasm of breast: Secondary | ICD-10-CM | POA: Insufficient documentation

## 2017-02-23 ENCOUNTER — Ambulatory Visit (INDEPENDENT_AMBULATORY_CARE_PROVIDER_SITE_OTHER): Payer: 59 | Admitting: Family Medicine

## 2017-02-23 ENCOUNTER — Encounter (HOSPITAL_BASED_OUTPATIENT_CLINIC_OR_DEPARTMENT_OTHER): Payer: Self-pay | Admitting: *Deleted

## 2017-02-23 VITALS — BP 122/84 | Ht 62.0 in | Wt 145.6 lb

## 2017-02-23 DIAGNOSIS — E039 Hypothyroidism, unspecified: Secondary | ICD-10-CM

## 2017-02-23 DIAGNOSIS — Z13220 Encounter for screening for lipoid disorders: Secondary | ICD-10-CM | POA: Diagnosis not present

## 2017-02-23 MED ORDER — LEVOTHYROXINE SODIUM 75 MCG PO TABS: 75.0000 ug | ORAL_TABLET | Freq: Every day | ORAL | 3 refills | Status: DC

## 2017-02-23 NOTE — Progress Notes (Signed)
   Subjective:    Patient ID: Melissa [name], female    DOB: [birth year], 61 y.o.   MRN: [id number]  HPI  Patient arrives for a follow up on hypothyroidism. No problems or concerns. Patient is doing well she is watching her diet She is taking her medicines as directed She does go to the [place] [facility] for follow-up of multiple myeloma Not having her preventative health elsewhere States her thyroid issue doing well on medicine Review of Systems    Denies fever sweats chills weight gain chest pain shortness of breath Objective:   Physical Exam Neck no masses lungs clear no crackles heart is regular pulse normal BP good neck no thyroid enlargement   No depression    Assessment & Plan:  Thyroid overall doing good job taking medication continue medicine check lab work await results  Mild hyperlipidemia in the past recheck lipid profile  Patient gets her wellness through gynecology  Patient does follow-up visits with oncology on a regular basis

## 2017-03-02 ENCOUNTER — Other Ambulatory Visit: Payer: Self-pay

## 2017-03-02 ENCOUNTER — Ambulatory Visit (HOSPITAL_BASED_OUTPATIENT_CLINIC_OR_DEPARTMENT_OTHER): Payer: 59 | Admitting: Certified Registered"

## 2017-03-02 ENCOUNTER — Encounter (HOSPITAL_BASED_OUTPATIENT_CLINIC_OR_DEPARTMENT_OTHER)
Admission: RE | Disposition: A | Discharge status: Home / Self Care | Payer: Self-pay | Source: Ambulatory Visit | Attending: Orthopedic Surgery

## 2017-03-02 ENCOUNTER — Ambulatory Visit (HOSPITAL_BASED_OUTPATIENT_CLINIC_OR_DEPARTMENT_OTHER)
Admission: RE | Admit: 2017-03-02 | Discharge: 2017-03-02 | Disposition: A | Discharge status: Home / Self Care | Payer: 59 | Source: Ambulatory Visit | Attending: Orthopedic Surgery | Admitting: Orthopedic Surgery

## 2017-03-02 ENCOUNTER — Encounter (HOSPITAL_BASED_OUTPATIENT_CLINIC_OR_DEPARTMENT_OTHER): Payer: Self-pay | Admitting: Certified Registered"

## 2017-03-02 DIAGNOSIS — Z79899 Other long term (current) drug therapy: Secondary | ICD-10-CM | POA: Diagnosis not present

## 2017-03-02 DIAGNOSIS — E039 Hypothyroidism, unspecified: Secondary | ICD-10-CM | POA: Insufficient documentation

## 2017-03-02 DIAGNOSIS — Z833 Family history of diabetes mellitus: Secondary | ICD-10-CM | POA: Insufficient documentation

## 2017-03-02 DIAGNOSIS — Z8249 Family history of ischemic heart disease and other diseases of the circulatory system: Secondary | ICD-10-CM | POA: Diagnosis not present

## 2017-03-02 DIAGNOSIS — Z832 Family history of diseases of the blood and blood-forming organs and certain disorders involving the immune mechanism: Secondary | ICD-10-CM | POA: Insufficient documentation

## 2017-03-02 DIAGNOSIS — Z85820 Personal history of malignant melanoma of skin: Secondary | ICD-10-CM | POA: Insufficient documentation

## 2017-03-02 DIAGNOSIS — Z9484 Stem cells transplant status: Secondary | ICD-10-CM | POA: Insufficient documentation

## 2017-03-02 DIAGNOSIS — Z808 Family history of malignant neoplasm of other organs or systems: Secondary | ICD-10-CM | POA: Insufficient documentation

## 2017-03-02 DIAGNOSIS — G629 Polyneuropathy, unspecified: Secondary | ICD-10-CM | POA: Insufficient documentation

## 2017-03-02 DIAGNOSIS — M720 Palmar fascial fibromatosis [Dupuytren]: Secondary | ICD-10-CM | POA: Insufficient documentation

## 2017-03-02 DIAGNOSIS — C9000 Multiple myeloma not having achieved remission: Secondary | ICD-10-CM | POA: Diagnosis not present

## 2017-03-02 HISTORY — PX: FASCIECTOMY: SHX6525

## 2017-03-02 HISTORY — DX: Stem cells transplant status: Z94.84

## 2017-03-02 HISTORY — DX: Other seasonal allergic rhinitis: J30.2

## 2017-03-02 LAB — POCT HEMOGLOBIN-HEMACUE: HEMOGLOBIN: 15 g/dL (ref 12.0–15.0)

## 2017-03-02 SURGERY — FASCIECTOMY, PALM
Anesthesia: Monitor Anesthesia Care | Site: Finger | Laterality: Left

## 2017-03-02 MED ORDER — LIDOCAINE HCL (PF) 1 % IJ SOLN
INTRAMUSCULAR | Status: AC
  Filled 2017-03-02: qty 5

## 2017-03-02 MED ORDER — MIDAZOLAM HCL 2 MG/2ML IJ SOLN
INTRAMUSCULAR | Status: AC
  Filled 2017-03-02: qty 2

## 2017-03-02 MED ORDER — FENTANYL CITRATE (PF) 100 MCG/2ML IJ SOLN: 25.0000 ug | INTRAMUSCULAR | Status: DC | PRN

## 2017-03-02 MED ORDER — HYDROCODONE-ACETAMINOPHEN 5-325 MG PO TABS: 1.0000 | ORAL_TABLET | Freq: Four times a day (QID) | ORAL | 0 refills | Status: DC | PRN

## 2017-03-02 MED ORDER — FENTANYL CITRATE (PF) 100 MCG/2ML IJ SOLN: 50.0000 ug | INTRAMUSCULAR | Status: DC | PRN

## 2017-03-02 MED ORDER — MIDAZOLAM HCL 2 MG/2ML IJ SOLN
1.0000 mg | INTRAMUSCULAR | Status: DC | PRN
  Administered 2017-03-02: 1 mg via INTRAVENOUS

## 2017-03-02 MED ORDER — ROPIVACAINE HCL 5 MG/ML IJ SOLN
INTRAMUSCULAR | Status: DC | PRN
  Administered 2017-03-02: 30 mL via PERINEURAL

## 2017-03-02 MED ORDER — ONDANSETRON HCL 4 MG/2ML IJ SOLN
INTRAMUSCULAR | Status: DC | PRN
  Administered 2017-03-02: 4 mg via INTRAVENOUS

## 2017-03-02 MED ORDER — PROPOFOL 500 MG/50ML IV EMUL
INTRAVENOUS | Status: DC | PRN
  Administered 2017-03-02: 50 ug/kg/min via INTRAVENOUS

## 2017-03-02 MED ORDER — CHLORHEXIDINE GLUCONATE 4 % EX LIQD: 60.0000 mL | Freq: Once | CUTANEOUS | Status: DC

## 2017-03-02 MED ORDER — LACTATED RINGERS IV SOLN
INTRAVENOUS | Status: DC
  Administered 2017-03-02: 08:00:00 via INTRAVENOUS

## 2017-03-02 MED ORDER — LIDOCAINE HCL (CARDIAC) 20 MG/ML IV SOLN
INTRAVENOUS | Status: DC | PRN
  Administered 2017-03-02: 30 mg via INTRAVENOUS

## 2017-03-02 MED ORDER — ONDANSETRON HCL 4 MG/2ML IJ SOLN: 4.0000 mg | Freq: Once | INTRAMUSCULAR | Status: DC | PRN

## 2017-03-02 MED ORDER — THROMBIN (RECOMBINANT) 20000 UNITS EX SOLR
CUTANEOUS | Status: DC | PRN
  Administered 2017-03-02: 5000 [IU] via TOPICAL

## 2017-03-02 MED ORDER — THROMBIN 5000 UNITS EX SOLR
CUTANEOUS | Status: AC
  Filled 2017-03-02: qty 5000

## 2017-03-02 MED ORDER — FENTANYL CITRATE (PF) 100 MCG/2ML IJ SOLN
INTRAMUSCULAR | Status: AC
  Filled 2017-03-02: qty 2

## 2017-03-02 MED ORDER — CEFAZOLIN SODIUM-DEXTROSE 2-4 GM/100ML-% IV SOLN
INTRAVENOUS | Status: AC
  Filled 2017-03-02: qty 100

## 2017-03-02 MED ORDER — 0.9 % SODIUM CHLORIDE (POUR BTL) OPTIME
TOPICAL | Status: DC | PRN
  Administered 2017-03-02: 100 mL

## 2017-03-02 MED ORDER — SCOPOLAMINE 1 MG/3DAYS TD PT72: 1.0000 | MEDICATED_PATCH | Freq: Once | TRANSDERMAL | Status: DC | PRN

## 2017-03-02 MED ORDER — BUPIVACAINE HCL (PF) 0.25 % IJ SOLN
INTRAMUSCULAR | Status: AC
  Filled 2017-03-02: qty 30

## 2017-03-02 MED ORDER — BUPIVACAINE HCL (PF) 0.25 % IJ SOLN
INTRAMUSCULAR | Status: DC | PRN
  Administered 2017-03-02: 8 mL

## 2017-03-02 MED ORDER — CEFAZOLIN SODIUM-DEXTROSE 2-4 GM/100ML-% IV SOLN
2.0000 g | INTRAVENOUS | Status: AC
  Administered 2017-03-02: 2 g via INTRAVENOUS

## 2017-03-02 SURGICAL SUPPLY — 43 items
BLADE MINI RND TIP GREEN BEAV (BLADE) ×2 IMPLANT
BLADE SURG 15 STRL LF DISP TIS (BLADE) ×1 IMPLANT
BLADE SURG 15 STRL SS (BLADE) ×2
BNDG CMPR 9X4 STRL LF SNTH (GAUZE/BANDAGES/DRESSINGS) ×1
BNDG COHESIVE 3X5 TAN STRL LF (GAUZE/BANDAGES/DRESSINGS) ×2 IMPLANT
BNDG ESMARK 4X9 LF (GAUZE/BANDAGES/DRESSINGS) ×2 IMPLANT
BNDG GAUZE ELAST 4 BULKY (GAUZE/BANDAGES/DRESSINGS) ×2 IMPLANT
CHLORAPREP W/TINT 26ML (MISCELLANEOUS) ×2 IMPLANT
CORD BIPOLAR FORCEPS 12FT (ELECTRODE) ×2 IMPLANT
COVER BACK TABLE 60X90IN (DRAPES) ×2 IMPLANT
COVER MAYO STAND STRL (DRAPES) ×2 IMPLANT
CUFF TOURNIQUET SINGLE 18IN (TOURNIQUET CUFF) IMPLANT
DECANTER SPIKE VIAL GLASS SM (MISCELLANEOUS) IMPLANT
DRAPE EXTREMITY T 121X128X90 (DRAPE) ×2 IMPLANT
DRAPE SURG 17X23 STRL (DRAPES) ×2 IMPLANT
GAUZE SPONGE 4X4 12PLY STRL (GAUZE/BANDAGES/DRESSINGS) ×2 IMPLANT
GAUZE XEROFORM 1X8 LF (GAUZE/BANDAGES/DRESSINGS) ×2 IMPLANT
GLOVE BIO SURGEON STRL SZ 6.5 (GLOVE) ×2 IMPLANT
GLOVE BIOGEL PI IND STRL 7.0 (GLOVE) ×1 IMPLANT
GLOVE BIOGEL PI IND STRL 8.5 (GLOVE) ×1 IMPLANT
GLOVE BIOGEL PI INDICATOR 7.0 (GLOVE) ×1
GLOVE BIOGEL PI INDICATOR 8.5 (GLOVE) ×1
GLOVE SURG ORTHO 8.0 STRL STRW (GLOVE) ×2 IMPLANT
GOWN STRL REUS W/ TWL LRG LVL3 (GOWN DISPOSABLE) ×1 IMPLANT
GOWN STRL REUS W/TWL LRG LVL3 (GOWN DISPOSABLE) ×2
GOWN STRL REUS W/TWL XL LVL3 (GOWN DISPOSABLE) ×2 IMPLANT
LOOP VESSEL MAXI BLUE (MISCELLANEOUS) ×2 IMPLANT
NEEDLE PRECISIONGLIDE 27X1.5 (NEEDLE) ×2 IMPLANT
NS IRRIG 1000ML POUR BTL (IV SOLUTION) ×2 IMPLANT
PACK BASIN DAY SURGERY FS (CUSTOM PROCEDURE TRAY) ×2 IMPLANT
PAD CAST 3X4 CTTN HI CHSV (CAST SUPPLIES) ×1 IMPLANT
PADDING CAST COTTON 3X4 STRL (CAST SUPPLIES) ×2
SLEEVE SCD COMPRESS KNEE MED (MISCELLANEOUS) ×2 IMPLANT
SLING ARM FOAM STRAP MED (SOFTGOODS) ×2 IMPLANT
SPLINT PLASTER CAST XFAST 3X15 (CAST SUPPLIES) ×8 IMPLANT
SPLINT PLASTER XTRA FASTSET 3X (CAST SUPPLIES) ×8
STOCKINETTE 4X48 STRL (DRAPES) ×2 IMPLANT
SUT ETHILON 4 0 PS 2 18 (SUTURE) ×4 IMPLANT
SUT SILK 2 0 PERMA HAND 18 BK (SUTURE) ×2 IMPLANT
SYR BULB 3OZ (MISCELLANEOUS) ×2 IMPLANT
SYR CONTROL 10ML LL (SYRINGE) ×2 IMPLANT
TOWEL OR 17X24 6PK STRL BLUE (TOWEL DISPOSABLE) ×4 IMPLANT
UNDERPAD 30X30 (UNDERPADS AND DIAPERS) ×2 IMPLANT

## 2017-03-02 NOTE — Discharge Instructions (Addendum)

## 2017-03-02 NOTE — Op Note (Signed)
Dictation Number [id number]

## 2017-03-02 NOTE — H&P (Signed)
Melissa [name] is an 61 y.o. female.   Chief Complaint:contracture left small finger HPI: Melissa [name] is a 61 year old right-hand-dominant female who comes in with a complaint of contractures in her left hand. These have been present for 8 years. She knows that these are Dupuytren's in nature. She is not scanned [place] descent. Her mother had deep trans-cords and contractures she thinks her grandmother also did. She has siblings but does not know if any of these have had problems. She has lumps on her feet. She has no history of complaints of pain or discomfort no known no numbness and tingling. She has had problems with her neck and sees Dr. Sherwood Gambler for this. She has a history of thyroid problems she has no history of diabetes arthritis or gout. Family history is positive diabetes negative for thyroid problems arthritis and gout. She has a history of multiple myeloma and his stem cell transplant.        Past Medical History:  Diagnosis Date  . History of stem cell transplant ([place])    for multiple myeloma  . Hypothyroidism   . Multiple myeloma   . Peripheral neuropathy   . Seasonal allergies   . Thyroid disease     Past Surgical History:  Procedure Laterality Date  . CESAREAN SECTION  06/1993  . TONSILECTOMY, ADENOIDECTOMY, BILATERAL MYRINGOTOMY AND TUBES    . TONSILLECTOMY    . TUBAL LIGATION      Family History  Problem Relation Age of Onset  . Hypertension Mother   . Heart attack Mother   . Diabetes Mother   . Skin cancer Mother   . Hypertension Father   . Heart attack Father   . Skin cancer Father   . Heart disease Maternal Grandmother   . Stroke Maternal Grandfather   . Hypertension Sister   . Skin cancer Sister   . Hypertension Brother   . Skin cancer Brother   . Hypertension Brother   . Cancer Brother        melanoma  . Skin cancer Brother   . Factor V Leiden deficiency Daughter    Social History:  reports that  has never smoked. she has never used smokeless  tobacco. She reports that she drinks alcohol. She reports that she does not use drugs.  Allergies:  Allergies  Allergen Reactions  . Hydromorphone Hcl Nausea And Vomiting  . Poison Ivy Extract [Poison Ivy Extract]   . Tape Rash    Medications Prior to Admission  Medication Sig Dispense Refill  . Calcium Carbonate-Vitamin D (CALCIUM + D PO) Take 1 tablet by mouth daily.    . Cholecalciferol (VITAMIN D) 2000 UNITS tablet Take 2,000 Units by mouth daily.    Marland Kitchen ibuprofen (ADVIL,MOTRIN) 200 MG tablet Take 200 mg by mouth as needed.    Marland Kitchen levothyroxine (SYNTHROID, LEVOTHROID) 75 MCG tablet Take 1 tablet (75 mcg total) by mouth daily. 90 tablet 3  . loratadine (CLARITIN) 10 MG tablet Take 10 mg by mouth daily.    Marland Kitchen triamcinolone cream (KENALOG) 0.1 % Apply topically 2 (two) times daily. 30 g 0    Results for orders placed or performed during the hospital encounter of 03/02/17 (from the past 48 hour(s))  Hemoglobin-hemacue, POC     Status: None   Collection Time: 03/02/17  7:48 AM  Result Value Ref Range   Hemoglobin 15.0 12.0 - 15.0 g/dL    No results found.   Pertinent items are noted in HPI.  Blood pressure 134/69,  pulse 81, temperature 98.2 F (36.8 C), temperature source Oral, resp. rate 18, height 5' 1" (1.549 m), weight 66.7 kg (147 lb), SpO2 100 %.  General appearance: alert, cooperative and appears stated age Head: Normocephalic, without obvious abnormality Neck: no JVD Resp: clear to auscultation bilaterally Cardio: regular rate and rhythm, S1, S2 normal, no murmur, click, rub or gallop GI: soft, non-tender; bowel sounds normal; no masses,  no organomegaly Extremities: contracture left small finger Pulses: 2+ and symmetric Skin: Skin color, texture, turgor normal. No rashes or lesions Neurologic: Grossly normal Incision/Wound: na  Assessment/Plan Assessment:  1. 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  Pre-peri-and postoperative course were discussed with her. She is where there is no guarantee to the surgery the possibility of infection current surgery to arteries and understands complete release symptoms dystrophy. Scheduled as an outpatient under regional anesthesia. Questions are encouraged and answered to her satisfaction. She is aware of the possibility of recurrence possibility of dystrophy possibility of loss of the finger.      , R 03/02/2017, 8:26 AM

## 2017-03-02 NOTE — Brief Op Note (Signed)
03/02/2017  9:31 AM  PATIENT:  Melissa [name]  61 y.o. 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  PROCEDURE:  Procedure(s): FASCIECTOMY, LEFT SMALL FINGER (Left)  SURGEON:  Surgeon(s) and Role:    Daryll Brod, MD - Primary  PHYSICIAN ASSISTANT:   ASSISTANTS: none   ANESTHESIA:   local, regional and IV sedation  EBL:  none   BLOOD ADMINISTERED:none  DRAINS: vessel loop   LOCAL MEDICATIONS USED:  BUPIVICAINE   SPECIMEN:  Excision  DISPOSITION OF SPECIMEN:  PATHOLOGY  COUNTS:  YES  TOURNIQUET:   Total Tourniquet Time Documented: Upper Arm (Left) - 37 minutes Total: Upper Arm (Left) - 37 minutes   DICTATION: .Other Dictation: Dictation Number [id number]  PLAN OF CARE: Discharge to home after PACU  PATIENT DISPOSITION:  PACU - hemodynamically stable.

## 2017-03-02 NOTE — Anesthesia Procedure Notes (Signed)
Procedure Name: MAC Date/Time: 03/02/2017 8:35 AM Performed by: Signe Colt, CRNA Pre-anesthesia Checklist: Patient identified, Emergency Drugs available, Suction available, Patient being monitored and Timeout performed Patient Re-evaluated:Patient Re-evaluated prior to induction Oxygen Delivery Method: Simple face mask

## 2017-03-02 NOTE — Transfer of Care (Signed)
Immediate Anesthesia Transfer of Care Note  Patient: Melissa [name]  Procedure(s) Performed: FASCIECTOMY, LEFT SMALL FINGER (Left Finger)  Patient Location: PACU  Anesthesia Type:MAC combined with regional for post-op pain  Level of Consciousness: awake, alert , oriented and patient cooperative  Airway & Oxygen Therapy: Patient Spontanous Breathing and Patient connected to face mask oxygen  Post-op Assessment: Report given to RN and Post -op Vital signs reviewed and stable  Post vital signs: Reviewed and stable  Last Vitals:  Vitals:   03/02/17 0825 03/02/17 0830  BP:  (!) 141/86  Pulse: 82 85  Resp: 20 17  Temp:    SpO2: 100% 100%    Last Pain:  Vitals:   03/02/17 0718  TempSrc: Oral         Complications: No apparent anesthesia complications

## 2017-03-02 NOTE — Anesthesia Postprocedure Evaluation (Signed)
Anesthesia Post Note  Patient: Melissa [name]  Procedure(s) Performed: FASCIECTOMY, LEFT SMALL FINGER (Left Finger)     Patient location during evaluation: PACU Anesthesia Type: Regional and MAC Level of consciousness: awake and alert Pain management: pain level controlled Vital Signs Assessment: post-procedure vital signs reviewed and stable Respiratory status: spontaneous breathing, nonlabored ventilation, respiratory function stable and patient connected to nasal cannula oxygen Cardiovascular status: stable and blood pressure returned to baseline Postop Assessment: no apparent nausea or vomiting Anesthetic complications: no    Last Vitals:  Vitals:   03/02/17 1005 03/02/17 1027  BP:  (!) 142/68  Pulse: 80 75  Resp: 15 16  Temp:  36.4 C  SpO2: 100% 100%    Last Pain:  Vitals:   03/02/17 1027  TempSrc: Oral  PainSc: 0-No pain                 Ryan P Ellender

## 2017-03-02 NOTE — Anesthesia Preprocedure Evaluation (Addendum)
Anesthesia Evaluation  Patient identified by MRN, date of birth, ID band Patient awake    Reviewed: Allergy & Precautions, NPO status , Patient's Chart, lab work & pertinent test results  Airway Mallampati: II  TM Distance: >3 FB Neck ROM: Full    Dental no notable dental hx.    Pulmonary neg pulmonary ROS,    Pulmonary exam normal breath sounds clear to auscultation       Cardiovascular negative cardio ROS Normal cardiovascular exam Rhythm:Regular Rate:Normal     Neuro/Psych Peripheral neuropathy negative psych ROS   GI/Hepatic negative GI ROS, Neg liver ROS,   Endo/Other  Hypothyroidism   Renal/GU negative Renal ROS     Musculoskeletal negative musculoskeletal ROS (+) DUPUYTREN'S LEFT SMALL FINGER    Abdominal   Peds  Hematology negative hematology ROS (+)   Anesthesia Other Findings Stem cell transplant for multiple myeloma  Reproductive/Obstetrics                                                             Anesthesia Evaluation  Patient identified by MRN, date of birth, ID band Patient awake    Reviewed: Allergy & Precautions, H&P , NPO status , Patient's Chart, lab work & pertinent test results  Airway Mallampati: III TM Distance: >3 FB     Dental  (+) Teeth Intact   Pulmonary neg pulmonary ROS,  breath sounds clear to auscultation        Cardiovascular negative cardio ROS  Rhythm:Regular Rate:Normal     Neuro/Psych  Neuromuscular disease    GI/Hepatic negative GI ROS,   Endo/Other  Hypothyroidism   Renal/GU      Musculoskeletal   Abdominal   Peds  Hematology   Anesthesia Other Findings   Reproductive/Obstetrics                           Anesthesia Physical Anesthesia Plan  ASA: II  Anesthesia Plan: MAC   Post-op Pain Management:    Induction: Intravenous  Airway Management Planned: Nasal  Cannula  Additional Equipment:   Intra-op Plan:   Post-operative Plan:   Informed Consent: I have reviewed the patients History and Physical, chart, labs and discussed the procedure including the risks, benefits and alternatives for the proposed anesthesia with the patient or authorized representative who has indicated his/her understanding and acceptance.     Plan Discussed with:   Anesthesia Plan Comments:         Anesthesia Quick Evaluation  Anesthesia Physical Anesthesia Plan  ASA: II  Anesthesia Plan: MAC and Regional and IV Regional(Bier Block-LIDOCAINE ONLY)   Post-op Pain Management:  Regional for Post-op pain   Induction: Intravenous  PONV Risk Score and Plan: 2 and Propofol infusion, Ondansetron and Dexamethasone  Airway Management Planned: Natural Airway  Additional Equipment:   Intra-op Plan:   Post-operative Plan:   Informed Consent: I have reviewed the patients History and Physical, chart, labs and discussed the procedure including the risks, benefits and alternatives for the proposed anesthesia with the patient or authorized representative who has indicated his/her understanding and acceptance.   Dental advisory given  Plan Discussed with: CRNA  Anesthesia Plan Comments:        Anesthesia Quick Evaluation

## 2017-03-02 NOTE — Progress Notes (Signed)
Assisted Dr. Ellender with left, ultrasound guided, supraclavicular block. Side rails up, monitors on throughout procedure. See vital signs in flow sheet. Tolerated Procedure well. 

## 2017-03-02 NOTE — Anesthesia Procedure Notes (Signed)
Anesthesia Regional Block: Supraclavicular block   Pre-Anesthetic Checklist: ,, timeout performed, Correct Patient, Correct Site, Correct Laterality, Correct Procedure,, site marked, risks and benefits discussed, Surgical consent,  Pre-op evaluation,  At surgeon's request and post-op pain management  Laterality: Left  Prep: chloraprep       Needles:  Injection technique: Single-shot  Needle Type: Echogenic Stimulator Needle     Needle Length: 9cm  Needle Gauge: 21     Additional Needles:   Procedures:, nerve stimulator,,, ultrasound used (permanent image in chart), intact distal pulses,,,  Narrative:  Start time: 03/02/2017 8:10 AM End time: 03/02/2017 8:20 AM Injection made incrementally with aspirations every 5 mL.  Performed by: Personally  Anesthesiologist: Murvin Natal, MD  Additional Notes: Functioning IV was confirmed and monitors were applied.  A 69mm 21ga Arrow echogenic stimulator needle was used. Sterile prep, hand hygiene and sterile gloves were used.  Negative aspiration and negative test dose prior to incremental administration of local anesthetic. The patient tolerated the procedure well.

## 2017-03-02 NOTE — Op Note (Signed)
NAMEZONDRA, Melissa [name]               ACCOUNT NO.:  [id number]  MEDICAL RECORD NO.:  [id number]  LOCATION:                                 FACILITY:  PHYSICIAN:  Daryll Brod, M.D.            DATE OF BIRTH:  DATE OF PROCEDURE:  03/02/2017 DATE OF DISCHARGE:                              OPERATIVE REPORT   PREOPERATIVE DIAGNOSIS:  Dupuytren contracture, left small finger.  POSTOPERATIVE DIAGNOSIS:  Dupuytren contracture, left small finger.  OPERATION:  Excision of palmar fascia, left small finger.  SURGEON:  Daryll Brod, MD.  ANESTHESIA:  Supraclavicular block with wrist block.  PLACE OF SURGERY:  [facility].  SURGEON:  Daryll Brod, MD.  ASSISTANT:  None.  HISTORY:  The patient is a 61 year old female with a history of Dupuytren contracture of left small finger.  She has elected to undergo surgical excision of the palmar fascia known as fasciectomy, complete in nature.  She is aware of other alternatives and has elected on excision. Pre, peri, and postoperative courses were discussed along with risks and complications.  She is aware that there is no guarantee to the surgery, the possibility of infection, recurrence of injury to arteries, nerves, and tendons, incomplete relief of symptoms, and dystrophy.  In the preoperative area, the patient is seen, the extremity marked by both patient and surgeon, antibiotic given.  DESCRIPTION OF PROCEDURE:  After a supraclavicular block was carried out in the preoperative area, she was brought to the operating room where a sedation was given.  She was prepped and draped in supine position with left arm free using ChloraPrep.  A 3-minute dry time was allowed and time-out was taken confirming the patient and procedure.  The limb was exsanguinated with an Esmarch bandage from the wrist proximally. Tourniquet placed high on the arm was inflated to 250 mmHg.  A volar Bruner incision was made over the cord to the small finger, carried  down through subcutaneous tissue.  The proximal aspect of the cord was identified at the distal margin of the flexor retinaculum.  The neurovascular bundles identified proximally.  The cord was then transected.  This was traced distally with insertion going around the A2 pulley.  Neurovascular bundles were identified radially and ulnarly and protected.  The Natatory cord going to the ring finger was excised.  An abductor digiti quinti cord was present with the artery and nerve going beneath it.  This was transected and left with the cord distally.  The dissection was carried distally again protecting neurovascular bundles radially and ulnarly out to the middle phalanx.  The cords attachment to the middle phalanx were then isolated protecting the neurovascular bundles and transected allowing complete removal of the cord.  The finger came straight except for the last approximately 10 degrees of the PIP joint.  The wound was copiously irrigated with saline.  These were converted to Wise and the skin advanced and sutured into position with interrupted 4-0 nylon sutures after irrigation of the wound, placement of thrombin, and a doubled over vessel loop drain.  A sterile compressive dressing was applied.  The tourniquet was deflated.  All fingers  pinked up.  A dorsal splint was applied.  She was taken to the recovery room for observation in satisfactory condition.  During the beginning of procedure, a wrist block was given with 0.25% bupivacaine without epinephrine; 8 mL was used.  The patient will be discharged to home to return to the [facility] in 1 week, on [place].          ______________________________ Daryll Brod, M.D.     GK/MEDQ  D:  03/02/2017  T:  03/02/2017  Job:  [id number]

## 2017-03-03 ENCOUNTER — Encounter (HOSPITAL_BASED_OUTPATIENT_CLINIC_OR_DEPARTMENT_OTHER): Payer: Self-pay | Admitting: Orthopedic Surgery

## 2017-03-08 DIAGNOSIS — M25642 Stiffness of left hand, not elsewhere classified: Secondary | ICD-10-CM | POA: Diagnosis not present

## 2017-03-08 DIAGNOSIS — M79642 Pain in left hand: Secondary | ICD-10-CM | POA: Diagnosis not present

## 2017-03-08 DIAGNOSIS — M720 Palmar fascial fibromatosis [Dupuytren]: Secondary | ICD-10-CM | POA: Diagnosis not present

## 2017-03-11 ENCOUNTER — Other Ambulatory Visit (HOSPITAL_COMMUNITY)
Admission: RE | Admit: 2017-03-11 | Discharge: 2017-03-11 | Disposition: A | Discharge status: Home / Self Care | Payer: 59 | Source: Ambulatory Visit | Attending: Family Medicine | Admitting: Family Medicine

## 2017-03-11 DIAGNOSIS — E039 Hypothyroidism, unspecified: Secondary | ICD-10-CM | POA: Insufficient documentation

## 2017-03-11 DIAGNOSIS — Z13220 Encounter for screening for lipoid disorders: Secondary | ICD-10-CM | POA: Insufficient documentation

## 2017-03-11 DIAGNOSIS — R69 Illness, unspecified: Secondary | ICD-10-CM | POA: Diagnosis not present

## 2017-03-11 LAB — CBC WITH DIFFERENTIAL/PLATELET
Basophils Absolute: 0 10*3/uL (ref 0.0–0.1)
Basophils Relative: 1 %
EOS ABS: 0.1 10*3/uL (ref 0.0–0.7)
Eosinophils Relative: 3 %
HEMATOCRIT: 42.6 % (ref 36.0–46.0)
HEMOGLOBIN: 14.8 g/dL (ref 12.0–15.0)
LYMPHS ABS: 1.4 10*3/uL (ref 0.7–4.0)
LYMPHS PCT: 35 %
MCH: 33.9 pg (ref 26.0–34.0)
MCHC: 34.7 g/dL (ref 30.0–36.0)
MCV: 97.7 fL (ref 78.0–100.0)
MONOS PCT: 8 %
Monocytes Absolute: 0.3 10*3/uL (ref 0.1–1.0)
NEUTROS ABS: 2.1 10*3/uL (ref 1.7–7.7)
NEUTROS PCT: 53 %
Platelets: 267 10*3/uL (ref 150–400)
RBC: 4.36 MIL/uL (ref 3.87–5.11)
RDW: 12.9 % (ref 11.5–15.5)
WBC: 4 10*3/uL (ref 4.0–10.5)

## 2017-03-11 LAB — COMPREHENSIVE METABOLIC PANEL
ALK PHOS: 58 U/L (ref 38–126)
ALT: 25 U/L (ref 14–54)
ANION GAP: 6 (ref 5–15)
AST: 26 U/L (ref 15–41)
Albumin: 4.2 g/dL (ref 3.5–5.0)
BUN: 18 mg/dL (ref 6–20)
CALCIUM: 9.9 mg/dL (ref 8.9–10.3)
CO2: 30 mmol/L (ref 22–32)
CREATININE: 1.01 mg/dL — AB (ref 0.44–1.00)
Chloride: 100 mmol/L — ABNORMAL LOW (ref 101–111)
GFR calc non Af Amer: 60 mL/min — ABNORMAL LOW (ref >60)
Glucose, Bld: 91 mg/dL (ref 65–99)
Potassium: 4.2 mmol/L (ref 3.5–5.1)
Sodium: 136 mmol/L (ref 135–145)
TOTAL PROTEIN: 7.3 g/dL (ref 6.5–8.1)
Total Bilirubin: 0.7 mg/dL (ref 0.3–1.2)

## 2017-03-11 LAB — T4, FREE: Free T4: 1.27 ng/dL — ABNORMAL HIGH (ref 0.61–1.12)

## 2017-03-11 LAB — LIPID PANEL
CHOLESTEROL: 195 mg/dL (ref 0–200)
HDL: 53 mg/dL (ref >40)
LDL CALC: 128 mg/dL — AB (ref 0–99)
TRIGLYCERIDES: 72 mg/dL (ref <150)
Total CHOL/HDL Ratio: 3.7 RATIO
VLDL: 14 mg/dL (ref 0–40)

## 2017-03-11 LAB — TSH: TSH: 0.869 u[IU]/mL (ref 0.350–4.500)

## 2017-03-12 ENCOUNTER — Other Ambulatory Visit: Payer: Self-pay

## 2017-03-12 ENCOUNTER — Other Ambulatory Visit: Payer: Self-pay | Admitting: Family Medicine

## 2017-03-12 ENCOUNTER — Other Ambulatory Visit (HOSPITAL_COMMUNITY)
Admission: RE | Admit: 2017-03-12 | Discharge: 2017-03-12 | Disposition: A | Discharge status: Home / Self Care | Payer: 59 | Source: Ambulatory Visit | Attending: Family Medicine | Admitting: Family Medicine

## 2017-03-12 DIAGNOSIS — E039 Hypothyroidism, unspecified: Secondary | ICD-10-CM

## 2017-03-12 LAB — IMMUNOFIXATION ELECTROPHORESIS: TOTAL PROTEIN ELP: UNDETERMINED g/dL

## 2017-03-12 LAB — PROTEIN ELECTROPHORESIS, SERUM: Total Protein ELP: UNDETERMINED g/dL

## 2017-03-12 LAB — KAPPA/LAMBDA LIGHT CHAINS: KAPPA FREE LGHT CHN: UNDETERMINED mg/L

## 2017-03-12 NOTE — Progress Notes (Signed)
fre

## 2017-03-14 LAB — IMMUNOGLOBULINS A/E/G/M, SERUM
IGA: 208 mg/dL (ref 87–352)
IgE (Immunoglobulin E), Serum: 1260 IU/mL — ABNORMAL HIGH (ref 0–100)
IgG (Immunoglobin G), Serum: 1097 mg/dL (ref 700–1600)
IgM (Immunoglobulin M), Srm: 71 mg/dL (ref 26–217)

## 2017-03-15 LAB — KAPPA/LAMBDA LIGHT CHAINS
Kappa free light chain: 14.4 mg/L (ref 3.3–19.4)
Kappa, lambda light chain ratio: 1.09 (ref 0.26–1.65)
LAMDA FREE LIGHT CHAINS: 13.2 mg/L (ref 5.7–26.3)

## 2017-03-15 LAB — PROTEIN ELECTROPHORESIS, SERUM
A/G RATIO SPE: 1.3 (ref 0.7–1.7)
ALBUMIN ELP: 4.1 g/dL (ref 2.9–4.4)
ALPHA-1-GLOBULIN: 0.2 g/dL (ref 0.0–0.4)
ALPHA-2-GLOBULIN: 0.8 g/dL (ref 0.4–1.0)
BETA GLOBULIN: 1 g/dL (ref 0.7–1.3)
GAMMA GLOBULIN: 1.1 g/dL (ref 0.4–1.8)
Globulin, Total: 3.1 g/dL (ref 2.2–3.9)
Total Protein ELP: 7.2 g/dL (ref 6.0–8.5)

## 2017-03-15 LAB — IMMUNOFIXATION ELECTROPHORESIS
IGA: 203 mg/dL (ref 87–352)
IGM (IMMUNOGLOBULIN M), SRM: 64 mg/dL (ref 26–217)
IgG (Immunoglobin G), Serum: 1081 mg/dL (ref 700–1600)
Total Protein ELP: 7.1 g/dL (ref 6.0–8.5)

## 2017-03-22 DIAGNOSIS — C9001 Multiple myeloma in remission: Secondary | ICD-10-CM | POA: Diagnosis not present

## 2017-03-22 DIAGNOSIS — Z9484 Stem cells transplant status: Secondary | ICD-10-CM | POA: Diagnosis not present

## 2017-03-26 ENCOUNTER — Other Ambulatory Visit (HOSPITAL_COMMUNITY): Payer: Self-pay | Admitting: *Deleted

## 2017-03-26 DIAGNOSIS — C9001 Multiple myeloma in remission: Secondary | ICD-10-CM

## 2017-03-29 ENCOUNTER — Other Ambulatory Visit (HOSPITAL_COMMUNITY): Payer: Self-pay | Admitting: Nurse Practitioner

## 2017-03-29 DIAGNOSIS — C9001 Multiple myeloma in remission: Secondary | ICD-10-CM

## 2017-04-05 ENCOUNTER — Ambulatory Visit (HOSPITAL_COMMUNITY)
Admission: RE | Admit: 2017-04-05 | Discharge: 2017-04-05 | Disposition: A | Discharge status: Home / Self Care | Payer: 59 | Source: Ambulatory Visit | Attending: Family Medicine | Admitting: Family Medicine

## 2017-04-05 DIAGNOSIS — M8588 Other specified disorders of bone density and structure, other site: Secondary | ICD-10-CM | POA: Diagnosis not present

## 2017-04-05 DIAGNOSIS — Z780 Asymptomatic menopausal state: Secondary | ICD-10-CM | POA: Diagnosis not present

## 2017-04-05 DIAGNOSIS — C9001 Multiple myeloma in remission: Secondary | ICD-10-CM | POA: Insufficient documentation

## 2017-04-05 MED FILL — LEVOTHYROXINE 75 MCG TABLET: 75 | 90 days supply | Qty: 90 | Fill #0

## 2017-04-07 DIAGNOSIS — R42 Dizziness and giddiness: Secondary | ICD-10-CM | POA: Diagnosis not present

## 2017-04-07 DIAGNOSIS — C9000 Multiple myeloma not having achieved remission: Secondary | ICD-10-CM | POA: Diagnosis not present

## 2017-04-07 DIAGNOSIS — I10 Essential (primary) hypertension: Secondary | ICD-10-CM | POA: Diagnosis not present

## 2017-04-07 DIAGNOSIS — R030 Elevated blood-pressure reading, without diagnosis of hypertension: Secondary | ICD-10-CM | POA: Diagnosis not present

## 2017-04-07 DIAGNOSIS — C9001 Multiple myeloma in remission: Secondary | ICD-10-CM | POA: Diagnosis not present

## 2017-04-07 DIAGNOSIS — R479 Unspecified speech disturbances: Secondary | ICD-10-CM | POA: Diagnosis not present

## 2017-04-12 ENCOUNTER — Ambulatory Visit (INDEPENDENT_AMBULATORY_CARE_PROVIDER_SITE_OTHER): Payer: 59 | Admitting: Family Medicine

## 2017-04-12 ENCOUNTER — Encounter: Payer: Self-pay | Admitting: Family Medicine

## 2017-04-12 VITALS — BP 152/88 | Ht 62.0 in | Wt 148.0 lb

## 2017-04-12 DIAGNOSIS — Z131 Encounter for screening for diabetes mellitus: Secondary | ICD-10-CM

## 2017-04-12 DIAGNOSIS — Z8349 Family history of other endocrine, nutritional and metabolic diseases: Secondary | ICD-10-CM

## 2017-04-12 DIAGNOSIS — E785 Hyperlipidemia, unspecified: Secondary | ICD-10-CM | POA: Insufficient documentation

## 2017-04-12 DIAGNOSIS — E7849 Other hyperlipidemia: Secondary | ICD-10-CM | POA: Diagnosis not present

## 2017-04-12 NOTE — Progress Notes (Signed)
   Subjective:    Patient ID: Melissa [name], female    DOB: August 21, 1955, 61 y.o.   MRN: [id number]  HPI  Patient was a [facility] to have a bone marrow biopsy on Thursday of last week. While there she was having some issues with her bp being elevated.They suspect per the pt that she had a TIA while she was there that am. They ran ct of head and cxray which came back normal.She is not on any medications for her BP. Blood pressure checked twice without any difficulty Patient denies difficulty speaking denied slurred speech she feels that everything was related to her blood pressure she does not feel she had a TIA or stroke she denied confusion ER record was reviewed I do not feel the patient needs MRI/MRA I do believe it would be in the patient's best interest to keep blood pressure cholesterol and sugars under good control   Review of Systems  Constitutional: Negative for activity change, fatigue and fever.  HENT: Negative for congestion.   Respiratory: Negative for cough, chest tightness and shortness of breath.   Cardiovascular: Negative for chest pain and leg swelling.  Gastrointestinal: Negative for abdominal pain.  Skin: Negative for color change.  Neurological: Negative for headaches.  Psychiatric/Behavioral: Negative for behavioral problems.       Objective:   Physical Exam  Constitutional: She appears well-developed and well-nourished. No distress.  HENT:  Head: Normocephalic and atraumatic.  Eyes: Right eye exhibits no discharge. Left eye exhibits no discharge.  Neck: No tracheal deviation present.  Cardiovascular: Normal rate, regular rhythm and normal heart sounds.  No murmur heard. Pulmonary/Chest: Effort normal and breath sounds normal. No respiratory distress. She has no wheezes. She has no rales.  Musculoskeletal: She exhibits no edema.  Lymphadenopathy:    She has no cervical adenopathy.  Neurological: She is alert. She exhibits normal muscle tone.  Skin: Skin is warm  and dry. No erythema.  Psychiatric: Her behavior is normal.  Vitals reviewed.         Assessment & Plan:  Family history hemochromatosis patient is concerned about her risk she does see hematology she will discuss with them we will go ahead with some screening labs including CBC ferritin TIBC serum iron patient has had recent liver functions which were normal  We also had a long discussion about the importance of keeping cholesterol under good control sugar under good control to lessen the risk of stroke I do not feel she had a TIA but lab work was ordered she will repeat in 8-12 weeks  Blood pressure-I encouraged patient to exercise on a more regular basis watch the salt intake and also monitor her blood pressures periodically if blood pressures come back moderately elevated then will need to be on medication

## 2017-04-13 ENCOUNTER — Other Ambulatory Visit (HOSPITAL_COMMUNITY)
Admission: RE | Admit: 2017-04-13 | Discharge: 2017-04-13 | Disposition: A | Discharge status: Home / Self Care | Payer: 59 | Source: Ambulatory Visit | Attending: Family Medicine | Admitting: Family Medicine

## 2017-04-13 DIAGNOSIS — Z8349 Family history of other endocrine, nutritional and metabolic diseases: Secondary | ICD-10-CM | POA: Insufficient documentation

## 2017-04-13 LAB — CBC WITH DIFFERENTIAL/PLATELET
BASOS ABS: 0 10*3/uL (ref 0.0–0.1)
BASOS PCT: 0 %
EOS ABS: 0.1 10*3/uL (ref 0.0–0.7)
EOS PCT: 2 %
HCT: 42.8 % (ref 36.0–46.0)
HEMOGLOBIN: 14.1 g/dL (ref 12.0–15.0)
Lymphocytes Relative: 33 %
Lymphs Abs: 1.3 10*3/uL (ref 0.7–4.0)
MCH: 32 pg (ref 26.0–34.0)
MCHC: 32.9 g/dL (ref 30.0–36.0)
MCV: 97.3 fL (ref 78.0–100.0)
Monocytes Absolute: 0.2 10*3/uL (ref 0.1–1.0)
Monocytes Relative: 5 %
NEUTROS ABS: 2.4 10*3/uL (ref 1.7–7.7)
NEUTROS PCT: 60 %
PLATELETS: 213 10*3/uL (ref 150–400)
RBC: 4.4 MIL/uL (ref 3.87–5.11)
RDW: 12.6 % (ref 11.5–15.5)
WBC: 4 10*3/uL (ref 4.0–10.5)

## 2017-04-13 LAB — IRON AND TIBC
Iron: 91 ug/dL (ref 28–170)
SATURATION RATIOS: 30 % (ref 10.4–31.8)
TIBC: 302 ug/dL (ref 250–450)
UIBC: 211 ug/dL

## 2017-04-13 LAB — FERRITIN: FERRITIN: 113 ng/mL (ref 11–307)

## 2017-04-19 DIAGNOSIS — M25642 Stiffness of left hand, not elsewhere classified: Secondary | ICD-10-CM | POA: Diagnosis not present

## 2017-04-19 DIAGNOSIS — M720 Palmar fascial fibromatosis [Dupuytren]: Secondary | ICD-10-CM | POA: Diagnosis not present

## 2017-04-19 DIAGNOSIS — M79642 Pain in left hand: Secondary | ICD-10-CM | POA: Diagnosis not present

## 2017-06-18 ENCOUNTER — Other Ambulatory Visit (HOSPITAL_COMMUNITY)
Admission: RE | Admit: 2017-06-18 | Discharge: 2017-06-18 | Disposition: A | Discharge status: Home / Self Care | Payer: 59 | Source: Ambulatory Visit | Attending: Family Medicine | Admitting: Family Medicine

## 2017-06-18 DIAGNOSIS — Z131 Encounter for screening for diabetes mellitus: Secondary | ICD-10-CM | POA: Insufficient documentation

## 2017-06-18 DIAGNOSIS — E7849 Other hyperlipidemia: Secondary | ICD-10-CM | POA: Insufficient documentation

## 2017-06-18 DIAGNOSIS — E039 Hypothyroidism, unspecified: Secondary | ICD-10-CM | POA: Insufficient documentation

## 2017-06-18 LAB — GLUCOSE, RANDOM: Glucose, Bld: 93 mg/dL (ref 65–99)

## 2017-06-18 LAB — TSH: TSH: 1.154 u[IU]/mL (ref 0.350–4.500)

## 2017-06-18 LAB — LIPID PANEL
CHOLESTEROL: 210 mg/dL — AB (ref 0–200)
HDL: 65 mg/dL (ref >40)
LDL Cholesterol: 130 mg/dL — ABNORMAL HIGH (ref 0–99)
TRIGLYCERIDES: 73 mg/dL (ref <150)
Total CHOL/HDL Ratio: 3.2 RATIO
VLDL: 15 mg/dL (ref 0–40)

## 2017-06-18 LAB — T4, FREE: Free T4: 0.97 ng/dL (ref 0.61–1.12)

## 2017-06-19 LAB — HEMOGLOBIN A1C
HEMOGLOBIN A1C: 5.2 % (ref 4.8–5.6)
MEAN PLASMA GLUCOSE: 103 mg/dL

## 2017-06-30 ENCOUNTER — Other Ambulatory Visit (HOSPITAL_COMMUNITY): Payer: Self-pay | Admitting: *Deleted

## 2017-06-30 DIAGNOSIS — C9001 Multiple myeloma in remission: Secondary | ICD-10-CM

## 2017-07-01 ENCOUNTER — Other Ambulatory Visit (HOSPITAL_COMMUNITY): Payer: 59

## 2017-07-02 ENCOUNTER — Inpatient Hospital Stay (HOSPITAL_COMMUNITY): Payer: 59 | Attending: Internal Medicine

## 2017-07-02 DIAGNOSIS — E785 Hyperlipidemia, unspecified: Secondary | ICD-10-CM | POA: Diagnosis not present

## 2017-07-02 DIAGNOSIS — C9001 Multiple myeloma in remission: Secondary | ICD-10-CM | POA: Diagnosis not present

## 2017-07-02 DIAGNOSIS — E039 Hypothyroidism, unspecified: Secondary | ICD-10-CM | POA: Insufficient documentation

## 2017-07-02 LAB — CBC WITH DIFFERENTIAL/PLATELET
BASOS ABS: 0 10*3/uL (ref 0.0–0.1)
Basophils Relative: 1 %
EOS PCT: 3 %
Eosinophils Absolute: 0.1 10*3/uL (ref 0.0–0.7)
HCT: 41.8 % (ref 36.0–46.0)
Hemoglobin: 14 g/dL (ref 12.0–15.0)
LYMPHS PCT: 33 %
Lymphs Abs: 1.4 10*3/uL (ref 0.7–4.0)
MCH: 32.6 pg (ref 26.0–34.0)
MCHC: 33.5 g/dL (ref 30.0–36.0)
MCV: 97.4 fL (ref 78.0–100.0)
Monocytes Absolute: 0.4 10*3/uL (ref 0.1–1.0)
Monocytes Relative: 11 %
NEUTROS ABS: 2.2 10*3/uL (ref 1.7–7.7)
Neutrophils Relative %: 52 %
PLATELETS: 212 10*3/uL (ref 150–400)
RBC: 4.29 MIL/uL (ref 3.87–5.11)
RDW: 12.8 % (ref 11.5–15.5)
WBC: 4.2 10*3/uL (ref 4.0–10.5)

## 2017-07-02 LAB — LACTATE DEHYDROGENASE: LDH: 136 U/L (ref 98–192)

## 2017-07-02 LAB — COMPREHENSIVE METABOLIC PANEL
ALT: 20 U/L (ref 14–54)
AST: 24 U/L (ref 15–41)
Albumin: 4.2 g/dL (ref 3.5–5.0)
Alkaline Phosphatase: 53 U/L (ref 38–126)
Anion gap: 8 (ref 5–15)
BUN: 20 mg/dL (ref 6–20)
CHLORIDE: 101 mmol/L (ref 101–111)
CO2: 29 mmol/L (ref 22–32)
CREATININE: 0.9 mg/dL (ref 0.44–1.00)
Calcium: 9.5 mg/dL (ref 8.9–10.3)
GFR calc Af Amer: >60 mL/min (ref >60)
GLUCOSE: 76 mg/dL (ref 65–99)
Potassium: 4.1 mmol/L (ref 3.5–5.1)
Sodium: 138 mmol/L (ref 135–145)
Total Bilirubin: 0.7 mg/dL (ref 0.3–1.2)
Total Protein: 7.4 g/dL (ref 6.5–8.1)

## 2017-07-03 LAB — IGG, IGA, IGM
IGA: 192 mg/dL (ref 87–352)
IGG (IMMUNOGLOBIN G), SERUM: 1063 mg/dL (ref 700–1600)
IGM (IMMUNOGLOBULIN M), SRM: 59 mg/dL (ref 26–217)

## 2017-07-05 LAB — PROTEIN ELECTROPHORESIS, SERUM
A/G RATIO SPE: 1.4 (ref 0.7–1.7)
ALBUMIN ELP: 4 g/dL (ref 2.9–4.4)
ALPHA-1-GLOBULIN: 0.1 g/dL (ref 0.0–0.4)
Alpha-2-Globulin: 0.8 g/dL (ref 0.4–1.0)
BETA GLOBULIN: 1 g/dL (ref 0.7–1.3)
GAMMA GLOBULIN: 1 g/dL (ref 0.4–1.8)
Globulin, Total: 2.9 g/dL (ref 2.2–3.9)
Total Protein ELP: 6.9 g/dL (ref 6.0–8.5)

## 2017-07-05 LAB — KAPPA/LAMBDA LIGHT CHAINS
Kappa free light chain: 16.1 mg/L (ref 3.3–19.4)
Kappa, lambda light chain ratio: 1.45 (ref 0.26–1.65)
Lambda free light chains: 11.1 mg/L (ref 5.7–26.3)

## 2017-07-05 MED FILL — LEVOTHYROXINE 75 MCG TABLET: 75 | 90 days supply | Qty: 90 | Fill #1

## 2017-07-06 LAB — IMMUNOFIXATION ELECTROPHORESIS
IGA: 200 mg/dL (ref 87–352)
IGG (IMMUNOGLOBIN G), SERUM: 1058 mg/dL (ref 700–1600)
IgM (Immunoglobulin M), Srm: 52 mg/dL (ref 26–217)
TOTAL PROTEIN ELP: 7 g/dL (ref 6.0–8.5)

## 2017-07-08 ENCOUNTER — Inpatient Hospital Stay (HOSPITAL_BASED_OUTPATIENT_CLINIC_OR_DEPARTMENT_OTHER): Payer: 59 | Admitting: Internal Medicine

## 2017-07-08 ENCOUNTER — Encounter (HOSPITAL_COMMUNITY): Payer: Self-pay | Admitting: Internal Medicine

## 2017-07-08 VITALS — BP 126/66 | HR 95 | Temp 98.2°F | Resp 16 | Wt 147.9 lb

## 2017-07-08 DIAGNOSIS — E785 Hyperlipidemia, unspecified: Secondary | ICD-10-CM

## 2017-07-08 DIAGNOSIS — C9001 Multiple myeloma in remission: Secondary | ICD-10-CM

## 2017-07-08 DIAGNOSIS — E039 Hypothyroidism, unspecified: Secondary | ICD-10-CM

## 2017-07-08 DIAGNOSIS — C9000 Multiple myeloma not having achieved remission: Secondary | ICD-10-CM

## 2017-07-08 NOTE — Patient Instructions (Addendum)
[place] at [facility] Discharge Instructions  You were seen today by Dr. Walden Field. She went over your recent lab results and everything looked good. Continue following up with Dr. Norma Fredrickson as well. We will see you for follow up in 6 months and schedule you for labs every 3 months.    Thank you for choosing [place] at [facility] to provide your oncology and hematology care.  To afford each patient quality time with our provider, please arrive at least 15 minutes before your scheduled appointment time.   If you have a lab appointment with the [place] please come in thru the  Main Entrance and check in at the main information desk  You need to re-schedule your appointment should you arrive 10 or more minutes late.  We strive to give you quality time with our providers, and arriving late affects you and other patients whose appointments are after yours.  Also, if you no show three or more times for appointments you may be dismissed from the clinic at the providers discretion.     Again, thank you for choosing [facility]  Our hope is that these requests will decrease the amount of time that you wait before being seen by our physicians.       _____________________________________________________________  Should you have questions after your visit to [facility], please contact our office at [phone] between the hours of 8:30 a.m. and 4:30 p.m.  Voicemails left after 4:30 p.m. will not be returned until the following business day.  For prescription refill requests, have your pharmacy contact our office.       Resources For Cancer Patients and their Caregivers ? American Cancer Society: Can assist with transportation, wigs, general needs, runs Look Good Feel Better.        9201722677 ? Cancer Care: Provides financial assistance, online support groups, medication/co-pay assistance.  1-800-813-HOPE  (939)668-1193) ? [facility] Assists [place] cancer patients and their families through emotional , educational and financial support.  8474579380 ? [place] DSS Where to apply for food stamps, Medicaid and utility assistance. 520-274-7953 ? [facility]: Transportation to medical appointments. (870) 496-9345 ? Social Security Administration: May apply for disability if have a Stage IV cancer. (807)132-2315 (709) 797-2331 ? [employer], Disability and Transit Services: Assists with nutrition, care and transit needs. [place] Support Programs:   > Cancer Support Group  2nd Tuesday of the month 1pm-2pm, Journey Room   > Creative Journey  3rd Tuesday of the month 1130am-1pm, Journey Room

## 2017-07-08 NOTE — Progress Notes (Signed)
Diagnosis Multiple myeloma not having achieved remission ([place]) - Plan: CBC with Differential/Platelet, Comprehensive metabolic panel, Lactate dehydrogenase, Protein electrophoresis, serum, Kappa/lambda light chains, IgG, IgA, IgM, Beta 2 microglobuline, serum  Staging Cancer Staging No matching staging information was found for the patient.  Assessment and Plan:  1.  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  She discontinued Revlimid in October 2014 (3.5 years of maintenance therapy) She discontinued Zometa in August 2014.  She is followed by Dr. Norma Fredrickson and had recent bone marrow biopsy done on 04/07/2017.  Pathology returned as no morphological or immunophenotypic evidence of plasma cell neoplasm.  Labs performed 07/02/2016 showed no monoclonal protein.  She has normal quantitative immunoglobulins and shows no evidence of anemia, hypercalcemia, or renal insufficiency.   She reports Dr. Norma Fredrickson has discussed with her ongoing maintenance therapy with Revlimid.  She reports she is resistant to restarting Revlimid due to problems with tolerance of therapy.  She reports he has discussed with her potential risk of recurrence based on her history of myeloma.  I have discussed with her she will need to have ongoing continued follow-up with Dr. Norma Fredrickson.  Based on him being her transplant physician he has discussed with her potential risk for relapse and has recommended ongoing maintenance therapy.  She is advised to follow-up with him if she continues to have questions and needs ongoing clarification.  We will continue to alternate visits with his office and she will return to clinic in 6 months for follow-up and labs.  I will also contact Dr. Norma Fredrickson regarding her concerns.  2.  Hypothyroidism.  She is on Synthroid.  Continue to follow-up with her primary care physician.  3.  Health maintenance.  Last DEXA in  December 2018 was normal.  Screening mammogram February 17, 2017 was negative.  She should have repeat mammogram in October 2019.  INTERVAL HISTORY: 62 year old female who has not been seen at [facility] since 2016.  She is followed by Dr. Norma Fredrickson at [facility].   She has been off  Revlimid since October 2014.  She was on maintenance Revlimid for 3.5 years.  She reports she continues her health screenings with mammogram and is up-to-date on her colonoscopies and bone density.  Current Status: Patient is seen today for follow-up.  She was recently seen by Dr. Norma Fredrickson at [facility] and has undergone a bone marrow biopsy on 04/07/2017.   She reports Dr. Norma Fredrickson has discussed with her ongoing maintenance therapy with Revlimid.  She reports she is resistant to restarting Revlimid due to problems with tolerance of therapy.   He reports he has discussed with her potential risk of recurrence based on her history of myeloma.  Pathology returned as no morphological or immunophenotypic or evidence of plasma cell neoplasm. Problem List Patient Active Problem List   Diagnosis Date Noted  . Hyperlipidemia [E78.5] 04/12/2017  . Family history of hemochromatosis [Z83.49] 04/12/2017  . Hypothyroidism [E03.9] 07/14/2012  . Multiple myeloma ([place]) [C90.00] 11/02/2011    Past Medical History Past Medical History:  Diagnosis Date  . History of stem cell transplant ([place])    for multiple myeloma  . Hypothyroidism   . Multiple myeloma   . Peripheral neuropathy   . Seasonal allergies   . Thyroid disease     Past Surgical History Past Surgical History:  Procedure Laterality Date  . CATARACT EXTRACTION W/PHACO Right 04/04/2013   Procedure: CATARACT EXTRACTION  PHACO AND INTRAOCULAR LENS PLACEMENT (IOC);  Surgeon: Elta Guadeloupe T. Gershon Crane, MD;  Location: [facility];  Service: Ophthalmology;  Laterality: Right;  CDE:3.42  . CATARACT EXTRACTION W/PHACO Left 04/18/2013   Procedure: CATARACT EXTRACTION PHACO AND INTRAOCULAR LENS PLACEMENT  (IOC);  Surgeon: Elta Guadeloupe T. Gershon Crane, MD;  Location: [facility];  Service: Ophthalmology;  Laterality: Left;  CDE:4.91  . CESAREAN SECTION  06/1993  . FASCIECTOMY Left 03/02/2017   Procedure: FASCIECTOMY, LEFT SMALL FINGER;  Surgeon: Daryll Brod, MD;  Location: [place];  Service: Orthopedics;  Laterality: Left;  . TONSILECTOMY, ADENOIDECTOMY, BILATERAL MYRINGOTOMY AND TUBES    . TONSILLECTOMY    . TUBAL LIGATION    . YAG LASER APPLICATION Right 08/03/8117   Procedure: YAG LASER APPLICATION;  Surgeon: Rutherford Guys, MD;  Location: [facility];  Service: Ophthalmology;  Laterality: Right;  . YAG LASER APPLICATION Left 1/47/8295   Procedure: YAG LASER APPLICATION;  Surgeon: Rutherford Guys, MD;  Location: [facility];  Service: Ophthalmology;  Laterality: Left;    Family History Family History  Problem Relation Age of Onset  . Hypertension Mother   . Heart attack Mother   . Diabetes Mother   . Skin cancer Mother   . Hypertension Father   . Heart attack Father   . Skin cancer Father   . Heart disease Maternal Grandmother   . Stroke Maternal Grandfather   . Hypertension Sister   . Skin cancer Sister   . Hypertension Brother   . Skin cancer Brother   . Hypertension Brother   . Cancer Brother        melanoma  . Skin cancer Brother   . Factor V Leiden deficiency Daughter      Social History  reports that  has never smoked. she has never used smokeless tobacco. She reports that she drinks alcohol. She reports that she does not use drugs.  Medications  Current Outpatient Medications:  .  Calcium Carbonate-Vitamin D (CALCIUM + D PO), Take 1 tablet by mouth daily., Disp: , Rfl:  .  Cholecalciferol (VITAMIN D) 2000 UNITS tablet, Take 2,000 Units by mouth daily., Disp: , Rfl:  .  ibuprofen (ADVIL,MOTRIN) 200 MG tablet, Take 200 mg by mouth as needed., Disp: , Rfl:  .  levothyroxine (SYNTHROID, LEVOTHROID) 75 MCG tablet, Take 1 tablet (75 mcg total) by mouth daily., Disp: 90 tablet, Rfl:  3 No current facility-administered medications for this visit.   Facility-Administered Medications Ordered in Other Visits:  .  0.9 %  sodium chloride infusion, , Intravenous, Continuous, Kefalas, Manon Hilding, PA-C, Last Rate: 20 mL/hr at 12/05/10 0947 .  0.9 %  sodium chloride infusion, , Intravenous, Continuous, Neijstrom, Eric, MD, Last Rate: 20 mL/hr at 02/26/11 1528 .  sodium chloride 0.9 % injection 10 mL, 10 mL, Intravenous, PRN, Everardo All, MD, 10 mL at 02/26/11 1518  Allergies Hydromorphone hcl; Poison ivy extract [poison ivy extract]; and Tape  Review of Systems Review of Systems - Oncology ROS as per HPI otherwise 12 point ROS is negative other than neuropathy   Physical Exam  Vitals Wt Readings from Last 3 Encounters:  07/08/17 147 lb 14.4 oz (67.1 kg)  04/12/17 148 lb (67.1 kg)  03/02/17 147 lb (66.7 kg)   Temp Readings from Last 3 Encounters:  07/08/17 98.2 F (36.8 C) (Oral)  03/02/17 97.6 F (36.4 C) (Oral)  11/09/16 98.8 F (37.1 C) (Oral)   BP Readings from Last 3 Encounters:  07/08/17 126/66  04/12/17 (!) 152/88  03/02/17 (!) 142/68   Pulse Readings from Last 3 Encounters:  07/08/17 95  03/02/17 75  08/04/16 88   Constitutional: Well-developed, well-nourished, and in no distress.   HENT: Head: Normocephalic and atraumatic.  Mouth/Throat: No oropharyngeal exudate. Mucosa moist. Eyes: Pupils are equal, round, and reactive to light. Conjunctivae are normal. No scleral icterus.  Neck: Normal range of motion. Neck supple. No JVD present.  Cardiovascular: Normal rate, regular rhythm and normal heart sounds.  Exam reveals no gallop and no friction rub.   No murmur heard. Pulmonary/Chest: Effort normal and breath sounds normal. No respiratory distress. No wheezes.No rales.  Abdominal: Soft. Bowel sounds are normal. No distension. There is no tenderness. There is no guarding.  Musculoskeletal: No edema or tenderness.  Lymphadenopathy: No cervical,  axillary or supraclavicular adenopathy.  Neurological: Alert and oriented to person, place, and time. No cranial nerve deficit.  Skin: Skin is warm and dry. No rash noted. No erythema. No pallor.  Psychiatric: Affect and judgment normal.   Labs No visits with results within 3 Day(s) from this visit.  Latest known visit with results is:  Appointment on 07/02/2017  Component Date Value Ref Range Status  . WBC 07/02/2017 4.2  4.0 - 10.5 K/uL Final  . RBC 07/02/2017 4.29  3.87 - 5.11 MIL/uL Final  . Hemoglobin 07/02/2017 14.0  12.0 - 15.0 g/dL Final  . HCT 07/02/2017 41.8  36.0 - 46.0 % Final  . MCV 07/02/2017 97.4  78.0 - 100.0 fL Final  . MCH 07/02/2017 32.6  26.0 - 34.0 pg Final  . MCHC 07/02/2017 33.5  30.0 - 36.0 g/dL Final  . RDW 07/02/2017 12.8  11.5 - 15.5 % Final  . Platelets 07/02/2017 212  150 - 400 K/uL Final  . Neutrophils Relative % 07/02/2017 52  % Final  . Neutro Abs 07/02/2017 2.2  1.7 - 7.7 K/uL Final  . Lymphocytes Relative 07/02/2017 33  % Final  . Lymphs Abs 07/02/2017 1.4  0.7 - 4.0 K/uL Final  . Monocytes Relative 07/02/2017 11  % Final  . Monocytes Absolute 07/02/2017 0.4  0.1 - 1.0 K/uL Final  . Eosinophils Relative 07/02/2017 3  % Final  . Eosinophils Absolute 07/02/2017 0.1  0.0 - 0.7 K/uL Final  . Basophils Relative 07/02/2017 1  % Final  . Basophils Absolute 07/02/2017 0.0  0.0 - 0.1 K/uL Final   Performed at [facility], [address] [place], [place] [address]  . Sodium 07/02/2017 138  135 - 145 mmol/L Final  . Potassium 07/02/2017 4.1  3.5 - 5.1 mmol/L Final  . Chloride 07/02/2017 101  101 - 111 mmol/L Final  . CO2 07/02/2017 29  22 - 32 mmol/L Final  . Glucose, Bld 07/02/2017 76  65 - 99 mg/dL Final  . BUN 07/02/2017 20  6 - 20 mg/dL Final  . Creatinine, Ser 07/02/2017 0.90  0.44 - 1.00 mg/dL Final  . Calcium 07/02/2017 9.5  8.9 - 10.3 mg/dL Final  . Total Protein 07/02/2017 7.4  6.5 - 8.1 g/dL Final  . Albumin 07/02/2017 4.2  3.5 - 5.0 g/dL Final   . AST 07/02/2017 24  15 - 41 U/L Final  . ALT 07/02/2017 20  14 - 54 U/L Final  . Alkaline Phosphatase 07/02/2017 53  38 - 126 U/L Final  . Total Bilirubin 07/02/2017 0.7  0.3 - 1.2 mg/dL Final  . GFR calc non Af Amer 07/02/2017 >60  >60 mL/min Final  . GFR calc Af Wyvonnia Lora  07/02/2017 >60  >60 mL/min Final   Comment: (NOTE) The eGFR has been calculated using the CKD EPI equation. This calculation has not been validated in all clinical situations. eGFR's persistently <60 mL/min signify possible Chronic Kidney Disease.   Georgiann Hahn gap 07/02/2017 8  5 - 15 Final   Performed at [facility], [address], [place], [place] [address]  . LDH 07/02/2017 136  98 - 192 U/L Final   Performed at [facility], [address], [place], [place] [address]  . Kappa free light chain 07/02/2017 16.1  3.3 - 19.4 mg/L Final  . Lamda free light chains 07/02/2017 11.1  5.7 - 26.3 mg/L Final  . Kappa, lamda light chain ratio 07/02/2017 1.45  0.26 - 1.65 Final   Comment: (NOTE) Performed At: [facility] [place], [place] [id number] Rush Farmer MD JM:[id number] Performed at [facility], [address], [place], [place] [address]   . IgG (Immunoglobin G), Serum 07/02/2017 1,063  700 - 1,600 mg/dL Final  . IgA 07/02/2017 192  87 - 352 mg/dL Final  . IgM (Immunoglobulin M), Srm 07/02/2017 59  26 - 217 mg/dL Final   Comment: (NOTE) Performed At: [facility] [place], [place] [id number] Rush Farmer MD EY:[id number] Performed at [facility], [address], [place], [place] [address]   . Total Protein ELP 07/02/2017 7.0  6.0 - 8.5 g/dL Final  . IgG (Immunoglobin G), Serum 07/02/2017 1,058  700 - 1,600 mg/dL Final  . IgA 07/02/2017 200  87 - 352 mg/dL Final  . IgM (Immunoglobulin M), Srm 07/02/2017 52  26 - 217 mg/dL Final   Comment: (NOTE) Performed At: [facility] [place], [place] [id number] Rush Farmer MD OY:[id number]   .  Immunofixation Result, Serum 07/02/2017 Comment   Corrected   Comment: An apparent normal immunofixation pattern. Performed at [facility], [address] [place], [place] [address]   . Total Protein ELP 07/02/2017 6.9  6.0 - 8.5 g/dL Final  . Albumin ELP 07/02/2017 4.0  2.9 - 4.4 g/dL Final  . Alpha-1-Globulin 07/02/2017 0.1  0.0 - 0.4 g/dL Final  . Alpha-2-Globulin 07/02/2017 0.8  0.4 - 1.0 g/dL Final  . Beta Globulin 07/02/2017 1.0  0.7 - 1.3 g/dL Final  . Gamma Globulin 07/02/2017 1.0  0.4 - 1.8 g/dL Final  . M-Spike, % 07/02/2017 Not Observed  Not Observed g/dL Final  . SPE Interp. 07/02/2017 Comment   Final   Comment: (NOTE) The SPE pattern appears essentially unremarkable. Evidence of monoclonal protein is not apparent. Performed At: [facility] [place], [place] [id number] Rush Farmer MD OQ:[id number]   . Comment 07/02/2017 Comment   Final   Comment: (NOTE) Protein electrophoresis scan will follow via computer, mail, or courier delivery.   Marland Kitchen GLOBULIN, TOTAL 07/02/2017 2.9  2.2 - 3.9 g/dL Corrected  . A/G Ratio 07/02/2017 1.4  0.7 - 1.7 Corrected   Performed at [facility], [address], [place], [place] [address]     Pathology Orders Placed This Encounter  Procedures  . CBC with Differential/Platelet    Standing Status:   Future    Standing Expiration Date:   07/09/2018  . Comprehensive metabolic panel    Standing Status:   Future    Standing Expiration Date:   07/09/2018  . Lactate dehydrogenase    Standing Status:   Future    Standing Expiration Date:   07/09/2018  . Protein electrophoresis, serum  Standing Status:   Future    Standing Expiration Date:   07/09/2018  . Kappa/lambda light chains    Standing Status:   Future    Standing Expiration Date:   07/08/2018  . IgG, IgA, IgM    Standing Status:   Future    Standing Expiration Date:   07/08/2018  . Beta 2 microglobuline, serum    Standing Status:   Future    Standing  Expiration Date:   07/08/2018       Zoila Shutter MD

## 2017-07-19 MED FILL — CHLORHEXIDINE 0.12% RINSE: 0.12 | 16 days supply | Qty: 473 | Fill #0

## 2017-07-19 MED FILL — AMOX TR-K CLV 875-125 MG TA: 875-125 | 10 days supply | Qty: 20 | Fill #0

## 2017-08-10 DIAGNOSIS — H524 Presbyopia: Secondary | ICD-10-CM | POA: Diagnosis not present

## 2017-08-10 DIAGNOSIS — H5203 Hypermetropia, bilateral: Secondary | ICD-10-CM | POA: Diagnosis not present

## 2017-08-10 DIAGNOSIS — H52203 Unspecified astigmatism, bilateral: Secondary | ICD-10-CM | POA: Diagnosis not present

## 2017-08-11 ENCOUNTER — Other Ambulatory Visit (HOSPITAL_COMMUNITY)
Admission: RE | Admit: 2017-08-11 | Discharge: 2017-08-11 | Disposition: A | Discharge status: Home / Self Care | Payer: 59 | Source: Ambulatory Visit | Attending: Obstetrics and Gynecology | Admitting: Obstetrics and Gynecology

## 2017-08-11 ENCOUNTER — Ambulatory Visit (INDEPENDENT_AMBULATORY_CARE_PROVIDER_SITE_OTHER): Payer: 59 | Admitting: Obstetrics and Gynecology

## 2017-08-11 ENCOUNTER — Encounter: Payer: Self-pay | Admitting: Obstetrics and Gynecology

## 2017-08-11 ENCOUNTER — Other Ambulatory Visit: Payer: Self-pay

## 2017-08-11 VITALS — BP 124/72 | HR 84

## 2017-08-11 DIAGNOSIS — Z01419 Encounter for gynecological examination (general) (routine) without abnormal findings: Secondary | ICD-10-CM

## 2017-08-11 NOTE — Progress Notes (Signed)
Subjective:    Melissa [name] is a 62 y.o. female who presents for an annual exam. The patient has no complaints today. The patient is not currently sexually active. GYN screening history: last pap: approximate date 2015 and was normal and last mammogram: was normal. The patient wears seatbelts: yes. The patient participates in regular exercise: no. Has the patient ever been transfused or tattooed?: yes. The patient reports that there is not domestic violence in her life.   Menstrual History: OB History    Gravida  3   Para  1   Term      Preterm      AB  2   Living  1     SAB  2   TAB      Ectopic      Multiple      Live Births              Menarche age:  No LMP recorded. Patient is postmenopausal.    The following portions of the patient's history were reviewed and updated as appropriate: allergies, current medications, past family history, past social history, past surgical history and problem list.  Review of Systems Pertinent items are noted in HPI.    Objective:    BP 124/72 (BP Location: Right Arm, Patient Position: Sitting, Cuff Size: Normal)   Pulse 84   General Appearance:    Alert, cooperative, no distress, appears stated age  Head:    Normocephalic, without obvious abnormality, atraumatic  Eyes:    PERRL, conjunctiva/corneas clear, EOM's intact, fundi    benign, both eyes  Ears:    Normal TM's and external ear canals, both ears  Nose:   Nares normal, septum midline, mucosa normal, no drainage    or sinus tenderness  Throat:   Lips, mucosa, and tongue normal; teeth and gums normal  Neck:   Supple, symmetrical, trachea midline, no adenopathy;    thyroid:  no enlargement/tenderness/nodules; no carotid   bruit or JVD  Back:     Symmetric, no curvature, ROM normal, no CVA tenderness  Lungs:     Clear to auscultation bilaterally, respirations unlabored  Chest Wall:    No tenderness or deformity   Heart:    Regular rate and rhythm, S1 and S2 normal,  no murmur, rub   or gallop  Breast Exam:    No tenderness, masses, or nipple abnormality  Abdomen:     Soft, non-tender, bowel sounds active all four quadrants,    no masses, no organomegaly  Genitalia:    Normal female without lesion, discharge or tenderness  Rectal:    Not done , declined by pt who will do 3 self cards  Extremities:   Extremities normal, atraumatic, no cyanosis or edema  Pulses:   2+ and symmetric all extremities  Skin:   Skin color, texture, turgor normal, no rashes or lesions  Lymph nodes:   Cervical, supraclavicular, and axillary nodes normal  Neurologic:   CNII-XII intact, normal strength, sensation and reflexes    throughout  .    Assessment:    Healthy female exam.    Plan:     All questions answered. Breast self exam technique reviewed and patient encouraged to perform self-exam monthly. Diagnosis explained in detail, including differential. Thin prep Pap smear.

## 2017-08-13 LAB — CYTOLOGY - PAP
Diagnosis: NEGATIVE
HPV: NOT DETECTED

## 2017-09-28 DIAGNOSIS — C9001 Multiple myeloma in remission: Secondary | ICD-10-CM | POA: Diagnosis not present

## 2017-09-28 DIAGNOSIS — Z9484 Stem cells transplant status: Secondary | ICD-10-CM | POA: Diagnosis not present

## 2017-09-29 ENCOUNTER — Other Ambulatory Visit (HOSPITAL_COMMUNITY)
Admission: RE | Admit: 2017-09-29 | Discharge: 2017-09-29 | Disposition: A | Discharge status: Home / Self Care | Payer: 59 | Source: Ambulatory Visit | Attending: Nurse Practitioner | Admitting: Nurse Practitioner

## 2017-09-29 DIAGNOSIS — C9001 Multiple myeloma in remission: Secondary | ICD-10-CM | POA: Diagnosis not present

## 2017-09-29 LAB — COMPREHENSIVE METABOLIC PANEL
ALK PHOS: 49 U/L (ref 38–126)
ALT: 19 U/L (ref 14–54)
AST: 23 U/L (ref 15–41)
Albumin: 4.1 g/dL (ref 3.5–5.0)
Anion gap: 5 (ref 5–15)
BUN: 22 mg/dL — AB (ref 6–20)
CALCIUM: 9.4 mg/dL (ref 8.9–10.3)
CO2: 30 mmol/L (ref 22–32)
Chloride: 104 mmol/L (ref 101–111)
Creatinine, Ser: 0.91 mg/dL (ref 0.44–1.00)
GFR calc Af Amer: >60 mL/min (ref >60)
GLUCOSE: 97 mg/dL (ref 65–99)
POTASSIUM: 4.4 mmol/L (ref 3.5–5.1)
Sodium: 139 mmol/L (ref 135–145)
Total Bilirubin: 0.5 mg/dL (ref 0.3–1.2)
Total Protein: 7.1 g/dL (ref 6.5–8.1)

## 2017-09-29 LAB — CBC WITH DIFFERENTIAL/PLATELET
BASOS ABS: 0 10*3/uL (ref 0.0–0.1)
Basophils Relative: 1 %
Eosinophils Absolute: 0.1 10*3/uL (ref 0.0–0.7)
Eosinophils Relative: 3 %
HEMATOCRIT: 40.7 % (ref 36.0–46.0)
HEMOGLOBIN: 13.7 g/dL (ref 12.0–15.0)
LYMPHS PCT: 35 %
Lymphs Abs: 1.5 10*3/uL (ref 0.7–4.0)
MCH: 32.9 pg (ref 26.0–34.0)
MCHC: 33.7 g/dL (ref 30.0–36.0)
MCV: 97.6 fL (ref 78.0–100.0)
MONO ABS: 0.4 10*3/uL (ref 0.1–1.0)
MONOS PCT: 8 %
NEUTROS ABS: 2.4 10*3/uL (ref 1.7–7.7)
NEUTROS PCT: 53 %
Platelets: 196 10*3/uL (ref 150–400)
RBC: 4.17 MIL/uL (ref 3.87–5.11)
RDW: 12.8 % (ref 11.5–15.5)
WBC: 4.4 10*3/uL (ref 4.0–10.5)

## 2017-09-30 LAB — IGG, IGA, IGM
IgA: 183 mg/dL (ref 87–352)
IgG (Immunoglobin G), Serum: 984 mg/dL (ref 700–1600)
IgM (Immunoglobulin M), Srm: 53 mg/dL (ref 26–217)

## 2017-09-30 LAB — KAPPA/LAMBDA LIGHT CHAINS
Kappa free light chain: 15 mg/L (ref 3.3–19.4)
Kappa, lambda light chain ratio: 1.35 (ref 0.26–1.65)
Lambda free light chains: 11.1 mg/L (ref 5.7–26.3)

## 2017-10-01 LAB — IMMUNOFIXATION ELECTROPHORESIS
IGA: 185 mg/dL (ref 87–352)
IgG (Immunoglobin G), Serum: 975 mg/dL (ref 700–1600)
IgM (Immunoglobulin M), Srm: 52 mg/dL (ref 26–217)
Total Protein ELP: 6.8 g/dL (ref 6.0–8.5)

## 2017-10-01 LAB — PROTEIN ELECTROPHORESIS, SERUM
A/G RATIO SPE: 1.3 (ref 0.7–1.7)
ALPHA-1-GLOBULIN: 0.2 g/dL (ref 0.0–0.4)
ALPHA-2-GLOBULIN: 0.8 g/dL (ref 0.4–1.0)
Albumin ELP: 3.8 g/dL (ref 2.9–4.4)
BETA GLOBULIN: 0.9 g/dL (ref 0.7–1.3)
GLOBULIN, TOTAL: 2.9 g/dL (ref 2.2–3.9)
Gamma Globulin: 1 g/dL (ref 0.4–1.8)
Total Protein ELP: 6.7 g/dL (ref 6.0–8.5)

## 2017-10-07 ENCOUNTER — Other Ambulatory Visit (HOSPITAL_COMMUNITY): Payer: Self-pay | Admitting: *Deleted

## 2017-10-08 ENCOUNTER — Other Ambulatory Visit (HOSPITAL_COMMUNITY): Payer: 59

## 2017-10-11 ENCOUNTER — Encounter: Payer: Self-pay | Admitting: Family Medicine

## 2017-10-11 ENCOUNTER — Ambulatory Visit (INDEPENDENT_AMBULATORY_CARE_PROVIDER_SITE_OTHER): Payer: 59 | Admitting: Family Medicine

## 2017-10-11 VITALS — BP 138/84 | Temp 97.7°F | Ht 62.0 in | Wt 147.0 lb

## 2017-10-11 DIAGNOSIS — E785 Hyperlipidemia, unspecified: Secondary | ICD-10-CM | POA: Diagnosis not present

## 2017-10-11 DIAGNOSIS — R51 Headache: Secondary | ICD-10-CM | POA: Diagnosis not present

## 2017-10-11 DIAGNOSIS — R519 Headache, unspecified: Secondary | ICD-10-CM

## 2017-10-11 DIAGNOSIS — E039 Hypothyroidism, unspecified: Secondary | ICD-10-CM | POA: Diagnosis not present

## 2017-10-11 NOTE — Progress Notes (Signed)
   Subjective:    Patient ID: Melissa [name], female    DOB: Mar 04, 1956, 62 y.o.   MRN: [id number]  HPIHypothyroidism. Takes levothyroxine 75 mcg daily.  Denies any problem Patient has thyroid condition.  Takes thyroid medication on a regular basis.  States that the proper way.  Relates compliance.  States no negative side effects.  States condition seems to be under good control.   Headache above right eye off and on for 3 -4 weeks. Tried advil and it does help.  These headaches have been for the past 3 to 4 weeks to some degree seem to be stress related but patient not depressed does not wake her up at night no double vision no vomiting with it energy level fairly decent   Review of Systems Relates intermittent headaches denies vomiting denies diarrhea double vision denies wheezing difficulty breathing no sweats chills fevers or weight loss    Objective:   Physical Exam EOMI eardrums normal throat is normal neck no masses lungs are clear no crackles heart is regular blood pressure upper limits of normal 138/84       Assessment & Plan:  Blood pressure borderline elevated to watch closely diet stay physically active monitor blood pressure readings send him back to [place]  Follow-up in 1 month  Intermittent frequent headaches above the right eye no red flags patient will monitor her headaches will keep a headache diary will follow-up in 1 month  Hypothyroidism check lab work later this year continue medication

## 2017-10-11 NOTE — Patient Instructions (Signed)
DASH Eating Plan DASH stands for "Dietary Approaches to Stop Hypertension." The DASH eating plan is a healthy eating plan that has been shown to reduce high blood pressure (hypertension). It may also reduce your risk for type 2 diabetes, heart disease, and stroke. The DASH eating plan may also help with weight loss. What are tips for following this plan? General guidelines  Avoid eating more than 2,300 mg (milligrams) of salt (sodium) a day. If you have hypertension, you may need to reduce your sodium intake to 1,500 mg a day.  Limit alcohol intake to no more than 1 drink a day for nonpregnant women and 2 drinks a day for men. One drink equals 12 oz of beer, 5 oz of wine, or 1 oz of hard liquor.  Work with your health care provider to maintain a healthy body weight or to lose weight. Ask what an ideal weight is for you.  Get at least 30 minutes of exercise that causes your heart to beat faster (aerobic exercise) most days of the week. Activities may include walking, swimming, or biking.  Work with your health care provider or diet and nutrition specialist (dietitian) to adjust your eating plan to your individual calorie needs. Reading food labels  Check food labels for the amount of sodium per serving. Choose foods with less than 5 percent of the Daily Value of sodium. Generally, foods with less than 300 mg of sodium per serving fit into this eating plan.  To find whole grains, look for the word "whole" as the first word in the ingredient list. Shopping  Buy products labeled as "low-sodium" or "no salt added."  Buy fresh foods. Avoid canned foods and premade or frozen meals. Cooking  Avoid adding salt when cooking. Use salt-free seasonings or herbs instead of table salt or sea salt. Check with your health care provider or pharmacist before using salt substitutes.  Do not fry foods. Cook foods using healthy methods such as baking, boiling, grilling, and broiling instead.  Cook with  heart-healthy oils, such as olive, canola, soybean, or sunflower oil. Meal planning   Eat a balanced diet that includes: ? 5 or more servings of fruits and vegetables each day. At each meal, try to fill half of your plate with fruits and vegetables. ? Up to 6-8 servings of whole grains each day. ? Less than 6 oz of lean meat, poultry, or fish each day. A 3-oz serving of meat is about the same size as a deck of cards. One egg equals 1 oz. ? 2 servings of low-fat dairy each day. ? A serving of nuts, seeds, or beans 5 times each week. ? Heart-healthy fats. Healthy fats called Omega-3 fatty acids are found in foods such as flaxseeds and coldwater fish, like sardines, salmon, and mackerel.  Limit how much you eat of the following: ? Canned or prepackaged foods. ? Food that is high in trans fat, such as fried foods. ? Food that is high in saturated fat, such as fatty meat. ? Sweets, desserts, sugary drinks, and other foods with added sugar. ? Full-fat dairy products.  Do not salt foods before eating.  Try to eat at least 2 vegetarian meals each week.  Eat more home-cooked food and less restaurant, buffet, and fast food.  When eating at a restaurant, ask that your food be prepared with less salt or no salt, if possible. What foods are recommended? The items listed may not be a complete list. Talk with your dietitian about what   dietary choices are best for you. Grains Whole-grain or whole-wheat bread. Whole-grain or whole-wheat pasta. Brown rice. Oatmeal. Quinoa. Bulgur. Whole-grain and low-sodium cereals. Pita bread. Low-fat, low-sodium crackers. Whole-wheat flour tortillas. Vegetables Fresh or frozen vegetables (raw, steamed, roasted, or grilled). Low-sodium or reduced-sodium tomato and vegetable juice. Low-sodium or reduced-sodium tomato sauce and tomato paste. Low-sodium or reduced-sodium canned vegetables. Fruits All fresh, dried, or frozen fruit. Canned fruit in natural juice (without  added sugar). Meat and other protein foods Skinless chicken or turkey. Ground chicken or turkey. Pork with fat trimmed off. Fish and seafood. Egg whites. Dried beans, peas, or lentils. Unsalted nuts, nut butters, and seeds. Unsalted canned beans. Lean cuts of beef with fat trimmed off. Low-sodium, lean deli meat. Dairy Low-fat (1%) or fat-free (skim) milk. Fat-free, low-fat, or reduced-fat cheeses. Nonfat, low-sodium ricotta or cottage cheese. Low-fat or nonfat yogurt. Low-fat, low-sodium cheese. Fats and oils Soft margarine without trans fats. Vegetable oil. Low-fat, reduced-fat, or light mayonnaise and salad dressings (reduced-sodium). Canola, safflower, olive, soybean, and sunflower oils. Avocado. Seasoning and other foods Herbs. Spices. Seasoning mixes without salt. Unsalted popcorn and pretzels. Fat-free sweets. What foods are not recommended? The items listed may not be a complete list. Talk with your dietitian about what dietary choices are best for you. Grains Baked goods made with fat, such as croissants, muffins, or some breads. Dry pasta or rice meal packs. Vegetables Creamed or fried vegetables. Vegetables in a cheese sauce. Regular canned vegetables (not low-sodium or reduced-sodium). Regular canned tomato sauce and paste (not low-sodium or reduced-sodium). Regular tomato and vegetable juice (not low-sodium or reduced-sodium). Pickles. Olives. Fruits Canned fruit in a light or heavy syrup. Fried fruit. Fruit in cream or butter sauce. Meat and other protein foods Fatty cuts of meat. Ribs. Fried meat. Bacon. Sausage. Bologna and other processed lunch meats. Salami. Fatback. Hotdogs. Bratwurst. Salted nuts and seeds. Canned beans with added salt. Canned or smoked fish. Whole eggs or egg yolks. Chicken or turkey with skin. Dairy Whole or 2% milk, cream, and half-and-half. Whole or full-fat cream cheese. Whole-fat or sweetened yogurt. Full-fat cheese. Nondairy creamers. Whipped toppings.  Processed cheese and cheese spreads. Fats and oils Butter. Stick margarine. Lard. Shortening. Ghee. Bacon fat. Tropical oils, such as coconut, palm kernel, or palm oil. Seasoning and other foods Salted popcorn and pretzels. Onion salt, garlic salt, seasoned salt, table salt, and sea salt. Worcestershire sauce. Tartar sauce. Barbecue sauce. Teriyaki sauce. Soy sauce, including reduced-sodium. Steak sauce. Canned and packaged gravies. Fish sauce. Oyster sauce. Cocktail sauce. Horseradish that you find on the shelf. Ketchup. Mustard. Meat flavorings and tenderizers. Bouillon cubes. Hot sauce and Tabasco sauce. Premade or packaged marinades. Premade or packaged taco seasonings. Relishes. Regular salad dressings. Where to find more information:  National Heart, Lung, and Blood Institute: www.nhlbi.nih.gov  American Heart Association: www.heart.org Summary  The DASH eating plan is a healthy eating plan that has been shown to reduce high blood pressure (hypertension). It may also reduce your risk for type 2 diabetes, heart disease, and stroke.  With the DASH eating plan, you should limit salt (sodium) intake to 2,300 mg a day. If you have hypertension, you may need to reduce your sodium intake to 1,500 mg a day.  When on the DASH eating plan, aim to eat more fresh fruits and vegetables, whole grains, lean proteins, low-fat dairy, and heart-healthy fats.  Work with your health care provider or diet and nutrition specialist (dietitian) to adjust your eating plan to your individual   calorie needs. This information is not intended to replace advice given to you by your health care provider. Make sure you discuss any questions you have with your health care provider. Document Released: 04/02/2011 Document Revised: 04/06/2016 Document Reviewed: 04/06/2016 Elsevier Interactive Patient Education  2018 Elsevier Inc.  

## 2017-10-19 MED FILL — LEVOTHYROXINE 75 MCG TABLET: 75 | 90 days supply | Qty: 90 | Fill #2

## 2017-11-01 ENCOUNTER — Encounter: Payer: Self-pay | Admitting: Adult Health

## 2017-11-01 ENCOUNTER — Ambulatory Visit (INDEPENDENT_AMBULATORY_CARE_PROVIDER_SITE_OTHER): Payer: 59 | Admitting: Adult Health

## 2017-11-01 VITALS — BP 124/78 | HR 86 | Ht 62.0 in | Wt 145.2 lb

## 2017-11-01 DIAGNOSIS — L0292 Furuncle, unspecified: Secondary | ICD-10-CM

## 2017-11-01 MED ORDER — AMOXICILLIN-POT CLAVULANATE 875-125 MG PO TABS: 1.0000 | ORAL_TABLET | Freq: Two times a day (BID) | ORAL | 1 refills | Status: DC

## 2017-11-01 NOTE — Patient Instructions (Signed)
Skin Abscess A skin abscess is an infected area on or under your skin that contains pus and other material. An abscess can happen almost anywhere on your body. Some abscesses break open (rupture) on their own. Most continue to get worse unless they are treated. The infection can spread deeper into the body and into your blood, which can make you feel sick. Treatment usually involves draining the abscess. Follow these instructions at home: Abscess Care  If you have an abscess that has not drained, place a warm, clean, wet washcloth over the abscess several times a day. Do this as told by your doctor.  Follow instructions from your doctor about how to take care of your abscess. Make sure you: ? Cover the abscess with a bandage (dressing). ? Change your bandage or gauze as told by your doctor. ? Wash your hands with soap and water before you change the bandage or gauze. If you cannot use soap and water, use hand sanitizer.  Check your abscess every day for signs that the infection is getting worse. Check for: ? More redness, swelling, or pain. ? More fluid or blood. ? Warmth. ? More pus or a bad smell. Medicines   Take over-the-counter and prescription medicines only as told by your doctor.  If you were prescribed an antibiotic medicine, take it as told by your doctor. Do not stop taking the antibiotic even if you start to feel better. General instructions  To avoid spreading the infection: ? Do not share personal care items, towels, or hot tubs with others. ? Avoid making skin-to-skin contact with other people.  Keep all follow-up visits as told by your doctor. This is important. Contact a doctor if:  You have more redness, swelling, or pain around your abscess.  You have more fluid or blood coming from your abscess.  Your abscess feels warm when you touch it.  You have more pus or a bad smell coming from your abscess.  You have a fever.  Your muscles ache.  You have  chills.  You feel sick. Get help right away if:  You have very bad (severe) pain.  You see red streaks on your skin spreading away from the abscess. This information is not intended to replace advice given to you by your health care provider. Make sure you discuss any questions you have with your health care provider. Document Released: 09/30/2007 Document Revised: 12/08/2015 Document Reviewed: 02/20/2015 Elsevier Interactive Patient Education  2018 Elsevier Inc.  

## 2017-11-01 NOTE — Progress Notes (Signed)
  Subjective:     Patient ID: Melissa [name], female   DOB: 14-Jul-1955, 62 y.o.   MRN: [id number]  HPI Melissa [name] is a 62 year old white female in complaining of having had boil last week, is better now.   Review of Systems Had boil last week in vagina area, took 4 days of Augmentin and then it drained and seems to have gone.It was the size of a grape, and drained brown.   Reviewed past medical,surgical, social and family history. Reviewed medications and allergies.     Objective:   Physical Exam BP 124/78 (BP Location: Right Arm, Patient Position: Sitting, Cuff Size: Normal)   Pulse 86   Ht 5\' 2"  (1.575 m)   Wt 145 lb 3.2 oz (65.9 kg)   BMI 26.56 kg/m  Skin warm and dry.  Lungs: clear to ausculation bilaterally. Cardiovascular: regular rate and rhythm.Pelvic: external genitalia is normal in appearance no lesions, vagina: has resolving boil base left labia, pink, no drainage noted. Has bee stings left thigh, she keeps honey bees, and got honey yesterday.     Assessment:     Boil, resolving     Plan:     Meds ordered this encounter  Medications  . amoxicillin-clavulanate (AUGMENTIN) 875-125 MG tablet    Sig: Take 1 tablet by mouth 2 (two) times daily.    Dispense:  14 tablet    Refill:  1    Order Specific Question:   Supervising Provider    Answer:   Tania Ade H [[id number]]  F/U prn

## 2017-12-28 ENCOUNTER — Other Ambulatory Visit (HOSPITAL_COMMUNITY): Payer: 59

## 2017-12-30 ENCOUNTER — Inpatient Hospital Stay (HOSPITAL_COMMUNITY): Payer: 59 | Attending: Hematology

## 2017-12-30 DIAGNOSIS — C9001 Multiple myeloma in remission: Secondary | ICD-10-CM | POA: Diagnosis not present

## 2017-12-30 DIAGNOSIS — C9000 Multiple myeloma not having achieved remission: Secondary | ICD-10-CM

## 2017-12-30 LAB — COMPREHENSIVE METABOLIC PANEL
ALBUMIN: 4 g/dL (ref 3.5–5.0)
ALK PHOS: 52 U/L (ref 38–126)
ALT: 18 U/L (ref 0–44)
AST: 21 U/L (ref 15–41)
Anion gap: 8 (ref 5–15)
BUN: 16 mg/dL (ref 6–20)
CALCIUM: 9.1 mg/dL (ref 8.9–10.3)
CO2: 29 mmol/L (ref 22–32)
CREATININE: 0.86 mg/dL (ref 0.44–1.00)
Chloride: 105 mmol/L (ref 98–111)
GFR calc Af Amer: >60 mL/min (ref >60)
GFR calc non Af Amer: >60 mL/min (ref >60)
GLUCOSE: 67 mg/dL — AB (ref 70–99)
Potassium: 4.5 mmol/L (ref 3.5–5.1)
SODIUM: 142 mmol/L (ref 135–145)
Total Bilirubin: 0.6 mg/dL (ref 0.3–1.2)
Total Protein: 7.1 g/dL (ref 6.5–8.1)

## 2017-12-30 LAB — CBC WITH DIFFERENTIAL/PLATELET
BASOS PCT: 0 %
Basophils Absolute: 0 10*3/uL (ref 0.0–0.1)
EOS ABS: 0.2 10*3/uL (ref 0.0–0.7)
Eosinophils Relative: 3 %
HCT: 40.1 % (ref 36.0–46.0)
HEMOGLOBIN: 13.5 g/dL (ref 12.0–15.0)
LYMPHS ABS: 1.4 10*3/uL (ref 0.7–4.0)
Lymphocytes Relative: 29 %
MCH: 32.8 pg (ref 26.0–34.0)
MCHC: 33.7 g/dL (ref 30.0–36.0)
MCV: 97.3 fL (ref 78.0–100.0)
MONO ABS: 0.4 10*3/uL (ref 0.1–1.0)
MONOS PCT: 8 %
NEUTROS PCT: 60 %
Neutro Abs: 3 10*3/uL (ref 1.7–7.7)
Platelets: 209 10*3/uL (ref 150–400)
RBC: 4.12 MIL/uL (ref 3.87–5.11)
RDW: 12.7 % (ref 11.5–15.5)
WBC: 5 10*3/uL (ref 4.0–10.5)

## 2017-12-30 LAB — LACTATE DEHYDROGENASE: LDH: 140 U/L (ref 98–192)

## 2017-12-31 ENCOUNTER — Inpatient Hospital Stay (HOSPITAL_BASED_OUTPATIENT_CLINIC_OR_DEPARTMENT_OTHER): Payer: 59 | Admitting: Hematology

## 2017-12-31 ENCOUNTER — Encounter (HOSPITAL_COMMUNITY): Payer: Self-pay | Admitting: Hematology

## 2017-12-31 VITALS — BP 117/64 | HR 88 | Temp 97.7°F | Resp 14 | Wt 146.6 lb

## 2017-12-31 DIAGNOSIS — C9000 Multiple myeloma not having achieved remission: Secondary | ICD-10-CM

## 2017-12-31 DIAGNOSIS — C9001 Multiple myeloma in remission: Secondary | ICD-10-CM | POA: Diagnosis not present

## 2017-12-31 LAB — IGG, IGA, IGM
IGA: 181 mg/dL (ref 87–352)
IGG (IMMUNOGLOBIN G), SERUM: 928 mg/dL (ref 700–1600)
IgM (Immunoglobulin M), Srm: 55 mg/dL (ref 26–217)

## 2017-12-31 LAB — KAPPA/LAMBDA LIGHT CHAINS
KAPPA, LAMDA LIGHT CHAIN RATIO: 1.22 (ref 0.26–1.65)
Kappa free light chain: 16 mg/L (ref 3.3–19.4)
Lambda free light chains: 13.1 mg/L (ref 5.7–26.3)

## 2017-12-31 LAB — BETA 2 MICROGLOBULIN, SERUM: Beta-2 Microglobulin: 1.7 mg/L (ref 0.6–2.4)

## 2017-12-31 NOTE — Assessment & Plan Note (Signed)
1.  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 -Placed on Revlimid maintenance, dose reduced to 5 mg 1 week on, one-week off, discontinued in October 2014 secondary to intolerance including nausea and low blood counts. - Zometa was discontinued in August 2014. - Bone marrow biopsy on 04/07/2017 at [facility] was negative for any residual myeloma. -She was seen by Dr. Norma Fredrickson in June 2019. - I discussed the results from 12/30/2017 which were grossly within normal limits.  Mineral globulin levels were also within normal limits. -SPEP and immunofixation and free light chain ratio from June 2019 were within normal limits.  We will see her back in 6 months for follow-up.  She sees Dr. Norma Fredrickson every 6 months.  We will make sure that she has follow-up visits every 3 months with repeat labs.

## 2017-12-31 NOTE — Progress Notes (Signed)
[facility] [place], [place] [address]   CLINIC:  Medical Oncology/Hematology  PCP:  Kathyrn Drown, MD [address] [place] [address] [phone]   REASON FOR VISIT: Follow-up for multiple myeloma  CURRENT THERAPY: Observation   INTERVAL HISTORY:  Melissa [name] 62 y.o. female returns for routine follow-up for multiple myeloma. Patient is here today with her husband. She is doing very well. She has been in remission for 10 years now and has done great with her transplant. She does report numbness in her toes however this is stable at this time. She has no other issues. She reports her appetite and energy level are at 100%. She is maintaining her weight well. She denies any night sweats, fevers, or chills. Denies any nausea, vomiting, or diarrhea. Denies any new pains.   REVIEW OF SYSTEMS:  Review of Systems  Neurological: Positive for numbness (feet).  All other systems reviewed and are negative.    PAST MEDICAL/SURGICAL HISTORY:  Past Medical History:  Diagnosis Date  . History of stem cell transplant ([facility])    for multiple myeloma  . Hypothyroidism   . Multiple myeloma   . Peripheral neuropathy   . Seasonal allergies   . Thyroid disease    Past Surgical History:  Procedure Laterality Date  . CATARACT EXTRACTION W/PHACO Right 04/04/2013   Procedure: CATARACT EXTRACTION PHACO AND INTRAOCULAR LENS PLACEMENT (IOC);  Surgeon: Elta Guadeloupe T. Gershon Crane, MD;  Location: [facility];  Service: Ophthalmology;  Laterality: Right;  CDE:3.42  . CATARACT EXTRACTION W/PHACO Left 04/18/2013   Procedure: CATARACT EXTRACTION PHACO AND INTRAOCULAR LENS PLACEMENT (IOC);  Surgeon: Elta Guadeloupe T. Gershon Crane, MD;  Location: [facility];  Service: Ophthalmology;  Laterality: Left;  CDE:4.91  . CESAREAN SECTION  06/1993  . FASCIECTOMY Left 03/02/2017   Procedure: FASCIECTOMY, LEFT SMALL FINGER;  Surgeon: Daryll Brod, MD;  Location: [facility];  Service: Orthopedics;   Laterality: Left;  . HAND SURGERY Left   . TONSILECTOMY, ADENOIDECTOMY, BILATERAL MYRINGOTOMY AND TUBES    . TONSILLECTOMY    . TUBAL LIGATION    . YAG LASER APPLICATION Right 12/28/4095   Procedure: YAG LASER APPLICATION;  Surgeon: Rutherford Guys, MD;  Location: [facility];  Service: Ophthalmology;  Laterality: Right;  . YAG LASER APPLICATION Left 3/53/2992   Procedure: YAG LASER APPLICATION;  Surgeon: Rutherford Guys, MD;  Location: [facility];  Service: Ophthalmology;  Laterality: Left;     SOCIAL HISTORY:  Social History   Socioeconomic History  . Marital status: Married    Spouse name: Not on file  . Number of children: Not on file  . Years of education: Not on file  . Highest education level: Not on file  Occupational History  . Not on file  Social Needs  . Financial resource strain: Not on file  . Food insecurity:    Worry: Not on file    Inability: Not on file  . Transportation needs:    Medical: Not on file    Non-medical: Not on file  Tobacco Use  . Smoking status: Never Smoker  . Smokeless tobacco: Never Used  Substance and Sexual Activity  . Alcohol use: Yes    Comment: occ  . Drug use: No  . Sexual activity: Not Currently    Birth control/protection: Post-menopausal  Lifestyle  . Physical activity:    Days per week: Not on file    Minutes per session: Not on file  . Stress: Not on  file  Relationships  . Social connections:    Talks on phone: Not on file    Gets together: Not on file    Attends religious service: Not on file    Active member of club or organization: Not on file    Attends meetings of clubs or organizations: Not on file    Relationship status: Not on file  . Intimate partner violence:    Fear of current or ex partner: Not on file    Emotionally abused: Not on file    Physically abused: Not on file    Forced sexual activity: Not on file  Other Topics Concern  . Not on file  Social History Narrative  . Not on file    FAMILY HISTORY:  Family  History  Problem Relation Age of Onset  . Hypertension Mother   . Heart attack Mother   . Diabetes Mother   . Skin cancer Mother   . Hypertension Father   . Heart attack Father   . Skin cancer Father   . Heart disease Maternal Grandmother   . Stroke Maternal Grandfather   . Hypertension Sister   . Skin cancer Sister   . Hypertension Brother   . Skin cancer Brother   . Hypertension Brother   . Cancer Brother        melanoma  . Skin cancer Brother   . Factor V Leiden deficiency Daughter     CURRENT MEDICATIONS:  Outpatient Encounter Medications as of 12/31/2017  Medication Sig  . Calcium Carbonate-Vitamin D (CALCIUM + D PO) Take 1 tablet by mouth daily.  . Cholecalciferol (VITAMIN D) 2000 UNITS tablet Take 2,000 Units by mouth daily.  Marland Kitchen ibuprofen (ADVIL,MOTRIN) 200 MG tablet Take 200 mg by mouth as needed.  Marland Kitchen levothyroxine (SYNTHROID, LEVOTHROID) 75 MCG tablet Take 1 tablet (75 mcg total) by mouth daily.  . [DISCONTINUED] amoxicillin-clavulanate (AUGMENTIN) 875-125 MG tablet Take 1 tablet by mouth 2 (two) times daily.  . [DISCONTINUED] loratadine (CLARITIN) 10 MG tablet Take 10 mg by mouth as needed.    Facility-Administered Encounter Medications as of 12/31/2017  Medication  . 0.9 %  sodium chloride infusion  . 0.9 %  sodium chloride infusion  . sodium chloride 0.9 % injection 10 mL    ALLERGIES:  Allergies  Allergen Reactions  . Hydromorphone Hcl Nausea And Vomiting  . Poison Ivy Extract [Poison Ivy Extract]   . Tape Rash     PHYSICAL EXAM:  ECOG Performance status: 1  Vitals:   12/31/17 0827  BP: 117/64  Pulse: 88  Resp: 14  Temp: 97.7 F (36.5 C)  SpO2: 100%   Filed Weights   12/31/17 0827  Weight: 146 lb 9.6 oz (66.5 kg)    Physical Exam  Constitutional: She is oriented to person, place, and time. She appears well-developed and well-nourished.  Neck: Normal range of motion. Neck supple.  Cardiovascular: Normal rate, regular rhythm and normal heart  sounds.  Pulmonary/Chest: Effort normal and breath sounds normal.  Abdominal: Soft.  Musculoskeletal: Normal range of motion.  Neurological: She is alert and oriented to person, place, and time.  Skin: Skin is warm and dry.  Psychiatric: She has a normal mood and affect. Her behavior is normal. Judgment and thought content normal.  Abdomen: Soft, nontender with no palpable organomegaly.   LABORATORY DATA:  I have reviewed the labs as listed.  CBC    Component Value Date/Time   WBC 5.0 12/30/2017 0840   RBC 4.12 12/30/2017  0840   HGB 13.5 12/30/2017 0840   HCT 40.1 12/30/2017 0840   PLT 209 12/30/2017 0840   MCV 97.3 12/30/2017 0840   MCH 32.8 12/30/2017 0840   MCHC 33.7 12/30/2017 0840   RDW 12.7 12/30/2017 0840   LYMPHSABS 1.4 12/30/2017 0840   MONOABS 0.4 12/30/2017 0840   EOSABS 0.2 12/30/2017 0840   BASOSABS 0.0 12/30/2017 0840   CMP Latest Ref Rng & Units 12/30/2017 09/29/2017 07/02/2017  Glucose 70 - 99 mg/dL 67(L) 97 76  BUN 6 - 20 mg/dL 16 22(H) 20  Creatinine 0.44 - 1.00 mg/dL 0.86 0.91 0.90  Sodium 135 - 145 mmol/L 142 139 138  Potassium 3.5 - 5.1 mmol/L 4.5 4.4 4.1  Chloride 98 - 111 mmol/L 105 104 101  CO2 22 - 32 mmol/L _0 Calcium 8.9 - 10.3 mg/dL 9.1 9.4 9.5  Total Protein 6.5 - 8.1 g/dL 7.1 7.1 7.4  Total Bilirubin 0.3 - 1.2 mg/dL 0.6 0.5 0.7  Alkaline Phos 38 - 126 U/L 52 49 53  AST 15 - 41 U/L _1 ALT 0 - 44 U/L _2 ASSESSMENT & PLAN:   Multiple myeloma 1.  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 -Placed on Revlimid maintenance, dose reduced to 5 mg 1 week on, one-week off, discontinued in October 2014 secondary to intolerance including nausea and low blood counts. - Zometa was discontinued in August 2014. - [facility] marrow [facility] on 04/07/2017 [facility] was negative for any residual myeloma. -She was seen by Dr. Norma Fredrickson in  June 2019. - I discussed the results from 12/30/2017 which were grossly within normal limits.  Mineral globulin levels were also within normal limits. -SPEP and immunofixation and free light chain ratio from June 2019 were within normal limits.  We will see her back in 6 months for follow-up.  She sees Dr. Norma Fredrickson every 6 months.  We will make sure that she has follow-up visits every 3 months with repeat labs.      Orders placed this encounter:  Orders Placed This Encounter  Procedures  . Protein electrophoresis, serum  . Immunofixation electrophoresis  . Kappa/lambda light chains  . Lactate dehydrogenase  . CBC with Differential/Platelet  . Comprehensive metabolic panel  . Beta 2 microglobulin, serum  . Beta 2 microglobulin, serum  . Protein electrophoresis, serum  . Immunofixation electrophoresis  . Kappa/lambda light chains  . CBC with Differential/Platelet  . Comprehensive metabolic panel  . Lactate dehydrogenase      Derek Jack, MD [place] [phone]

## 2017-12-31 NOTE — Patient Instructions (Addendum)
[place] at [facility] Discharge Instructions  You saw Dr. Delton Coombes today. Follow up in 6 months with [place] with labs a few days prior. Come have your labs drawn in 3 months a few days prior to your [facility] apointment  Thank you for choosing [place] at [facility] to provide your oncology and hematology care.  To afford each patient quality time with our provider, please arrive at least 15 minutes before your scheduled appointment time.   If you have a lab appointment with the [facility] please come in thru the  Main Entrance and check in at the main information desk  You need to re-schedule your appointment should you arrive 10 or more minutes late.  We strive to give you quality time with our providers, and arriving late affects you and other patients whose appointments are after yours.  Also, if you no show three or more times for appointments you may be dismissed from the clinic at the providers discretion.     Again, thank you for choosing [facility].  Our hope is that these requests will decrease the amount of time that you wait before being seen by our physicians.       _____________________________________________________________  Should you have questions after your visit to [facility], please contact our office at [phone] between the hours of 8:00 a.m. and 4:30 p.m.  Voicemails left after 4:00 p.m. will not be returned until the following business day.  For prescription refill requests, have your pharmacy contact our office and allow 72 hours.    Cancer Center Support Programs:   > Cancer Support Group  2nd Tuesday of the month 1pm-2pm, Journey Room

## 2018-01-03 LAB — PROTEIN ELECTROPHORESIS, SERUM
A/G Ratio: 1.4 (ref 0.7–1.7)
ALBUMIN ELP: 3.9 g/dL (ref 2.9–4.4)
ALPHA-1-GLOBULIN: 0.1 g/dL (ref 0.0–0.4)
Alpha-2-Globulin: 0.8 g/dL (ref 0.4–1.0)
BETA GLOBULIN: 0.9 g/dL (ref 0.7–1.3)
GAMMA GLOBULIN: 0.9 g/dL (ref 0.4–1.8)
Globulin, Total: 2.8 g/dL (ref 2.2–3.9)
TOTAL PROTEIN ELP: 6.7 g/dL (ref 6.0–8.5)

## 2018-01-07 ENCOUNTER — Other Ambulatory Visit (HOSPITAL_COMMUNITY): Payer: 59

## 2018-01-11 ENCOUNTER — Ambulatory Visit (HOSPITAL_COMMUNITY): Payer: 59 | Admitting: Hematology

## 2018-01-11 ENCOUNTER — Ambulatory Visit (HOSPITAL_COMMUNITY): Payer: 59 | Admitting: Adult Health

## 2018-01-24 MED FILL — LEVOTHYROXINE 75 MCG TABLET: 75 | 90 days supply | Qty: 90 | Fill #3

## 2018-04-01 ENCOUNTER — Other Ambulatory Visit (HOSPITAL_COMMUNITY): Payer: 59

## 2018-04-04 ENCOUNTER — Encounter: Payer: Self-pay | Admitting: Family Medicine

## 2018-04-04 ENCOUNTER — Ambulatory Visit (INDEPENDENT_AMBULATORY_CARE_PROVIDER_SITE_OTHER): Payer: 59 | Admitting: Family Medicine

## 2018-04-04 VITALS — BP 132/84 | Temp 97.4°F | Wt 146.1 lb

## 2018-04-04 DIAGNOSIS — J019 Acute sinusitis, unspecified: Secondary | ICD-10-CM

## 2018-04-04 DIAGNOSIS — E039 Hypothyroidism, unspecified: Secondary | ICD-10-CM | POA: Diagnosis not present

## 2018-04-04 DIAGNOSIS — E7849 Other hyperlipidemia: Secondary | ICD-10-CM

## 2018-04-04 DIAGNOSIS — B9689 Other specified bacterial agents as the cause of diseases classified elsewhere: Secondary | ICD-10-CM

## 2018-04-04 MED ORDER — LEVOTHYROXINE SODIUM 75 MCG PO TABS: 75.0000 ug | ORAL_TABLET | Freq: Every day | ORAL | 3 refills | Status: DC

## 2018-04-04 MED ORDER — AMOXICILLIN-POT CLAVULANATE 875-125 MG PO TABS: 1.0000 | ORAL_TABLET | Freq: Two times a day (BID) | ORAL | 0 refills | Status: DC

## 2018-04-04 NOTE — Progress Notes (Signed)
   Subjective:    Patient ID: Melissa [name], female    DOB: [birth year], 62 y.o.   MRN: [id number]  HPI Patient is here today with complaints of right ear pain,some sore throat,sinus drainage,some congestion,a nagging cough. She states this had started around three weeks ago. She treated herself last week with amoxicillin 825 mg that she had had on hand from an abcessed tooth,she says this helped some,but now it is coming back.She has been using Claritin and robitussin which has helped some. She did treat herself with Augmentin for approximately 7 days but then she had relapse of her symptoms with increased congestion coughing sinus pressure  She also has hypothyroidism needs a refill on her medicine does a good job taking it is due for lab work  Review of Systems  Constitutional: Negative for activity change and fever.  HENT: Positive for congestion and rhinorrhea. Negative for ear pain.   Eyes: Negative for discharge.  Respiratory: Positive for cough. Negative for shortness of breath and wheezing.   Cardiovascular: Negative for chest pain.       Objective:   Physical Exam  Constitutional: She appears well-developed.  HENT:  Head: Normocephalic.  Nose: Nose normal.  Mouth/Throat: Oropharynx is clear and moist. No oropharyngeal exudate.  Neck: Neck supple.  Cardiovascular: Normal rate and normal heart sounds.  No murmur heard. Pulmonary/Chest: Effort normal and breath sounds normal. She has no wheezes.  Lymphadenopathy:    She has no cervical adenopathy.  Skin: Skin is warm and dry.  Nursing note and vitals reviewed.         Assessment & Plan:  Hypothyroidism check lab work continue medication refill sent in follow-up in 1 year  Acute rhinosinusitis antibiotic prescribed warning signs discussed follow-up if ongoing troubles  History of hyperlipidemia check lipid profile await the results

## 2018-04-06 MED FILL — LEVOTHYROXINE 75 MCG TABLET: 75 | 90 days supply | Qty: 90 | Fill #0

## 2018-04-11 ENCOUNTER — Ambulatory Visit: Payer: 59 | Admitting: Family Medicine

## 2018-04-18 ENCOUNTER — Encounter (HOSPITAL_COMMUNITY): Payer: Self-pay

## 2018-04-18 ENCOUNTER — Other Ambulatory Visit (HOSPITAL_COMMUNITY)
Admission: RE | Admit: 2018-04-18 | Discharge: 2018-04-18 | Disposition: A | Discharge status: Home / Self Care | Payer: 59 | Source: Ambulatory Visit | Attending: Family Medicine | Admitting: Family Medicine

## 2018-04-18 ENCOUNTER — Inpatient Hospital Stay (HOSPITAL_COMMUNITY): Payer: 59

## 2018-04-18 ENCOUNTER — Inpatient Hospital Stay (HOSPITAL_COMMUNITY): Payer: 59 | Attending: Hematology

## 2018-04-18 DIAGNOSIS — E039 Hypothyroidism, unspecified: Secondary | ICD-10-CM | POA: Insufficient documentation

## 2018-04-18 DIAGNOSIS — C9000 Multiple myeloma not having achieved remission: Secondary | ICD-10-CM | POA: Diagnosis not present

## 2018-04-18 DIAGNOSIS — C9001 Multiple myeloma in remission: Secondary | ICD-10-CM

## 2018-04-18 DIAGNOSIS — E7849 Other hyperlipidemia: Secondary | ICD-10-CM | POA: Insufficient documentation

## 2018-04-18 LAB — COMPREHENSIVE METABOLIC PANEL
ALK PHOS: 46 U/L (ref 38–126)
ALT: 19 U/L (ref 0–44)
AST: 22 U/L (ref 15–41)
Albumin: 4.1 g/dL (ref 3.5–5.0)
Anion gap: 5 (ref 5–15)
BUN: 20 mg/dL (ref 6–20)
CALCIUM: 9.5 mg/dL (ref 8.9–10.3)
CO2: 29 mmol/L (ref 22–32)
CREATININE: 0.88 mg/dL (ref 0.44–1.00)
Chloride: 105 mmol/L (ref 98–111)
Glucose, Bld: 99 mg/dL (ref 70–99)
Potassium: 4 mmol/L (ref 3.5–5.1)
Sodium: 139 mmol/L (ref 135–145)
Total Bilirubin: 0.7 mg/dL (ref 0.3–1.2)
Total Protein: 7.2 g/dL (ref 6.5–8.1)

## 2018-04-18 LAB — LIPID PANEL
CHOL/HDL RATIO: 3.2 ratio
Cholesterol: 184 mg/dL (ref 0–200)
HDL: 57 mg/dL (ref >40)
LDL CALC: 107 mg/dL — AB (ref 0–99)
Triglycerides: 98 mg/dL (ref <150)
VLDL: 20 mg/dL (ref 0–40)

## 2018-04-18 LAB — CBC WITH DIFFERENTIAL/PLATELET
Abs Immature Granulocytes: 0.02 10*3/uL (ref 0.00–0.07)
BASOS ABS: 0 10*3/uL (ref 0.0–0.1)
Basophils Relative: 0 %
EOS ABS: 0.2 10*3/uL (ref 0.0–0.5)
EOS PCT: 3 %
HCT: 41.8 % (ref 36.0–46.0)
Hemoglobin: 13.6 g/dL (ref 12.0–15.0)
Immature Granulocytes: 0 %
Lymphocytes Relative: 31 %
Lymphs Abs: 1.6 10*3/uL (ref 0.7–4.0)
MCH: 32.2 pg (ref 26.0–34.0)
MCHC: 32.5 g/dL (ref 30.0–36.0)
MCV: 99.1 fL (ref 80.0–100.0)
Monocytes Absolute: 0.5 10*3/uL (ref 0.1–1.0)
Monocytes Relative: 9 %
NRBC: 0 % (ref 0.0–0.2)
Neutro Abs: 3 10*3/uL (ref 1.7–7.7)
Neutrophils Relative %: 57 %
Platelets: 228 10*3/uL (ref 150–400)
RBC: 4.22 MIL/uL (ref 3.87–5.11)
RDW: 12.8 % (ref 11.5–15.5)
WBC: 5.2 10*3/uL (ref 4.0–10.5)

## 2018-04-18 LAB — TSH: TSH: 0.634 u[IU]/mL (ref 0.350–4.500)

## 2018-04-18 LAB — LACTATE DEHYDROGENASE: LDH: 135 U/L (ref 98–192)

## 2018-04-19 LAB — PROTEIN ELECTROPHORESIS, SERUM
A/G Ratio: 1.3 (ref 0.7–1.7)
ALBUMIN ELP: 3.8 g/dL (ref 2.9–4.4)
ALPHA-1-GLOBULIN: 0.1 g/dL (ref 0.0–0.4)
Alpha-2-Globulin: 0.8 g/dL (ref 0.4–1.0)
BETA GLOBULIN: 1 g/dL (ref 0.7–1.3)
GAMMA GLOBULIN: 1.1 g/dL (ref 0.4–1.8)
Globulin, Total: 3 g/dL (ref 2.2–3.9)
Total Protein ELP: 6.8 g/dL (ref 6.0–8.5)

## 2018-04-19 LAB — KAPPA/LAMBDA LIGHT CHAINS
KAPPA FREE LGHT CHN: 16.6 mg/L (ref 3.3–19.4)
Kappa, lambda light chain ratio: 1.07 (ref 0.26–1.65)
Lambda free light chains: 15.5 mg/L (ref 5.7–26.3)

## 2018-04-19 LAB — BETA 2 MICROGLOBULIN, SERUM: BETA 2 MICROGLOBULIN: 2.1 mg/L (ref 0.6–2.4)

## 2018-04-22 LAB — IMMUNOFIXATION ELECTROPHORESIS
IGM (IMMUNOGLOBULIN M), SRM: 57 mg/dL (ref 26–217)
IgA: 201 mg/dL (ref 87–352)
IgG (Immunoglobin G), Serum: 1106 mg/dL (ref 700–1600)
TOTAL PROTEIN ELP: 7 g/dL (ref 6.0–8.5)

## 2018-04-25 ENCOUNTER — Telehealth: Payer: Self-pay | Admitting: Family Medicine

## 2018-04-25 ENCOUNTER — Telehealth (HOSPITAL_COMMUNITY): Payer: Self-pay | Admitting: Emergency Medicine

## 2018-04-25 MED ORDER — DOXYCYCLINE HYCLATE 100 MG PO TABS: 100.0000 mg | ORAL_TABLET | Freq: Two times a day (BID) | ORAL | 0 refills | Status: DC

## 2018-04-25 MED FILL — DOXYCYCLINE HYCLATE 100 MG: 100 | 10 days supply | Qty: 20 | Fill #0

## 2018-04-25 NOTE — Telephone Encounter (Signed)
Patient called stating she is having right hip pain with a onset of 3weeks ago. It has progressively gotten worse since then. It is a aching pain that increases at night. She rates this pain at 4 of 10. She did inform her PCP who instructed her to bring it to our attention to be addressed. Randi, NP made aware.

## 2018-04-25 NOTE — Telephone Encounter (Signed)
I called and left a vm to r/c. 

## 2018-04-25 NOTE — Telephone Encounter (Signed)
I would recommend doxycycline 100 mg 1 twice daily take with a snack in a glass of water for 10 days #20 follow-up if any ongoing troubles

## 2018-04-25 NOTE — Telephone Encounter (Signed)
Patient states she has not been able to check her temp,but had chills and after taking Advil those resolved.She states her cough is a dry cough, no other symptoms. Please advise.

## 2018-04-25 NOTE — Telephone Encounter (Signed)
Nurses Please discussed with the patient I can do 1 of either If the patient would rather be checked I could see her at the end of today or tomorrow morning If the patient would rather have me send in the medication we can do so

## 2018-04-25 NOTE — Telephone Encounter (Signed)
Patient was seen on 12/9 with cough and congestion and now she has chills and she states cough and congestion is worst and wanting something called in or will she need to come in. [facility]

## 2018-04-25 NOTE — Telephone Encounter (Signed)
Medication sent in and pt is aware  

## 2018-04-25 NOTE — Telephone Encounter (Signed)
Patient states she would prefer you to send in medication to [place].She asked that it be something different than the augmentin.

## 2018-04-26 ENCOUNTER — Other Ambulatory Visit (HOSPITAL_COMMUNITY): Payer: Self-pay | Admitting: Nurse Practitioner

## 2018-04-26 DIAGNOSIS — C9001 Multiple myeloma in remission: Secondary | ICD-10-CM

## 2018-04-26 DIAGNOSIS — C9000 Multiple myeloma not having achieved remission: Secondary | ICD-10-CM

## 2018-04-29 ENCOUNTER — Ambulatory Visit (HOSPITAL_COMMUNITY)
Admission: RE | Admit: 2018-04-29 | Discharge: 2018-04-29 | Disposition: A | Discharge status: Home / Self Care | Payer: 59 | Source: Ambulatory Visit | Attending: Nurse Practitioner | Admitting: Nurse Practitioner

## 2018-04-29 ENCOUNTER — Other Ambulatory Visit (HOSPITAL_COMMUNITY): Payer: Self-pay | Admitting: Nurse Practitioner

## 2018-04-29 DIAGNOSIS — M25551 Pain in right hip: Secondary | ICD-10-CM | POA: Diagnosis not present

## 2018-04-29 DIAGNOSIS — C9001 Multiple myeloma in remission: Secondary | ICD-10-CM

## 2018-05-02 ENCOUNTER — Inpatient Hospital Stay (HOSPITAL_COMMUNITY): Payer: 59 | Attending: Hematology | Admitting: Hematology

## 2018-05-02 ENCOUNTER — Encounter (HOSPITAL_COMMUNITY): Payer: Self-pay | Admitting: Hematology

## 2018-05-02 ENCOUNTER — Other Ambulatory Visit: Payer: Self-pay

## 2018-05-02 VITALS — BP 133/65 | HR 94 | Temp 98.0°F | Resp 18 | Wt 147.5 lb

## 2018-05-02 DIAGNOSIS — M25551 Pain in right hip: Secondary | ICD-10-CM | POA: Diagnosis not present

## 2018-05-02 DIAGNOSIS — Z79899 Other long term (current) drug therapy: Secondary | ICD-10-CM | POA: Diagnosis not present

## 2018-05-02 DIAGNOSIS — Z9484 Stem cells transplant status: Secondary | ICD-10-CM | POA: Diagnosis not present

## 2018-05-02 DIAGNOSIS — Z9221 Personal history of antineoplastic chemotherapy: Secondary | ICD-10-CM | POA: Diagnosis not present

## 2018-05-02 DIAGNOSIS — C9000 Multiple myeloma not having achieved remission: Secondary | ICD-10-CM

## 2018-05-02 DIAGNOSIS — C9001 Multiple myeloma in remission: Secondary | ICD-10-CM | POA: Insufficient documentation

## 2018-05-02 NOTE — Assessment & Plan Note (Signed)
1.  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 -Placed on Revlimid maintenance, dose reduced to 5 mg 1 week on, one-week off, discontinued in October 2014 secondary to intolerance including nausea and low blood counts. - Zometa was discontinued in August 2014. - Bone marrow biopsy on 04/07/2017 at [facility] was negative for any residual myeloma. -She was seen by Dr. Norma Fredrickson in June 2019. - She complained of right hip pain for the last 6 weeks.  Hip x-ray was ordered and done on 04/29/2018. - I reviewed the results which were negative. -We have also reviewed her blood work from 2 weeks ago which showed normal SPEP, free light chains and immunofixation.  CBC and CMP were also within normal limits.  Normal calcium level. - Because of her history, I have recommended MRI of the right hip with and without gadolinium.  This will also give [place] a better visualization of soft tissues around the hip. - MRI report will be relayed over the phone.  We will see her back in 6 months with repeat blood work.  She will have myeloma panel done every 3 months.

## 2018-05-02 NOTE — Patient Instructions (Signed)
[place] at [facility] Discharge Instructions  You were seen today by Dr. Delton Coombes, he went over your recent lab and xray results. Those were normal. Dr. Raliegh Ip would like for you to have an MRI with contrast. He went over diagnosis and things that could cause the pain you're having. We will call you with the results of your MRI.     Thank you for choosing [place] at [facility] to provide your oncology and hematology care.  To afford each patient quality time with our provider, please arrive at least 15 minutes before your scheduled appointment time.   If you have a lab appointment with the [facility] please come in thru the  Main Entrance and check in at the main information desk  You need to re-schedule your appointment should you arrive 10 or more minutes late.  We strive to give you quality time with our providers, and arriving late affects you and other patients whose appointments are after yours.  Also, if you no show three or more times for appointments you may be dismissed from the clinic at the providers discretion.     Again, thank you for choosing [facility].  Our hope is that these requests will decrease the amount of time that you wait before being seen by our physicians.       _____________________________________________________________  Should you have questions after your visit to [facility], please contact our office at [phone] between the hours of 8:00 a.m. and 4:30 p.m.  Voicemails left after 4:00 p.m. will not be returned until the following business day.  For prescription refill requests, have your pharmacy contact our office and allow 72 hours.    Cancer Center Support Programs:   > Cancer Support Group  2nd Tuesday of the month 1pm-2pm, Journey Room

## 2018-05-02 NOTE — Progress Notes (Signed)
Melissa [name], [place] [address]   CLINIC:  Medical Oncology/Hematology  PCP:  Kathyrn Drown, MD [address] [place] [address] [phone]   REASON FOR VISIT: Follow-up for multiple myeloma  CURRENT THERAPY: Observation   INTERVAL HISTORY:  Melissa [name] 63 y.o. female returns for follow-up of her right hip pain.  She was complaining of right posterior hip pain for the last 6 weeks.  A hip x-ray was ordered along with her myeloma blood work.  She reports more pain at nighttime.  She does have minor pain during the daytime.  No restriction of joint movements.  Denies any fevers, night sweats or weight loss.  No other pains were reported.  REVIEW OF SYSTEMS:  Review of Systems  Musculoskeletal: Positive for arthralgias.  Neurological: Negative for numbness (feet).  All other systems reviewed and are negative.    PAST MEDICAL/SURGICAL HISTORY:  Past Medical History:  Diagnosis Date  . History of stem cell transplant ([facility])    for multiple myeloma  . Hypothyroidism   . Multiple myeloma   . Peripheral neuropathy   . Seasonal allergies   . Thyroid disease    Past Surgical History:  Procedure Laterality Date  . CATARACT EXTRACTION W/PHACO Right 04/04/2013   Procedure: CATARACT EXTRACTION PHACO AND INTRAOCULAR LENS PLACEMENT (IOC);  Surgeon: Elta Guadeloupe T. Gershon Crane, MD;  Location: [facility];  Service: Ophthalmology;  Laterality: Right;  CDE:3.42  . CATARACT EXTRACTION W/PHACO Left 04/18/2013   Procedure: CATARACT EXTRACTION PHACO AND INTRAOCULAR LENS PLACEMENT (IOC);  Surgeon: Elta Guadeloupe T. Gershon Crane, MD;  Location: [facility];  Service: Ophthalmology;  Laterality: Left;  CDE:4.91  . CESAREAN SECTION  06/1993  . FASCIECTOMY Left 03/02/2017   Procedure: FASCIECTOMY, LEFT SMALL FINGER;  Surgeon: Daryll Brod, MD;  Location: [facility];  Service: Orthopedics;  Laterality: Left;  . HAND SURGERY Left   . TONSILECTOMY, ADENOIDECTOMY,  BILATERAL MYRINGOTOMY AND TUBES    . TONSILLECTOMY    . TUBAL LIGATION    . YAG LASER APPLICATION Right 6/38/7564   Procedure: YAG LASER APPLICATION;  Surgeon: Rutherford Guys, MD;  Location: [facility];  Service: Ophthalmology;  Laterality: Right;  . YAG LASER APPLICATION Left 3/32/9518   Procedure: YAG LASER APPLICATION;  Surgeon: Rutherford Guys, MD;  Location: [facility];  Service: Ophthalmology;  Laterality: Left;     SOCIAL HISTORY:  Social History   Socioeconomic History  . Marital status: Married    Spouse name: Not on file  . Number of children: Not on file  . Years of education: Not on file  . Highest education level: Not on file  Occupational History  . Not on file  Social Needs  . Financial resource strain: Not on file  . Food insecurity:    Worry: Not on file    Inability: Not on file  . Transportation needs:    Medical: Not on file    Non-medical: Not on file  Tobacco Use  . Smoking status: Never Smoker  . Smokeless tobacco: Never Used  Substance and Sexual Activity  . Alcohol use: Yes    Comment: occ  . Drug use: No  . Sexual activity: Not Currently    Birth control/protection: Post-menopausal  Lifestyle  . Physical activity:    Days per week: Not on file    Minutes per session: Not on file  . Stress: Not on file  Relationships  . Social connections:    Talks on phone:  Not on file    Gets together: Not on file    Attends religious service: Not on file    Active member of club or organization: Not on file    Attends meetings of clubs or organizations: Not on file    Relationship status: Not on file  . Intimate partner violence:    Fear of current or ex partner: Not on file    Emotionally abused: Not on file    Physically abused: Not on file    Forced sexual activity: Not on file  Other Topics Concern  . Not on file  Social History Narrative  . Not on file    FAMILY HISTORY:  Family History  Problem Relation Age of Onset  . Hypertension Mother   .  Heart attack Mother   . Diabetes Mother   . Skin cancer Mother   . Hypertension Father   . Heart attack Father   . Skin cancer Father   . Heart disease Maternal Grandmother   . Stroke Maternal Grandfather   . Hypertension Sister   . Skin cancer Sister   . Hypertension Brother   . Skin cancer Brother   . Hypertension Brother   . Cancer Brother        melanoma  . Skin cancer Brother   . Factor V Leiden deficiency Daughter     CURRENT MEDICATIONS:  Outpatient Encounter Medications as of 05/02/2018  Medication Sig  . Calcium Carbonate-Vitamin D (CALCIUM + D PO) Take 1 tablet by mouth daily.  . Cholecalciferol (VITAMIN D) 2000 UNITS tablet Take 2,000 Units by mouth daily.  Marland Kitchen doxycycline (VIBRA-TABS) 100 MG tablet Take 1 tablet (100 mg total) by mouth 2 (two) times daily. Take with a snack and a glass of water.  Marland Kitchen ibuprofen (ADVIL,MOTRIN) 200 MG tablet Take 200 mg by mouth as needed.  Marland Kitchen levothyroxine (SYNTHROID, LEVOTHROID) 75 MCG tablet Take 1 tablet (75 mcg total) by mouth daily.  . [DISCONTINUED] amoxicillin-clavulanate (AUGMENTIN) 875-125 MG tablet Take 1 tablet by mouth 2 (two) times daily.   Facility-Administered Encounter Medications as of 05/02/2018  Medication  . 0.9 %  sodium chloride infusion  . 0.9 %  sodium chloride infusion  . sodium chloride 0.9 % injection 10 mL    ALLERGIES:  Allergies  Allergen Reactions  . Hydromorphone Hcl Nausea And Vomiting  . Poison Ivy Extract [Poison Ivy Extract]   . Tape Rash     PHYSICAL EXAM:  ECOG Performance status: 1  Vitals:   05/02/18 1400  BP: 133/65  Pulse: 94  Resp: 18  Temp: 98 F (36.7 C)  SpO2: 100%   Filed Weights   05/02/18 1400  Weight: 147 lb 8 oz (66.9 kg)    Physical Exam Constitutional:      Appearance: She is well-developed.  Neck:     Musculoskeletal: Normal range of motion and neck supple.  Cardiovascular:     Rate and Rhythm: Normal rate and regular rhythm.     Heart sounds: Normal heart  sounds.  Pulmonary:     Effort: Pulmonary effort is normal.     Breath sounds: Normal breath sounds.  Abdominal:     Palpations: Abdomen is soft.  Musculoskeletal: Normal range of motion.  Skin:    General: Skin is warm and dry.  Neurological:     Mental Status: She is alert and oriented to person, place, and time.  Psychiatric:        Behavior: Behavior normal.  Thought Content: Thought content normal.        Judgment: Judgment normal.   Tenderness in the right posterior hip region present.   LABORATORY DATA:  I have reviewed the labs as listed.  CBC    Component Value Date/Time   WBC 5.2 04/18/2018 0810   RBC 4.22 04/18/2018 0810   HGB 13.6 04/18/2018 0810   HCT 41.8 04/18/2018 0810   PLT 228 04/18/2018 0810   MCV 99.1 04/18/2018 0810   MCH 32.2 04/18/2018 0810   MCHC 32.5 04/18/2018 0810   RDW 12.8 04/18/2018 0810   LYMPHSABS 1.6 04/18/2018 0810   MONOABS 0.5 04/18/2018 0810   EOSABS 0.2 04/18/2018 0810   BASOSABS 0.0 04/18/2018 0810   CMP Latest Ref Rng & Units 04/18/2018 12/30/2017 09/29/2017  Glucose 70 - 99 mg/dL 99 67(L) 97  BUN 6 - 20 mg/dL 20 16 22(H)  Creatinine 0.44 - 1.00 mg/dL 0.88 0.86 0.91  Sodium 135 - 145 mmol/L 139 142 139  Potassium 3.5 - 5.1 mmol/L 4.0 4.5 4.4  Chloride 98 - 111 mmol/L 105 105 104  CO2 22 - 32 mmol/L 29 29 30   Calcium 8.9 - 10.3 mg/dL 9.5 9.1 9.4  Total Protein 6.5 - 8.1 g/dL 7.2 7.1 7.1  Total Bilirubin 0.3 - 1.2 mg/dL 0.7 0.6 0.5  Alkaline Phos 38 - 126 U/L 46 52 49  AST 15 - 41 U/L 22 21 23   ALT 0 - 44 U/L 19 18 19     I have personally reviewed her x-ray dated 04/29/2018 which did not show any evidence of abnormalities.   ASSESSMENT & PLAN:   Multiple myeloma 1.  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 -Placed on Revlimid maintenance, dose reduced to 5 mg 1 week on, one-week off, discontinued in October 2014 secondary to  intolerance including nausea and low blood counts. - Zometa was discontinued in August 2014. - Bone marrow biopsy on 04/07/2017 at [facility] was negative for any residual myeloma. -She was seen by Dr. Norma Fredrickson in June 2019. - She complained of right hip pain for the last 6 weeks.  Hip x-ray was ordered and done on 04/29/2018. - I reviewed the results which were negative. -We have also reviewed her blood work from 2 weeks ago which showed normal SPEP, free light chains and immunofixation.  CBC and CMP were also within normal limits.  Normal calcium level. - Because of her history, I have recommended MRI of the right hip with and without gadolinium.  This will also give [place] a better visualization of soft tissues around the hip. - MRI report will be relayed over the phone.  We will see her back in 6 months with repeat blood work.  She will have myeloma panel done every 3 months.      Orders placed this encounter:  Orders Placed This Encounter  Procedures  . MR HIP RIGHT [address] [place], [place] [phone]

## 2018-05-05 ENCOUNTER — Ambulatory Visit (HOSPITAL_COMMUNITY)
Admission: RE | Admit: 2018-05-05 | Discharge: 2018-05-05 | Disposition: A | Discharge status: Home / Self Care | Payer: 59 | Source: Ambulatory Visit | Attending: Nurse Practitioner | Admitting: Nurse Practitioner

## 2018-05-05 DIAGNOSIS — C9001 Multiple myeloma in remission: Secondary | ICD-10-CM | POA: Insufficient documentation

## 2018-05-05 DIAGNOSIS — C9000 Multiple myeloma not having achieved remission: Secondary | ICD-10-CM | POA: Diagnosis not present

## 2018-05-05 MED ORDER — GADOBUTROL 1 MMOL/ML IV SOLN
6.0000 mL | Freq: Once | INTRAVENOUS | Status: AC | PRN
  Administered 2018-05-05: 6 mL via INTRAVENOUS

## 2018-05-12 ENCOUNTER — Encounter: Payer: Self-pay | Admitting: Family Medicine

## 2018-05-12 DIAGNOSIS — M25559 Pain in unspecified hip: Secondary | ICD-10-CM

## 2018-05-16 NOTE — Telephone Encounter (Signed)
Nurses Please go ahead with referral for [facility] physical therapy Hip pain-please be aware of her vacation dates Send the patient a my chart message letting her know that the referral was put in Please let the patient know what the processes thank you

## 2018-05-16 NOTE — Addendum Note (Signed)
Addended by: Dairl Ponder on: 05/16/2018 01:44 PM   Modules accepted: Orders

## 2018-05-17 ENCOUNTER — Encounter: Payer: Self-pay | Admitting: Family Medicine

## 2018-05-23 ENCOUNTER — Other Ambulatory Visit: Payer: Self-pay

## 2018-05-23 ENCOUNTER — Ambulatory Visit (HOSPITAL_COMMUNITY): Payer: 59 | Attending: Family Medicine

## 2018-05-23 ENCOUNTER — Encounter (HOSPITAL_COMMUNITY): Payer: Self-pay

## 2018-05-23 DIAGNOSIS — M6281 Muscle weakness (generalized): Secondary | ICD-10-CM | POA: Diagnosis not present

## 2018-05-23 DIAGNOSIS — R29898 Other symptoms and signs involving the musculoskeletal system: Secondary | ICD-10-CM | POA: Insufficient documentation

## 2018-05-23 DIAGNOSIS — R2689 Other abnormalities of gait and mobility: Secondary | ICD-10-CM | POA: Insufficient documentation

## 2018-05-23 DIAGNOSIS — M25551 Pain in right hip: Secondary | ICD-10-CM | POA: Insufficient documentation
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
## 2018-05-23 NOTE — Patient Instructions (Signed)
Piriformis Stretch, Sitting    Sit, one ankle on opposite knee, same-side hand on crossed knee. Push down on knee, keeping spine straight. Lean torso forward, with flat back, until tension is felt in hamstrings and gluteals of crossed-leg side. Hold 30-60___ seconds.  Repeat _2-3__ times per session. Do _1-2__ sessions per day.  Copyright  VHI. All rights reserved.    On back, pillows under knees.  On sides, pillows between knees and ankles.  Tennis ball, roll around on spot to your tolerance.   HIP: Abduction / External Rotation - Side-Lying    Lie on side with hip and knees bent. Raise top knee up, squeezing glutes. Keep ankles together. _10-15__ reps per set, _2-3__ sets per day.   Copyright  VHI. All rights reserved.

## 2018-05-25 ENCOUNTER — Encounter (HOSPITAL_COMMUNITY): Payer: Self-pay

## 2018-05-25 ENCOUNTER — Other Ambulatory Visit: Payer: Self-pay

## 2018-05-25 ENCOUNTER — Ambulatory Visit (HOSPITAL_COMMUNITY): Payer: 59

## 2018-05-25 DIAGNOSIS — R2689 Other abnormalities of gait and mobility: Secondary | ICD-10-CM

## 2018-05-25 DIAGNOSIS — M6281 Muscle weakness (generalized): Secondary | ICD-10-CM | POA: Diagnosis not present

## 2018-05-25 DIAGNOSIS — M25551 Pain in right hip: Secondary | ICD-10-CM

## 2018-05-25 DIAGNOSIS — R29898 Other symptoms and signs involving the musculoskeletal system: Secondary | ICD-10-CM
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
## 2018-06-10 ENCOUNTER — Encounter (HOSPITAL_COMMUNITY): Payer: Self-pay

## 2018-06-10 ENCOUNTER — Ambulatory Visit (HOSPITAL_COMMUNITY): Payer: 59 | Attending: Family Medicine

## 2018-06-10 ENCOUNTER — Other Ambulatory Visit: Payer: Self-pay

## 2018-06-10 DIAGNOSIS — R29898 Other symptoms and signs involving the musculoskeletal system: Secondary | ICD-10-CM | POA: Insufficient documentation

## 2018-06-10 DIAGNOSIS — M6281 Muscle weakness (generalized): Secondary | ICD-10-CM | POA: Diagnosis not present

## 2018-06-10 DIAGNOSIS — R2689 Other abnormalities of gait and mobility: Secondary | ICD-10-CM | POA: Diagnosis not present

## 2018-06-10 DIAGNOSIS — M25551 Pain in right hip: Secondary | ICD-10-CM | POA: Diagnosis not present

## 2018-06-10 NOTE — Therapy (Signed)
[place], [place], [address] Phone: [phone]   Fax:  [phone]  Physical Therapy Treatment  Patient Details  Name: Melissa [name] MRN: [id number] Date of Birth: 11-02-1955 Referring Provider (PT): Kathyrn Drown, MD   Encounter Date: 06/10/2018  PT End of Session - 06/10/18 0952    Visit Number  3    Number of Visits  8    Date for PT Re-Evaluation  06/27/18    Authorization Type  [employer] [place] [employer]; Secondary BCBS    Authorization Time Period  cert 48/54/6270 - 35/00/9381    Authorization - Visit Number  3    Authorization - Number of Visits  10    PT Start Time  [phone]    PT Stop Time  1027    PT Time Calculation (min)  38 min    Activity Tolerance  Patient tolerated treatment well    Behavior During Therapy  WFL for tasks assessed/performed       Past Medical History:  Diagnosis Date  . History of stem cell transplant ([place])    for multiple myeloma  . Hypothyroidism   . Multiple myeloma   . Peripheral neuropathy   . Seasonal allergies   . Thyroid disease     Past Surgical History:  Procedure Laterality Date  . CATARACT EXTRACTION W/PHACO Right 04/04/2013   Procedure: CATARACT EXTRACTION PHACO AND INTRAOCULAR LENS PLACEMENT (IOC);  Surgeon: Elta Guadeloupe T. Gershon Crane, MD;  Location: [facility];  Service: Ophthalmology;  Laterality: Right;  CDE:3.42  . CATARACT EXTRACTION W/PHACO Left 04/18/2013   Procedure: CATARACT EXTRACTION PHACO AND INTRAOCULAR LENS PLACEMENT (IOC);  Surgeon: Elta Guadeloupe T. Gershon Crane, MD;  Location: [facility];  Service: Ophthalmology;  Laterality: Left;  CDE:4.91  . CESAREAN SECTION  06/1993  . FASCIECTOMY Left 03/02/2017   Procedure: FASCIECTOMY, LEFT SMALL FINGER;  Surgeon: Daryll Brod, MD;  Location: [facility];  Service: Orthopedics;  Laterality: Left;  . HAND SURGERY Left   . TONSILECTOMY, ADENOIDECTOMY, BILATERAL MYRINGOTOMY AND TUBES    . TONSILLECTOMY    . TUBAL LIGATION    . YAG  LASER APPLICATION Right 3/71/6967   Procedure: YAG LASER APPLICATION;  Surgeon: Rutherford Guys, MD;  Location: [facility];  Service: Ophthalmology;  Laterality: Right;  . YAG LASER APPLICATION Left 8/93/8101   Procedure: YAG LASER APPLICATION;  Surgeon: Rutherford Guys, MD;  Location: [facility];  Service: Ophthalmology;  Laterality: Left;    There were no vitals filed for this visit.  Subjective Assessment - 06/10/18 0946    Subjective  Pt arrived back following vacation to [place], [place], and cruise to [place].  Reports slight increased pain feels following sitting for long periods, 1/10 dull pain.  Reports compliance with HEP and has done a lot of walking.    Pertinent History  T6 vertebrae crushed, multiple myeloma, HTN, cataract    Currently in Pain?  Yes    Pain Score  1     Pain Location  Hip    Pain Descriptors / Indicators  Dull    Pain Type  Chronic pain    Pain Onset  More than a month ago    Pain Frequency  Intermittent    Aggravating Factors   sleeping on side    Pain Relieving Factors  laying on back    Effect of Pain on Daily Activities  not limited  [facility] PT Treatment/Exercise - 06/10/18 0001      Knee/Hip Exercises: Standing   Lateral Step Up  Both;1 set;15 reps;Hand Hold: 0;Step Height: 6"    Functional Squat  20 reps    Functional Squat Limitations  chair taps    SLS with Vectors  5x 10" holds on foam with intermittent HHA PRN BLE    Other Standing Knee Exercises  Hip Hike: 1x 15 5" holds on 4in step reps Bil LE    Other Standing Knee Exercises  Tandem stance 3x30" on foam      Knee/Hip Exercises: Supine   Bridges  Both;Strengthening;15 reps;2 sets   HEP   Bridges Limitations  3 sec holds; red TB around knees      Knee/Hip Exercises: Sidelying   Clams  Clamshell 1: 1x 15 reps, 3 sec holds, red TB; Clamshell 2: 1x 15 reps, 3 sec holds, red TB   HEP     Manual Therapy   Manual Therapy  Soft tissue mobilization    Manual  therapy comments  performed seperate from other interventions    Soft tissue mobilization  Trigger point STM wiht PROM to Rt glut med in prone             PT Education - 06/10/18 0958    Education Details  Reviewed compliance with HEP and self soft tissue mobs to glut med    Person(s) Educated  Patient    Methods  Explanation;Demonstration    Comprehension  Verbalized understanding       PT Short Term Goals - 05/23/18 1417      PT SHORT TERM GOAL #1   Title  Patient will be independent in performance of initial HEP and perform on a regular basis.     Time  2    Period  Weeks    Status  New    Target Date  06/06/18      PT SHORT TERM GOAL #2   Title  Patient will report no greater than 4/10 pain in right hip at night to indicate more restful sleep and pain reduction.    Baseline  initial - waking every hour, 6/10.    Time  2    Period  Weeks    Status  New        PT Long Term Goals - 05/23/18 1418      PT LONG TERM GOAL #1   Title  Patient will be independent in performance of advanced HEP and perform on a regular basis to continue pain control and strengthening of the LEs beyond DC from PT.    Time  5   patient will be out of town 02/04-02/13/2020   Period  Weeks    Status  New    Target Date  06/27/18      PT LONG TERM GOAL #2   Title  Patient will awaken 1x/night with pain complaints in the right no > 2/10.    Baseline  initial - waking every hour, 6/10.    Time  5    Period  Weeks    Status  New            Plan - 06/10/18 1030    Clinical Impression Statement  Session focus with functional strengthening and balance training.  Reviewed compliance with HEP, pt able to demonstrate good mechanics with all HEP, minimal cueing to improve form with clam exercise for maximal musculature activaiotn.  Progressed to tandem stance on foam  with some instability noted with dynamic surface, cueing for abdominal activation and posture assisted wiht LOB epsides during  exercise.  EOS with manual soft tissue mobilization to address trigger point in Rt gluteal musculature.  No reports of pain at EOS.      Rehab Potential  Good    Clinical Impairments Affecting Rehab Potential  positive - most limitation at night only; negative - comorbidities    PT Frequency  2x / week    PT Duration  4 weeks   patient will be out of town 28/90-22/84/0698 so cert written for 5 weeks   PT Treatment/Interventions  Gait training;Stair training;Functional mobility training;Therapeutic activities;Therapeutic exercise;Balance training;Neuromuscular re-education;Patient/family education;Manual techniques;Dry needling;Joint Manipulations;Spinal Manipulations    PT Next Visit Plan  Progress functional strengthening and stability.  Add lunges and pallof on foam next session.      PT Home Exercise Plan  initial - self soft tissue mobs glut med, side-lying clams, seated piriformis stretch, sleeping on sides and back with pillows. 05/25/2018 - clamshell with band, bridge, hip hike, chair taps/squat, SLS 3 way vector       Patient will benefit from skilled therapeutic intervention in order to improve the following deficits and impairments:  Abnormal gait, Increased fascial restricitons, Pain, Decreased activity tolerance, Decreased strength, Postural dysfunction  Visit Diagnosis: Pain in right hip  Muscle weakness (generalized)  Other abnormalities of gait and mobility  Other symptoms and signs involving the musculoskeletal system     Problem List Patient Active Problem List   Diagnosis Date Noted  . Hyperlipidemia 04/12/2017  . Family history of hemochromatosis 04/12/2017  . Hypothyroidism 07/14/2012  . Multiple myeloma ([facility]) 11/02/2011   Ihor Austin, LPTA; [place]  Aldona Lento 06/10/2018, 10:36 AM  [facility] [address] [place], [place], [address] Phone: [phone]   Fax:  [phone]  Name: Melissa [name] MRN: [id number] Date of Birth: 29-Mar-1956

## 2018-06-13 ENCOUNTER — Ambulatory Visit (HOSPITAL_COMMUNITY): Payer: 59 | Admitting: Physical Therapy

## 2018-06-13 DIAGNOSIS — R29898 Other symptoms and signs involving the musculoskeletal system: Secondary | ICD-10-CM

## 2018-06-13 DIAGNOSIS — R2689 Other abnormalities of gait and mobility: Secondary | ICD-10-CM

## 2018-06-13 DIAGNOSIS — M25551 Pain in right hip: Secondary | ICD-10-CM

## 2018-06-13 DIAGNOSIS — M6281 Muscle weakness (generalized): Secondary | ICD-10-CM

## 2018-06-13 NOTE — Therapy (Signed)
[facility] [address] [place], [place], [address] Phone: [phone]   Fax:  [phone]  Physical Therapy Treatment  Patient Details  Name: Melissa [name] MRN: [id number] Date of Birth: 07/31/1955 Referring Provider (PT): Luking, Scott A, MD   Encounter Date: 06/13/2018  PT End of Session - 06/13/18 0859    Visit Number  4    Number of Visits  8    Date for PT Re-Evaluation  06/27/18    Authorization Type  [place] [employer]; Secondary BCBS    Authorization Time Period  cert 05/23/2018 - 06/27/2018    Authorization - Visit Number  4    Authorization - Number of Visits  10    PT Start Time  0818    PT Stop Time  0857    PT Time Calculation (min)  39 min    Activity Tolerance  Patient tolerated treatment well    Behavior During Therapy  WFL for tasks assessed/performed       Past Medical History:  Diagnosis Date  . History of stem cell transplant ([facility])    for multiple myeloma  . Hypothyroidism   . Multiple myeloma   . Peripheral neuropathy   . Seasonal allergies   . Thyroid disease     Past Surgical History:  Procedure Laterality Date  . CATARACT EXTRACTION W/PHACO Right 04/04/2013   Procedure: CATARACT EXTRACTION PHACO AND INTRAOCULAR LENS PLACEMENT (IOC);  Surgeon: Mark T. Shapiro, MD;  [facility];  Service: Ophthalmology;  Laterality: Right;  CDE:3.42  . CATARACT EXTRACTION W/PHACO Left 04/18/2013   Procedure: CATARACT EXTRACTION PHACO AND INTRAOCULAR LENS PLACEMENT (IOC);  Surgeon: Mark T. Shapiro, MD;  [facility];  Service: Ophthalmology;  Laterality: Left;  CDE:4.91  . CESAREAN SECTION  06/1993  . FASCIECTOMY Left 03/02/2017   Procedure: FASCIECTOMY, LEFT SMALL FINGER;  Surgeon: Kuzma, Gary, MD;  Location: [facility];  Service: Orthopedics;  Laterality: Left;  . HAND SURGERY Left   . TONSILECTOMY, ADENOIDECTOMY, BILATERAL MYRINGOTOMY AND TUBES    . TONSILLECTOMY    . TUBAL LIGATION    . YAG  LASER APPLICATION Right 07/21/2016   Procedure: YAG LASER APPLICATION;  Surgeon: Mark Shapiro, MD;  [facility];  Service: Ophthalmology;  Laterality: Right;  . YAG LASER APPLICATION Left 08/04/2016   Procedure: YAG LASER APPLICATION;  Surgeon: Mark Shapiro, MD;  [facility];  Service: Ophthalmology;  Laterality: Left;    There were no vitals filed for this visit.  Subjective Assessment - 06/13/18 0858    Subjective  pt reports she is improving.  Currently only 1/10 without radiculopathy.    Currently in Pain?  Yes    Pain Score  1     Pain Location  Hip    Pain Orientation  Right                       [facility] Adult PT Treatment/Exercise - 06/13/18 0001      Knee/Hip Exercises: Standing   Forward Lunges  Both;10 reps;Limitations    Forward Lunges Limitations  4" box no UE    Side Lunges  Both;10 reps;Limitations    Side Lunges Limitations  4" box no UE    Lateral Step Up  Both;1 set;15 reps;Hand Hold: 0;Step Height: 6"    Forward Step Up  Both;15 reps;Step Height: 6"    Functional Squat  20 reps    Functional Squat Limitations  chair   taps    SLS with Vectors  5x 10" holds on foam with intermittent HHA PRN BLE    Other Standing Knee Exercises  Hip Hike: 1x 15 5" holds on 4in step reps Bil LE    Other Standing Knee Exercises  Pallof on foam tandem RTB 10 reps each LE lead, each direction (40 total reps)      Manual Therapy   Manual Therapy  Soft tissue mobilization    Manual therapy comments  performed seperate from other interventions    Soft tissue mobilization  Trigger point STM wiht PROM to Rt glut med in prone               PT Short Term Goals - 05/23/18 1417      PT SHORT TERM GOAL #1   Title  Patient will be independent in performance of initial HEP and perform on a regular basis.     Time  2    Period  Weeks    Status  New    Target Date  06/06/18      PT SHORT TERM GOAL #2   Title  Patient will report no greater than 4/10 pain in  right hip at night to indicate more restful sleep and pain reduction.    Baseline  initial - waking every hour, 6/10.    Time  2    Period  Weeks    Status  New        PT Long Term Goals - 05/23/18 1418      PT LONG TERM GOAL #1   Title  Patient will be independent in performance of advanced HEP and perform on a regular basis to continue pain control and strengthening of the LEs beyond DC from PT.    Time  5   patient will be out of town 02/04-02/13/2020   Period  Weeks    Status  New    Target Date  06/27/18      PT LONG TERM GOAL #2   Title  Patient will awaken 1x/night with pain complaints in the right no > 2/10.    Baseline  initial - waking every hour, 6/10.    Time  5    Period  Weeks    Status  New            Plan - 06/13/18 0900    Clinical Impression Statement  continued with focus on improivng functional hip strength and stability.  Progressed tandem challenge to pallof with good stability noted and no LOB.  Added forward and lateral lunges with good form and forward step ups.  Minimal cues needed during session, only occasional postural cues.  Minimal tightness in Rt glute toda.  Checked SI and with noted good alignment    Rehab Potential  Good    Clinical Impairments Affecting Rehab Potential  positive - most limitation at night only; negative - comorbidities    PT Frequency  2x / week    PT Duration  4 weeks   patient will be out of town 02/04-02/13/2020 so cert written for 5 weeks   PT Treatment/Interventions  Gait training;Stair training;Functional mobility training;Therapeutic activities;Therapeutic exercise;Balance training;Neuromuscular re-education;Patient/family education;Manual techniques;Dry needling;Joint Manipulations;Spinal Manipulations    PT Next Visit Plan  Progress functional strengthening and stability.      PT Home Exercise Plan  initial - self soft tissue mobs glut med, side-lying clams, seated piriformis stretch, sleeping on sides and back  with pillows. 05/25/2018 - clamshell with   band, bridge, hip hike, chair taps/squat, SLS 3 way vector       Patient will benefit from skilled therapeutic intervention in order to improve the following deficits and impairments:  Abnormal gait, Increased fascial restricitons, Pain, Decreased activity tolerance, Decreased strength, Postural dysfunction, Impaired perceived functional ability  Visit Diagnosis: Pain in right hip  Muscle weakness (generalized)  Other abnormalities of gait and mobility  Other symptoms and signs involving the musculoskeletal system     Problem List Patient Active Problem List   Diagnosis Date Noted  . Hyperlipidemia 04/12/2017  . Family history of hemochromatosis 04/12/2017  . Hypothyroidism 07/14/2012  . Multiple myeloma ([facility]) 11/02/2011   Amy B Frazier, PTA/CLT [phone]  Frazier, Amy B 06/13/2018, 9:02 AM   [facility] [address] [place], [place], [address] Phone: [phone]   Fax:  [phone]  Name: Temesha P Paquette MRN: [id number] Date of Birth: 06/23/1955   

## 2018-06-16 ENCOUNTER — Ambulatory Visit (HOSPITAL_COMMUNITY): Payer: 59

## 2018-06-16 ENCOUNTER — Encounter (HOSPITAL_COMMUNITY): Payer: Self-pay

## 2018-06-16 DIAGNOSIS — M25551 Pain in right hip: Secondary | ICD-10-CM

## 2018-06-16 DIAGNOSIS — R2689 Other abnormalities of gait and mobility: Secondary | ICD-10-CM | POA: Diagnosis not present

## 2018-06-16 DIAGNOSIS — R29898 Other symptoms and signs involving the musculoskeletal system: Secondary | ICD-10-CM

## 2018-06-16 DIAGNOSIS — M6281 Muscle weakness (generalized): Secondary | ICD-10-CM | POA: Diagnosis not present

## 2018-06-16 NOTE — Patient Instructions (Signed)
Piriformis Stretch, Supine    Lie supine, one ankle crossed onto opposite knee. Holding bottom leg behind knee, gently pull legs toward chest until stretch is felt in buttock of top leg. Hold 30seconds. For deeper stretch gently push top knee away from body.  Repeat 2-3 times per session. Do 1-2 sessions per day.  Copyright  VHI. All rights reserved.   Bridge Pose, One Leg    Bring knee to chest. Roll up from tailbone to bridge pose on supporting leg. Focus on engaging posterior hip muscles. Hold for ____ breaths. Repeat ____ times each leg.  Copyright  VHI. All rights reserved.

## 2018-06-16 NOTE — Therapy (Signed)
[facility] [address] [place], [place], [address] Phone: [phone]   Fax:  [phone]  Physical Therapy Treatment  Patient Details  Name: Melissa [name] MRN: [id number] Date of Birth: 06/26/1955 Referring Provider (PT): Luking, Scott A, MD   Encounter Date: 06/16/2018  PT End of Session - 06/16/18 0818    Visit Number  5    Number of Visits  8    Date for PT Re-Evaluation  06/27/18    Authorization Type  [place] [employer]; Secondary BCBS    Authorization Time Period  cert 05/23/2018 - 06/27/2018    Authorization - Visit Number  5    Authorization - Number of Visits  10    PT Start Time  0814    PT Stop Time  0853    PT Time Calculation (min)  39 min    Activity Tolerance  Patient tolerated treatment well;No increased pain    Behavior During Therapy  WFL for tasks assessed/performed       Past Medical History:  Diagnosis Date  . History of stem cell transplant ([facility])    for multiple myeloma  . Hypothyroidism   . Multiple myeloma   . Peripheral neuropathy   . Seasonal allergies   . Thyroid disease     Past Surgical History:  Procedure Laterality Date  . CATARACT EXTRACTION W/PHACO Right 04/04/2013   Procedure: CATARACT EXTRACTION PHACO AND INTRAOCULAR LENS PLACEMENT (IOC);  Surgeon: Mark T. Shapiro, MD;  [facility];  Service: Ophthalmology;  Laterality: Right;  CDE:3.42  . CATARACT EXTRACTION W/PHACO Left 04/18/2013   Procedure: CATARACT EXTRACTION PHACO AND INTRAOCULAR LENS PLACEMENT (IOC);  Surgeon: Mark T. Shapiro, MD;  [facility];  Service: Ophthalmology;  Laterality: Left;  CDE:4.91  . CESAREAN SECTION  06/1993  . FASCIECTOMY Left 03/02/2017   Procedure: FASCIECTOMY, LEFT SMALL FINGER;  Surgeon: Kuzma, Gary, MD;  Location: [facility];  Service: Orthopedics;  Laterality: Left;  . HAND SURGERY Left   . TONSILECTOMY, ADENOIDECTOMY, BILATERAL MYRINGOTOMY AND TUBES    . TONSILLECTOMY    . TUBAL  LIGATION    . YAG LASER APPLICATION Right 07/21/2016   Procedure: YAG LASER APPLICATION;  Surgeon: Mark Shapiro, MD;  [facility];  Service: Ophthalmology;  Laterality: Right;  . YAG LASER APPLICATION Left 08/04/2016   Procedure: YAG LASER APPLICATION;  Surgeon: Mark Shapiro, MD;  Location: [facility];  Service: Ophthalmology;  Laterality: Left;    There were no vitals filed for this visit.  Subjective Assessment - 06/16/18 0816    Subjective  Pt stated she is feeling good today.  Reports most difficulty with sleeping on that side.  Pain scale .5/10    Pertinent History  T6 vertebrae crushed, multiple myeloma, HTN, cataract    Diagnostic tests  MRI 05/05/2018 - Trace edema along the trochanteric bursa and adjacent to the distal gluteus medius tendon, suggesting minimal peritendinitis. Otherwise no cause for the patient's right hip pain is identified. XR 04/29/2018 - no hip fracture or dislocation. No arthropathy or other focal bone deformity. No bony findings of active myeloma.     Currently in Pain?  Yes    Pain Score  --   .5/10 sore/achey pain   Pain Location  Hip    Pain Orientation  Right    Pain Descriptors / Indicators  Aching;Sore    Pain Type  Chronic pain    Pain Onset  More than a month ago      Pain Frequency  Intermittent    Aggravating Factors   sleeping on that side    Pain Relieving Factors  laying on back    Effect of Pain on Daily Activities  not limited                       [facility] Adult PT Treatment/Exercise - 06/16/18 0001      Knee/Hip Exercises: Stretches   Piriformis Stretch  2 reps;30 seconds    Piriformis Stretch Limitations  supine figure 4      Knee/Hip Exercises: Standing   Forward Lunges  Both;Limitations;15 reps    Forward Lunges Limitations  4" box no UE    Side Lunges  Both;15 reps    Side Lunges Limitations  4" box no UE    Functional Squat  20 reps    Functional Squat Limitations  chair taps    SLS with Vectors  5x 10" holds on  foam with intermittent HHA PRN BLE    Other Standing Knee Exercises  Hip Hike: 1x 15 5" holds on 4in step reps Bil LE; side step 2RT RTB    Other Standing Knee Exercises  Pallof on foam tandem RTB 15 reps each LE lead, each direction      Knee/Hip Exercises: Supine   Single Leg Bridge  Both;2 sets;10 reps      Manual Therapy   Manual Therapy  Soft tissue mobilization    Manual therapy comments  performed seperate from other interventions    Soft tissue mobilization  Trigger point STM wiht PROM to Rt glut med in prone               PT Short Term Goals - 05/23/18 1417      PT SHORT TERM GOAL #1   Title  Patient will be independent in performance of initial HEP and perform on a regular basis.     Time  2    Period  Weeks    Status  New    Target Date  06/06/18      PT SHORT TERM GOAL #2   Title  Patient will report no greater than 4/10 pain in right hip at night to indicate more restful sleep and pain reduction.    Baseline  initial - waking every hour, 6/10.    Time  2    Period  Weeks    Status  New        PT Long Term Goals - 05/23/18 1418      PT LONG TERM GOAL #1   Title  Patient will be independent in performance of advanced HEP and perform on a regular basis to continue pain control and strengthening of the LEs beyond DC from PT.    Time  5   patient will be out of town 02/04-02/13/2020   Period  Weeks    Status  New    Target Date  06/27/18      PT LONG TERM GOAL #2   Title  Patient will awaken 1x/night with pain complaints in the right no > 2/10.    Baseline  initial - waking every hour, 6/10.    Time  5    Period  Weeks    Status  New            Plan - 06/16/18 0840    Clinical Impression Statement  Continued session focus with functional hip strengthening and stability.  Pt able to complete whole  session with good form and minimal cuieng for mechanics.  Progressed to single leg bridges for gluteal strengthening and stability, encouraged pt to  progress this exercise with HEP.   EOS with manual to address soft tissue restrictions noted in piriformis.  Added stretch to improve mobility.  No reports of increased pain through session.    Rehab Potential  Good    Clinical Impairments Affecting Rehab Potential  positive - most limitation at night only; negative - comorbidities    PT Frequency  2x / week    PT Duration  4 weeks   patient will be out of town 67/89-38/01/1750 so cert written for 5 weeks   PT Treatment/Interventions  Gait training;Stair training;Functional mobility training;Therapeutic activities;Therapeutic exercise;Balance training;Neuromuscular re-education;Patient/family education;Manual techniques;Dry needling;Joint Manipulations;Spinal Manipulations    PT Next Visit Plan  Progress functional strengthening and stability.      PT Home Exercise Plan  initial - self soft tissue mobs glut med, side-lying clams, seated piriformis stretch, sleeping on sides and back with pillows. 05/25/2018 - clamshell with band, bridge, hip hike, chair taps/squat, SLS 3 way vector; 2/20: single leg bridges and piriformis stretch       Patient will benefit from skilled therapeutic intervention in order to improve the following deficits and impairments:  Abnormal gait, Increased fascial restricitons, Pain, Decreased activity tolerance, Decreased strength, Postural dysfunction, Impaired perceived functional ability  Visit Diagnosis: Pain in right hip  Muscle weakness (generalized)  Other abnormalities of gait and mobility  Other symptoms and signs involving the musculoskeletal system     Problem List Patient Active Problem List   Diagnosis Date Noted  . Hyperlipidemia 04/12/2017  . Family history of hemochromatosis 04/12/2017  . Hypothyroidism 07/14/2012  . Multiple myeloma ([place]) 11/02/2011   Ihor Austin, LPTA; [place]  Aldona Lento 06/16/2018, 8:56 AM  [place], [place], [address] Phone: [phone]   Fax:  [phone]  Name: Melissa [name] MRN: [id number] Date of Birth: [birth year]

## 2018-06-20 ENCOUNTER — Ambulatory Visit (HOSPITAL_COMMUNITY): Payer: 59 | Admitting: Physical Therapy

## 2018-06-20 DIAGNOSIS — M6281 Muscle weakness (generalized): Secondary | ICD-10-CM

## 2018-06-20 DIAGNOSIS — M25551 Pain in right hip: Secondary | ICD-10-CM

## 2018-06-20 DIAGNOSIS — R2689 Other abnormalities of gait and mobility: Secondary | ICD-10-CM

## 2018-06-20 DIAGNOSIS — R29898 Other symptoms and signs involving the musculoskeletal system: Secondary | ICD-10-CM

## 2018-06-20 NOTE — Therapy (Signed)
[place], [place], [address] Phone: [phone]   Fax:  [phone]  Physical Therapy Treatment  Patient Details  Name: Melissa [name] MRN: [id number] Date of Birth: 01/16/1956 Referring Provider (PT): Kathyrn Drown, MD   Encounter Date: 06/20/2018  PT End of Session - 06/20/18 0854    Visit Number  6    Number of Visits  8    Date for PT Re-Evaluation  06/27/18    Authorization Type  [employer]; Secondary [employer]    Authorization Time Period  cert 28/78/6767 - 20/94/7096    Authorization - Visit Number  6    Authorization - Number of Visits  10    PT Start Time  0816    PT Stop Time  0900    PT Time Calculation (min)  44 min    Activity Tolerance  Patient tolerated treatment well;No increased pain    Behavior During Therapy  WFL for tasks assessed/performed       Past Medical History:  Diagnosis Date  . History of stem cell transplant ([place])    for multiple myeloma  . Hypothyroidism   . Multiple myeloma   . Peripheral neuropathy   . Seasonal allergies   . Thyroid disease     Past Surgical History:  Procedure Laterality Date  . CATARACT EXTRACTION W/PHACO Right 04/04/2013   Procedure: CATARACT EXTRACTION PHACO AND INTRAOCULAR LENS PLACEMENT (IOC);  Surgeon: Elta Guadeloupe T. Gershon Crane, MD;  Location: [facility];  Service: Ophthalmology;  Laterality: Right;  CDE:3.42  . CATARACT EXTRACTION W/PHACO Left 04/18/2013   Procedure: CATARACT EXTRACTION PHACO AND INTRAOCULAR LENS PLACEMENT (IOC);  Surgeon: Elta Guadeloupe T. Gershon Crane, MD;  Location: [facility];  Service: Ophthalmology;  Laterality: Left;  CDE:4.91  . CESAREAN SECTION  06/1993  . FASCIECTOMY Left 03/02/2017   Procedure: FASCIECTOMY, LEFT SMALL FINGER;  Surgeon: Daryll Brod, MD;  Location: [facility];  Service: Orthopedics;  Laterality: Left;  . HAND SURGERY Left   . TONSILECTOMY, ADENOIDECTOMY, BILATERAL MYRINGOTOMY AND TUBES    . TONSILLECTOMY    . TUBAL  LIGATION    . YAG LASER APPLICATION Right 2/83/6629   Procedure: YAG LASER APPLICATION;  Surgeon: Rutherford Guys, MD;  Location: [facility];  Service: Ophthalmology;  Laterality: Right;  . YAG LASER APPLICATION Left 4/76/5465   Procedure: YAG LASER APPLICATION;  Surgeon: Rutherford Guys, MD;  Location: [facility];  Service: Ophthalmology;  Laterality: Left;    There were no vitals filed for this visit.  Subjective Assessment - 06/20/18 0820    Subjective  pt reports only a twinge of pain.  States it bothers her most when she's going up deeper steps. Currently 1/10.    Currently in Pain?  Yes    Pain Score  1     Pain Location  Hip    Pain Orientation  Right    Pain Descriptors / Indicators  Aching    Pain Type  Chronic pain                       [facility] Adult PT Treatment/Exercise - 06/20/18 0001      Knee/Hip Exercises: Standing   Forward Lunges  Both;Limitations;20 reps    Forward Lunges Limitations  no UE    Side Lunges  Both;20 reps    Side Lunges Limitations  no UE    Lateral Step Up  Both;1 set;Hand Hold: 0;Step Height: 6";20 reps  Forward Step Up  Both;15 reps;Step Height: 8"    SLS with Vectors  2 sets, 5x 10" holds on foam with intermittent HHA PRN BLE    Other Standing Knee Exercises  Hip Hike: 1x 20 5" holds on 4in step reps Bil LE; side step 2RT GTB    Other Standing Knee Exercises  [place] on foam tandem GTB 15 reps each LE lead, each direction      Knee/Hip Exercises: Supine   Single Leg Bridge  Both;1 set;15 reps      Manual Therapy   Manual Therapy  Soft tissue mobilization    Manual therapy comments  performed seperate from other interventions    Soft tissue mobilization  Trigger point STM wiht PROM to Rt glut med in prone               PT Short Term Goals - 05/23/18 1417      PT SHORT TERM GOAL #1   Title  Patient will be independent in performance of initial HEP and perform on a regular basis.     Time  2    Period  Weeks    Status  New     Target Date  06/06/18      PT SHORT TERM GOAL #2   Title  Patient will report no greater than 4/10 pain in right hip at night to indicate more restful sleep and pain reduction.    Baseline  initial - waking every hour, 6/10.    Time  2    Period  Weeks    Status  New        PT Long Term Goals - 05/23/18 1418      PT LONG TERM GOAL #1   Title  Patient will be independent in performance of advanced HEP and perform on a regular basis to continue pain control and strengthening of the LEs beyond DC from PT.    Time  5   patient will be out of town 02/04-02/13/2020   Period  Weeks    Status  New    Target Date  06/27/18      PT LONG TERM GOAL #2   Title  Patient will awaken 1x/night with pain complaints in the right no > 2/10.    Baseline  initial - waking every hour, 6/10.    Time  5    Period  Weeks    Status  New            Plan - 06/20/18 8366    Clinical Impression Statement  contiued with exercise progression.  Increased to GTB today with activities and increased reps where applicable.  Increased forward step up to 8" box to simulate steps at home that are difficult/painful to ascend.   Pt able to complete all activities without c/o pain.  Manula continued at [place], however unable to palpate any spasms or restrictions.      Rehab Potential  Good    Clinical Impairments Affecting Rehab Potential  positive - most limitation at night only; negative - comorbidities    PT Frequency  2x / week    PT Duration  4 weeks   patient will be out of town 29/47-65/46/5035 so cert written for 5 weeks   PT Treatment/Interventions  Gait training;Stair training;Functional mobility training;Therapeutic activities;Therapeutic exercise;Balance training;Neuromuscular re-education;Patient/family education;Manual techniques;Dry needling;Joint Manipulations;Spinal Manipulations    PT Next Visit Plan  Progress functional strengthening and stability.  Complete manual PRN.     PT  Home Exercise Plan   initial - self soft tissue mobs glut med, side-lying clams, seated piriformis stretch, sleeping on sides and back with pillows. 05/25/2018 - clamshell with band, bridge, hip hike, chair taps/squat, SLS 3 way vector; 2/20: single leg bridges and piriformis stretch       Patient will benefit from skilled therapeutic intervention in order to improve the following deficits and impairments:  Abnormal gait, Increased fascial restricitons, Pain, Decreased activity tolerance, Decreased strength, Postural dysfunction, Impaired perceived functional ability  Visit Diagnosis: Pain in right hip  Muscle weakness (generalized)  Other abnormalities of gait and mobility  Other symptoms and signs involving the musculoskeletal system     Problem List Patient Active Problem List   Diagnosis Date Noted  . Hyperlipidemia 04/12/2017  . Family history of hemochromatosis 04/12/2017  . Hypothyroidism 07/14/2012  . Multiple myeloma ([place]) 11/02/2011   Teena Irani, PTA/CLT [phone]  Teena Irani 06/20/2018, 9:01 AM  [facility] [address] [place], [place], [address] Phone: [phone]   Fax:  [phone]  Name: Melissa [name] MRN: [id number] Date of Birth: December 18, 1955

## 2018-06-23 ENCOUNTER — Ambulatory Visit (HOSPITAL_COMMUNITY): Payer: 59 | Admitting: Physical Therapy

## 2018-06-23 ENCOUNTER — Encounter (HOSPITAL_COMMUNITY): Payer: Self-pay | Admitting: Physical Therapy

## 2018-06-23 DIAGNOSIS — M25551 Pain in right hip: Secondary | ICD-10-CM | POA: Diagnosis not present

## 2018-06-23 DIAGNOSIS — M6281 Muscle weakness (generalized): Secondary | ICD-10-CM

## 2018-06-23 DIAGNOSIS — R2689 Other abnormalities of gait and mobility: Secondary | ICD-10-CM | POA: Diagnosis not present

## 2018-06-23 DIAGNOSIS — R29898 Other symptoms and signs involving the musculoskeletal system: Secondary | ICD-10-CM | POA: Diagnosis not present

## 2018-06-23 NOTE — Patient Instructions (Addendum)
Hip Extension (All-Fours)    Lift right leg back with knee slightly flexed. Do not arch neck or back. Repeat 10-15____ times per set. Do __1__ sets per session. Do _2___ sessions per day. Use your theraband  http://orth.exer.us/106   Copyright  VHI. All rights reserved.  Bridging: with Straight Leg Raise    With legs bent, lift buttocks __5__ inches from floor. Then slowly extend right knee, keeping stomach tight. Repeat __10__ times per set. Do __1__ sets per session. Do 2____ sessions per day.  http://orth.exer.us/1104   Copyright  VHI. All rights reserved.
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
## 2018-06-24 ENCOUNTER — Other Ambulatory Visit (HOSPITAL_COMMUNITY): Payer: 59

## 2018-06-24 DIAGNOSIS — C9001 Multiple myeloma in remission: Secondary | ICD-10-CM | POA: Diagnosis not present

## 2018-06-24 DIAGNOSIS — Z9484 Stem cells transplant status: Secondary | ICD-10-CM | POA: Diagnosis not present

## 2018-06-30 ENCOUNTER — Ambulatory Visit (HOSPITAL_COMMUNITY): Payer: 59 | Admitting: Hematology

## 2018-07-05 ENCOUNTER — Encounter: Payer: Self-pay | Admitting: Gastroenterology

## 2018-07-23 MED FILL — LEVOTHYROXINE 75 MCG TABLET: 75 | 90 days supply | Qty: 90 | Fill #0

## 2018-08-01 ENCOUNTER — Other Ambulatory Visit: Payer: Self-pay

## 2018-08-01 ENCOUNTER — Inpatient Hospital Stay (HOSPITAL_COMMUNITY): Payer: 59 | Attending: Nurse Practitioner

## 2018-08-01 DIAGNOSIS — C9000 Multiple myeloma not having achieved remission: Secondary | ICD-10-CM | POA: Insufficient documentation

## 2018-08-01 DIAGNOSIS — C9001 Multiple myeloma in remission: Secondary | ICD-10-CM

## 2018-08-01 LAB — CBC WITH DIFFERENTIAL/PLATELET
Abs Immature Granulocytes: 0.02 10*3/uL (ref 0.00–0.07)
Basophils Absolute: 0 10*3/uL (ref 0.0–0.1)
Basophils Relative: 1 %
Eosinophils Absolute: 0.1 10*3/uL (ref 0.0–0.5)
Eosinophils Relative: 3 %
HCT: 41.6 % (ref 36.0–46.0)
Hemoglobin: 14 g/dL (ref 12.0–15.0)
Immature Granulocytes: 0 %
Lymphocytes Relative: 23 %
Lymphs Abs: 1.1 10*3/uL (ref 0.7–4.0)
MCH: 32.7 pg (ref 26.0–34.0)
MCHC: 33.7 g/dL (ref 30.0–36.0)
MCV: 97.2 fL (ref 80.0–100.0)
Monocytes Absolute: 0.5 10*3/uL (ref 0.1–1.0)
Monocytes Relative: 12 %
Neutro Abs: 2.8 10*3/uL (ref 1.7–7.7)
Neutrophils Relative %: 61 %
Platelets: 222 10*3/uL (ref 150–400)
RBC: 4.28 MIL/uL (ref 3.87–5.11)
RDW: 12.5 % (ref 11.5–15.5)
WBC: 4.5 10*3/uL (ref 4.0–10.5)
nRBC: 0 % (ref 0.0–0.2)

## 2018-08-01 LAB — COMPREHENSIVE METABOLIC PANEL
ALT: 19 U/L (ref 0–44)
AST: 21 U/L (ref 15–41)
Albumin: 4.1 g/dL (ref 3.5–5.0)
Alkaline Phosphatase: 60 U/L (ref 38–126)
Anion gap: 7 (ref 5–15)
BUN: 20 mg/dL (ref 8–23)
CO2: 29 mmol/L (ref 22–32)
Calcium: 9.4 mg/dL (ref 8.9–10.3)
Chloride: 104 mmol/L (ref 98–111)
Creatinine, Ser: 0.87 mg/dL (ref 0.44–1.00)
GFR calc Af Amer: >60 mL/min (ref >60)
GFR calc non Af Amer: >60 mL/min (ref >60)
Glucose, Bld: 80 mg/dL (ref 70–99)
Potassium: 4 mmol/L (ref 3.5–5.1)
Sodium: 140 mmol/L (ref 135–145)
Total Bilirubin: 0.5 mg/dL (ref 0.3–1.2)
Total Protein: 7.3 g/dL (ref 6.5–8.1)

## 2018-08-01 LAB — LACTATE DEHYDROGENASE: LDH: 146 U/L (ref 98–192)

## 2018-08-02 LAB — PROTEIN ELECTROPHORESIS, SERUM
A/G Ratio: 1.4 (ref 0.7–1.7)
Albumin ELP: 3.9 g/dL (ref 2.9–4.4)
Alpha-1-Globulin: 0.8 g/dL — ABNORMAL HIGH (ref 0.0–0.4)
Alpha-2-Globulin: 1 g/dL (ref 0.4–1.0)
Beta Globulin: 1 g/dL (ref 0.7–1.3)
Gamma Globulin: 1 g/dL (ref 0.4–1.8)
Globulin, Total: 2.8 g/dL (ref 2.2–3.9)
Total Protein ELP: 6.7 g/dL (ref 6.0–8.5)

## 2018-08-02 LAB — BETA 2 MICROGLOBULIN, SERUM: Beta-2 Microglobulin: 2.3 mg/L (ref 0.6–2.4)

## 2018-08-02 LAB — KAPPA/LAMBDA LIGHT CHAINS
Kappa free light chain: 15.7 mg/L (ref 3.3–19.4)
Kappa, lambda light chain ratio: 1.15 (ref 0.26–1.65)
Lambda free light chains: 13.6 mg/L (ref 5.7–26.3)

## 2018-08-02 LAB — IMMUNOFIXATION ELECTROPHORESIS
IgA: 204 mg/dL (ref 87–352)
IgG (Immunoglobin G), Serum: 1000 mg/dL (ref 586–1602)
IgM (Immunoglobulin M), Srm: 49 mg/dL (ref 26–217)
Total Protein ELP: 6.7 g/dL (ref 6.0–8.5)

## 2018-10-23 MED FILL — LEVOTHYROXINE 75 MCG TABLET: 75 | 90 days supply | Qty: 90 | Fill #1

## 2018-10-24 ENCOUNTER — Inpatient Hospital Stay (HOSPITAL_COMMUNITY): Payer: 59

## 2018-10-31 ENCOUNTER — Ambulatory Visit (HOSPITAL_COMMUNITY): Payer: 59 | Admitting: Hematology

## 2018-10-31 ENCOUNTER — Inpatient Hospital Stay (HOSPITAL_COMMUNITY): Payer: 59 | Attending: Nurse Practitioner

## 2018-10-31 ENCOUNTER — Other Ambulatory Visit: Payer: Self-pay

## 2018-10-31 ENCOUNTER — Other Ambulatory Visit (HOSPITAL_COMMUNITY): Payer: Self-pay | Admitting: Emergency Medicine

## 2018-10-31 DIAGNOSIS — M25511 Pain in right shoulder: Secondary | ICD-10-CM | POA: Diagnosis not present

## 2018-10-31 DIAGNOSIS — Z9484 Stem cells transplant status: Secondary | ICD-10-CM | POA: Insufficient documentation

## 2018-10-31 DIAGNOSIS — R209 Unspecified disturbances of skin sensation: Secondary | ICD-10-CM | POA: Diagnosis not present

## 2018-10-31 DIAGNOSIS — C9000 Multiple myeloma not having achieved remission: Secondary | ICD-10-CM | POA: Diagnosis not present

## 2018-10-31 DIAGNOSIS — G8929 Other chronic pain: Secondary | ICD-10-CM | POA: Diagnosis not present

## 2018-10-31 LAB — CBC WITH DIFFERENTIAL/PLATELET
Abs Immature Granulocytes: 0.02 10*3/uL (ref 0.00–0.07)
Basophils Absolute: 0 10*3/uL (ref 0.0–0.1)
Basophils Relative: 1 %
Eosinophils Absolute: 0.1 10*3/uL (ref 0.0–0.5)
Eosinophils Relative: 2 %
HCT: 44.1 % (ref 36.0–46.0)
Hemoglobin: 14.7 g/dL (ref 12.0–15.0)
Immature Granulocytes: 0 %
Lymphocytes Relative: 35 %
Lymphs Abs: 1.8 10*3/uL (ref 0.7–4.0)
MCH: 33 pg (ref 26.0–34.0)
MCHC: 33.3 g/dL (ref 30.0–36.0)
MCV: 98.9 fL (ref 80.0–100.0)
Monocytes Absolute: 0.5 10*3/uL (ref 0.1–1.0)
Monocytes Relative: 9 %
Neutro Abs: 2.8 10*3/uL (ref 1.7–7.7)
Neutrophils Relative %: 53 %
Platelets: 223 10*3/uL (ref 150–400)
RBC: 4.46 MIL/uL (ref 3.87–5.11)
RDW: 12.4 % (ref 11.5–15.5)
WBC: 5.3 10*3/uL (ref 4.0–10.5)
nRBC: 0 % (ref 0.0–0.2)

## 2018-10-31 LAB — COMPREHENSIVE METABOLIC PANEL
ALT: 17 U/L (ref 0–44)
AST: 20 U/L (ref 15–41)
Albumin: 4.5 g/dL (ref 3.5–5.0)
Alkaline Phosphatase: 53 U/L (ref 38–126)
Anion gap: 11 (ref 5–15)
BUN: 19 mg/dL (ref 8–23)
CO2: 28 mmol/L (ref 22–32)
Calcium: 9.3 mg/dL (ref 8.9–10.3)
Chloride: 100 mmol/L (ref 98–111)
Creatinine, Ser: 0.8 mg/dL (ref 0.44–1.00)
GFR calc Af Amer: >60 mL/min (ref >60)
GFR calc non Af Amer: >60 mL/min (ref >60)
Glucose, Bld: 113 mg/dL — ABNORMAL HIGH (ref 70–99)
Potassium: 4.1 mmol/L (ref 3.5–5.1)
Sodium: 139 mmol/L (ref 135–145)
Total Bilirubin: 0.7 mg/dL (ref 0.3–1.2)
Total Protein: 7.7 g/dL (ref 6.5–8.1)

## 2018-10-31 LAB — LACTATE DEHYDROGENASE: LDH: 149 U/L (ref 98–192)

## 2018-11-02 LAB — PROTEIN ELECTROPHORESIS, SERUM
A/G Ratio: 1.4 (ref 0.7–1.7)
Albumin ELP: 4.3 g/dL (ref 2.9–4.4)
Alpha-1-Globulin: 0.1 g/dL (ref 0.0–0.4)
Alpha-2-Globulin: 0.9 g/dL (ref 0.4–1.0)
Beta Globulin: 1 g/dL (ref 0.7–1.3)
Gamma Globulin: 1 g/dL (ref 0.4–1.8)
Globulin, Total: 3 g/dL (ref 2.2–3.9)
Total Protein ELP: 7.3 g/dL (ref 6.0–8.5)

## 2018-11-02 LAB — KAPPA/LAMBDA LIGHT CHAINS
Kappa free light chain: 17.9 mg/L (ref 3.3–19.4)
Kappa, lambda light chain ratio: 1.35 (ref 0.26–1.65)
Lambda free light chains: 13.3 mg/L (ref 5.7–26.3)

## 2018-11-03 LAB — IMMUNOFIXATION ELECTROPHORESIS
IgA: 234 mg/dL (ref 87–352)
IgG (Immunoglobin G), Serum: 1109 mg/dL (ref 586–1602)
IgM (Immunoglobulin M), Srm: 59 mg/dL (ref 26–217)
Total Protein ELP: 7.4 g/dL (ref 6.0–8.5)

## 2018-11-03 LAB — BETA 2 MICROGLOBULIN, SERUM: Beta-2 Microglobulin: 1.8 mg/L (ref 0.6–2.4)

## 2018-11-08 ENCOUNTER — Inpatient Hospital Stay (HOSPITAL_COMMUNITY): Payer: 59 | Admitting: Hematology

## 2018-11-08 ENCOUNTER — Encounter (HOSPITAL_COMMUNITY): Payer: Self-pay | Admitting: Hematology

## 2018-11-08 ENCOUNTER — Other Ambulatory Visit: Payer: Self-pay

## 2018-11-08 VITALS — BP 116/59 | HR 81 | Temp 97.8°F | Resp 16 | Wt 142.9 lb

## 2018-11-08 DIAGNOSIS — G8929 Other chronic pain: Secondary | ICD-10-CM

## 2018-11-08 DIAGNOSIS — C9000 Multiple myeloma not having achieved remission: Secondary | ICD-10-CM | POA: Diagnosis not present

## 2018-11-08 DIAGNOSIS — M25511 Pain in right shoulder: Secondary | ICD-10-CM

## 2018-11-08 DIAGNOSIS — R209 Unspecified disturbances of skin sensation: Secondary | ICD-10-CM | POA: Diagnosis not present

## 2018-11-08 DIAGNOSIS — Z9484 Stem cells transplant status: Secondary | ICD-10-CM

## 2018-11-08 NOTE — Progress Notes (Signed)
 [facility] [address] [place], [place] [address]   CLINIC:  Medical Oncology/Hematology  PCP:  Luking, Scott A, MD [address] [place] [address] [phone]   REASON FOR VISIT: Follow-up for multiple myeloma  CURRENT THERAPY: Observation   INTERVAL HISTORY:  Melissa [name] 63 y.o. female returns for follow-up of multiple myeloma.  Denies any new onset bone pains.  Denies any fevers, night sweats or weight loss in the last 6 months.  Appetite is 100%.  Energy levels are 100%.  She had one episode of upper respiratory infection which was treated with antibiotics in January.  Pain in the right shoulder is reported as 2.  This is chronic pain.  Numbness in the toes is also stable.  REVIEW OF SYSTEMS:  Review of Systems  Neurological: Positive for numbness (feet).  All other systems reviewed and are negative.    PAST MEDICAL/SURGICAL HISTORY:  Past Medical History:  Diagnosis Date  . History of stem cell transplant (HCC)    for multiple myeloma  . Hypothyroidism   . Multiple myeloma   . Peripheral neuropathy   . Seasonal allergies   . Thyroid disease    Past Surgical History:  Procedure Laterality Date  . CATARACT EXTRACTION W/PHACO Right 04/04/2013   Procedure: CATARACT EXTRACTION PHACO AND INTRAOCULAR LENS PLACEMENT (IOC);  Surgeon: Mark T. Shapiro, MD;  Location: [facility];  Service: Ophthalmology;  Laterality: Right;  CDE:3.42  . CATARACT EXTRACTION W/PHACO Left 04/18/2013   Procedure: CATARACT EXTRACTION PHACO AND INTRAOCULAR LENS PLACEMENT (IOC);  Surgeon: Mark T. Shapiro, MD;  Location: [facility];  Service: Ophthalmology;  Laterality: Left;  CDE:4.91  . CESAREAN SECTION  06/1993  . FASCIECTOMY Left 03/02/2017   Procedure: FASCIECTOMY, LEFT SMALL FINGER;  Surgeon: Kuzma, Gary, MD;  Location: [facility];  Service: Orthopedics;  Laterality: Left;  . HAND SURGERY Left   . TONSILECTOMY, ADENOIDECTOMY, BILATERAL MYRINGOTOMY AND TUBES     . TONSILLECTOMY    . TUBAL LIGATION    . YAG LASER APPLICATION Right 07/21/2016   Procedure: YAG LASER APPLICATION;  Surgeon: Mark Shapiro, MD;  Location: [facility];  Service: Ophthalmology;  Laterality: Right;  . YAG LASER APPLICATION Left 08/04/2016   Procedure: YAG LASER APPLICATION;  Surgeon: Mark Shapiro, MD;  Location: [facility];  Service: Ophthalmology;  Laterality: Left;     SOCIAL HISTORY:  Social History   Socioeconomic History  . Marital status: Married    Spouse name: Not on file  . Number of children: Not on file  . Years of education: Not on file  . Highest education level: Not on file  Occupational History  . Not on file  Social Needs  . Financial resource strain: Not on file  . Food insecurity    Worry: Not on file    Inability: Not on file  . Transportation needs    Medical: Not on file    Non-medical: Not on file  Tobacco Use  . Smoking status: Never Smoker  . Smokeless tobacco: Never Used  Substance and Sexual Activity  . Alcohol use: Yes    Comment: occ  . Drug use: No  . Sexual activity: Not Currently    Birth control/protection: Post-menopausal  Lifestyle  . Physical activity    Days per week: Not on file    Minutes per session: Not on file  . Stress: Not on file  Relationships  . Social connections    Talks on phone: Not   on file    Gets together: Not on file    Attends religious service: Not on file    Active member of club or organization: Not on file    Attends meetings of clubs or organizations: Not on file    Relationship status: Not on file  . Intimate partner violence    Fear of current or ex partner: Not on file    Emotionally abused: Not on file    Physically abused: Not on file    Forced sexual activity: Not on file  Other Topics Concern  . Not on file  Social History Narrative  . Not on file    FAMILY HISTORY:  Family History  Problem Relation Age of Onset  . Hypertension Mother   . Heart attack Mother   . Diabetes Mother    . Skin cancer Mother   . Hypertension Father   . Heart attack Father   . Skin cancer Father   . Heart disease Maternal Grandmother   . Stroke Maternal Grandfather   . Hypertension Sister   . Skin cancer Sister   . Hypertension Brother   . Skin cancer Brother   . Hypertension Brother   . Cancer Brother        melanoma  . Skin cancer Brother   . Factor V Leiden deficiency Daughter     CURRENT MEDICATIONS:  Outpatient Encounter Medications as of 11/08/2018  Medication Sig  . Calcium Carbonate-Vitamin D (CALCIUM + D PO) Take 1 tablet by mouth daily.  . Cholecalciferol (VITAMIN D) 2000 UNITS tablet Take 2,000 Units by mouth daily.  . ibuprofen (ADVIL,MOTRIN) 200 MG tablet Take 200 mg by mouth as needed.  . levothyroxine (SYNTHROID, LEVOTHROID) 75 MCG tablet Take 1 tablet (75 mcg total) by mouth daily.  . [DISCONTINUED] doxycycline (VIBRA-TABS) 100 MG tablet Take 1 tablet (100 mg total) by mouth 2 (two) times daily. Take with a snack and a glass of water.   Facility-Administered Encounter Medications as of 11/08/2018  Medication  . 0.9 %  sodium chloride infusion  . 0.9 %  sodium chloride infusion  . sodium chloride 0.9 % injection 10 mL    ALLERGIES:  Allergies  Allergen Reactions  . Hydromorphone Hcl Nausea And Vomiting  . Poison Ivy Extract [Poison Ivy Extract]   . Tape Rash     PHYSICAL EXAM:  ECOG Performance status: 1  Vitals:   11/08/18 1105  BP: (!) 116/59  Pulse: 81  Resp: 16  Temp: 97.8 F (36.6 C)  SpO2: 99%   Filed Weights   11/08/18 1105  Weight: 142 lb 14.4 oz (64.8 kg)    Physical Exam Constitutional:      Appearance: She is well-developed.  Neck:     Musculoskeletal: Normal range of motion and neck supple.  Cardiovascular:     Rate and Rhythm: Normal rate and regular rhythm.     Heart sounds: Normal heart sounds.  Pulmonary:     Effort: Pulmonary effort is normal.     Breath sounds: Normal breath sounds.  Abdominal:     General: There  is no distension.     Palpations: Abdomen is soft. There is no mass.  Musculoskeletal: Normal range of motion.  Skin:    General: Skin is warm.  Neurological:     General: No focal deficit present.     Mental Status: She is alert and oriented to person, place, and time.  Psychiatric:        Mood and   Affect: Mood normal.        Behavior: Behavior normal.     LABORATORY DATA:  I have reviewed the labs as listed.  CBC    Component Value Date/Time   WBC 5.3 10/31/2018 1243   RBC 4.46 10/31/2018 1243   HGB 14.7 10/31/2018 1243   HCT 44.1 10/31/2018 1243   PLT 223 10/31/2018 1243   MCV 98.9 10/31/2018 1243   MCH 33.0 10/31/2018 1243   MCHC 33.3 10/31/2018 1243   RDW 12.4 10/31/2018 1243   LYMPHSABS 1.8 10/31/2018 1243   MONOABS 0.5 10/31/2018 1243   EOSABS 0.1 10/31/2018 1243   BASOSABS 0.0 10/31/2018 1243   CMP Latest Ref Rng & Units 10/31/2018 08/01/2018 04/18/2018  Glucose 70 - 99 mg/dL 113(H) 80 99  BUN 8 - 23 mg/dL 19 20 20  Creatinine 0.44 - 1.00 mg/dL 0.80 0.87 0.88  Sodium 135 - 145 mmol/L 139 140 139  Potassium 3.5 - 5.1 mmol/L 4.1 4.0 4.0  Chloride 98 - 111 mmol/L 100 104 105  CO2 22 - 32 mmol/L 28 29 29  Calcium 8.9 - 10.3 mg/dL 9.3 9.4 9.5  Total Protein 6.5 - 8.1 g/dL 7.7 7.3 7.2  Total Bilirubin 0.3 - 1.2 mg/dL 0.7 0.5 0.7  Alkaline Phos 38 - 126 U/L 53 60 46  AST 15 - 41 U/L 20 21 22  ALT 0 - 44 U/L 17 19 19    I have personally reviewed her scans.  ASSESSMENT & PLAN:   Multiple myeloma 1.  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 -Placed on Revlimid maintenance, dose reduced to 5 mg 1 week on, one-week off, discontinued in October 2014 secondary to intolerance including nausea and low blood counts. - Zometa was discontinued in August 2014. - Bone marrow biopsy on 04/07/2017 at [facility] was negative for any residual myeloma. - She denies any new onset  pains. - We reviewed blood work from 10/31/2018.  All the myeloma panel was within normal limits.  CBC and CMP was also normal. -Her previous right hip pain has resolved.  MRI on 05/05/2018 showed trace edema along the trochanteric bursa and adjacent to the distal gluteus medius tendon, suggesting minimal peritendinitis.  No active myeloma. - She is seeing Dr. Rodriguez every 2 years.  I will see her back in 6 months for follow-up with repeat myeloma panel.  She will also have myeloma panel checked again every 3 months.      Orders placed this encounter:  Orders Placed This Encounter  Procedures  . CBC with Differential/Platelet  . Comprehensive metabolic panel  . Protein electrophoresis, serum  . Kappa/lambda light chains  . Lactate dehydrogenase  . Immunofixation electrophoresis      Sreedhar Katragadda, MD [facility] [phone]  

## 2018-11-08 NOTE — Assessment & Plan Note (Signed)
1.  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 -Placed on Revlimid maintenance, dose reduced to 5 mg 1 week on, one-week off, discontinued in October 2014 secondary to intolerance including nausea and low blood counts. - Zometa was discontinued in August 2014. - Bone marrow biopsy on 04/07/2017 [facility] was negative for any residual myeloma. - She denies any new onset pains. - We reviewed blood work from 10/31/2018.  All the myeloma panel was within normal limits.  CBC and CMP was also normal. -Her previous right hip pain has resolved.  MRI on 05/05/2018 showed trace edema along the trochanteric bursa and adjacent to the distal gluteus medius tendon, suggesting minimal peritendinitis.  No active myeloma. - She is seeing Dr. Norma Fredrickson every 2 years.  I will see her back in 6 months for follow-up with repeat myeloma panel.  She will also have myeloma panel checked again every 3 months.

## 2018-11-08 NOTE — Patient Instructions (Addendum)
[facility] at [facility] Discharge Instructions  You were seen today by Dr. Katragadda. He went over your recent lab results. He will see you back in 6 months for labs and follow up.   Thank you for choosing [facility] at [facility] to provide your oncology and hematology care.  To afford each patient quality time with our provider, please arrive at least 15 minutes before your scheduled appointment time.   If you have a lab appointment with the Cancer Center please come in thru the  Main Entrance and check in at the main information desk  You need to re-schedule your appointment should you arrive 10 or more minutes late.  We strive to give you quality time with our providers, and arriving late affects you and other patients whose appointments are after yours.  Also, if you no show three or more times for appointments you may be dismissed from the clinic at the providers discretion.     Again, thank you for choosing [facility].  Our hope is that these requests will decrease the amount of time that you wait before being seen by our physicians.       _____________________________________________________________  Should you have questions after your visit to [facility], please contact our office at [phone] between the hours of 8:00 a.m. and 4:30 p.m.  Voicemails left after 4:00 p.m. will not be returned until the following business day.  For prescription refill requests, have your pharmacy contact our office and allow 72 hours.    Cancer Center Support Programs:   > Cancer Support Group  2nd Tuesday of the month 1pm-2pm, Journey Room    

## 2018-11-22 DIAGNOSIS — H52203 Unspecified astigmatism, bilateral: Secondary | ICD-10-CM | POA: Diagnosis not present

## 2018-11-22 DIAGNOSIS — H5203 Hypermetropia, bilateral: Secondary | ICD-10-CM | POA: Diagnosis not present

## 2018-11-22 DIAGNOSIS — H524 Presbyopia: Secondary | ICD-10-CM | POA: Diagnosis not present

## 2019-02-07 ENCOUNTER — Encounter: Payer: Self-pay | Admitting: Family Medicine

## 2019-02-07 MED ORDER — TRIAMCINOLONE ACETONIDE 0.1 % EX CREA: TOPICAL_CREAM | CUTANEOUS | 1 refills | Status: DC

## 2019-02-08 ENCOUNTER — Inpatient Hospital Stay (HOSPITAL_COMMUNITY): Payer: 59 | Attending: Hematology

## 2019-02-08 ENCOUNTER — Other Ambulatory Visit: Payer: Self-pay

## 2019-02-08 DIAGNOSIS — C9000 Multiple myeloma not having achieved remission: Secondary | ICD-10-CM | POA: Diagnosis not present

## 2019-02-08 LAB — COMPREHENSIVE METABOLIC PANEL
ALT: 28 U/L (ref 0–44)
AST: 34 U/L (ref 15–41)
Albumin: 4 g/dL (ref 3.5–5.0)
Alkaline Phosphatase: 57 U/L (ref 38–126)
Anion gap: 9 (ref 5–15)
BUN: 18 mg/dL (ref 8–23)
CO2: 28 mmol/L (ref 22–32)
Calcium: 9.6 mg/dL (ref 8.9–10.3)
Chloride: 103 mmol/L (ref 98–111)
Creatinine, Ser: 0.87 mg/dL (ref 0.44–1.00)
GFR calc Af Amer: >60 mL/min (ref >60)
GFR calc non Af Amer: >60 mL/min (ref >60)
Glucose, Bld: 100 mg/dL — ABNORMAL HIGH (ref 70–99)
Potassium: 4.2 mmol/L (ref 3.5–5.1)
Sodium: 140 mmol/L (ref 135–145)
Total Bilirubin: 0.5 mg/dL (ref 0.3–1.2)
Total Protein: 7.7 g/dL (ref 6.5–8.1)

## 2019-02-08 LAB — CBC WITH DIFFERENTIAL/PLATELET
Abs Immature Granulocytes: 0.03 10*3/uL (ref 0.00–0.07)
Basophils Absolute: 0 10*3/uL (ref 0.0–0.1)
Basophils Relative: 1 %
Eosinophils Absolute: 0.1 10*3/uL (ref 0.0–0.5)
Eosinophils Relative: 2 %
HCT: 40.1 % (ref 36.0–46.0)
Hemoglobin: 13.3 g/dL (ref 12.0–15.0)
Immature Granulocytes: 1 %
Lymphocytes Relative: 24 %
Lymphs Abs: 0.8 10*3/uL (ref 0.7–4.0)
MCH: 33.3 pg (ref 26.0–34.0)
MCHC: 33.2 g/dL (ref 30.0–36.0)
MCV: 100.3 fL — ABNORMAL HIGH (ref 80.0–100.0)
Monocytes Absolute: 0.4 10*3/uL (ref 0.1–1.0)
Monocytes Relative: 13 %
Neutro Abs: 2 10*3/uL (ref 1.7–7.7)
Neutrophils Relative %: 59 %
Platelets: 209 10*3/uL (ref 150–400)
RBC: 4 MIL/uL (ref 3.87–5.11)
RDW: 12 % (ref 11.5–15.5)
WBC: 3.3 10*3/uL — ABNORMAL LOW (ref 4.0–10.5)
nRBC: 0 % (ref 0.0–0.2)

## 2019-02-08 LAB — LACTATE DEHYDROGENASE: LDH: 389 U/L — ABNORMAL HIGH (ref 98–192)

## 2019-02-09 LAB — KAPPA/LAMBDA LIGHT CHAINS
Kappa free light chain: 94.1 mg/L — ABNORMAL HIGH (ref 3.3–19.4)
Kappa, lambda light chain ratio: 5.5 — ABNORMAL HIGH (ref 0.26–1.65)
Lambda free light chains: 17.1 mg/L (ref 5.7–26.3)

## 2019-02-09 LAB — PROTEIN ELECTROPHORESIS, SERUM
A/G Ratio: 1.3 (ref 0.7–1.7)
Albumin ELP: 4 g/dL (ref 2.9–4.4)
Alpha-1-Globulin: 0.2 g/dL (ref 0.0–0.4)
Alpha-2-Globulin: 1 g/dL (ref 0.4–1.0)
Beta Globulin: 0.8 g/dL (ref 0.7–1.3)
Gamma Globulin: 1.2 g/dL (ref 0.4–1.8)
Globulin, Total: 3.2 g/dL (ref 2.2–3.9)
M-Spike, %: 0.4 g/dL — ABNORMAL HIGH
Total Protein ELP: 7.2 g/dL (ref 6.0–8.5)

## 2019-02-13 LAB — IMMUNOFIXATION ELECTROPHORESIS
IgA: 423 mg/dL — ABNORMAL HIGH (ref 87–352)
IgG (Immunoglobin G), Serum: 954 mg/dL (ref 586–1602)
IgM (Immunoglobulin M), Srm: 60 mg/dL (ref 26–217)
Total Protein ELP: 6.9 g/dL (ref 6.0–8.5)

## 2019-02-13 MED FILL — LEVOTHYROXINE 75 MCG TABLET: 75 | 90 days supply | Qty: 90 | Fill #2

## 2019-02-17 ENCOUNTER — Other Ambulatory Visit (HOSPITAL_COMMUNITY): Payer: Self-pay | Admitting: *Deleted

## 2019-02-17 ENCOUNTER — Encounter (HOSPITAL_COMMUNITY): Payer: Self-pay

## 2019-02-17 DIAGNOSIS — C9000 Multiple myeloma not having achieved remission: Secondary | ICD-10-CM

## 2019-02-20 ENCOUNTER — Other Ambulatory Visit (HOSPITAL_COMMUNITY): Payer: Self-pay | Admitting: *Deleted

## 2019-02-20 ENCOUNTER — Other Ambulatory Visit (HOSPITAL_COMMUNITY): Payer: Self-pay | Admitting: Hematology

## 2019-02-20 DIAGNOSIS — C9000 Multiple myeloma not having achieved remission: Secondary | ICD-10-CM

## 2019-02-21 ENCOUNTER — Other Ambulatory Visit: Payer: Self-pay

## 2019-02-21 DIAGNOSIS — Z20822 Contact with and (suspected) exposure to covid-19: Secondary | ICD-10-CM

## 2019-02-23 LAB — NOVEL CORONAVIRUS, NAA

## 2019-02-28 ENCOUNTER — Ambulatory Visit (HOSPITAL_COMMUNITY): Payer: 59

## 2019-03-02 ENCOUNTER — Encounter (HOSPITAL_COMMUNITY): Payer: Self-pay

## 2019-03-06 ENCOUNTER — Other Ambulatory Visit (HOSPITAL_COMMUNITY): Payer: Self-pay | Admitting: Nurse Practitioner

## 2019-03-06 ENCOUNTER — Encounter (HOSPITAL_COMMUNITY): Payer: Self-pay

## 2019-03-10 ENCOUNTER — Ambulatory Visit (HOSPITAL_COMMUNITY)
Admission: RE | Admit: 2019-03-10 | Discharge: 2019-03-10 | Disposition: A | Discharge status: Home / Self Care | Payer: 59 | Source: Ambulatory Visit | Attending: Hematology | Admitting: Hematology

## 2019-03-10 ENCOUNTER — Other Ambulatory Visit: Payer: Self-pay

## 2019-03-10 DIAGNOSIS — C9000 Multiple myeloma not having achieved remission: Secondary | ICD-10-CM | POA: Insufficient documentation

## 2019-03-10 LAB — GLUCOSE, CAPILLARY: Glucose-Capillary: 94 mg/dL (ref 70–99)

## 2019-03-10 MED ORDER — FLUDEOXYGLUCOSE F - 18 (FDG) INJECTION
6.8400 | Freq: Once | INTRAVENOUS | Status: AC
  Administered 2019-03-10: 6.84 via INTRAVENOUS

## 2019-03-12 ENCOUNTER — Other Ambulatory Visit: Payer: Self-pay | Admitting: Radiology

## 2019-03-13 ENCOUNTER — Other Ambulatory Visit: Payer: Self-pay | Admitting: Radiology

## 2019-03-13 ENCOUNTER — Encounter (HOSPITAL_COMMUNITY): Payer: Self-pay | Admitting: *Deleted

## 2019-03-13 DIAGNOSIS — C9002 Multiple myeloma in relapse: Secondary | ICD-10-CM | POA: Diagnosis not present

## 2019-03-13 DIAGNOSIS — C9000 Multiple myeloma not having achieved remission: Secondary | ICD-10-CM | POA: Diagnosis not present

## 2019-03-14 ENCOUNTER — Encounter (HOSPITAL_COMMUNITY): Payer: Self-pay

## 2019-03-14 ENCOUNTER — Ambulatory Visit (HOSPITAL_COMMUNITY)
Admission: RE | Admit: 2019-03-14 | Discharge: 2019-03-14 | Disposition: A | Discharge status: Home / Self Care | Payer: 59 | Source: Ambulatory Visit | Attending: Hematology | Admitting: Hematology

## 2019-03-14 ENCOUNTER — Other Ambulatory Visit: Payer: Self-pay

## 2019-03-14 DIAGNOSIS — Z91048 Other nonmedicinal substance allergy status: Secondary | ICD-10-CM | POA: Insufficient documentation

## 2019-03-14 DIAGNOSIS — D539 Nutritional anemia, unspecified: Secondary | ICD-10-CM | POA: Diagnosis not present

## 2019-03-14 DIAGNOSIS — I2510 Atherosclerotic heart disease of native coronary artery without angina pectoris: Secondary | ICD-10-CM | POA: Insufficient documentation

## 2019-03-14 DIAGNOSIS — I700 Atherosclerosis of aorta: Secondary | ICD-10-CM | POA: Insufficient documentation

## 2019-03-14 DIAGNOSIS — E039 Hypothyroidism, unspecified: Secondary | ICD-10-CM | POA: Diagnosis not present

## 2019-03-14 DIAGNOSIS — Z79890 Hormone replacement therapy (postmenopausal): Secondary | ICD-10-CM | POA: Diagnosis not present

## 2019-03-14 DIAGNOSIS — Z79899 Other long term (current) drug therapy: Secondary | ICD-10-CM | POA: Insufficient documentation

## 2019-03-14 DIAGNOSIS — C9000 Multiple myeloma not having achieved remission: Secondary | ICD-10-CM | POA: Diagnosis not present

## 2019-03-14 DIAGNOSIS — Z888 Allergy status to other drugs, medicaments and biological substances status: Secondary | ICD-10-CM | POA: Diagnosis not present

## 2019-03-14 DIAGNOSIS — Z886 Allergy status to analgesic agent status: Secondary | ICD-10-CM | POA: Diagnosis not present

## 2019-03-14 DIAGNOSIS — G629 Polyneuropathy, unspecified: Secondary | ICD-10-CM | POA: Diagnosis not present

## 2019-03-14 LAB — CBC WITH DIFFERENTIAL/PLATELET
Abs Immature Granulocytes: 0.19 10*3/uL — ABNORMAL HIGH (ref 0.00–0.07)
Basophils Absolute: 0 10*3/uL (ref 0.0–0.1)
Basophils Relative: 1 %
Eosinophils Absolute: 0.1 10*3/uL (ref 0.0–0.5)
Eosinophils Relative: 2 %
HCT: 36.5 % (ref 36.0–46.0)
Hemoglobin: 11.9 g/dL — ABNORMAL LOW (ref 12.0–15.0)
Immature Granulocytes: 5 %
Lymphocytes Relative: 29 %
Lymphs Abs: 1.2 10*3/uL (ref 0.7–4.0)
MCH: 33.1 pg (ref 26.0–34.0)
MCHC: 32.6 g/dL (ref 30.0–36.0)
MCV: 101.4 fL — ABNORMAL HIGH (ref 80.0–100.0)
Monocytes Absolute: 0.6 10*3/uL (ref 0.1–1.0)
Monocytes Relative: 14 %
Neutro Abs: 2 10*3/uL (ref 1.7–7.7)
Neutrophils Relative %: 49 %
Platelets: 158 10*3/uL (ref 150–400)
RBC: 3.6 MIL/uL — ABNORMAL LOW (ref 3.87–5.11)
RDW: 12.6 % (ref 11.5–15.5)
WBC: 4.1 10*3/uL (ref 4.0–10.5)
nRBC: 0 % (ref 0.0–0.2)

## 2019-03-14 LAB — PROTIME-INR
INR: 1.1 (ref 0.8–1.2)
Prothrombin Time: 13.8 seconds (ref 11.4–15.2)

## 2019-03-14 MED ORDER — NALOXONE HCL 0.4 MG/ML IJ SOLN
INTRAMUSCULAR | Status: AC
  Filled 2019-03-14: qty 1

## 2019-03-14 MED ORDER — FENTANYL CITRATE (PF) 100 MCG/2ML IJ SOLN
INTRAMUSCULAR | Status: AC
  Filled 2019-03-14: qty 2

## 2019-03-14 MED ORDER — MIDAZOLAM HCL 2 MG/2ML IJ SOLN
INTRAMUSCULAR | Status: AC
  Filled 2019-03-14: qty 4

## 2019-03-14 MED ORDER — MIDAZOLAM HCL 2 MG/2ML IJ SOLN
INTRAMUSCULAR | Status: AC | PRN
  Administered 2019-03-14 (×2): 1 mg via INTRAVENOUS

## 2019-03-14 MED ORDER — FLUMAZENIL 0.5 MG/5ML IV SOLN
INTRAVENOUS | Status: AC
  Filled 2019-03-14: qty 5

## 2019-03-14 MED ORDER — FENTANYL CITRATE (PF) 100 MCG/2ML IJ SOLN
INTRAMUSCULAR | Status: AC | PRN
  Administered 2019-03-14: 50 ug via INTRAVENOUS

## 2019-03-14 MED ORDER — LIDOCAINE HCL (PF) 1 % IJ SOLN
INTRAMUSCULAR | Status: AC | PRN
  Administered 2019-03-14: 10 mL

## 2019-03-14 MED ORDER — SODIUM CHLORIDE 0.9 % IV SOLN
INTRAVENOUS | Status: DC
  Administered 2019-03-14: 09:00:00 via INTRAVENOUS

## 2019-03-14 NOTE — Discharge Instructions (Signed)
Bone Marrow Aspiration and Bone Marrow Biopsy, Adult, Care After °This sheet gives you information about how to care for yourself after your procedure. Your health care provider may also give you more specific instructions. If you have problems or questions, contact your health care provider. °What can I expect after the procedure? °After the procedure, it is common to have: °· Mild pain and tenderness. °· Swelling. °· Bruising. °Follow these instructions at home: °Puncture site care ° °  ° °· Follow instructions from your health care provider about how to take care of the puncture site. Make sure you: °? Wash your hands with soap and water before you change your bandage (dressing). If soap and water are not available, use hand sanitizer. °? Change your dressing as told by your health care provider. °· Check your puncture site every day for signs of infection. Check for: °? More redness, swelling, or pain. °? More fluid or blood. °? Warmth. °? Pus or a bad smell. °General instructions °· Take over-the-counter and prescription medicines only as told by your health care provider. °· Do not take baths, swim, or use a hot tub until your health care provider approves. Ask if you can take a shower or have a sponge bath. °· Return to your normal activities as told by your health care provider. Ask your health care provider what activities are safe for you. °· Do not drive for 24 hours if you were given a medicine to help you relax (sedative) during your procedure. °· Keep all follow-up visits as told by your health care provider. This is important. °Contact a health care provider if: °· Your pain is not controlled with medicine. °Get help right away if: °· You have a fever. °· You have more redness, swelling, or pain around the puncture site. °· You have more fluid or blood coming from the puncture site. °· Your puncture site feels warm to the touch. °· You have pus or a bad smell coming from the puncture site. °These  symptoms may represent a serious problem that is an emergency. Do not wait to see if the symptoms will go away. Get medical help right away. Call your local emergency services (911 in the [place] Do not drive yourself to the hospital. °Summary °· After the procedure, it is common to have mild pain, tenderness, swelling, and bruising. °· Follow instructions from your health care provider about how to take care of the puncture site. °· Get help right away if you have any symptoms of infection or if you have more blood or fluid coming from the puncture site. °This information is not intended to replace advice given to you by your health care provider. Make sure you discuss any questions you have with your health care provider. °Document Released: 10/31/2004 Document Revised: 07/27/2017 Document Reviewed: 09/25/2015 °Elsevier Patient Education © 2020 [employer] ° ° ° ° °Moderate Conscious Sedation, Adult, Care After °These instructions provide you with information about caring for yourself after your procedure. Your health care provider may also give you more specific instructions. Your treatment has been planned according to current medical practices, but problems sometimes occur. Call your health care provider if you have any problems or questions after your procedure. °What can I expect after the procedure? °After your procedure, it is common: °· To feel sleepy for several hours. °· To feel clumsy and have poor balance for several hours. °· To have poor judgment for several hours. °· To vomit if you eat too soon. °  Follow these instructions at home: °For at least 24 hours after the procedure: ° °· Do not: °? Participate in activities where you could fall or become injured. °? Drive. °? Use heavy machinery. °? Drink alcohol. °? Take sleeping pills or medicines that cause drowsiness. °? Make important decisions or sign legal documents. °? Take care of children on your own. °· Rest. °Eating and drinking °· Follow the  diet recommended by your health care provider. °· If you vomit: °? Drink water, juice, or soup when you can drink without vomiting. °? Make sure you have little or no nausea before eating solid foods. °General instructions °· Have a responsible adult stay with you until you are awake and alert. °· Take over-the-counter and prescription medicines only as told by your health care provider. °· If you smoke, do not smoke without supervision. °· Keep all follow-up visits as told by your health care provider. This is important. °Contact a health care provider if: °· You keep feeling nauseous or you keep vomiting. °· You feel light-headed. °· You develop a rash. °· You have a fever. °Get help right away if: °· You have trouble breathing. °This information is not intended to replace advice given to you by your health care provider. Make sure you discuss any questions you have with your health care provider. °Document Released: 02/01/2013 Document Revised: 03/26/2017 Document Reviewed: 08/03/2015 °Elsevier Patient Education © 2020 [employer] ° °

## 2019-03-14 NOTE — Procedures (Signed)
Pre-procedure Diagnosis: Multiple Myeloma Post-procedure Diagnosis: Same  Technically successful CT guided bone marrow aspiration and biopsy of left iliac crest.   Complications: None Immediate  EBL: None  Signed: John Watts Pager: [phone] 03/14/2019, 9:15 AM    

## 2019-03-14 NOTE — H&P (Signed)
Referring Physician(s): Katragadda,Sreedhar  Supervising Physician: Sandi Mariscal  Patient Status:  [facility]  Chief Complaint:  "I'm having a bone marrow biopsy"  Subjective: Patient familiar to IR service from T6 biopsy in 2010. She has a history of multiple myeloma and is status post treatment along with stem cell transplant in 2010.  Recent PET scan reveals :  1. Extensive patchy osseous hypermetabolism throughout the axial and proximal appendicular skeleton, compatible with widespread metabolically active multiple myeloma. 2. Mildly hypermetabolic spleen, which is normal size. 3. Chronic findings include: Aortic Atherosclerosis (ICD10-I70.0). Coronary atherosclerosis. Cholelithiasis. Mild diffuse hepatic Steatosis  She presents today for CT-guided bone marrow biopsy for further evaluation.  She currently denies fever, headache, chest pain, dyspnea, cough, abdominal/back pain, nausea, vomiting or bleeding.  Past Medical History:  Diagnosis Date  . History of stem cell transplant ([place])    for multiple myeloma  . Hypothyroidism   . Multiple myeloma   . Peripheral neuropathy   . Seasonal allergies   . Thyroid disease    Past Surgical History:  Procedure Laterality Date  . CATARACT EXTRACTION W/PHACO Right 04/04/2013   Procedure: CATARACT EXTRACTION PHACO AND INTRAOCULAR LENS PLACEMENT (IOC);  Surgeon: Elta Guadeloupe T. Gershon Crane, MD;  Location: [facility];  Service: Ophthalmology;  Laterality: Right;  CDE:3.42  . CATARACT EXTRACTION W/PHACO Left 04/18/2013   Procedure: CATARACT EXTRACTION PHACO AND INTRAOCULAR LENS PLACEMENT (IOC);  Surgeon: Elta Guadeloupe T. Gershon Crane, MD;  Location: [facility];  Service: Ophthalmology;  Laterality: Left;  CDE:4.91  . CESAREAN SECTION  06/1993  . FASCIECTOMY Left 03/02/2017   Procedure: FASCIECTOMY, LEFT SMALL FINGER;  Surgeon: Daryll Brod, MD;  Location: [facility];  Service: Orthopedics;  Laterality: Left;  . HAND SURGERY Left   . TONSILECTOMY,  ADENOIDECTOMY, BILATERAL MYRINGOTOMY AND TUBES    . TONSILLECTOMY    . TUBAL LIGATION    . YAG LASER APPLICATION Right 5/95/6387   Procedure: YAG LASER APPLICATION;  Surgeon: Rutherford Guys, MD;  Location: [facility];  Service: Ophthalmology;  Laterality: Right;  . YAG LASER APPLICATION Left 5/64/3329   Procedure: YAG LASER APPLICATION;  Surgeon: Rutherford Guys, MD;  Location: [facility];  Service: Ophthalmology;  Laterality: Left;      Allergies: Hydromorphone hcl, Poison ivy extract [poison ivy extract], and Tape  Medications: Prior to Admission medications   Medication Sig Start Date End Date Taking? Authorizing Provider  levothyroxine (SYNTHROID, LEVOTHROID) 75 MCG tablet Take 1 tablet (75 mcg total) by mouth daily. 04/04/18  Yes Kathyrn Drown, MD  Calcium Carbonate-Vitamin D (CALCIUM + D PO) Take 1 tablet by mouth daily.    [provider]  Cholecalciferol (VITAMIN D) 2000 UNITS tablet Take 2,000 Units by mouth daily.    [provider]  ibuprofen (ADVIL,MOTRIN) 200 MG tablet Take 200 mg by mouth as needed.    [provider]  triamcinolone cream (KENALOG) 0.1 % Apply BID to affected areas 02/07/19   Mikey Kirschner, MD     Vital Signs: BP (!) 130/56 (BP Location: Right Arm)   Pulse 89   Temp 98.1 F (36.7 C) (Oral)   Resp 18   SpO2 100%   Physical Exam awake, alert.  Chest clear to auscultation bilaterally.  Heart with regular rate and rhythm.  Abdomen soft, positive bowel sounds, nontender.  No lower extremity edema.  Imaging: Nm Pet Image Initial (pi) Whole Body  Result Date: 03/10/2019 CLINICAL DATA:  Initial treatment strategy for multiple myeloma having not achieved remission.  EXAM: NUCLEAR MEDICINE PET WHOLE BODY TECHNIQUE: 6.8 mCi F-18 FDG was injected intravenously. Full-ring PET imaging was performed from the skull base to thigh after the radiotracer. CT data was obtained and used for attenuation correction and anatomic localization. Fasting  blood glucose: 94 mg/dl COMPARISON:  10/09/2008 bone survey. FINDINGS: Mediastinal blood pool activity: SUV max 3.0 HEAD/NECK: No hypermetabolic activity in the scalp. No hypermetabolic cervical lymph nodes. Incidental CT findings: none CHEST: No enlarged or hypermetabolic axillary, mediastinal or hilar lymph nodes. No hypermetabolic pulmonary findings. Incidental CT findings: No acute consolidative airspace disease or lung masses. Peripheral left upper lobe 3 mm pulmonary nodule (series 8/image 16), below PET resolution. No additional significant pulmonary nodules. Mosaic attenuation throughout both lungs, most commonly due to air trapping. Coronary atherosclerosis. Atherosclerotic nonaneurysmal thoracic aorta. ABDOMEN/PELVIS: No abnormal hypermetabolic activity within the liver, pancreas, adrenal glands, or spleen. No hypermetabolic lymph nodes in the abdomen or pelvis. Mild diffuse splenic hypermetabolism (spleen max SUV 4.5 compared to liver max SUV 3.7). No splenic masses. Normal size spleen. Incidental CT findings: Cholelithiasis. Mild diffuse hepatic steatosis. Atherosclerotic nonaneurysmal abdominal aorta. Minimal sigmoid diverticulosis. SKELETON: There is extensive patchy osseous hypermetabolism throughout spine, sternum, bilateral ribs, clavicles, shoulder girdles and pelvic girdle bilaterally without discrete lesions on the CT images. Representative osseous lesions as follows: -left manubrial lesion with max SUV 9.0 -proximal right humeral lesion with max SUV 17.3 -anterior right second rib lesion with max SUV 6.0 -medial right iliac bone lesion with max SUV 16.8 -left femoral neck lesion with max SUV 19.2 -posterior element T1 lesion with max SUV 11.3 Incidental CT findings: Chronic T6 and L3 vertebral compression fractures. EXTREMITIES: There is patchy hypermetabolism in the proximal and diaphyseal femora bilaterally, without discrete lesions on the CT images. Representative left femoral neck lesion  with max SUV 19.2. Incidental CT findings: none IMPRESSION: 1. Extensive patchy osseous hypermetabolism throughout the axial and proximal appendicular skeleton, compatible with widespread metabolically active multiple myeloma. 2. Mildly hypermetabolic spleen, which is normal size. 3. Chronic findings include: Aortic Atherosclerosis (ICD10-I70.0). Coronary atherosclerosis. Cholelithiasis. Mild diffuse hepatic steatosis. Electronically Signed   By: Ilona Sorrel M.D.   On: 03/10/2019 12:34    Labs:  CBC: Recent Labs    04/18/18 0810 08/01/18 1055 10/31/18 1243 02/08/19 0948  WBC 5.2 4.5 5.3 3.3*  HGB 13.6 14.0 14.7 13.3  HCT 41.8 41.6 44.1 40.1  PLT 228 222 223 209    COAGS: No results for input(s): INR, APTT in the last 8760 hours.  BMP: Recent Labs    04/18/18 0810 08/01/18 1055 10/31/18 1243 02/08/19 0948  NA 139 140 139 140  K 4.0 4.0 4.1 4.2  CL 105 104 100 103  CO2 29 29 28 28   GLUCOSE 99 80 113* 100*  BUN 20 20 19 18   CALCIUM 9.5 9.4 9.3 9.6  CREATININE 0.88 0.87 0.80 0.87  GFRNONAA >60 >60 >60 >60  GFRAA >60 >60 >60 >60    LIVER FUNCTION TESTS: Recent Labs    04/18/18 0810 08/01/18 1055 10/31/18 1243 02/08/19 0948  BILITOT 0.7 0.5 0.7 0.5  AST 22 21 20  34  ALT 19 19 17 28   ALKPHOS 46 60 53 57  PROT 7.2 7.3 7.7 7.7  ALBUMIN 4.1 4.1 4.5 4.0    Assessment and Plan: Pt with history of multiple myeloma ; status post treatment along with stem cell transplant in 2010.  Recent PET scan reveals :  1. Extensive patchy osseous hypermetabolism throughout the axial and  proximal appendicular skeleton, compatible with widespread metabolically active multiple myeloma. 2. Mildly hypermetabolic spleen, which is normal size. 3. Chronic findings include: Aortic Atherosclerosis (ICD10-I70.0). Coronary atherosclerosis. Cholelithiasis. Mild diffuse hepatic Steatosis  She presents today for CT-guided bone marrow biopsy for further evaluation. Risks and benefits of  procedure was discussed with the patient  including, but not limited to bleeding, infection, damage to adjacent structures or low yield requiring additional tests.  All of the questions were answered and there is agreement to proceed.  Consent signed and in chart.  LABS PENDING   Electronically Signed: D. Rowe Robert, PA-C 03/14/2019, 8:24 AM   I spent a total of 20 minutes at the the patient's bedside AND on the patient's hospital floor or unit, greater than 50% of which was counseling/coordinating care for CT-guided bone marrow biopsy

## 2019-03-18 LAB — SURGICAL PATHOLOGY

## 2019-03-21 ENCOUNTER — Other Ambulatory Visit: Payer: Self-pay

## 2019-03-21 ENCOUNTER — Ambulatory Visit (HOSPITAL_COMMUNITY)
Admission: RE | Admit: 2019-03-21 | Discharge: 2019-03-21 | Disposition: A | Discharge status: Home / Self Care | Payer: 59 | Source: Ambulatory Visit | Attending: Hematology | Admitting: Hematology

## 2019-03-21 ENCOUNTER — Inpatient Hospital Stay (HOSPITAL_COMMUNITY): Payer: 59 | Attending: Hematology | Admitting: Hematology

## 2019-03-21 ENCOUNTER — Encounter (HOSPITAL_COMMUNITY): Payer: Self-pay | Admitting: Hematology

## 2019-03-21 VITALS — BP 132/75 | HR 120 | Temp 97.4°F | Resp 16 | Wt 139.0 lb

## 2019-03-21 DIAGNOSIS — R29898 Other symptoms and signs involving the musculoskeletal system: Secondary | ICD-10-CM

## 2019-03-21 DIAGNOSIS — M4803 Spinal stenosis, cervicothoracic region: Secondary | ICD-10-CM | POA: Diagnosis not present

## 2019-03-21 DIAGNOSIS — C9002 Multiple myeloma in relapse: Secondary | ICD-10-CM | POA: Diagnosis not present

## 2019-03-21 DIAGNOSIS — M5124 Other intervertebral disc displacement, thoracic region: Secondary | ICD-10-CM | POA: Diagnosis not present

## 2019-03-21 DIAGNOSIS — M4804 Spinal stenosis, thoracic region: Secondary | ICD-10-CM | POA: Diagnosis not present

## 2019-03-21 DIAGNOSIS — M5134 Other intervertebral disc degeneration, thoracic region: Secondary | ICD-10-CM | POA: Diagnosis not present

## 2019-03-21 DIAGNOSIS — M50223 Other cervical disc displacement at C6-C7 level: Secondary | ICD-10-CM | POA: Diagnosis not present

## 2019-03-21 MED ORDER — OXYCODONE-ACETAMINOPHEN 10-325 MG PO TABS: 1.0000 | ORAL_TABLET | Freq: Three times a day (TID) | ORAL | 0 refills | Status: DC | PRN

## 2019-03-21 MED ORDER — GADOBUTROL 1 MMOL/ML IV SOLN
7.0000 mL | Freq: Once | INTRAVENOUS | Status: AC | PRN
  Administered 2019-03-21: 7 mL via INTRAVENOUS

## 2019-03-21 NOTE — Progress Notes (Signed)
[address] [place], [place] [address]   CLINIC:  Medical Oncology/Hematology  PCP:  Kathyrn Drown, MD [address] [place] [address] [phone]   REASON FOR VISIT: Follow-up for multiple myeloma  CURRENT THERAPY: Under work-up for active therapy.   INTERVAL HISTORY:  [name] 63 y.o. female seen for follow-up of recurrent multiple myeloma.  She reported extreme pain in the left shoulder for the last 4 days.  She rates it 9 out of 10 in intensity.  She is taking hydrocodone 5 mg at bedtime which is not completely helping but allowing her to sleep.  Appetite is 100%.  Energy levels are 25%.  She also reported numbness in the chin region as well as numbness in the left forearm which started within the last couple of days.  Has mild weakness in the left hand.  No leg weakness reported.  No loss of bowel or bladder function.  No headaches reported.  She had labs drawn in October which showed resurgence of M spike.  REVIEW OF SYSTEMS:  Review of Systems  Musculoskeletal:       Left shoulder pain reported.  Neurological: Positive for numbness (In the chin and left forearm.).  All other systems reviewed and are negative.    PAST MEDICAL/SURGICAL HISTORY:  Past Medical History:  Diagnosis Date  . History of stem cell transplant ([place] [facility])    for multiple myeloma  . Hypothyroidism   . Multiple myeloma   . Peripheral neuropathy   . Seasonal allergies   . Thyroid disease    Past Surgical History:  Procedure Laterality Date  . CATARACT EXTRACTION W/PHACO Right 04/04/2013   Procedure: CATARACT EXTRACTION PHACO AND INTRAOCULAR LENS PLACEMENT (IOC);  Surgeon: Elta Guadeloupe T. Gershon Crane, MD;  Location: [facility];  Service: Ophthalmology;  Laterality: Right;  CDE:3.42  . CATARACT EXTRACTION W/PHACO Left 04/18/2013   Procedure: CATARACT EXTRACTION PHACO AND INTRAOCULAR LENS PLACEMENT (IOC);  Surgeon: Elta Guadeloupe T. Gershon Crane, MD;  Location: [facility];  Service:  Ophthalmology;  Laterality: Left;  CDE:4.91  . CESAREAN SECTION  06/1993  . FASCIECTOMY Left 03/02/2017   Procedure: FASCIECTOMY, LEFT SMALL FINGER;  Surgeon: Daryll Brod, MD;  Location: [facility];  Service: Orthopedics;  Laterality: Left;  . HAND SURGERY Left   . TONSILECTOMY, ADENOIDECTOMY, BILATERAL MYRINGOTOMY AND TUBES    . TONSILLECTOMY    . TUBAL LIGATION    . YAG LASER APPLICATION Right 2/84/1324   Procedure: YAG LASER APPLICATION;  Surgeon: Rutherford Guys, MD;  Location: [facility];  Service: Ophthalmology;  Laterality: Right;  . YAG LASER APPLICATION Left 07/27/270   Procedure: YAG LASER APPLICATION;  Surgeon: Rutherford Guys, MD;  Location: [facility];  Service: Ophthalmology;  Laterality: Left;     SOCIAL HISTORY:  Social History   Socioeconomic History  . Marital status: Married    Spouse name: Not on file  . Number of children: Not on file  . Years of education: Not on file  . Highest education level: Not on file  Occupational History  . Not on file  Social Needs  . Financial resource strain: Not on file  . Food insecurity    Worry: Not on file    Inability: Not on file  . Transportation needs    Medical: Not on file    Non-medical: Not on file  Tobacco Use  . Smoking status: Never Smoker  . Smokeless tobacco: Never Used  Substance and Sexual Activity  .  Alcohol use: Yes    Comment: occ  . Drug use: No  . Sexual activity: Not Currently    Birth control/protection: Post-menopausal  Lifestyle  . Physical activity    Days per week: Not on file    Minutes per session: Not on file  . Stress: Not on file  Relationships  . Social Herbalist on phone: Not on file    Gets together: Not on file    Attends religious service: Not on file    Active member of club or organization: Not on file    Attends meetings of clubs or organizations: Not on file    Relationship status: Not on file  . Intimate partner violence    Fear of current or ex partner:  Not on file    Emotionally abused: Not on file    Physically abused: Not on file    Forced sexual activity: Not on file  Other Topics Concern  . Not on file  Social History Narrative  . Not on file    FAMILY HISTORY:  Family History  Problem Relation Age of Onset  . Hypertension Mother   . Heart attack Mother   . Diabetes Mother   . Skin cancer Mother   . Hypertension Father   . Heart attack Father   . Skin cancer Father   . Heart disease Maternal Grandmother   . Stroke Maternal Grandfather   . Hypertension Sister   . Skin cancer Sister   . Hypertension Brother   . Skin cancer Brother   . Hypertension Brother   . Cancer Brother        melanoma  . Skin cancer Brother   . Factor V Leiden deficiency Daughter     CURRENT MEDICATIONS:  Outpatient Encounter Medications as of 03/21/2019  Medication Sig  . Calcium Carbonate-Vitamin D (CALCIUM + D PO) Take 1 tablet by mouth daily.  . Cholecalciferol (VITAMIN D) 2000 UNITS tablet Take 2,000 Units by mouth daily.  Marland Kitchen ibuprofen (ADVIL,MOTRIN) 200 MG tablet Take 200 mg by mouth as needed.  Marland Kitchen levothyroxine (SYNTHROID, LEVOTHROID) 75 MCG tablet Take 1 tablet (75 mcg total) by mouth daily.  Marland Kitchen oxyCODONE-acetaminophen (PERCOCET) 10-325 MG tablet Take 1 tablet by mouth every 8 (eight) hours as needed for pain.  Marland Kitchen triamcinolone cream (KENALOG) 0.1 % Apply BID to affected areas (Patient taking differently: Apply 1 application topically as needed. Apply BID to affected areas)   Facility-Administered Encounter Medications as of 03/21/2019  Medication  . 0.9 %  sodium chloride infusion  . 0.9 %  sodium chloride infusion  . sodium chloride 0.9 % injection 10 mL    ALLERGIES:  Allergies  Allergen Reactions  . Hydromorphone Hcl Nausea And Vomiting  . Poison Ivy Extract [Poison Ivy Extract]   . Tape Rash     PHYSICAL EXAM:  ECOG Performance status: 1  Vitals:   03/21/19 0809  BP: 132/75  Pulse: (!) 120  Resp: 16  Temp: (!) 97.4  F (36.3 C)  SpO2: 100%   Filed Weights   03/21/19 0809  Weight: 139 lb (63 kg)    Physical Exam Constitutional:      Appearance: She is well-developed.  Neck:     Musculoskeletal: Normal range of motion and neck supple.  Cardiovascular:     Rate and Rhythm: Normal rate and regular rhythm.     Heart sounds: Normal heart sounds.  Pulmonary:     Effort: Pulmonary effort is normal.  Breath sounds: Normal breath sounds.  Abdominal:     General: There is no distension.     Palpations: Abdomen is soft. There is no mass.  Musculoskeletal: Normal range of motion.  Skin:    General: Skin is warm.  Neurological:     General: No focal deficit present.     Mental Status: She is alert and oriented to person, place, and time.  Psychiatric:        Mood and Affect: Mood normal.        Behavior: Behavior normal.     LABORATORY DATA:  I have reviewed the labs as listed.  CBC    Component Value Date/Time   WBC 4.1 03/14/2019 0831   RBC 3.60 (L) 03/14/2019 0831   HGB 11.9 (L) 03/14/2019 0831   HCT 36.5 03/14/2019 0831   PLT 158 03/14/2019 0831   MCV 101.4 (H) 03/14/2019 0831   MCH 33.1 03/14/2019 0831   MCHC 32.6 03/14/2019 0831   RDW 12.6 03/14/2019 0831   LYMPHSABS 1.2 03/14/2019 0831   MONOABS 0.6 03/14/2019 0831   EOSABS 0.1 03/14/2019 0831   BASOSABS 0.0 03/14/2019 0831   CMP Latest Ref Rng & Units 02/08/2019 10/31/2018 08/01/2018  Glucose 70 - 99 mg/dL 100(H) 113(H) 80  BUN 8 - 23 mg/dL 18 19 20   Creatinine 0.44 - 1.00 mg/dL 0.87 0.80 0.87  Sodium 135 - 145 mmol/L 140 139 140  Potassium 3.5 - 5.1 mmol/L 4.2 4.1 4.0  Chloride 98 - 111 mmol/L 103 100 104  CO2 22 - 32 mmol/L 28 28 29   Calcium 8.9 - 10.3 mg/dL 9.6 9.3 9.4  Total Protein 6.5 - 8.1 g/dL 7.7 7.7 7.3  Total Bilirubin 0.3 - 1.2 mg/dL 0.5 0.7 0.5  Alkaline Phos 38 - 126 U/L 57 53 60  AST 15 - 41 U/L 34 20 21  ALT 0 - 44 U/L 28 17 19     I have personally reviewed her scans.  ASSESSMENT & PLAN:    Multiple myeloma 1.  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 -Revlimid maintenance 5 mg 1 week on 1 week off, discontinued in October 2014 secondary to intolerance from low blood counts. -Zometa discontinued in August 2014. -Last bone marrow biopsy on 04/07/2017 [facility] negative for any residual myeloma. -Most recent surveillance labs on 02/08/2019 showed resurgence of M spike 0.4 g/dl and elevated free light chain ratio 5.5 with elevated kappa light chains of 94. -Her labs at [facility] on 03/13/2019 showed M spike of 1.9 g/dL and light chain ratio of 12.77 with kappa light chains of 151. -Bone marrow biopsy on 03/14/2019 showed 50% plasma cells.  Cytogenetics and FISH are pending. -PET CT scan on 03/10/2019 showed extensive bone involvement in the spine, sternum, bilateral ribs, clavicles, shoulders, pelvic girdle. -She also met with Dr. Norma Fredrickson who is considering her for clinical trial "second chance study".  We will know by the end of today whether they have the trial open or not.  I have reached out to Dr. Norma Fredrickson today and talked to him. -Based on her myeloma FISH panel, we will come up with the treatment plan to start as early as next week, if the clinical trial is not available.  2.  Left shoulder pain: -She reported pain in her left shoulder which started only 4 days ago and rates it as 9 out of 10. -She is taking hydrocodone 5  mg at bedtime for sleep through the pain. -We will send a prescription for Percocet 10 mg / 325 mg to be taken every 8 hours as needed. -She has hypermetabolic lesion in the left distal clavicle and scapula.  I will seek consultation with radiation oncology for pain control. -I have reached out to Dr. [name] who kindly accepted to see this patient today.  3.  New onset numbness: -She reported numbness in the chin area which started  few days ago. -She also noticed numbness in the left forearm.  She also noted slight weakness in the left hand. -Left hand muscle strength is decreased.  I have recommended MRI of the cervical and thoracic spine to evaluate for any extradural masses.     Orders placed this encounter:  Orders Placed This Encounter  Procedures  . MR Cervical Spine W Wo Contrast  . MR THORACIC [place], [place] [phone]

## 2019-03-21 NOTE — Progress Notes (Signed)
Histology and Location of Primary Cancer: Relapsed IgA kappa multiple myeloma  Sites of Visceral and Bony Metastatic Disease: extensive bone involvement in the spine, sternum, bilateral ribs, clavicles, shoulders, pelvic girdle.  Location(s) of Symptomatic Metastases: Left shoulder  Past/Anticipated chemotherapy by medical oncology, if any:  -Initially treated with Velcade and dexamethasone followed by stem cell transplant on 03/06/2009. -Revlimid maintenance 5 mg 1 week on 1 week off, discontinued in October 2014 secondary to intolerance from low blood counts. -Zometa discontinued in August 2014. -Last bone marrow biopsy on 04/07/2017 at [facility] negative for any residual myeloma. -Most recent surveillance labs on 02/08/2019 showed resurgence of M spike 0.4 g/dl and elevated free light chain ratio 5.5 with elevated kappa light chains of 94. -Her labs at [facility] on 03/13/2019 showed M spike of 1.9 g/dL and light chain ratio of 12.77 with kappa light chains of 151. -Bone marrow biopsy on 03/14/2019 showed 50% plasma cells.  Cytogenetics and FISH are pending. -PET CT scan on 03/10/2019 showed extensive bone involvement in the spine, sternum, bilateral ribs, clavicles, shoulders, pelvic girdle. -She also met with Dr. Norma Fredrickson who is considering her for clinical trial "second chance study".  We will know by the end of today whether they have the trial open or not.  I have reached out to Dr. Norma Fredrickson today and talked to him. -Based on her myeloma FISH panel, we will come up with the treatment plan to start as early as next week, if the clinical trial is not available.   Pain on a scale of 0-10 is: Reports left shoulder pain 9 out of 10 that began four days ago. Taking hydrocodone 5 mg at bedtime. Prescribed Percocet 10/325 every 8 hours by Dr. Raliegh Ip.    If Spine Met(s), symptoms, if any, include:  Bowel/Bladder retention or incontinence (please describe): no  Numbness  or weakness in extremities (please describe): Reports numbness in chin that began a few days ago. Reports numbness in left forearm. Reports weakness in left hand.  Current Decadron regimen, if applicable: no  Ambulatory status? Walker? Wheelchair?: Ambulatory  SAFETY ISSUES:  Prior radiation? Received about 10 years ago. T6 and a few other vertebrea (patient unable to recall)  Pacemaker/ICD? No  Possible current pregnancy? No  Is the patient on methotrexate? 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 Patient presents today with her husband. Reports numbness to the left forearm that seems to be spreading, as well as chin and along jaw line. Occasionally has trouble doing fine motor skills with left hand. Reports new pain to front of left shin last night that went away by morning. Has right hip pain that radiates down front of thigh; occurs more when standing and walking. Shoulder pain is constant and pain sometimes radiates to the front breast bone. Denies any headaches, dizziness, blurred vision, or balance issues.

## 2019-03-21 NOTE — Progress Notes (Signed)
Radiation Oncology         [phone]) [phone] ________________________________  Initial outpatient Consultation  Name: Melissa [name] MRN: [id number]  Date: 03/22/2019  DOB: 25-Jan-1956  UR:KYHCWC, Elayne Snare, MD  Kathyrn Drown, MD   REFERRING PHYSICIAN: Kathyrn Drown, MD  DIAGNOSIS: 63 y.o. woman with painful bony lesions in the left shoulder, cervical spine and right hip secondary to recurrent multiple myeloma    ICD-10-CM   1. Multiple myeloma not having achieved remission ([place])  C90.00     HISTORY OF PRESENT ILLNESS: Melissa [name] is a 63 y.o. female with a diagnosis of recurrent multiple myeloma. She was initially diagnosed with multiple myeloma on 10/12/08, presenting with right-sided upper back pain. She received palliative radiation therapy here to T1 - T7 and was referred to [facility] on 11/12/2008 for consultation regarding possible role of autologous stem cell transplantation and the overall management of her disease. She was subsequently placed on Velcade and completed 4 cycles, to which she had an excellent response. She was found to be a good candidate for stem cell transplant, which took place on 03/06/2009. Following the transplant, her hospital course was complicated by significant mucositis with fevers and neutropenia. Repeat bone marrow biopsy on 05/08/2009 showed no evidence of disease.She continued her care locally under the care and direction of the [facility] medical oncology team, with maintenance Revlimid which was discontinued in 01/2013 secondary to intolerance from low blood counts. She continued under observation only, most recently with Dr. Delton Coombes as well as continuing with periodic follow up at [facility] with Dr. Tiana Loft. A repeat bone marrow biopsy on 04/07/2017 remained negative for any residual or recurrent myeloma so she continued in observation only.   Recent surveillance labs on 02/08/2019 showed resurgence of M spike and elevated free  light chain ratio with elevated kappa light chains, indicating disease recurrance. She underwent PET scan on 03/10/2019, which revealed extensive patchy osseous hypermetabolism throughout the axial and proximal appendicular skeleton involving the spine, sternum, bilateral ribs, clavicles, shoulders and pelvic girdle, compatible with widespread metabolically active multiple myeloma.  Repeat labs at [facility] on 03/13/2019 were further elevated so she proceeded to repeat bone marrow biopsy on 03/14/2019 with final pathology confirming recurrent myeloma.  She has had progressive left arm/shoulder pain and weakness over the past week, rating a 9/10 in severity and requiring Vicodin at night to help her rest. The pain does wake her from sleep at night. She also developed some numbness in the chin area and left arm associated with weakness in the left hand. This was further evaluated with an MRI of the cervical and thoracic spine on 03/21/2019, which showed diffusely abnormal marrow signal reflecting multiple myeloma with dorsal and left lateral epidural disease at C7-T1 and T1-T2 resulting in moderate stenosis without cord compression and no acute compression deformity.  Additionally noted was a chronic T6 compression deformity with mild osseous retropulsion.  She has been kindly referred to [place] for discussion of possible palliative radiation therapy to the painful sites of disease.  PREVIOUS RADIATION THERAPY: Yes 10/2008: Palliative radiotherapy to T1-T7 [name])  PAST MEDICAL HISTORY:  Past Medical History:  Diagnosis Date   History of stem cell transplant ([place])    for multiple myeloma   Hypothyroidism    Multiple myeloma    Peripheral neuropathy    Seasonal allergies    Thyroid disease       PAST SURGICAL HISTORY: Past Surgical History:  Procedure Laterality Date  CATARACT EXTRACTION W/PHACO Right 04/04/2013   Procedure: CATARACT EXTRACTION PHACO AND INTRAOCULAR LENS PLACEMENT (IOC);   Surgeon: Elta Guadeloupe T. Gershon Crane, MD;  Location: [facility];  Service: Ophthalmology;  Laterality: Right;  CDE:3.42   CATARACT EXTRACTION W/PHACO Left 04/18/2013   Procedure: CATARACT EXTRACTION PHACO AND INTRAOCULAR LENS PLACEMENT (IOC);  Surgeon: Elta Guadeloupe T. Gershon Crane, MD;  Location: [facility];  Service: Ophthalmology;  Laterality: Left;  CDE:4.91   CESAREAN SECTION  06/1993   FASCIECTOMY Left 03/02/2017   Procedure: FASCIECTOMY, LEFT SMALL FINGER;  Surgeon: Daryll Brod, MD;  Location: [facility];  Service: Orthopedics;  Laterality: Left;   HAND SURGERY Left    TONSILECTOMY, ADENOIDECTOMY, BILATERAL MYRINGOTOMY AND TUBES     TONSILLECTOMY     TUBAL LIGATION     YAG LASER APPLICATION Right 09/11/15   Procedure: YAG LASER APPLICATION;  Surgeon: Rutherford Guys, MD;  Location: [facility];  Service: Ophthalmology;  Laterality: Right;   YAG LASER APPLICATION Left 4/94/4967   Procedure: YAG LASER APPLICATION;  Surgeon: Rutherford Guys, MD;  Location: [facility];  Service: Ophthalmology;  Laterality: Left;    FAMILY HISTORY:  Family History  Problem Relation Age of Onset   Hypertension Mother    Heart attack Mother    Diabetes Mother    Skin cancer Mother    Hypertension Father    Heart attack Father    Skin cancer Father    Heart disease Maternal Grandmother    Stroke Maternal Grandfather    Hypertension Sister    Skin cancer Sister    Hypertension Brother    Skin cancer Brother    Hypertension Brother    Cancer Brother        melanoma   Skin cancer Brother    Factor V Leiden deficiency Daughter     SOCIAL HISTORY:  Social History   Socioeconomic History   Marital status: Married    Spouse name: Not on file   Number of children: Not on file   Years of education: Not on file   Highest education level: Not on file  Occupational History   Not on file  Social Needs   Financial resource strain: Not on file   Food insecurity    Worry: Not on file    Inability:  Not on file   Transportation needs    Medical: Not on file    Non-medical: Not on file  Tobacco Use   Smoking status: Never Smoker   Smokeless tobacco: Never Used  Substance and Sexual Activity   Alcohol use: Yes    Comment: occ   Drug use: No   Sexual activity: Not Currently    Birth control/protection: Post-menopausal  Lifestyle   Physical activity    Days per week: Not on file    Minutes per session: Not on file   Stress: Not on file  Relationships   Social connections    Talks on phone: Not on file    Gets together: Not on file    Attends religious service: Not on file    Active member of club or organization: Not on file    Attends meetings of clubs or organizations: Not on file    Relationship status: Not on file   Intimate partner violence    Fear of current or ex partner: Not on file    Emotionally abused: Not on file    Physically abused: Not on file    Forced sexual activity: Not on file  Other Topics Concern  Not on file  Social History Narrative   Not on file    ALLERGIES: Hydromorphone hcl, Poison ivy extract [poison ivy extract], and Tape  MEDICATIONS:  Current Outpatient Medications  Medication Sig Dispense Refill   Calcium Carbonate-Vitamin D (CALCIUM + D PO) Take 1 tablet by mouth daily.     Cholecalciferol (VITAMIN D) 2000 UNITS tablet Take 2,000 Units by mouth daily.     ibuprofen (ADVIL,MOTRIN) 200 MG tablet Take 200 mg by mouth as needed.     levothyroxine (SYNTHROID, LEVOTHROID) 75 MCG tablet Take 1 tablet (75 mcg total) by mouth daily. 90 tablet 3   oxyCODONE-acetaminophen (PERCOCET) 10-325 MG tablet Take 1 tablet by mouth every 8 (eight) hours as needed for pain. 30 tablet 0   triamcinolone cream (KENALOG) 0.1 % Apply BID to affected areas (Patient taking differently: Apply 1 application topically as needed. Apply BID to affected areas) 30 g 1   No current facility-administered medications for this encounter.     Facility-Administered Medications Ordered in Other Encounters  Medication Dose Route Frequency Provider Last Rate Last Dose   0.9 %  sodium chloride infusion   Intravenous Continuous Baird Cancer, PA-C 20 mL/hr at 12/05/10 0947     0.9 %  sodium chloride infusion   Intravenous Continuous Everardo All, MD 20 mL/hr at 02/26/11 1528     sodium chloride 0.9 % injection 10 mL  10 mL Intravenous PRN Everardo All, MD   10 mL at 02/26/11 1518    REVIEW OF SYSTEMS:  On review of systems, the patient reports that she is doing well overall. She denies any chest pain, shortness of breath, cough, fevers, chills, night sweats, or unintended weight changes. She denies any bowel or bladder disturbances, and denies abdominal pain, nausea or vomiting. She denies any new skin lesions or concerns. She continues with severe left shoulder and arm pain with associated paraesthesias and weakness in the left forearm/hand.  She also reports right hip pain in the groin, worse with ambulation and weight bearing but no radiation into the lower leg or paraesthesias.  A complete review of systems is obtained and is otherwise negative.  PHYSICAL EXAM:  Wt Readings from Last 3 Encounters:  03/22/19 136 lb 12.8 oz (62.1 kg)  03/21/19 139 lb (63 kg)  11/08/18 142 lb 14.4 oz (64.8 kg)   Temp Readings from Last 3 Encounters:  03/22/19 97.8 F (36.6 C)  03/21/19 (!) 97.4 F (36.3 C) (Temporal)  03/14/19 98.8 F (37.1 C) (Oral)   BP Readings from Last 3 Encounters:  03/22/19 (!) 111/51  03/21/19 132/75  03/14/19 122/67   Pulse Readings from Last 3 Encounters:  03/22/19 (!) 108  03/21/19 (!) 120  03/14/19 99    /10  In general this is a well appearing Caucasian female in no acute distress. She's alert and oriented x4 and appropriate throughout the examination. Cardiopulmonary assessment is negative for acute distress and she exhibits normal effort.   KPS = 80  100 - Normal; no complaints; no  evidence of disease. 90   - Able to carry on normal activity; minor signs or symptoms of disease. 80   - Normal activity with effort; some signs or symptoms of disease. 39   - Cares for self; unable to carry on normal activity or to do active work. 60   - Requires occasional assistance, but is able to care for most of his personal needs. 50   - Requires considerable assistance and frequent medical  care. 35   - Disabled; requires special care and assistance. 1   - Severely disabled; hospital admission is indicated although death not imminent. 80   - Very sick; hospital admission necessary; active supportive treatment necessary. 10   - Moribund; fatal processes progressing rapidly. 0     - Dead  Karnofsky DA, Abelmann [place], Craver LS and Burchenal [facility] [phone]) The use of the nitrogen mustards in the palliative treatment of carcinoma: with particular reference to bronchogenic carcinoma Cancer 1 [phone]  LABORATORY DATA:  Lab Results  Component Value Date   WBC 4.1 03/14/2019   HGB 11.9 (L) 03/14/2019   HCT 36.5 03/14/2019   MCV 101.4 (H) 03/14/2019   PLT 158 03/14/2019   Lab Results  Component Value Date   NA 140 02/08/2019   K 4.2 02/08/2019   CL 103 02/08/2019   CO2 28 02/08/2019   Lab Results  Component Value Date   ALT 28 02/08/2019   AST 34 02/08/2019   ALKPHOS 57 02/08/2019   BILITOT 0.5 02/08/2019     RADIOGRAPHY: Mr Cervical Spine W Wo Contrast  Result Date: 03/21/2019 CLINICAL DATA:  Upper back pain, left arm numbness; history of multiple myeloma EXAM: MRI CERVICAL AND THORACIC SPINE WITHOUT CONTRAST TECHNIQUE: Multiplanar and multiecho pulse sequences of the cervical spine, to include the craniocervical junction and cervicothoracic junction, and thoracic and lumbar spine, were obtained without intravenous contrast. COMPARISON:  2016 FINDINGS: MRI CERVICAL SPINE FINDINGS Alignment: Straightening of the cervical lordosis. Trace retrolisthesis at C3-C4. Vertebrae: Vertebral  body heights are maintained apart from mild degenerative endplate irregularity. There is diffusely abnormal T1 marrow signal and STIR hyperintensity reflecting myelomatous involvement. Left dorsal and lateral epidural soft tissue is present centered at the T1 level with some extension to the C7 level superiorly. There is involvement of the left C7-T1 neural foramen. Cord: Normal cord signal. Posterior Fossa, vertebral arteries, paraspinal tissues: Unremarkable. Disc levels: C2-C3: Very small central disc protrusion. No canal or foraminal stenosis. C3-C4: Disc bulge with endplate osteophytes and superimposed small central disc protrusion. Bilateral uncovertebral hypertrophy. Mild canal stenosis. No right foraminal stenosis. Increased moderate left foraminal stenosis. C4-C5: Disc bulge with superimposed small central disc protrusion. No canal or foraminal stenosis. C5-C6: Disc bulge with endplate osteophytes. Bilateral uncovertebral hypertrophy. Increased mild to moderate canal stenosis. Increased moderate right and mild left foraminal stenosis. C6-C7: Disc bulge with endplate osteophytes. Mild canal stenosis. No foraminal stenosis. C7-T1: Left epidural soft tissue as described above. Moderate canal stenosis. No right foraminal stenosis. Effacement of the left foramen. MRI THORACIC SPINE FINDINGS Alignment:  Exaggerated focal kyphosis at T6. Vertebrae: Diffusely abnormal marrow signal reflecting myelomatous infiltration. There is chronic severe compression deformity of T6 with mild osseous retropulsion. Dorsal and left lateral epidural soft tissue is present at T1 as described above with extension into the left T1-T2 neural foramen. Cord:  Normal cord signal. Paraspinal and other soft tissues: Unremarkable. Disc levels: At the T1 and T1-T2 levels, epidural soft tissue as described above causes moderate canal stenosis with rightward displacement of the cord but no cord compression. There is partial effacement of the  left T1-T2 neural foramen. Mild multilevel degenerative disc disease with small disc bulges and protrusions but no significant canal compromise. Mild multilevel facet hypertrophy is present without high-grade degenerative foraminal stenosis. IMPRESSION: Diffusely abnormal marrow signal reflecting multiple myeloma. No acute compression deformity. Dorsal and left lateral epidural disease at C7-T1 to T1-T2 resulting in moderate stenosis without cord compression. There is involvement  of the left C7-T1 greater than T1-T2 neural foramina. Chronic T6 compression deformity with mild osseous retropulsion. Electronically Signed   By: Macy Mis M.D.   On: 03/21/2019 15:58   Mr Thoracic Spine W Wo Contrast  Result Date: 03/21/2019 CLINICAL DATA:  Upper back pain, left arm numbness; history of multiple myeloma EXAM: MRI CERVICAL AND THORACIC SPINE WITHOUT CONTRAST TECHNIQUE: Multiplanar and multiecho pulse sequences of the cervical spine, to include the craniocervical junction and cervicothoracic junction, and thoracic and lumbar spine, were obtained without intravenous contrast. COMPARISON:  2016 FINDINGS: MRI CERVICAL SPINE FINDINGS Alignment: Straightening of the cervical lordosis. Trace retrolisthesis at C3-C4. Vertebrae: Vertebral body heights are maintained apart from mild degenerative endplate irregularity. There is diffusely abnormal T1 marrow signal and STIR hyperintensity reflecting myelomatous involvement. Left dorsal and lateral epidural soft tissue is present centered at the T1 level with some extension to the C7 level superiorly. There is involvement of the left C7-T1 neural foramen. Cord: Normal cord signal. Posterior Fossa, vertebral arteries, paraspinal tissues: Unremarkable. Disc levels: C2-C3: Very small central disc protrusion. No canal or foraminal stenosis. C3-C4: Disc bulge with endplate osteophytes and superimposed small central disc protrusion. Bilateral uncovertebral hypertrophy. Mild canal  stenosis. No right foraminal stenosis. Increased moderate left foraminal stenosis. C4-C5: Disc bulge with superimposed small central disc protrusion. No canal or foraminal stenosis. C5-C6: Disc bulge with endplate osteophytes. Bilateral uncovertebral hypertrophy. Increased mild to moderate canal stenosis. Increased moderate right and mild left foraminal stenosis. C6-C7: Disc bulge with endplate osteophytes. Mild canal stenosis. No foraminal stenosis. C7-T1: Left epidural soft tissue as described above. Moderate canal stenosis. No right foraminal stenosis. Effacement of the left foramen. MRI THORACIC SPINE FINDINGS Alignment:  Exaggerated focal kyphosis at T6. Vertebrae: Diffusely abnormal marrow signal reflecting myelomatous infiltration. There is chronic severe compression deformity of T6 with mild osseous retropulsion. Dorsal and left lateral epidural soft tissue is present at T1 as described above with extension into the left T1-T2 neural foramen. Cord:  Normal cord signal. Paraspinal and other soft tissues: Unremarkable. Disc levels: At the T1 and T1-T2 levels, epidural soft tissue as described above causes moderate canal stenosis with rightward displacement of the cord but no cord compression. There is partial effacement of the left T1-T2 neural foramen. Mild multilevel degenerative disc disease with small disc bulges and protrusions but no significant canal compromise. Mild multilevel facet hypertrophy is present without high-grade degenerative foraminal stenosis. IMPRESSION: Diffusely abnormal marrow signal reflecting multiple myeloma. No acute compression deformity. Dorsal and left lateral epidural disease at C7-T1 to T1-T2 resulting in moderate stenosis without cord compression. There is involvement of the left C7-T1 greater than T1-T2 neural foramina. Chronic T6 compression deformity with mild osseous retropulsion. Electronically Signed   By: Macy Mis M.D.   On: 03/21/2019 15:58   Nm Pet Image  Initial (pi) Whole Body  Result Date: 03/10/2019 CLINICAL DATA:  Initial treatment strategy for multiple myeloma having not achieved remission. EXAM: NUCLEAR MEDICINE PET WHOLE BODY TECHNIQUE: 6.8 mCi F-18 FDG was injected intravenously. Full-ring PET imaging was performed from the skull base to thigh after the radiotracer. CT data was obtained and used for attenuation correction and anatomic localization. Fasting blood glucose: 94 mg/dl COMPARISON:  10/09/2008 bone survey. FINDINGS: Mediastinal blood pool activity: SUV max 3.0 HEAD/NECK: No hypermetabolic activity in the scalp. No hypermetabolic cervical lymph nodes. Incidental CT findings: none CHEST: No enlarged or hypermetabolic axillary, mediastinal or hilar lymph nodes. No hypermetabolic pulmonary findings. Incidental CT findings: No acute  consolidative airspace disease or lung masses. Peripheral left upper lobe 3 mm pulmonary nodule (series 8/image 16), below PET resolution. No additional significant pulmonary nodules. Mosaic attenuation throughout both lungs, most commonly due to air trapping. Coronary atherosclerosis. Atherosclerotic nonaneurysmal thoracic aorta. ABDOMEN/PELVIS: No abnormal hypermetabolic activity within the liver, pancreas, adrenal glands, or spleen. No hypermetabolic lymph nodes in the abdomen or pelvis. Mild diffuse splenic hypermetabolism (spleen max SUV 4.5 compared to liver max SUV 3.7). No splenic masses. Normal size spleen. Incidental CT findings: Cholelithiasis. Mild diffuse hepatic steatosis. Atherosclerotic nonaneurysmal abdominal aorta. Minimal sigmoid diverticulosis. SKELETON: There is extensive patchy osseous hypermetabolism throughout spine, sternum, bilateral ribs, clavicles, shoulder girdles and pelvic girdle bilaterally without discrete lesions on the CT images. Representative osseous lesions as follows: -left manubrial lesion with max SUV 9.0 -proximal right humeral lesion with max SUV 17.3 -anterior right second rib  lesion with max SUV 6.0 -medial right iliac bone lesion with max SUV 16.8 -left femoral neck lesion with max SUV 19.2 -posterior element T1 lesion with max SUV 11.3 Incidental CT findings: Chronic T6 and L3 vertebral compression fractures. EXTREMITIES: There is patchy hypermetabolism in the proximal and diaphyseal femora bilaterally, without discrete lesions on the CT images. Representative left femoral neck lesion with max SUV 19.2. Incidental CT findings: none IMPRESSION: 1. Extensive patchy osseous hypermetabolism throughout the axial and proximal appendicular skeleton, compatible with widespread metabolically active multiple myeloma. 2. Mildly hypermetabolic spleen, which is normal size. 3. Chronic findings include: Aortic Atherosclerosis (ICD10-I70.0). Coronary atherosclerosis. Cholelithiasis. Mild diffuse hepatic steatosis. Electronically Signed   By: Ilona Sorrel M.D.   On: 03/10/2019 12:34   Ct Biopsy  Result Date: 03/14/2019 INDICATION: Multiple myeloma. Please perform CT-guided bone marrow biopsy for tissue diagnostic purposes. EXAM: CT-GUIDED BONE MARROW BIOPSY AND ASPIRATION MEDICATIONS: None ANESTHESIA/SEDATION: Fentanyl 50 mcg IV; Versed 2 mg IV Sedation Time: 10 Minutes; The patient was continuously monitored during the procedure by the interventional radiology nurse under my direct supervision. COMPLICATIONS: None immediate. PROCEDURE: Informed consent was obtained from the patient following an explanation of the procedure, risks, benefits and alternatives. The patient understands, agrees and consents for the procedure. All questions were addressed. A time out was performed prior to the initiation of the procedure. The patient was positioned prone and non-contrast localization CT was performed of the pelvis to demonstrate the iliac marrow spaces. The operative site was prepped and draped in the usual sterile fashion. Under sterile conditions and local anesthesia, a 22 gauge spinal needle was  utilized for procedural planning. Next, an 11 gauge coaxial bone biopsy needle was advanced into the left iliac marrow space. Needle position was confirmed with CT imaging. Initially, bone marrow aspiration was performed. Next, a bone marrow biopsy was obtained with the 11 gauge outer bone marrow device. Samples were prepared with the cytotechnologist and deemed adequate. The needle was removed intact. Hemostasis was obtained with compression and a dressing was placed. The patient tolerated the procedure well without immediate post procedural complication. IMPRESSION: Successful CT guided left iliac bone marrow aspiration and core biopsy. Electronically Signed   By: Sandi Mariscal M.D.   On: 03/14/2019 10:34   Ct Bone Marrow Biopsy & Aspiration  Result Date: 03/14/2019 INDICATION: Multiple myeloma. Please perform CT-guided bone marrow biopsy for tissue diagnostic purposes. EXAM: CT-GUIDED BONE MARROW BIOPSY AND ASPIRATION MEDICATIONS: None ANESTHESIA/SEDATION: Fentanyl 50 mcg IV; Versed 2 mg IV Sedation Time: 10 Minutes; The patient was continuously monitored during the procedure by the interventional radiology nurse under my  direct supervision. COMPLICATIONS: None immediate. PROCEDURE: Informed consent was obtained from the patient following an explanation of the procedure, risks, benefits and alternatives. The patient understands, agrees and consents for the procedure. All questions were addressed. A time out was performed prior to the initiation of the procedure. The patient was positioned prone and non-contrast localization CT was performed of the pelvis to demonstrate the iliac marrow spaces. The operative site was prepped and draped in the usual sterile fashion. Under sterile conditions and local anesthesia, a 22 gauge spinal needle was utilized for procedural planning. Next, an 11 gauge coaxial bone biopsy needle was advanced into the left iliac marrow space. Needle position was confirmed with CT imaging.  Initially, bone marrow aspiration was performed. Next, a bone marrow biopsy was obtained with the 11 gauge outer bone marrow device. Samples were prepared with the cytotechnologist and deemed adequate. The needle was removed intact. Hemostasis was obtained with compression and a dressing was placed. The patient tolerated the procedure well without immediate post procedural complication. IMPRESSION: Successful CT guided left iliac bone marrow aspiration and core biopsy. Electronically Signed   By: Sandi Mariscal M.D.   On: 03/14/2019 10:34      IMPRESSION/PLAN: 1. 63 y.o. woman with painful bony lesions in the left shoulder, cervical spine and right hip secondary to recurrent multiple myeloma. Today, we talked to the patient and family about the findings and workup thus far. We discussed the natural history of recurrent multiple myeloma and general treatment, highlighting the role of palliative radiotherapy in the management of symptomatic lesions. We discussed the available radiation techniques, and focused on the details and logistics of delivery. The recommendation is to proceed with a 2 week course of daily radiotherapy to the left shoulder, C7 - T2 and right hip.  We reviewed the anticipated acute and late sequelae associated with radiation in this setting. The patient was encouraged to ask questions that were answered to her satisfaction. She appears to have a good understanding of her disease and our recommendations which are of palliative intent.  At the conclusion of our conversation the patient is interested in moving forward with a 2 week course of daily radiotherapy to the left shoulder, C7 - T2 and right hip.  She has freely signed written consent to proceed today in the office and a copy of this document has been placed in her medical record.  She will have CT SIM today, following our visit, in anticipation of beginning her course of radiation on Monday, 03/27/19. We will share this discussion with  Dr. Delton Coombes and move forward with treatment planning accordingly.    Nicholos Johns, PA-C    Tyler Pita, MD  [facility] Direct Dial: [phone]   Fax: [phone] [email or web address]   Skype   [email or web address]   This document serves as a record of services personally performed by Tyler Pita, MD and Freeman Caldron, PA-C. It was created on their behalf by Wilburn Mylar, a trained medical scribe. The creation of this record is based on the scribe's personal observations and the provider's statements to them. This document has been checked and approved by the attending provider.

## 2019-03-21 NOTE — Assessment & Plan Note (Addendum)
1.  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 -Revlimid maintenance 5 mg 1 week on 1 week off, discontinued in October 2014 secondary to intolerance from low blood counts. -Zometa discontinued in August 2014. -Last bone marrow biopsy on 04/07/2017 at [facility] negative for any residual myeloma. -Most recent surveillance labs on 02/08/2019 showed resurgence of M spike 0.4 g/dl and elevated free light chain ratio 5.5 with elevated kappa light chains of 94. -Her labs at [facility] on 03/13/2019 showed M spike of 1.9 g/dL and light chain ratio of 12.77 with kappa light chains of 151. -Bone marrow biopsy on 03/14/2019 showed 50% plasma cells.  Cytogenetics and FISH are pending. -PET CT scan on 03/10/2019 showed extensive bone involvement in the spine, sternum, bilateral ribs, clavicles, shoulders, pelvic girdle. -She also met with Dr. Norma Fredrickson who is considering her for clinical trial "second chance study".  We will know by the end of today whether they have the trial open or not.  I have reached out to Dr. Norma Fredrickson today and talked to him. -Based on her myeloma FISH panel, we will come up with the treatment plan to start as early as next week, if the clinical trial is not available.  2.  Left shoulder pain: -She reported pain in her left shoulder which started only 4 days ago and rates it as 9 out of 10. -She is taking hydrocodone 5 mg at bedtime for sleep through the pain. -We will send a prescription for Percocet 10 mg / 325 mg to be taken every 8 hours as needed. -She has hypermetabolic lesion in the left distal clavicle and scapula.  I will seek consultation with radiation oncology for pain control. -I have reached out to Dr. [name] who kindly accepted to see this patient today.  3.  New onset numbness: -She reported numbness in the chin area which started few days ago. -She  also noticed numbness in the left forearm.  She also noted slight weakness in the left hand. -Left hand muscle strength is decreased.  I have recommended MRI of the cervical and thoracic spine to evaluate for any extradural masses.

## 2019-03-21 NOTE — Patient Instructions (Addendum)
[place] at [facility] Discharge Instructions  You were seen today by Dr. Delton Coombes. He went over your recent lab and scan results. He will refer you to [facility] for radiation. He will schedule you for a MRI of your spine. He will see you back next week for labs, treatment and follow up.   Thank you for choosing [place] at [facility] to provide your oncology and hematology care.  To afford each patient quality time with our provider, please arrive at least 15 minutes before your scheduled appointment time.   If you have a lab appointment with the [facility] please come in thru the  Main Entrance and check in at the main information desk  You need to re-schedule your appointment should you arrive 10 or more minutes late.  We strive to give you quality time with our providers, and arriving late affects you and other patients whose appointments are after yours.  Also, if you no show three or more times for appointments you may be dismissed from the clinic at the providers discretion.     Again, thank you for choosing [facility].  Our hope is that these requests will decrease the amount of time that you wait before being seen by our physicians.       _____________________________________________________________  Should you have questions after your visit to [facility], please contact our office at [phone] between the hours of 8:00 a.m. and 4:30 p.m.  Voicemails left after 4:00 p.m. will not be returned until the following business day.  For prescription refill requests, have your pharmacy contact our office and allow 72 hours.    Cancer Center Support Programs:   > Cancer Support Group  2nd Tuesday of the month 1pm-2pm, Journey Room

## 2019-03-22 ENCOUNTER — Ambulatory Visit
Admission: RE | Admit: 2019-03-22 | Discharge: 2019-03-22 | Disposition: A | Discharge status: Home / Self Care | Payer: 59 | Source: Ambulatory Visit | Attending: Radiation Oncology | Admitting: Radiation Oncology

## 2019-03-22 ENCOUNTER — Ambulatory Visit: Payer: 59 | Admitting: Radiation Oncology

## 2019-03-22 ENCOUNTER — Encounter: Payer: Self-pay | Admitting: Radiation Oncology

## 2019-03-22 ENCOUNTER — Other Ambulatory Visit: Payer: Self-pay

## 2019-03-22 VITALS — BP 111/51 | HR 108 | Temp 97.8°F | Resp 18 | Wt 136.8 lb

## 2019-03-22 DIAGNOSIS — C9000 Multiple myeloma not having achieved remission: Secondary | ICD-10-CM

## 2019-03-22 DIAGNOSIS — Z79891 Long term (current) use of opiate analgesic: Secondary | ICD-10-CM | POA: Diagnosis not present

## 2019-03-22 DIAGNOSIS — Z923 Personal history of irradiation: Secondary | ICD-10-CM | POA: Diagnosis not present

## 2019-03-22 DIAGNOSIS — Z510 Encounter for antineoplastic radiation therapy: Secondary | ICD-10-CM | POA: Diagnosis not present

## 2019-03-22 DIAGNOSIS — G893 Neoplasm related pain (acute) (chronic): Secondary | ICD-10-CM | POA: Diagnosis not present

## 2019-03-22 NOTE — Progress Notes (Signed)
  Radiation Oncology         [phone] ________________________________  Name: Melissa [name] MRN: [id number]  Date: 03/22/2019  DOB: 1955-08-18  SIMULATION AND TREATMENT PLANNING NOTE    ICD-10-CM   1. Multiple myeloma not having achieved remission ([place])  C90.00     DIAGNOSIS:  63 yo woman with myeloma and painful involvement of the left shoulder and right hip  NARRATIVE:  The patient was brought to the [facility].  Identity was confirmed.  All relevant records and images related to the planned course of therapy were reviewed.  The patient freely provided informed written consent to proceed with treatment after reviewing the details related to the planned course of therapy. The consent form was witnessed and verified by the simulation staff.  Then, the patient was set-up in a stable reproducible  supine and arms akimbo position for radiation therapy.  CT images were obtained.  Surface markings were placed.  The CT images were loaded into the planning software.  Then the target and avoidance structures were contoured.  Treatment planning then occurred.  The radiation prescription was entered and confirmed.  Then, I designed and supervised the construction of a total of 5 medically necessary complex treatment devices.  I have requested : 3D Simulation  I have requested a DVH of the following structures: left lung, right lung, spinal cord, larynx, brachial plexus and targets.  SPECIAL TREATMENT PROCEDURE:  The planned course of therapy using radiation constitutes a special treatment procedure. Special care is required in the management of this patient for the following reasons. This treatment constitutes a Special Treatment Procedure for the following reason: [ Retreatment in a previously radiated area in the upper T-spine requiring careful monitoring of increased risk of toxicity due to overlap of previous treatment..  The special nature of the planned course of radiotherapy will  require increased physician supervision and oversight to ensure patient's safety with optimal treatment outcomes.  PLAN:  The patient will receive 20 Gy in 10 fraction.  ________________________________  Sheral Apley [name], M.D.

## 2019-03-27 ENCOUNTER — Other Ambulatory Visit: Payer: Self-pay

## 2019-03-27 ENCOUNTER — Encounter (HOSPITAL_COMMUNITY): Payer: Self-pay | Admitting: Hematology

## 2019-03-27 ENCOUNTER — Ambulatory Visit
Admission: RE | Admit: 2019-03-27 | Discharge: 2019-03-27 | Disposition: A | Discharge status: Home / Self Care | Payer: 59 | Source: Ambulatory Visit | Attending: Radiation Oncology | Admitting: Radiation Oncology

## 2019-03-27 DIAGNOSIS — C9000 Multiple myeloma not having achieved remission: Secondary | ICD-10-CM | POA: Diagnosis not present

## 2019-03-27 DIAGNOSIS — Z510 Encounter for antineoplastic radiation therapy: Secondary | ICD-10-CM | POA: Insufficient documentation

## 2019-03-28 ENCOUNTER — Encounter (HOSPITAL_COMMUNITY): Payer: Self-pay | Admitting: *Deleted

## 2019-03-28 ENCOUNTER — Other Ambulatory Visit: Payer: Self-pay

## 2019-03-28 ENCOUNTER — Ambulatory Visit (HOSPITAL_COMMUNITY): Payer: 59 | Admitting: Hematology

## 2019-03-28 ENCOUNTER — Ambulatory Visit
Admission: RE | Admit: 2019-03-28 | Discharge: 2019-03-28 | Disposition: A | Discharge status: Home / Self Care | Payer: 59 | Source: Ambulatory Visit | Attending: Radiation Oncology | Admitting: Radiation Oncology

## 2019-03-28 DIAGNOSIS — R197 Diarrhea, unspecified: Secondary | ICD-10-CM | POA: Diagnosis not present

## 2019-03-28 DIAGNOSIS — C9000 Multiple myeloma not having achieved remission: Secondary | ICD-10-CM | POA: Diagnosis not present

## 2019-03-28 DIAGNOSIS — C9001 Multiple myeloma in remission: Secondary | ICD-10-CM | POA: Diagnosis not present

## 2019-03-28 DIAGNOSIS — Z5112 Encounter for antineoplastic immunotherapy: Secondary | ICD-10-CM | POA: Diagnosis not present

## 2019-03-29 ENCOUNTER — Inpatient Hospital Stay (HOSPITAL_COMMUNITY): Payer: 59

## 2019-03-29 ENCOUNTER — Encounter (HOSPITAL_COMMUNITY): Payer: Self-pay | Admitting: Hematology

## 2019-03-29 ENCOUNTER — Inpatient Hospital Stay (HOSPITAL_COMMUNITY): Payer: 59 | Attending: Hematology | Admitting: Hematology

## 2019-03-29 ENCOUNTER — Ambulatory Visit
Admission: RE | Admit: 2019-03-29 | Discharge: 2019-03-29 | Disposition: A | Discharge status: Home / Self Care | Payer: 59 | Source: Ambulatory Visit | Attending: Radiation Oncology | Admitting: Radiation Oncology

## 2019-03-29 ENCOUNTER — Other Ambulatory Visit: Payer: Self-pay

## 2019-03-29 VITALS — BP 115/31 | HR 124 | Temp 97.9°F | Resp 18 | Wt 134.1 lb

## 2019-03-29 DIAGNOSIS — C9001 Multiple myeloma in remission: Secondary | ICD-10-CM | POA: Diagnosis not present

## 2019-03-29 DIAGNOSIS — C9000 Multiple myeloma not having achieved remission: Secondary | ICD-10-CM | POA: Diagnosis not present

## 2019-03-29 DIAGNOSIS — Z5112 Encounter for antineoplastic immunotherapy: Secondary | ICD-10-CM | POA: Insufficient documentation

## 2019-03-29 DIAGNOSIS — R197 Diarrhea, unspecified: Secondary | ICD-10-CM | POA: Insufficient documentation

## 2019-03-29 DIAGNOSIS — C9002 Multiple myeloma in relapse: Secondary | ICD-10-CM

## 2019-03-29 LAB — CBC WITH DIFFERENTIAL/PLATELET
Abs Immature Granulocytes: 0.16 10*3/uL — ABNORMAL HIGH (ref 0.00–0.07)
Basophils Absolute: 0 10*3/uL (ref 0.0–0.1)
Basophils Relative: 1 %
Eosinophils Absolute: 0 10*3/uL (ref 0.0–0.5)
Eosinophils Relative: 1 %
HCT: 33.3 % — ABNORMAL LOW (ref 36.0–46.0)
Hemoglobin: 10.8 g/dL — ABNORMAL LOW (ref 12.0–15.0)
Immature Granulocytes: 5 %
Lymphocytes Relative: 20 %
Lymphs Abs: 0.7 10*3/uL (ref 0.7–4.0)
MCH: 32.5 pg (ref 26.0–34.0)
MCHC: 32.4 g/dL (ref 30.0–36.0)
MCV: 100.3 fL — ABNORMAL HIGH (ref 80.0–100.0)
Monocytes Absolute: 0.4 10*3/uL (ref 0.1–1.0)
Monocytes Relative: 13 %
Neutro Abs: 2.2 10*3/uL (ref 1.7–7.7)
Neutrophils Relative %: 60 %
Platelets: 130 10*3/uL — ABNORMAL LOW (ref 150–400)
RBC: 3.32 MIL/uL — ABNORMAL LOW (ref 3.87–5.11)
RDW: 13.2 % (ref 11.5–15.5)
WBC: 3.5 10*3/uL — ABNORMAL LOW (ref 4.0–10.5)
nRBC: 0 % (ref 0.0–0.2)

## 2019-03-29 LAB — COMPREHENSIVE METABOLIC PANEL
ALT: 45 U/L — ABNORMAL HIGH (ref 0–44)
AST: 53 U/L — ABNORMAL HIGH (ref 15–41)
Albumin: 3.3 g/dL — ABNORMAL LOW (ref 3.5–5.0)
Alkaline Phosphatase: 70 U/L (ref 38–126)
Anion gap: 15 (ref 5–15)
BUN: 27 mg/dL — ABNORMAL HIGH (ref 8–23)
CO2: 25 mmol/L (ref 22–32)
Calcium: 10.5 mg/dL — ABNORMAL HIGH (ref 8.9–10.3)
Chloride: 95 mmol/L — ABNORMAL LOW (ref 98–111)
Creatinine, Ser: 1.27 mg/dL — ABNORMAL HIGH (ref 0.44–1.00)
GFR calc Af Amer: 53 mL/min — ABNORMAL LOW (ref >60)
GFR calc non Af Amer: 46 mL/min — ABNORMAL LOW (ref >60)
Glucose, Bld: 125 mg/dL — ABNORMAL HIGH (ref 70–99)
Potassium: 3.7 mmol/L (ref 3.5–5.1)
Sodium: 135 mmol/L (ref 135–145)
Total Bilirubin: 0.7 mg/dL (ref 0.3–1.2)
Total Protein: 9.3 g/dL — ABNORMAL HIGH (ref 6.5–8.1)

## 2019-03-29 LAB — LACTATE DEHYDROGENASE: LDH: 719 U/L — ABNORMAL HIGH (ref 98–192)

## 2019-03-29 MED ORDER — SODIUM CHLORIDE 0.9 % IV SOLN
INTRAVENOUS | Status: DC
  Administered 2019-03-29: 10:00:00 via INTRAVENOUS

## 2019-03-29 MED ORDER — ZOLEDRONIC ACID 4 MG/5ML IV CONC
3.0000 mg | Freq: Once | INTRAVENOUS | Status: DC
  Administered 2019-03-29: 3 mg via INTRAVENOUS
  Filled 2019-03-29: qty 3.75

## 2019-03-29 NOTE — Patient Instructions (Signed)
[place] at [facility] Discharge Instructions  You were seen today by Dr. Delton Coombes. He went over your recent lab results. He will give you fluids and Zometa today. He will recheck your labs tomorrow. He will see you back in  for labs and follow up.   Thank you for choosing [place] at [facility] to provide your oncology and hematology care.  To afford each patient quality time with our provider, please arrive at least 15 minutes before your scheduled appointment time.   If you have a lab appointment with the [facility] please come in thru the  Main Entrance and check in at the main information desk  You need to re-schedule your appointment should you arrive 10 or more minutes late.  We strive to give you quality time with our providers, and arriving late affects you and other patients whose appointments are after yours.  Also, if you no show three or more times for appointments you may be dismissed from the clinic at the providers discretion.     Again, thank you for choosing [facility].  Our hope is that these requests will decrease the amount of time that you wait before being seen by our physicians.       _____________________________________________________________  Should you have questions after your visit to [facility], please contact our office at [phone] between the hours of 8:00 a.m. and 4:30 p.m.  Voicemails left after 4:00 p.m. will not be returned until the following business day.  For prescription refill requests, have your pharmacy contact our office and allow 72 hours.    Cancer Center Support Programs:   > Cancer Support Group  2nd Tuesday of the month 1pm-2pm, Journey Room

## 2019-03-29 NOTE — Progress Notes (Signed)
Melissa [name], [place] [address]   CLINIC:  Medical Oncology/Hematology  PCP:  Melissa Drown, MD [address] [place] [address] [phone]   REASON FOR VISIT: Follow-up for multiple myeloma  CURRENT THERAPY: Under work-up for active therapy.   INTERVAL HISTORY:  Melissa [name] 63 y.o. female seen for follow-up of recurrent multiple myeloma.  She reportedly started radiation therapy on Monday.  She is feeling weak.  Energy and appetite levels are 50%.  Pain has slightly improved.  She reports some palpitations.  Numbness in the toes has been reported.  Mild fatigue is also stable.  REVIEW OF SYSTEMS:  Review of Systems  Musculoskeletal:       Left shoulder pain reported.  Neurological: Positive for numbness (In the chin and left forearm.).  All other systems reviewed and are negative.    PAST MEDICAL/SURGICAL HISTORY:  Past Medical History:  Diagnosis Date  . History of stem cell transplant ([place])    for multiple myeloma  . Hypothyroidism   . Multiple myeloma   . Peripheral neuropathy   . Seasonal allergies   . Thyroid disease    Past Surgical History:  Procedure Laterality Date  . CATARACT EXTRACTION W/PHACO Right 04/04/2013   Procedure: CATARACT EXTRACTION PHACO AND INTRAOCULAR LENS PLACEMENT (IOC);  Surgeon: Elta Guadeloupe T. Gershon Crane, MD;  Location: [facility];  Service: Ophthalmology;  Laterality: Right;  CDE:3.42  . CATARACT EXTRACTION W/PHACO Left 04/18/2013   Procedure: CATARACT EXTRACTION PHACO AND INTRAOCULAR LENS PLACEMENT (IOC);  Surgeon: Elta Guadeloupe T. Gershon Crane, MD;  Location: [facility];  Service: Ophthalmology;  Laterality: Left;  CDE:4.91  . CESAREAN SECTION  06/1993  . FASCIECTOMY Left 03/02/2017   Procedure: FASCIECTOMY, LEFT SMALL FINGER;  Surgeon: Daryll Brod, MD;  Location: [facility];  Service: Orthopedics;  Laterality: Left;  . HAND SURGERY Left   . TONSILECTOMY, ADENOIDECTOMY, BILATERAL MYRINGOTOMY AND TUBES     . TONSILLECTOMY    . TUBAL LIGATION    . YAG LASER APPLICATION Right 9/37/1696   Procedure: YAG LASER APPLICATION;  Surgeon: Rutherford Guys, MD;  Location: [facility];  Service: Ophthalmology;  Laterality: Right;  . YAG LASER APPLICATION Left 7/89/3810   Procedure: YAG LASER APPLICATION;  Surgeon: Rutherford Guys, MD;  Location: [facility];  Service: Ophthalmology;  Laterality: Left;     SOCIAL HISTORY:  Social History   Socioeconomic History  . Marital status: Married    Spouse name: Not on file  . Number of children: Not on file  . Years of education: Not on file  . Highest education level: Not on file  Occupational History  . Not on file  Social Needs  . Financial resource strain: Not on file  . Food insecurity    Worry: Not on file    Inability: Not on file  . Transportation needs    Medical: Not on file    Non-medical: Not on file  Tobacco Use  . Smoking status: Never Smoker  . Smokeless tobacco: Never Used  Substance and Sexual Activity  . Alcohol use: Yes    Comment: occ  . Drug use: No  . Sexual activity: Not Currently    Birth control/protection: Post-menopausal  Lifestyle  . Physical activity    Days per week: Not on file    Minutes per session: Not on file  . Stress: Not on file  Relationships  . Social connections    Talks on phone: Not on file  Gets together: Not on file    Attends religious service: Not on file    Active member of club or organization: Not on file    Attends meetings of clubs or organizations: Not on file    Relationship status: Not on file  . Intimate partner violence    Fear of current or ex partner: Not on file    Emotionally abused: Not on file    Physically abused: Not on file    Forced sexual activity: Not on file  Other Topics Concern  . Not on file  Social History Narrative  . Not on file    FAMILY HISTORY:  Family History  Problem Relation Age of Onset  . Hypertension Mother   . Heart attack Mother   . Diabetes Mother    . Skin cancer Mother   . Hypertension Father   . Heart attack Father   . Skin cancer Father   . Heart disease Maternal Grandmother   . Stroke Maternal Grandfather   . Hypertension Sister   . Skin cancer Sister   . Hypertension Brother   . Skin cancer Brother   . Hypertension Brother   . Cancer Brother        melanoma  . Skin cancer Brother   . Factor V Leiden deficiency Daughter     CURRENT MEDICATIONS:  Outpatient Encounter Medications as of 03/29/2019  Medication Sig  . Calcium Carbonate-Vitamin D (CALCIUM + D PO) Take 1 tablet by mouth daily.  . Cholecalciferol (VITAMIN D) 2000 UNITS tablet Take 2,000 Units by mouth daily.  Marland Kitchen levothyroxine (SYNTHROID, LEVOTHROID) 75 MCG tablet Take 1 tablet (75 mcg total) by mouth daily.  Marland Kitchen ibuprofen (ADVIL,MOTRIN) 200 MG tablet Take 200 mg by mouth as needed.  Marland Kitchen oxyCODONE-acetaminophen (PERCOCET) 10-325 MG tablet Take 1 tablet by mouth every 8 (eight) hours as needed for pain. (Patient not taking: Reported on 03/29/2019)  . pomalidomide (POMALYST) 4 MG capsule Take 1 capsule (4 mg total) by mouth daily.  Marland Kitchen triamcinolone cream (KENALOG) 0.1 % Apply BID to affected areas (Patient not taking: Reported on 03/29/2019)   Facility-Administered Encounter Medications as of 03/29/2019  Medication  . 0.9 %  sodium chloride infusion  . 0.9 %  sodium chloride infusion  . sodium chloride 0.9 % injection 10 mL    ALLERGIES:  Allergies  Allergen Reactions  . Hydromorphone Hcl Nausea And Vomiting  . Poison Ivy Extract [Poison Ivy Extract]   . Tape Rash     PHYSICAL EXAM:  ECOG Performance status: 1  Vitals:   03/29/19 0856  BP: (!) 115/31  Pulse: (!) 124  Resp: 18  Temp: 97.9 F (36.6 C)  SpO2: 100%   Filed Weights   03/29/19 0856  Weight: 134 lb 1.6 oz (60.8 kg)    Physical Exam Constitutional:      Appearance: She is well-developed.  Neck:     Musculoskeletal: Normal range of motion and neck supple.  Cardiovascular:     Rate and  Rhythm: Normal rate and regular rhythm.     Heart sounds: Normal heart sounds.  Pulmonary:     Effort: Pulmonary effort is normal.     Breath sounds: Normal breath sounds.  Abdominal:     General: There is no distension.     Palpations: Abdomen is soft. There is no mass.  Musculoskeletal: Normal range of motion.  Skin:    General: Skin is warm.  Neurological:     General: No focal deficit present.  Mental Status: She is alert and oriented to person, place, and time.  Psychiatric:        Mood and Affect: Mood normal.        Behavior: Behavior normal.     LABORATORY DATA:  I have reviewed the labs as listed.  CBC    Component Value Date/Time   WBC 3.5 (L) 03/29/2019 0829   RBC 3.32 (L) 03/29/2019 0829   HGB 10.8 (L) 03/29/2019 0829   HCT 33.3 (L) 03/29/2019 0829   PLT 130 (L) 03/29/2019 0829   MCV 100.3 (H) 03/29/2019 0829   MCH 32.5 03/29/2019 0829   MCHC 32.4 03/29/2019 0829   RDW 13.2 03/29/2019 0829   LYMPHSABS 0.7 03/29/2019 0829   MONOABS 0.4 03/29/2019 0829   EOSABS 0.0 03/29/2019 0829   BASOSABS 0.0 03/29/2019 0829   CMP Latest Ref Rng & Units 03/30/2019 03/29/2019 02/08/2019  Glucose 70 - 99 mg/dL 111(H) 125(H) 100(H)  BUN 8 - 23 mg/dL 20 27(H) 18  Creatinine 0.44 - 1.00 mg/dL 1.03(H) 1.27(H) 0.87  Sodium 135 - 145 mmol/L 139 135 140  Potassium 3.5 - 5.1 mmol/L 4.2 3.7 4.2  Chloride 98 - 111 mmol/L 100 95(L) 103  CO2 22 - 32 mmol/L _0 Calcium 8.9 - 10.3 mg/dL 9.7 10.5(H) 9.6  Total Protein 6.5 - 8.1 g/dL - 9.3(H) 7.7  Total Bilirubin 0.3 - 1.2 mg/dL - 0.7 0.5  Alkaline Phos 38 - 126 U/L - 70 57  AST 15 - 41 U/L - 53(H) 34  ALT 0 - 44 U/L - 45(H) 28    I have personally reviewed her scans.  ASSESSMENT & PLAN:   Multiple myeloma 1.  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 -Revlimid maintenance 5 mg 1 week on 1 week off, discontinued in October 2014  secondary to intolerance from low blood counts. -Zometa discontinued in August 2014. -Last bone marrow biopsy on 04/07/2017 at [facility] negative for any residual myeloma. -Most recent surveillance labs on 02/08/2019 showed resurgence of M spike 0.4 g/dl and elevated free light chain ratio 5.5 with elevated kappa light chains of 94. -Her labs at [facility] on 03/13/2019 showed M spike of 1.9 g/dL and light chain ratio of 12.77 with kappa light chains of 151. -Bone marrow biopsy on 03/14/2019 showed 50% plasma cells.  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  FISH panel is pending. -PET CT scan on 03/10/2019 showed extensive bone involvement in the spine, sternum, bilateral ribs, clavicles, shoulders, pelvic girdle. -Her myeloma labs from 03/29/2019 shows kappa light chains of 243, lambda light chain 12.9 with ratio of 18.84.  M spike showed 2.9 g of monoclonal protein. -Her myeloma numbers are getting worse rather quickly.  We will get her started on myeloma therapy as soon as possible. -Original plan was to use daratumumab-based regimen for normal cytogenetics.  Carfilzomib-based regimen recommended for high risk cytogenetics.  Since her FISH panel is pending at this time, I will proceed with carfilzomib, pomalidomide and dexamethasone based injection.  This will be followed by autologous stem cell transplant.  Upon my discussion with Dr. Norma Fredrickson at [facility], she still has some stem cells left from her first transplant.  We will get her started next week.  2.  Left shoulder pain: -She reported pain in her left shoulder which started only 4 days ago and rates it as 9 out of 10. -She is taking  Percocet half tablet 3 times a day. -She has hypermetabolic lesion in the left distal clavicle and scapula.  -She was evaluated by Dr. [name] and XRT started on 03/27/2019.  3.  New onset numbness: -She reported numbness in the chin area which started few days ago. -She also noted  numbness in the left forearm.  Slight weakness in the left hand. -I have reviewed results of the MR C-spine with and without contrast showing left dorsal and lateral epidural soft tissue present at the level of T1 with some extension to the C7 level superiorly.  There is involvement of the left C7-T1 neural foramina.  No significant canal compromise.  4.  Hypercalcemia: -He developed hypercalcemia with a calcium of 10.5 with albumin 3.3.  Creatinine also increased to 1.27. -We will give Zometa along with normal saline today.  We will repeat her BMP tomorrow.      Orders placed this encounter:  Orders Placed This Encounter  Procedures  . Basic metabolic panel      Derek Jack, MD [place] [phone]

## 2019-03-29 NOTE — Progress Notes (Signed)
zometa and hydration fluids given today per orders.    Patient tolerated it well without problems. Vitals stable and discharged home from clinic ambulatory. Follow up as scheduled.

## 2019-03-30 ENCOUNTER — Ambulatory Visit
Admission: RE | Admit: 2019-03-30 | Discharge: 2019-03-30 | Disposition: A | Discharge status: Home / Self Care | Payer: 59 | Source: Ambulatory Visit | Attending: Radiation Oncology | Admitting: Radiation Oncology

## 2019-03-30 ENCOUNTER — Inpatient Hospital Stay (HOSPITAL_COMMUNITY): Payer: 59

## 2019-03-30 ENCOUNTER — Other Ambulatory Visit: Payer: Self-pay

## 2019-03-30 ENCOUNTER — Encounter (HOSPITAL_COMMUNITY): Payer: Self-pay | Admitting: Hematology

## 2019-03-30 DIAGNOSIS — Z5112 Encounter for antineoplastic immunotherapy: Secondary | ICD-10-CM | POA: Diagnosis not present

## 2019-03-30 DIAGNOSIS — R197 Diarrhea, unspecified: Secondary | ICD-10-CM | POA: Diagnosis not present

## 2019-03-30 DIAGNOSIS — C9002 Multiple myeloma in relapse: Secondary | ICD-10-CM

## 2019-03-30 DIAGNOSIS — C9001 Multiple myeloma in remission: Secondary | ICD-10-CM | POA: Diagnosis not present

## 2019-03-30 DIAGNOSIS — C9000 Multiple myeloma not having achieved remission: Secondary | ICD-10-CM | POA: Diagnosis not present

## 2019-03-30 LAB — KAPPA/LAMBDA LIGHT CHAINS
Kappa free light chain: 243.1 mg/L — ABNORMAL HIGH (ref 3.3–19.4)
Kappa, lambda light chain ratio: 18.84 — ABNORMAL HIGH (ref 0.26–1.65)
Lambda free light chains: 12.9 mg/L (ref 5.7–26.3)

## 2019-03-30 LAB — IMMUNOFIXATION ELECTROPHORESIS
IgA: 2744 mg/dL — ABNORMAL HIGH (ref 87–352)
IgG (Immunoglobin G), Serum: 808 mg/dL (ref 586–1602)
IgM (Immunoglobulin M), Srm: 40 mg/dL (ref 26–217)
Total Protein ELP: 9.2 g/dL — ABNORMAL HIGH (ref 6.0–8.5)

## 2019-03-30 LAB — PROTEIN ELECTROPHORESIS, SERUM
A/G Ratio: 0.6 — ABNORMAL LOW (ref 0.7–1.7)
Albumin ELP: 3.6 g/dL (ref 2.9–4.4)
Alpha-1-Globulin: 0.2 g/dL (ref 0.0–0.4)
Alpha-2-Globulin: 0.9 g/dL (ref 0.4–1.0)
Beta Globulin: 1 g/dL (ref 0.7–1.3)
Gamma Globulin: 3.6 g/dL — ABNORMAL HIGH (ref 0.4–1.8)
Globulin, Total: 5.7 g/dL — ABNORMAL HIGH (ref 2.2–3.9)
M-Spike, %: 2.9 g/dL — ABNORMAL HIGH
Total Protein ELP: 9.3 g/dL — ABNORMAL HIGH (ref 6.0–8.5)

## 2019-03-30 LAB — BASIC METABOLIC PANEL
Anion gap: 13 (ref 5–15)
BUN: 20 mg/dL (ref 8–23)
CO2: 26 mmol/L (ref 22–32)
Calcium: 9.7 mg/dL (ref 8.9–10.3)
Chloride: 100 mmol/L (ref 98–111)
Creatinine, Ser: 1.03 mg/dL — ABNORMAL HIGH (ref 0.44–1.00)
GFR calc Af Amer: >60 mL/min (ref >60)
GFR calc non Af Amer: 59 mL/min — ABNORMAL LOW (ref >60)
Glucose, Bld: 111 mg/dL — ABNORMAL HIGH (ref 70–99)
Potassium: 4.2 mmol/L (ref 3.5–5.1)
Sodium: 139 mmol/L (ref 135–145)

## 2019-03-30 MED ORDER — POMALIDOMIDE 4 MG PO CAPS: 4.0000 mg | ORAL_CAPSULE | Freq: Every day | ORAL | 0 refills | Status: DC

## 2019-03-30 NOTE — Progress Notes (Signed)
START ON PATHWAY REGIMEN - Multiple Myeloma and Other Plasma Cell Dyscrasias     A cycle is every 28 days:     Carfilzomib      Carfilzomib      Carfilzomib      Carfilzomib      Lenalidomide      Dexamethasone   **Always confirm dose/schedule in your pharmacy ordering system**  Patient Characteristics: Relapsed / Refractory, Second through Fourth Lines of Therapy R-ISS Staging: Unknown Disease Classification: Relapsed Line of Therapy: Second Line Intent of Therapy: Non-Curative / Palliative Intent, Discussed with Patient 

## 2019-03-30 NOTE — Assessment & Plan Note (Signed)
1.  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 -Revlimid maintenance 5 mg 1 week on 1 week off, discontinued in October 2014 secondary to intolerance from low blood counts. -Zometa discontinued in August 2014. -Last bone marrow biopsy on 04/07/2017 at [facility] negative for any residual myeloma. -Most recent surveillance labs on 02/08/2019 showed resurgence of M spike 0.4 g/dl and elevated free light chain ratio 5.5 with elevated kappa light chains of 94. -Her labs at [facility] on 03/13/2019 showed M spike of 1.9 g/dL and light chain ratio of 12.77 with kappa light chains of 151. -Bone marrow biopsy on 03/14/2019 showed 50% plasma cells.  Cytogenetics showed 71, XX.  FISH panel is pending. -PET CT scan on 03/10/2019 showed extensive bone involvement in the spine, sternum, bilateral ribs, clavicles, shoulders, pelvic girdle. -Her myeloma labs from 03/29/2019 shows kappa light chains of 243, lambda light chain 12.9 with ratio of 18.84.  M spike showed 2.9 g of monoclonal protein. -Her myeloma numbers are getting worse rather quickly.  We will get her started on myeloma therapy as soon as possible. -Original plan was to use daratumumab-based regimen for normal cytogenetics.  Carfilzomib-based regimen recommended for high risk cytogenetics.  Since her FISH panel is pending at this time, I will proceed with carfilzomib, pomalidomide and dexamethasone based injection.  This will be followed by autologous stem cell transplant.  Upon my discussion with Dr. Norma Fredrickson at [facility], she still has some stem cells left from her first transplant.  We will get her started next week.  2.  Left shoulder pain: -She reported pain in her left shoulder which started only 4 days ago and rates it as 9 out of 10. -She is taking Percocet half tablet 3 times a day. -She has hypermetabolic lesion in  the left distal clavicle and scapula.  -She was evaluated by Dr. [name] and XRT started on 03/27/2019.  3.  New onset numbness: -She reported numbness in the chin area which started few days ago. -She also noted numbness in the left forearm.  Slight weakness in the left hand. -I have reviewed results of the MR C-spine with and without contrast showing left dorsal and lateral epidural soft tissue present at the level of T1 with some extension to the C7 level superiorly.  There is involvement of the left C7-T1 neural foramina.  No significant canal compromise.  4.  Hypercalcemia: -He developed hypercalcemia with a calcium of 10.5 with albumin 3.3.  Creatinine also increased to 1.27. -We will give Zometa along with normal saline today.  We will repeat her BMP tomorrow.

## 2019-03-31 ENCOUNTER — Encounter (HOSPITAL_COMMUNITY): Payer: Self-pay

## 2019-03-31 ENCOUNTER — Other Ambulatory Visit (HOSPITAL_COMMUNITY): Payer: Self-pay | Admitting: Hematology

## 2019-03-31 ENCOUNTER — Ambulatory Visit
Admission: RE | Admit: 2019-03-31 | Discharge: 2019-03-31 | Disposition: A | Discharge status: Home / Self Care | Payer: 59 | Source: Ambulatory Visit | Attending: Radiation Oncology | Admitting: Radiation Oncology

## 2019-03-31 ENCOUNTER — Inpatient Hospital Stay (HOSPITAL_COMMUNITY): Payer: 59

## 2019-03-31 ENCOUNTER — Other Ambulatory Visit (HOSPITAL_COMMUNITY): Payer: Self-pay | Admitting: Nurse Practitioner

## 2019-03-31 ENCOUNTER — Other Ambulatory Visit: Payer: Self-pay

## 2019-03-31 ENCOUNTER — Telehealth (HOSPITAL_COMMUNITY): Payer: Self-pay

## 2019-03-31 VITALS — BP 123/53 | HR 110 | Temp 97.3°F | Resp 18

## 2019-03-31 DIAGNOSIS — Z5112 Encounter for antineoplastic immunotherapy: Secondary | ICD-10-CM | POA: Diagnosis not present

## 2019-03-31 DIAGNOSIS — C9000 Multiple myeloma not having achieved remission: Secondary | ICD-10-CM | POA: Diagnosis not present

## 2019-03-31 DIAGNOSIS — C9001 Multiple myeloma in remission: Secondary | ICD-10-CM | POA: Diagnosis not present

## 2019-03-31 DIAGNOSIS — R197 Diarrhea, unspecified: Secondary | ICD-10-CM | POA: Diagnosis not present

## 2019-03-31 DIAGNOSIS — C9002 Multiple myeloma in relapse: Secondary | ICD-10-CM

## 2019-03-31 LAB — CBC WITH DIFFERENTIAL/PLATELET
Abs Immature Granulocytes: 0.23 10*3/uL — ABNORMAL HIGH (ref 0.00–0.07)
Basophils Absolute: 0 10*3/uL (ref 0.0–0.1)
Basophils Relative: 1 %
Eosinophils Absolute: 0 10*3/uL (ref 0.0–0.5)
Eosinophils Relative: 1 %
HCT: 30.7 % — ABNORMAL LOW (ref 36.0–46.0)
Hemoglobin: 10 g/dL — ABNORMAL LOW (ref 12.0–15.0)
Immature Granulocytes: 7 %
Lymphocytes Relative: 19 %
Lymphs Abs: 0.7 10*3/uL (ref 0.7–4.0)
MCH: 32.6 pg (ref 26.0–34.0)
MCHC: 32.6 g/dL (ref 30.0–36.0)
MCV: 100 fL (ref 80.0–100.0)
Monocytes Absolute: 0.4 10*3/uL (ref 0.1–1.0)
Monocytes Relative: 13 %
Neutro Abs: 2 10*3/uL (ref 1.7–7.7)
Neutrophils Relative %: 59 %
Platelets: 128 10*3/uL — ABNORMAL LOW (ref 150–400)
RBC: 3.07 MIL/uL — ABNORMAL LOW (ref 3.87–5.11)
RDW: 13.2 % (ref 11.5–15.5)
WBC: 3.4 10*3/uL — ABNORMAL LOW (ref 4.0–10.5)
nRBC: 0 % (ref 0.0–0.2)

## 2019-03-31 LAB — COMPREHENSIVE METABOLIC PANEL
ALT: 40 U/L (ref 0–44)
AST: 58 U/L — ABNORMAL HIGH (ref 15–41)
Albumin: 3.1 g/dL — ABNORMAL LOW (ref 3.5–5.0)
Alkaline Phosphatase: 65 U/L (ref 38–126)
Anion gap: 12 (ref 5–15)
BUN: 16 mg/dL (ref 8–23)
CO2: 25 mmol/L (ref 22–32)
Calcium: 9 mg/dL (ref 8.9–10.3)
Chloride: 101 mmol/L (ref 98–111)
Creatinine, Ser: 0.96 mg/dL (ref 0.44–1.00)
GFR calc Af Amer: >60 mL/min (ref >60)
GFR calc non Af Amer: >60 mL/min (ref >60)
Glucose, Bld: 116 mg/dL — ABNORMAL HIGH (ref 70–99)
Potassium: 3.8 mmol/L (ref 3.5–5.1)
Sodium: 138 mmol/L (ref 135–145)
Total Bilirubin: 0.6 mg/dL (ref 0.3–1.2)
Total Protein: 9 g/dL — ABNORMAL HIGH (ref 6.5–8.1)

## 2019-03-31 LAB — URINALYSIS, ROUTINE W REFLEX MICROSCOPIC
Bilirubin Urine: NEGATIVE
Glucose, UA: NEGATIVE mg/dL
Hgb urine dipstick: NEGATIVE
Ketones, ur: NEGATIVE mg/dL
Leukocytes,Ua: NEGATIVE
Nitrite: NEGATIVE
Protein, ur: NEGATIVE mg/dL
Specific Gravity, Urine: 1.011 (ref 1.005–1.030)
pH: 7 (ref 5.0–8.0)

## 2019-03-31 MED ORDER — PROCHLORPERAZINE MALEATE 10 MG PO TABS
ORAL_TABLET | ORAL | Status: AC
  Filled 2019-03-31: qty 1

## 2019-03-31 MED ORDER — DEXAMETHASONE 4 MG PO TABS
40.0000 mg | ORAL_TABLET | Freq: Once | ORAL | Status: AC
  Administered 2019-03-31: 40 mg via ORAL

## 2019-03-31 MED ORDER — DEXAMETHASONE 4 MG PO TABS
ORAL_TABLET | ORAL | Status: AC
  Filled 2019-03-31: qty 10

## 2019-03-31 MED ORDER — SODIUM CHLORIDE 0.9 % IV SOLN
INTRAVENOUS | Status: DC
  Administered 2019-03-31: 11:00:00 via INTRAVENOUS

## 2019-03-31 NOTE — Patient Instructions (Addendum)
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  You will be treated on days 1 and 2, days 8 and 9, and days 15 and 16 every 28 days with carfilzomib (Kyprolis) and dexamethasone in the clinic along with your oral chemotherapy medication, pomalidomide (Pomalyst).  The intent of treatment is to control your disease and to alleviate any symptoms you may be having related to your multiple myeloma.  You will see the doctor regularly throughout treatment.  We will obtain blood work from you prior to every treatment and monitor your results to make sure it is safe to give your treatment. The doctor monitors your response to treatment by the way you are feeling, your blood work, and by obtaining scans periodically.  There will be wait times while you are here for treatment.  It will take about 30 minutes to 1 hour for your lab work to result.  Then there will be wait times while pharmacy mixes your medications.   Carfilzomib (Kyprolis)  About This Drug Carfilzomib is used to treat cancer. It is given in the vein (IV).   Possible Side Effects . Bone marrow suppression. This is a decrease in the number of white blood cells, red blood cells, and platelets. This may raise your risk of infection, make you tired and weak (fatigue), and raise your risk of bleeding. . Nausea . Diarrhea (loose bowel movements) . Tiredness . Swelling of your legs, ankles and/or feet . Fever . Headache . Trouble sleeping . Cough and trouble breathing . Upper respiratory infection . High blood pressure  Note: Each of the side effects above was reported in 20% or greater of patients treated with carfilzomib alone or in combination with other medications. Not all possible side effects are included above.  Warnings and Precautions . Congestive heart failure and/or risk of heart attack which can be life-threatening . Changes in your kidney function which can cause kidney  failure and be life-threatening . Tumor lysis syndrome. This drug may act on the cancer cells very quickly. This may affect how your kidneys work. Tumor lysis can be life-threatening. . Inflammation (swelling) or scarring of the lungs or fluid build-up in your lungs, which can be lifethreatening. You may have a dry cough or trouble breathing which may be severe. . High blood pressure in arteries of the lungs . High blood pressure, which can be life-threatening . Blood clots and events such as heart attack. A blood clot in your leg may cause your leg to swell, appear red and warm, and/or cause pain. A blood clot in your lungs may cause trouble breathing, pain when breathing, and/or chest pain. If you use hormonal contraception, discuss choice of contraception and risk of blood clots with your medical team. . While you are getting this drug in your vein (IV), you may have a reaction to the drug, which can be life-threatening. Sometimes you may be given medication to stop or lessen these side effects. Your nurse will check you closely for these signs: fever or shaking chills, flushing, facial swelling, feeling dizzy, headache, trouble breathing, rash, itching, chest tightness, or chest pain. These reactions may happen after your infusion. If this happens, call 911 for emergency care. . Abnormal bleeding which can be life-threatening - symptoms may be coughing up blood, throwing up blood (may look like coffee grounds), red or black tarry bowel movements, abnormally heavy menstrual flow, nosebleeds, or any other unusual bleeding. . Severe decrease in platelets  which can increase your risk of bleeding . Rare blood vessels disorder called thrombotic microangiopathy which can affect some of your organs in your body such as your kidneys and can be life-threatening . Changes in your liver function, which can cause liver failure and be life-threatening . Changes in your central nervous system can happen.  The central nervous system is made up of your brain and spinal cord. You could feel extreme tiredness, agitation, confusion, hallucinations (see or hear things that are not there), trouble understanding or speaking, loss of control of your bowels or bladder, eyesight changes, numbness, or lack of strength to your arms, legs, face, or body, and coma. If you start to have any of these symptoms let your doctor know right away.  Note: Some of the side effects above are very rare. If you have concerns and/or questions, please discuss them with your medical team.  Important Information . This drug may be present in the saliva, tears, sweat, urine, stool, vomit, semen, and vaginal secretions. Talk to your doctor and/or your nurse about the necessary precautions to take during this time. . This drug may impair your ability to drive or use machinery. Use caution and tell your nurse or doctor if you feel dizzy, very sleepy, and/or experience low blood pressure.  Treating Side Effects . Manage tiredness by pacing your activities for the day. . Be sure to include periods of rest between energy-draining activities. . To decrease the risk of infection, wash your hands regularly. . Avoid close contact with people who have a cold, the flu, or other infections. . Take your temperature as your doctor or nurse tells you, and whenever you feel like you may have a fever. . To help decrease the risk of bleeding, use a soft toothbrush. Check with your nurse before using dental floss. . Be very careful when using knives or tools. . Use an electric shaver instead of a razor. . Drink plenty of fluids (a minimum of eight glasses per day is recommended). . If you throw up or have loose bowel movements, you should drink more fluids so that you do not become dehydrated (lack of water in the body from losing too much fluid). . To help with nausea and vomiting, eat small, frequent meals instead of three large meals a  day. Choose foods and drinks that are at room temperature. Ask your nurse or doctor about other helpful tips and medicine that is available to help stop or lessen these symptoms. . If you have diarrhea, eat low-fiber foods that are high in protein and calories and avoid foods that can irritate your digestive tracts or lead to cramping. . Ask your nurse or doctor about medicine that can lessen or stop your diarrhea. Marland Kitchen Keeping your pain under control is important to your well-being. Please tell your doctor or nurse if you are experiencing pain. . If you are having trouble sleeping, talk to your nurse or doctor on tips to help you sleep better. . Infusion reactions may occur after your infusion. If this happens, call 911 for emergency care.  Food and Drug Interactions . There are no known interactions of carfilzomib with food. . This drug may interact with other medicines. Tell your doctor and pharmacist about all the prescription and over-the-counter medicines and dietary supplements (vitamins, minerals, herbs, and others) that you are taking at this time. Also, check with your doctor or pharmacist before starting any new prescription or over-the-counter medicines, or dietary supplements to make sure that  there are no interactions.  When to Call the Doctor Call your doctor or nurse if you have any of these symptoms and/or any new or unusual symptoms: . Fever of 100.4 F (38 C) or higher . Chills . Tiredness that interferes with your daily activities . A headache that does not go away . Blurry vision or other changes in eyesight . Confusion and/or agitation . Hallucinations . Trouble understanding or speaking . Numbness or lack of strength to your arms, legs, face, or body . Feeling dizzy or lightheaded . Trouble falling or staying asleep . Easy bleeding or bruising . Dry cough or coughing up yellow, green, or bloody mucus . Wheezing or trouble breathing . Lips or skin turn a bluish  color . Symptoms of a seizure such as confusion, blacking out, passing out, loss of hearing or vision, blurred vision, unusual smells or tastes (such as burning rubber), trouble talking, tremors or shaking in parts or all of the body, repeated body movements, tense muscles that do not relax, and loss of control of urine and bowels. If you or your family member suspects you are having a seizure, call 911 right away. . Chest pain or symptoms of a heart attack. Most heart attacks involve pain in the center of the chest that lasts more than a few minutes. The pain may go away and come back or it can be constant. It can feel like pressure, squeezing, fullness, or pain. Sometimes pain is felt in one or both arms, the back, neck, jaw, or stomach. If any of these symptoms last 2 minutes, call 911. Marland Kitchen Feeling that your heart is beating fast or in a not normal way (palpitations) . Nausea that stops you from eating or drinking and/or is not relieved by prescribed medicines . Throwing up more than 3 times a day . Diarrhea, 4 times in one day or diarrhea with lack of strength or a feeling of being dizzy . Your leg or arm is swollen, red, warm, and/or painful . Swelling of arms, hands, legs and/or feet . Weight gain of 5 pounds in one week (fluid retention) . Blood in your urine, vomit (bright red or coffee-ground) and/or stools (bright red, or black/tarry) . Decreased urine or very dark urine . Signs of infusion reaction: fever or shaking chills, flushing, facial swelling, feeling dizzy, headache, trouble breathing, rash, itching, chest tightness, or chest pain. If this happens, call 911 for emergency care. . Signs of possible liver problems: dark urine, pale bowel movements, bad stomach pain, feeling very tired and weak, unusual itching, or yellowing of the eyes or skin . Signs of tumor lysis: confusion or agitation, decreased urine, nausea/vomiting, diarrhea, muscle cramping, numbness and/or tingling,  or seizures . Pain that does not go away or is not relieved by prescribed medicine . If you think you may be pregnant or may have impregnated your partner  Reproduction Warnings . Pregnancy warning: This drug can have harmful effects on the unborn baby. Women of childbearing potential should use effective methods of birth control during your cancer treatment and for at least 6 months after treatment. Men with female partners of childbearing potential should use effective methods of birth control during your cancer treatment and for at least 3 months after your cancer treatment. Let your doctor know right away if you think you may be pregnant or may have impregnated your partner. . Breastfeeding warning: Women should not breastfeed during treatment and for 2 weeks after treatment because this drug could enter  the breast milk and cause harm to a breastfeeding baby. . Fertility warning: In men and women both, this drug may affect your ability to have children in the future. Talk with your doctor or nurse if you plan to have children. Ask for information on sperm or egg banking.  Pomalidomide (Pomalyst)  About This Drug Pomalidomide is used to treat cancer. It is given orally (by mouth).  Possible Side Effects . Bone marrow suppression. This is a decrease in the number of white blood cells, red blood cells, and platelets. This may raise your risk of infection, make you tired and weak (fatigue), and raise your risk of bleeding. . Nausea . Diarrhea (loose bowel movements) . Constipation (not able to move bowels) . Fever . Tiredness and weakness . Blood sugar levels may change. . Electrolyte changes . Changes in your liver and renal function . Back pain . Upper respiratory infection . Trouble breathing . Rash  Note: Each of the side effects above was reported in 30% or greater of patients treated with pomalidomide. Your side effects may be different depending on your disease. Not all  possible side effects are included above.  Warnings and Precautions . Blood clots and events such as stroke and heart attack. A blood clot in your leg may cause your leg to swell, appear red and warm, and/or cause pain. A blood clot in your lungs may cause trouble breathing, pain when breathing, and/or chest pain. . Severe bone marrow suppression . Changes in your liver function, which can cause liver failure and be life-threatening . Allergic reactions, including anaphylaxis are rare but may happen in some patients. Signs of allergic reaction to this drug may be swelling of the face, feeling like your tongue or throat are swelling, trouble breathing, rash, itching, fever, chills, feeling dizzy, and/or feeling that your heart is beating in a fast or not normal way. If this happens, do not take another dose of this drug. You should get urgent medical treatment. . Severe allergic skin reaction which can be life-threatening. You may develop blisters on your skin that are filled with fluid or a severe red rash all over your body that may be painful. . Confusion and/or feeling dizzy, which may impair your ability to drive or use machinery. Use caution and tell your nurse or doctor if you feel dizzy or confused. . Effects on the nerves are called peripheral neuropathy. You may feel numbness, tingling, or pain in your hands and feet. It may be hard for you to button your clothes, open jars, or walk as usual. The effect on the nerves may get worse with more doses of the drug. These effects get better in some people after the drug is stopped but it does not get better in all people. . Tumor lysis syndrome: This drug may act on the cancer cells very quickly. This may affect how your kidneys work and can be life-threatening. . This drug may raise your risk of getting a second cancer. Note: Some of the side effects above are very rare. If you have concerns and/or questions, please discuss them with  your medical team.  Important Information . You will need to sign up for a special program called Pomalyst REMS when you start taking this drug. Your nurse will help you get started. . Two negative pregnancy tests are required in women of childbearing potential prior to starting treatment. Routine pregnancy tests are required during treatment. . Do not donate blood during your treatment and for  4 weeks after your treatment . Men should not donate sperm during your treatment because this drug is present in semen and may cause harm to a baby. . Avoid smoking while taking pomalidomide as this may lower the levels of the drug in your body, which can make it less effective.  How to Take Your Medication . Swallow the medicine whole with water, with or without food. Do not chew, break, or open it. . Take this medicine as directed at the same time each day. . Missed dose: If you miss a dose, take it as soon as you think about it ONLY if it has been less than 12 hours since you normally take the missed dose. If it has been more than 12 hours, skip the missed dose and contact your doctor. Take your next dose at the regular time. Do not take 2 doses at the same time and do not double up on the next dose. . If you vomit a dose, take your next dose at the regular time. . Handling: Wash your hands after handling your medicine; your caretakers should not handle your medicine with bare hands and should wear latex gloves. . If you get any of the content of a broken capsules on your skin or inside of your mouth, you should wash the area of the skin well with soap and water right away. Flush your mouth with flowing water. Call your doctor if you get a skin reaction. . This drug may be present in the saliva, tears, sweat, urine, stool, vomit, semen, and vaginal secretions. Talk to your doctor and/or your nurse about the necessary precautions to take during this time. . Storage: Store this medicine in the  original container at room temperature. . Disposal of unused medicine: Do not flush any expired and/or unused medicine down the toilet or drain unless you are specifically instructed to do so on the medication label. Some facilities have take-back programs and/or other options. If you do not have a take-back program in your area, then please discuss with your nurse or your doctor how to dispose of unused medicine.  Treating Side Effects . Manage tiredness by pacing your activities for the day. . Be sure to include periods of rest between energy-draining activities. . To decrease the risk of infection, wash your hands regularly. . Avoid close contact with people who have a cold, the flu, or other infections. . Take your temperature as your doctor or nurse tells you, and whenever you feel like you may have a fever. . To help decrease the risk of bleeding, use a soft toothbrush. Check with your nurse before using dental floss. . Be very careful when using knives or tools. . Use an electric shaver instead of a razor. . Drink plenty of fluids (a minimum of eight glasses per day is recommended). . If you throw up or have loose bowel movements, you should drink more fluids so that you do not become dehydrated (lack of water in the body from losing too much fluid). . If you have diarrhea, eat low-fiber foods that are high in protein and calories and avoid foods that can irritate your digestive tracts or lead to cramping. . Ask your nurse or doctor about medicine that can lessen or stop your diarrhea and/or constipation . To help with nausea and vomiting, eat small, frequent meals instead of three large meals a day. Choose foods and drinks that are at room temperature. Ask your nurse or doctor about other helpful tips  and medicine that is available to help stop or lessen these symptoms. . If you are not able to move your bowels, check with your doctor or nurse before you use enemas, laxatives, or  suppositories. Marland Kitchen Keeping your pain under control is important to your well-being. Please tell your doctor or nurse if you are experiencing pain. . If you have numbness and tingling in your hands and feet, be careful when cooking, walking, and handling sharp objects and hot liquids. . If you ae dizzy, get up slowly after sitting or lying. . If you get a rash do not put anything on it unless your doctor or nurse says you may. Keep the area around the rash clean and dry. Ask your doctor for medicine if your rash bothers you. . If you have diabetes, keep good control of your blood sugar level. Tell your nurse or your doctor if your glucose levels are higher or lower than normal.  Food and Drug Interactions . There are no known interactions of pomalidomide with food. . Check with your doctor or pharmacist about all other prescription medicines and over-the-counter medicines and dietary supplements (vitamins, minerals, herbs and others) you are taking before starting this medicine as there are known drug interactions with pomalidomide. Also, check with your doctor or pharmacist before starting any new prescription or over-the-counter medicines, or dietary supplements to make sure that there are no interactions.  When to Call the Doctor Call your doctor or nurse if you have any of these symptoms and/or any new or unusual symptoms: . Fever of 100.4 F (38 C) or higher . Chills . Tiredness that interferes with your daily activities . Feeling dizzy or lightheaded . Easy bleeding or bruising . Your leg or arm is swollen, red, warm and/or painful . Coughing up yellow, green, or bloody mucus . Wheezing or trouble breathing . Symptoms of a stroke such as sudden numbness or weakness of your face, arm, or leg, mostly on one side of your body; sudden confusion, trouble speaking or understanding; sudden trouble seeing in one or both eyes; sudden trouble walking, feeling dizzy, loss of balance or  coordination; or sudden, bad headache with no known cause. If you have any of these symptoms for 2 minutes, call 911. . Chest pain or symptoms of a heart attack. Most heart attacks involve pain in the center of the chest that lasts more than a few minutes. The pain may go away and come back. It can feel like pressure, squeezing, fullness, or pain. Sometimes pain is felt in one or both arms, the back, neck, jaw, or stomach. If any of these symptoms last 2 minutes, call 911. . Nausea that stops you from eating or drinking and/or is not relieved by prescribed medicines . Diarrhea, 4 times in one day or diarrhea with lack of strength or a feeling of being dizzy . No bowel movement in 3 days or when you feel uncomfortable . Lasting loss of appetite or rapid weight loss of five pounds in a week . Pain that does not go away, or is not relieved by prescribed medicines . Signs of possible liver problems: dark urine, pale bowel movements, bad stomach pain, feeling very tired and weak, unusual itching, or yellowing of the eyes or skin . Decreased urine or very dark urine . Signs of tumor lysis: Confusion or agitation, decreased urine, nausea/vomiting, diarrhea, muscle cramping, numbness and/or tingling, seizures. . Signs of allergic reaction: swelling of the face, feeling like your tongue or throat  are swelling, trouble breathing, rash, itching, fever, chills, feeling dizzy, and/or feeling that your heart is beating in a fast or not normal way. If this happens, call 911 for emergency care. . Numbness, tingling, or pain in your hands and feet . Flu-like symptoms: fever, headache, muscle and joint aches, and fatigue (low energy, feeling weak) . A new rash or a rash that is not relieved by prescribed medicines . Abnormal blood sugar . Unusual thirst, passing urine often, headache, sweating, shakiness, irritability . If you think you may be pregnant or may have impregnated your partner  Reproduction  Warnings . Pregnancy warning: This drug can have harmful effects on the unborn baby. Women of childbearing potential must commit to abstain from heterosexual intercourse or use 2 effective methods of birth control, one of which must be a highly effective method of birth control, beginning 4 weeks before treatment starts, during your cancer treatment, including dose interruptions, and for at least 4 weeks after treatment. A highly effective method of birth control includes tubal ligation, intrauterine device (IUD), hormonal (birth control pills, injections, patch and/or implants) or a partner's vasectomy. Stop taking pomalidomide immediately and let your doctor know right away if you think you may be pregnant. . Men with female partners of childbearing potential should use effective methods of birth control during your cancer treatment and for at least 4 weeks after your cancer treatment. You should always wear a condom even if you have undergone a successful vasectomy. Let your doctor know right away if you think you may have impregnated your partner. . Breastfeeding warning: Women should not breastfeed during treatment because this drug could enter the breast milk and cause harm to a breastfeeding baby. . Fertility warning: In women, this drug may affect your ability to have children in the future. Talk with your doctor or nurse if you plan to have children. Ask for information on egg banking.   Dexamethasone (Decadron)  About This Drug Dexamethasone is used to treat cancer, to decrease inflammation and sometimes used before and after chemotherapy to prevent or treat nausea and/or vomiting. It is given in the vein (IV) or orally (by mouth).  You will receive this medication IV on days of treatment.   Possible Side Effects . Headache . High blood pressure . Abnormal heartbeat . Tiredness and weakness . Changes in mood, which may include depression or a feeling of extreme well-being .  Trouble sleeping . Increased sweating . Increased appetite (increased hunger) . Weight gain . Increase risk of infections . Pain in your abdomen . Nausea . Skin changes such as rash, dryness, redness . Blood sugar levels may change . Electrolyte changes . Swelling of your legs, ankles and/or feet . Changes in your liver function . You may be at risk for cataracts, glaucoma or infections of the eye . Muscle loss and/or weakness (lack of muscle strength) . Increased risk of developing osteoporosis- your bones may become weak and brittle  Note: Not all possible side effects are included above.  Warnings and Precautions . This drug may cause you to feel irritable, nervous or restless. . Allergic reactions, including anaphylaxis are rare but may happen in some patients. Signs of allergic reaction to this drug may be swelling of the face, feeling like your tongue or throat are swelling, trouble breathing, rash, itching, fever, chills, feeling dizzy, and/or feeling that your heart is beating in a fast or not normal way. If this happens, do not take another dose of this drug.  You should get urgent medical treatment. . High blood pressure and changes in electrolytes, which can cause fluid build-up around your heart, lungs or elsewhere. . Increased risk of developing a hole in your stomach, small, and/or large intestine if you have ulcers in the lining of your stomach and/or intestine, or have diverticulitis, ulcerative colitis and/or other diseases that affect the gastrointestinal tract. . Effects on the endocrine glands including the pituitary, adrenals or thyroid during or after use of this medication. . Changes in the tissue of the heart, that can cause your heart to have less ability to pump blood. You may be short of breath or our arms, hands, legs and feet may swell. . Increased risk of heart attack . Severe depression and other psychiatric disorders such as mood changes. . Burning,  pain and itching around your anus may happen when this drug is given in the vein too rapidly (IV). It usually happens suddenly and resolves in less than 1 minute.  Important Information . Talk to your doctor or your nurse before stopping this medication, it should be stopped gradually. Depending on the dose and length of treatment, you could experience serious side effects if stopped abruptly (suddenly). . Talk to your doctor before receiving any vaccinations during your treatment. Some vaccinations are not recommended while receiving dexamethasone.  How to Take Your Medication . For Oral (by mouth): You can take the medicine with or without food. If you have nausea or upset stomach, take it with food. . Missed dose: If you miss a dose, do not take 2 doses at the same time or extra doses. . If you vomit a dose, take your next dose at the regular time. Do not take 2 doses at the same time . Handling: Wash your hands after handling your medicine, your caretakers should not handle your medicine with bare hands and should wear latex gloves. . Storage: Store this medicine in the original container at room temperature. Protect from moisture and light. Discuss with your nurse or your doctor how to dispose of unused medicine.  Treating Side Effects . Drink plenty of fluids (a minimum of eight glasses per day is recommended). . To help with nausea and vomiting, eat small, frequent meals instead of three large meals a day. Choose foods and drinks that are at room temperature. Ask your nurse or doctor about other helpful tips and medicine that is available to help stop or lessen these symptoms. . If you throw up, you should drink more fluids so that you do not become dehydrated (lack of water in the body from losing too much fluid). . Manage tiredness by pacing your activities for the day. . Be sure to include periods of rest between energy-draining activities. . To help with muscle weakness, get  regular exercise. If you feel too tired to exercise vigorously, try taking a short walk. . If you are having trouble sleeping, talk to your nurse or doctor on tips to help you sleep better. . If you are feeling depressed, talk to your nurse or doctor about it. Marland Kitchen Keeping your pain under control is important to your well-being. Please tell your doctor or nurse if you are experiencing pain. . If you have diabetes, keep good control of your blood sugar level. Tell your nurse or your doctor if your glucose levels are higher or lower than normal. . To decrease the risk of infection, wash your hands regularly. . Avoid close contact with people who have a cold, the flu,  or other infections. . Take your temperature as your doctor or nurse tells you, and whenever you feel like you may have a fever. . If you get a rash do not put anything on it unless your doctor or nurse says you may. Keep the area around the rash clean and dry. Ask your doctor for medicine if your rash bothers you. . Moisturize your skin several times a day. . Avoid sun exposure and apply sunscreen routinely when outdoors.  Food and Drug Interactions . This drug may interact with grapefruit and grapefruit juice. Talk to your doctor as this could make side effects worse. . Check with your doctor or pharmacist about all other prescription medicines and over-the-counter medicines and dietary supplements (vitamins, minerals, herbs and others) you are taking before starting this medicine as there are known drug interactions with dexamethasone. Also, check with your doctor or pharmacist before starting any new prescription or over-the-counter medicines, or dietary supplement to make sure that there are no interactions. . There are known interactions of dexamethasone with other medicines and products like acetaminophen, aspirin, and ibuprofen. Ask your doctor what over-the-counter (OTC) medicines you can take. Marland Kitchen Avoid the use of St.  John's Wort while taking dexamethasone as this may lower the levels of the drug in your body, which can make it less effective When to Call the Doctor Call your doctor or nurse if you have any of these symptoms and/or any new or unusual symptoms: . Fever of 100.4 F (38 C) or higher . Chills . A headache that does not go away . Trouble breathing . Blurry vision or other changes in eyesight . Feel irritable, nervous or restless . Trouble falling or staying asleep . Severe mood changes such as depression or unusual thoughts and/or behaviors . Thoughts of hurting yourself or others, and suicide . Tiredness that interferes with your daily activities . Feeling that your heart is beating in a fast, slow or not normal way . Feeling dizzy or lightheaded . Chest pain or symptoms of a heart attack. Most heart attacks involve pain in the center of the chest that lasts more than a few minutes. The pain may go away and come back, or it can be constant. It can feel like pressure, squeezing, fullness, or pain. Sometimes pain is felt in one or both arms, the back, neck, jaw, or stomach. If any of these symptoms last 2 minutes, call 911. Marland Kitchen Heartburn or indigestion . Nausea that stops you from eating or drinking and/or is not relieved by prescribed medicines . Throwing up more than 3 times a day . Pain in your abdomen that does not go away . Abnormal blood sugar . Unusual thirst, passing urine often, headache, sweating, shakiness, irritability . Swelling of legs, ankles, or feet . Weight gain of 5 pounds in one week (fluid retention) . Signs of possible liver problems: dark urine, pale bowel movements, bad stomach pain, feeling very tired and weak, unusual itching, or yellowing of the eyes or skin . Severe muscle weakness . A new rash or a rash that is not relieved by prescribed medicines . Signs of allergic reaction: swelling of the face, feeling like your tongue or throat are swelling,  trouble breathing, rash, itching, fever, chills, feeling dizzy, and/or feeling that your heart is beating in a fast or not normal way. If this happens, call 911 for emergency care. . If you think you may be pregnant  Reproduction Warnings . Pregnancy warning: It is not known if  this drug may harm an unborn child. For this reason, be sure to talk with your doctor if you are pregnant or planning to become pregnant while receiving this drug. Let your doctor know right away if you think you may be pregnant or may have impregnated your partner. . Breastfeeding warning: It is not known if this drug passes into breast milk. For this reason, women should talk to their doctor about the risks and benefits of breastfeeding during treatment with this drug because this drug may enter the breast milk and cause harm to a breastfeeding baby. . Fertility warning: Human fertility studies have not been done with this drug. Talk with your doctor or nurse if you plan to have children. Ask for information on sperm banking.  SELF CARE ACTIVITIES WHILE RECEIVING CHEMOTHERAPY:  Hydration Increase your fluid intake 48 hours prior to treatment and drink at least 8 to 12 cups (64 ounces) of water/decaffeinated beverages per day after treatment. You can still have your cup of coffee or soda but these beverages do not count as part of your 8 to 12 cups that you need to drink daily. No alcohol intake.  Medications Continue taking your normal prescription medication as prescribed.  If you start any new herbal or new supplements please let us know first to make sure it is safe.  Mouth Care Have teeth cleaned professionally before starting treatment. Keep dentures and partial plates clean. Use soft toothbrush and do not use mouthwashes that contain alcohol. Biotene is a good mouthwash that is available at most pharmacies or may be ordered by calling [phone]. Use warm salt water gargles (1 teaspoon salt per 1 quart  warm water) before and after meals and at bedtime. If you need dental work, please let the doctor know before you go for your appointment so that we can coordinate the best possible time for you in regards to your chemo regimen. You need to also let your dentist know that you are actively taking chemo. We may need to do labs prior to your dental appointment.  Skin Care Always use sunscreen that has not expired and with SPF (Sun Protection Factor) of 50 or higher. Wear hats to protect your head from the sun. Remember to use sunscreen on your hands, ears, face, & feet.  Use good moisturizing lotions such as udder cream, eucerin, or even Vaseline. Some chemotherapies can cause dry skin, color changes in your skin and nails.    . Avoid long, hot showers or baths. . Use gentle, fragrance-free soaps and laundry detergent. . Use moisturizers, preferably creams or ointments rather than lotions because the thicker consistency is better at preventing skin dehydration. Apply the cream or ointment within 15 minutes of showering. Reapply moisturizer at night, and moisturize your hands every time after you wash them.  Hair Loss (if your doctor says your hair will fall out)  . If your doctor says that your hair is likely to fall out, decide before you begin chemo whether you want to wear a wig. You may want to shop before treatment to match your hair color. . Hats, turbans, and scarves can also camouflage hair loss, although some people prefer to leave their heads uncovered. If you go bare-headed outdoors, be sure to use sunscreen on your scalp. . Cut your hair short. It eases the inconvenience of shedding lots of hair, but it also can reduce the emotional impact of watching your hair fall out. . Don't perm or color your hair during  chemotherapy. Those chemical treatments are already damaging to hair and can enhance hair loss. Once your chemo treatments are done and your hair has grown back, it's OK to resume dyeing  or perming hair.  With chemotherapy, hair loss is almost always temporary. But when it grows back, it may be a different color or texture. In older adults who still had hair color before chemotherapy, the new growth may be completely gray.  Often, new hair is very fine and soft.  Infection Prevention Please wash your hands for at least 30 seconds using warm soapy water. Handwashing is the #1 way to prevent the spread of germs. Stay away from sick people or people who are getting over a cold. If you develop respiratory systems such as green/yellow mucus production or productive cough or persistent cough let us know and we will see if you need an antibiotic. It is a good idea to keep a pair of gloves on when going into grocery stores/[employer] to decrease your risk of coming into contact with germs on the carts, etc. Carry alcohol hand gel with you at all times and use it frequently if out in public. If your temperature reaches 100.5 or higher please call the clinic and let us know.  If it is after hours or on the weekend please go to the ER if your temperature is over 100.5.  Please have your own personal thermometer at home to use.    Sex and bodily fluids If you are going to have sex, a condom must be used to protect the person that isn't taking chemotherapy. Chemo can decrease your libido (sex drive). For a few days after chemotherapy, chemotherapy can be excreted through your bodily fluids.  When using the toilet please close the lid and flush the toilet twice.  Do this for a few day after you have had chemotherapy.   Effects of chemotherapy on your sex life Some changes are simple and won't last long. They won't affect your sex life permanently.  Sometimes you may feel: . too tired . not strong enough to be very active . sick or sore  . not in the mood . anxious or low Your anxiety might not seem related to sex. For example, you may be worried about the cancer and how your treatment is going. Or  you may be worried about money, or about how you family are coping with your illness. These things can cause stress, which can affect your interest in sex. It's important to talk to your partner about how you feel. Remember - the changes to your sex life don't usually last long. There's usually no medical reason to stop having sex during chemo. The drugs won't have any long term physical effects on your performance or enjoyment of sex. Cancer can't be passed on to your partner during sex  Contraception It's important to use reliable contraception during treatment. Avoid getting pregnant while you or your partner are having chemotherapy. This is because the drugs may harm the baby. Sometimes chemotherapy drugs can leave a man or woman infertile.  This means you would not be able to have children in the future. You might want to talk to someone about permanent infertility. It can be very difficult to learn that you may no longer be able to have children. Some people find counselling helpful. There might be ways to preserve your fertility, although this is easier for men than for women. You may want to speak to a fertility expert. You  can talk about sperm banking or harvesting your eggs. You can also ask about other fertility options, such as donor eggs. If you have or have had breast cancer, your doctor might advise you not to take the contraceptive pill. This is because the hormones in it might affect the cancer.  It is not known for sure whether or not chemotherapy drugs can be passed on through semen or secretions from the vagina. Because of this some doctors advise people to use a barrier method if you have sex during treatment. This applies to vaginal, anal or oral sex. Generally, doctors advise a barrier method only for the time you are actually having the treatment and for about a week after your treatment. Advice like this can be worrying, but this does not mean that you have to avoid being intimate  with your partner. You can still have close contact with your partner and continue to enjoy sex.  Animals If you have cats or birds we just ask that you not change the litter or change the cage.  Please have someone else do this for you while you are on chemotherapy.   Food Safety During and After Cancer Treatment Food safety is important for people both during and after cancer treatment. Cancer and cancer treatments, such as chemotherapy, radiation therapy, and stem cell/bone marrow transplantation, often weaken the immune system. This makes it harder for your body to protect itself from foodborne illness, also called food poisoning. Foodborne illness is caused by eating food that contains harmful bacteria, parasites, or viruses.  Foods to avoid Some foods have a higher risk of becoming tainted with bacteria. These include: Marland Kitchen Unwashed fresh fruit and vegetables, especially leafy vegetables that can hide dirt and other contaminants . Raw sprouts, such as alfalfa sprouts . Raw or undercooked beef, especially ground beef, or other raw or undercooked meat and poultry . Fatty, fried, or spicy foods immediately before or after treatment.  These can sit heavy on your stomach and make you feel nauseous. . Raw or undercooked shellfish, such as oysters. . Sushi and sashimi, which often contain raw fish.  . Unpasteurized beverages, such as unpasteurized fruit juices, raw milk, raw yogurt, or cider . Undercooked eggs, such as soft boiled, over easy, and poached; raw, unpasteurized eggs; or foods made with raw egg, such as homemade raw cookie dough and homemade mayonnaise  Simple steps for food safety  Shop smart. . Do not buy food stored or displayed in an unclean area. . Do not buy bruised or damaged fruits or vegetables. . Do not buy cans that have cracks, dents, or bulges. . Pick up foods that can spoil at the end of your shopping trip and store them in a cooler on the way home.  Prepare and clean  up foods carefully. . Rinse all fresh fruits and vegetables under running water, and dry them with a clean towel or paper towel. . Clean the top of cans before opening them. . After preparing food, wash your hands for 20 seconds with hot water and soap. Pay special attention to areas between fingers and under nails. . Clean your utensils and dishes with hot water and soap. Marland Kitchen Disinfect your kitchen and cutting boards using 1 teaspoon of liquid, unscented bleach mixed into 1 quart of water.    Dispose of old food. . Eat canned and packaged food before its expiration date (the "use by" or "best before" date). . Consume refrigerated leftovers within 3 to 4 days. After  that time, throw out the food. Even if the food does not smell or look spoiled, it still may be unsafe. Some bacteria, such as Listeria, can grow even on foods stored in the refrigerator if they are kept for too long.  Take precautions when eating out. . At restaurants, avoid buffets and salad bars where food sits out for a long time and comes in contact with many people. Food can become contaminated when someone with a virus, often a norovirus, or another "bug" handles it. . Put any leftover food in a "to-go" container yourself, rather than having the server do it. And, refrigerate leftovers as soon as you get home. . Choose restaurants that are clean and that are willing to prepare your food as you order it cooked.   AT HOME MEDICATIONS:                                                                                                                                                                Compazine/Prochlorperazine 35m tablet. Take 1 tablet every 6 hours as needed for nausea/vomiting. (This can make you sleepy)   EMLA cream. Apply a quarter size amount to port site 1 hour prior to chemo. Do not rub in. Cover with plastic wrap.    Diarrhea Sheet   If you are having loose stools/diarrhea, please purchase Imodium and begin  taking as outlined:  At the first sign of poorly formed or loose stools you should begin taking Imodium (loperamide) 2 mg capsules.  Take two tablets (450m followed by one tablet (88m46mevery 2 hours - DO NOT EXCEED 8 tablets in 24 hours.  If it is bedtime and you are having loose stools, take 2 tablets at bedtime, then 2 tablets every 4 hours until morning.   Always call the [place] you are having loose stools/diarrhea that you can't get under control.  Loose stools/diarrhea leads to dehydration (loss of water) in your body.  We have other options of trying to get the loose stools/diarrhea to stop but you must let us [place]!   Constipation Sheet  Colace - 100 mg capsules - take 2 capsules daily.  If this doesn't help then you can increase to 2 capsules twice daily.  Please call if the above does not work for you. Do not go more than 2 days without a bowel movement.  It is very important that you do not become constipated.  It will make you feel sick to your stomach (nausea) and can cause abdominal pain and vomiting.  Nausea Sheet   Compazine/Prochlorperazine 35m15mblet. Take 1 tablet every 6 hours as needed for nausea/vomiting (This can make you drowsy).  If you are having persistent nausea (nausea that does not stop) please call the [place]  let us know the amount of nausea that you are experiencing.  If you begin to vomit, you need to call the [facility] and if it is the weekend and you have vomited more than one time and can't get it to stop-go to the Emergency Room.  Persistent nausea/vomiting can lead to dehydration (loss of fluid in your body) and will make you feel very weak and unwell. Ice chips, sips of clear liquids, foods that are at room temperature, crackers, and toast tend to be better tolerated.    SYMPTOMS TO REPORT AS SOON AS POSSIBLE AFTER TREATMENT:  FEVER GREATER THAN 100.5 F  CHILLS WITH OR WITHOUT FEVER  NAUSEA AND VOMITING THAT IS NOT CONTROLLED WITH  YOUR NAUSEA MEDICATION  UNUSUAL SHORTNESS OF BREATH  UNUSUAL BRUISING OR BLEEDING  TENDERNESS IN MOUTH AND THROAT WITH OR WITHOUT   PRESENCE OF ULCERS  URINARY PROBLEMS  BOWEL PROBLEMS  UNUSUAL RASH    Wear comfortable clothing and clothing appropriate for easy access to any Portacath or PICC line. Let us know if there is anything that we can do to make your therapy better!   What to do if you need assistance after hours or on the weekends: CALL [phone].  HOLD on the line, do not hang up.  You will hear multiple messages but at the end you will be connected with a nurse triage line.  They will contact the doctor if necessary.  Most of the time they will be able to assist you.  Do not call the hospital operator.      I have been informed and understand all of the instructions given to me and have received a copy. I have been instructed to call the clinic [phone] or my family physician as soon as possible for continued medical care, if indicated. I do not have any more questions at this time but understand that I may call the [facility] or the Patient Navigator at [phone] during office hours should I have questions or need assistance in obtaining follow-up care.

## 2019-03-31 NOTE — Progress Notes (Signed)
Hydration fluids and labs drawn per orders today. Urine specimen sent for tests per orders.   Patient tolerated it well without problems. Vitals stable and discharged home from clinic ambulatory. Follow up as scheduled.

## 2019-03-31 NOTE — Telephone Encounter (Signed)
Left message on patient voicemail regarding her lab results from today.

## 2019-04-02 LAB — URINE CULTURE

## 2019-04-03 ENCOUNTER — Inpatient Hospital Stay (HOSPITAL_COMMUNITY): Payer: 59

## 2019-04-03 ENCOUNTER — Other Ambulatory Visit: Payer: Self-pay

## 2019-04-03 ENCOUNTER — Ambulatory Visit
Admission: RE | Admit: 2019-04-03 | Discharge: 2019-04-03 | Disposition: A | Discharge status: Home / Self Care | Payer: 59 | Source: Ambulatory Visit | Attending: Radiation Oncology | Admitting: Radiation Oncology

## 2019-04-03 DIAGNOSIS — C9002 Multiple myeloma in relapse: Secondary | ICD-10-CM

## 2019-04-03 DIAGNOSIS — R197 Diarrhea, unspecified: Secondary | ICD-10-CM | POA: Diagnosis not present

## 2019-04-03 DIAGNOSIS — C9000 Multiple myeloma not having achieved remission: Secondary | ICD-10-CM | POA: Diagnosis not present

## 2019-04-03 DIAGNOSIS — Z5112 Encounter for antineoplastic immunotherapy: Secondary | ICD-10-CM | POA: Diagnosis not present

## 2019-04-03 DIAGNOSIS — C9001 Multiple myeloma in remission: Secondary | ICD-10-CM | POA: Diagnosis not present

## 2019-04-03 LAB — CBC WITH DIFFERENTIAL/PLATELET
Abs Immature Granulocytes: 0.27 10*3/uL — ABNORMAL HIGH (ref 0.00–0.07)
Basophils Absolute: 0 10*3/uL (ref 0.0–0.1)
Basophils Relative: 1 %
Eosinophils Absolute: 0 10*3/uL (ref 0.0–0.5)
Eosinophils Relative: 1 %
HCT: 30.9 % — ABNORMAL LOW (ref 36.0–46.0)
Hemoglobin: 10 g/dL — ABNORMAL LOW (ref 12.0–15.0)
Immature Granulocytes: 10 %
Lymphocytes Relative: 24 %
Lymphs Abs: 0.7 10*3/uL (ref 0.7–4.0)
MCH: 32.7 pg (ref 26.0–34.0)
MCHC: 32.4 g/dL (ref 30.0–36.0)
MCV: 101 fL — ABNORMAL HIGH (ref 80.0–100.0)
Monocytes Absolute: 0.2 10*3/uL (ref 0.1–1.0)
Monocytes Relative: 8 %
Neutro Abs: 1.6 10*3/uL — ABNORMAL LOW (ref 1.7–7.7)
Neutrophils Relative %: 56 %
Platelets: 129 10*3/uL — ABNORMAL LOW (ref 150–400)
RBC: 3.06 MIL/uL — ABNORMAL LOW (ref 3.87–5.11)
RDW: 13.7 % (ref 11.5–15.5)
WBC: 2.8 10*3/uL — ABNORMAL LOW (ref 4.0–10.5)
nRBC: 1.4 % — ABNORMAL HIGH (ref 0.0–0.2)

## 2019-04-03 LAB — COMPREHENSIVE METABOLIC PANEL
ALT: 64 U/L — ABNORMAL HIGH (ref 0–44)
AST: 62 U/L — ABNORMAL HIGH (ref 15–41)
Albumin: 3.2 g/dL — ABNORMAL LOW (ref 3.5–5.0)
Alkaline Phosphatase: 65 U/L (ref 38–126)
Anion gap: 13 (ref 5–15)
BUN: 21 mg/dL (ref 8–23)
CO2: 23 mmol/L (ref 22–32)
Calcium: 9.2 mg/dL (ref 8.9–10.3)
Chloride: 101 mmol/L (ref 98–111)
Creatinine, Ser: 1 mg/dL (ref 0.44–1.00)
GFR calc Af Amer: >60 mL/min (ref >60)
GFR calc non Af Amer: >60 mL/min (ref >60)
Glucose, Bld: 105 mg/dL — ABNORMAL HIGH (ref 70–99)
Potassium: 3.5 mmol/L (ref 3.5–5.1)
Sodium: 137 mmol/L (ref 135–145)
Total Bilirubin: 0.6 mg/dL (ref 0.3–1.2)
Total Protein: 8.9 g/dL — ABNORMAL HIGH (ref 6.5–8.1)

## 2019-04-03 LAB — LACTATE DEHYDROGENASE: LDH: 948 U/L — ABNORMAL HIGH (ref 98–192)

## 2019-04-03 MED ORDER — SONAFINE EX EMUL
1.0000 "application " | Freq: Once | CUTANEOUS | Status: AC
  Administered 2019-04-03: 1 via TOPICAL

## 2019-04-04 ENCOUNTER — Encounter (HOSPITAL_COMMUNITY): Payer: Self-pay | Admitting: Hematology

## 2019-04-04 ENCOUNTER — Inpatient Hospital Stay (HOSPITAL_COMMUNITY): Payer: 59

## 2019-04-04 ENCOUNTER — Inpatient Hospital Stay (HOSPITAL_COMMUNITY): Payer: 59 | Admitting: General Practice

## 2019-04-04 ENCOUNTER — Ambulatory Visit
Admission: RE | Admit: 2019-04-04 | Discharge: 2019-04-04 | Disposition: A | Discharge status: Home / Self Care | Payer: 59 | Source: Ambulatory Visit | Attending: Radiation Oncology | Admitting: Radiation Oncology

## 2019-04-04 ENCOUNTER — Inpatient Hospital Stay (HOSPITAL_BASED_OUTPATIENT_CLINIC_OR_DEPARTMENT_OTHER): Payer: 59 | Admitting: Hematology

## 2019-04-04 ENCOUNTER — Other Ambulatory Visit: Payer: Self-pay

## 2019-04-04 VITALS — BP 125/61 | HR 98 | Temp 96.8°F | Resp 18

## 2019-04-04 VITALS — BP 140/65 | HR 102 | Temp 97.3°F | Resp 16 | Wt 135.4 lb

## 2019-04-04 DIAGNOSIS — C9002 Multiple myeloma in relapse: Secondary | ICD-10-CM

## 2019-04-04 DIAGNOSIS — R197 Diarrhea, unspecified: Secondary | ICD-10-CM | POA: Diagnosis not present

## 2019-04-04 DIAGNOSIS — C9000 Multiple myeloma not having achieved remission: Secondary | ICD-10-CM

## 2019-04-04 DIAGNOSIS — C9001 Multiple myeloma in remission: Secondary | ICD-10-CM | POA: Diagnosis not present

## 2019-04-04 DIAGNOSIS — Z5112 Encounter for antineoplastic immunotherapy: Secondary | ICD-10-CM | POA: Diagnosis not present

## 2019-04-04 LAB — PROTEIN ELECTROPHORESIS, SERUM
A/G Ratio: 0.6 — ABNORMAL LOW (ref 0.7–1.7)
Albumin ELP: 3.5 g/dL (ref 2.9–4.4)
Alpha-1-Globulin: 0.2 g/dL (ref 0.0–0.4)
Alpha-2-Globulin: 0.9 g/dL (ref 0.4–1.0)
Beta Globulin: 1 g/dL (ref 0.7–1.3)
Gamma Globulin: 3.3 g/dL — ABNORMAL HIGH (ref 0.4–1.8)
Globulin, Total: 5.4 g/dL — ABNORMAL HIGH (ref 2.2–3.9)
M-Spike, %: 2.3 g/dL — ABNORMAL HIGH
Total Protein ELP: 8.9 g/dL — ABNORMAL HIGH (ref 6.0–8.5)

## 2019-04-04 LAB — KAPPA/LAMBDA LIGHT CHAINS
Kappa free light chain: 194.8 mg/L — ABNORMAL HIGH (ref 3.3–19.4)
Kappa, lambda light chain ratio: 18.91 — ABNORMAL HIGH (ref 0.26–1.65)
Lambda free light chains: 10.3 mg/L (ref 5.7–26.3)

## 2019-04-04 MED ORDER — SODIUM CHLORIDE 0.9 % IV SOLN
40.0000 mg | Freq: Once | INTRAVENOUS | Status: AC
  Administered 2019-04-04: 40 mg via INTRAVENOUS
  Filled 2019-04-04: qty 4

## 2019-04-04 MED ORDER — SODIUM CHLORIDE 0.9 % IV SOLN
Freq: Once | INTRAVENOUS | Status: AC
  Administered 2019-04-04: 10:00:00 via INTRAVENOUS

## 2019-04-04 MED ORDER — DEXTROSE 5 % IV SOLN
19.0000 mg/m2 | Freq: Once | INTRAVENOUS | Status: AC
  Administered 2019-04-04: 30 mg via INTRAVENOUS
  Filled 2019-04-04: qty 15

## 2019-04-04 MED ORDER — PROCHLORPERAZINE MALEATE 10 MG PO TABS: 10.0000 mg | ORAL_TABLET | Freq: Four times a day (QID) | ORAL | 1 refills | Status: AC | PRN

## 2019-04-04 NOTE — Progress Notes (Signed)
04/04/19  Received ok to proceed without insurance prior auth completion.  T.OJuliann Pulse Smith/Pablo Mathurin Ronnald Ramp, PharmD

## 2019-04-04 NOTE — Patient Instructions (Signed)
[place] at [facility] Discharge Instructions  You were seen today by Dr. Delton Coombes. He went over your recent lab results. Labs and treatment tomorrow. He will see you back in 1 week for labs, treatment  and follow up.   Thank you for choosing [place] at [facility] to provide your oncology and hematology care.  To afford each patient quality time with our provider, please arrive at least 15 minutes before your scheduled appointment time.   If you have a lab appointment with the [facility] please come in thru the  Main Entrance and check in at the main information desk  You need to re-schedule your appointment should you arrive 10 or more minutes late.  We strive to give you quality time with our providers, and arriving late affects you and other patients whose appointments are after yours.  Also, if you no show three or more times for appointments you may be dismissed from the clinic at the providers discretion.     Again, thank you for choosing [facility].  Our hope is that these requests will decrease the amount of time that you wait before being seen by our physicians.       _____________________________________________________________  Should you have questions after your visit to [facility], please contact our office at [phone] between the hours of 8:00 a.m. and 4:30 p.m.  Voicemails left after 4:00 p.m. will not be returned until the following business day.  For prescription refill requests, have your pharmacy contact our office and allow 72 hours.    Cancer Center Support Programs:   > Cancer Support Group  2nd Tuesday of the month 1pm-2pm, Journey Room

## 2019-04-04 NOTE — Progress Notes (Signed)
Chemotherapy education packet given and discussed with patient. Discussed diagnosis and staging, tx regimen, and intent of tx.  Reviewed chemotherapy medications and side effects, as well as pre-medications.  Instructed on how to manage side effects at home, and when to call the clinic.  Importance of fever/chills discussed with patient. Discussed precautions to implement at home after receiving tx, as well as self care strategies. Phone numbers provided for clinic during regular working hours, also how to reach the clinic after hours and on weekends. Patient provided the opportunity to ask questions - all questions answered to patient's satisfaction. Consent obtained at this time.   Patient was provided with additional education resources on Pomalyst from the company.

## 2019-04-04 NOTE — Progress Notes (Signed)
Melissa [name], [place] [address]   CLINIC:  Medical Oncology/Hematology  PCP:  Kathyrn Drown, MD [address] [place] [address] [phone]   REASON FOR VISIT: Relapsed multiple myeloma  CURRENT THERAPY: Carfilzomib, pomalidomide and dexamethasone   INTERVAL HISTORY:  Melissa [name] 63 y.o. female seen for follow-up of her multiple myeloma.  She is continuing radiation therapy.  She reported new right-sided rib pain.  This started about 2 days ago.  Left shoulder pain has improved.  She is taking Aleve 2 tablets in the morning.  She is not requiring Percocet on a daily basis.  Appetite and energy levels are 50%.  She reported some diarrhea on and off since she got zoledronic acid.  She felt better after she was treated with dexamethasone on Friday.  REVIEW OF SYSTEMS:  Review of Systems  Musculoskeletal:       Left shoulder pain reported.  Neurological: Positive for numbness (In the chin and left forearm.).  All other systems reviewed and are negative.    PAST MEDICAL/SURGICAL HISTORY:  Past Medical History:  Diagnosis Date  . History of stem cell transplant ([facility])    for multiple myeloma  . Hypothyroidism   . Multiple myeloma   . Peripheral neuropathy   . Seasonal allergies   . Thyroid disease    Past Surgical History:  Procedure Laterality Date  . CATARACT EXTRACTION W/PHACO Right 04/04/2013   Procedure: CATARACT EXTRACTION PHACO AND INTRAOCULAR LENS PLACEMENT (IOC);  Surgeon: Elta Guadeloupe T. Gershon Crane, MD;  Location: [facility];  Service: Ophthalmology;  Laterality: Right;  CDE:3.42  . CATARACT EXTRACTION W/PHACO Left 04/18/2013   Procedure: CATARACT EXTRACTION PHACO AND INTRAOCULAR LENS PLACEMENT (IOC);  Surgeon: Elta Guadeloupe T. Gershon Crane, MD;  Location: [facility];  Service: Ophthalmology;  Laterality: Left;  CDE:4.91  . CESAREAN SECTION  06/1993  . FASCIECTOMY Left 03/02/2017   Procedure: FASCIECTOMY, LEFT SMALL FINGER;  Surgeon: Daryll Brod,  MD;  Location: [facility];  Service: Orthopedics;  Laterality: Left;  . HAND SURGERY Left   . TONSILECTOMY, ADENOIDECTOMY, BILATERAL MYRINGOTOMY AND TUBES    . TONSILLECTOMY    . TUBAL LIGATION    . YAG LASER APPLICATION Right 10/01/3708   Procedure: YAG LASER APPLICATION;  Surgeon: Rutherford Guys, MD;  Location: [facility];  Service: Ophthalmology;  Laterality: Right;  . YAG LASER APPLICATION Left 10/21/9483   Procedure: YAG LASER APPLICATION;  Surgeon: Rutherford Guys, MD;  Location: [facility];  Service: Ophthalmology;  Laterality: Left;     SOCIAL HISTORY:  Social History   Socioeconomic History  . Marital status: Married    Spouse name: Not on file  . Number of children: Not on file  . Years of education: Not on file  . Highest education level: Not on file  Occupational History  . Not on file  Social Needs  . Financial resource strain: Not on file  . Food insecurity    Worry: Not on file    Inability: Not on file  . Transportation needs    Medical: Not on file    Non-medical: Not on file  Tobacco Use  . Smoking status: Never Smoker  . Smokeless tobacco: Never Used  Substance and Sexual Activity  . Alcohol use: Yes    Comment: occ  . Drug use: No  . Sexual activity: Not Currently    Birth control/protection: Post-menopausal  Lifestyle  . Physical activity    Days per week: Not on  file    Minutes per session: Not on file  . Stress: Not on file  Relationships  . Social Herbalist on phone: Not on file    Gets together: Not on file    Attends religious service: Not on file    Active member of club or organization: Not on file    Attends meetings of clubs or organizations: Not on file    Relationship status: Not on file  . Intimate partner violence    Fear of current or ex partner: Not on file    Emotionally abused: Not on file    Physically abused: Not on file    Forced sexual activity: Not on file  Other Topics Concern  . Not on file  Social  History Narrative  . Not on file    FAMILY HISTORY:  Family History  Problem Relation Age of Onset  . Hypertension Mother   . Heart attack Mother   . Diabetes Mother   . Skin cancer Mother   . Hypertension Father   . Heart attack Father   . Skin cancer Father   . Heart disease Maternal Grandmother   . Stroke Maternal Grandfather   . Hypertension Sister   . Skin cancer Sister   . Hypertension Brother   . Skin cancer Brother   . Hypertension Brother   . Cancer Brother        melanoma  . Skin cancer Brother   . Factor V Leiden deficiency Daughter     CURRENT MEDICATIONS:  Outpatient Encounter Medications as of 04/04/2019  Medication Sig  . Calcium Carbonate-Vitamin D (CALCIUM + D PO) Take 1 tablet by mouth daily.  . Cholecalciferol (VITAMIN D) 2000 UNITS tablet Take 2,000 Units by mouth daily.  Marland Kitchen ibuprofen (ADVIL,MOTRIN) 200 MG tablet Take 200 mg by mouth as needed.  Marland Kitchen levothyroxine (SYNTHROID, LEVOTHROID) 75 MCG tablet Take 1 tablet (75 mcg total) by mouth daily.  Marland Kitchen oxyCODONE-acetaminophen (PERCOCET) 10-325 MG tablet Take 1 tablet by mouth every 8 (eight) hours as needed for pain. (Patient not taking: Reported on 03/29/2019)  . pomalidomide (POMALYST) 4 MG capsule Take 1 capsule (4 mg total) by mouth daily.  Marland Kitchen triamcinolone cream (KENALOG) 0.1 % Apply BID to affected areas (Patient not taking: Reported on 03/29/2019)   Facility-Administered Encounter Medications as of 04/04/2019  Medication  . 0.9 %  sodium chloride infusion  . 0.9 %  sodium chloride infusion  . sodium chloride 0.9 % injection 10 mL    ALLERGIES:  Allergies  Allergen Reactions  . Hydromorphone Hcl Nausea And Vomiting  . Poison Ivy Extract [Poison Ivy Extract]   . Tape Rash     PHYSICAL EXAM:  ECOG Performance status: 1  Vitals:   04/04/19 0833  BP: 140/65  Pulse: (!) 102  Resp: 16  Temp: (!) 97.3 F (36.3 C)  SpO2: 100%   Filed Weights   04/04/19 0833  Weight: 135 lb 6.4 oz (61.4 kg)     Physical Exam Constitutional:      Appearance: She is well-developed.  Neck:     Musculoskeletal: Normal range of motion and neck supple.  Cardiovascular:     Rate and Rhythm: Normal rate and regular rhythm.     Heart sounds: Normal heart sounds.  Pulmonary:     Effort: Pulmonary effort is normal.     Breath sounds: Normal breath sounds.  Abdominal:     General: There is no distension.  Palpations: Abdomen is soft. There is no mass.  Musculoskeletal: Normal range of motion.  Skin:    General: Skin is warm.  Neurological:     General: No focal deficit present.     Mental Status: She is alert and oriented to person, place, and time.  Psychiatric:        Mood and Affect: Mood normal.        Behavior: Behavior normal.     LABORATORY DATA:  I have reviewed the labs as listed.  CBC    Component Value Date/Time   WBC 2.8 (L) 04/03/2019 0912   RBC 3.06 (L) 04/03/2019 0912   HGB 10.0 (L) 04/03/2019 0912   HCT 30.9 (L) 04/03/2019 0912   PLT 129 (L) 04/03/2019 0912   MCV 101.0 (H) 04/03/2019 0912   MCH 32.7 04/03/2019 0912   MCHC 32.4 04/03/2019 0912   RDW 13.7 04/03/2019 0912   LYMPHSABS 0.7 04/03/2019 0912   MONOABS 0.2 04/03/2019 0912   EOSABS 0.0 04/03/2019 0912   BASOSABS 0.0 04/03/2019 0912   CMP Latest Ref Rng & Units 04/03/2019 03/31/2019 03/30/2019  Glucose 70 - 99 mg/dL 105(H) 116(H) 111(H)  BUN 8 - 23 mg/dL 21 16 20   Creatinine 0.44 - 1.00 mg/dL 1.00 0.96 1.03(H)  Sodium 135 - 145 mmol/L 137 138 139  Potassium 3.5 - 5.1 mmol/L 3.5 3.8 4.2  Chloride 98 - 111 mmol/L 101 101 100  CO2 22 - 32 mmol/L 23 25 26   Calcium 8.9 - 10.3 mg/dL 9.2 9.0 9.7  Total Protein 6.5 - 8.1 g/dL 8.9(H) 9.0(H) -  Total Bilirubin 0.3 - 1.2 mg/dL 0.6 0.6 -  Alkaline Phos 38 - 126 U/L 65 65 -  AST 15 - 41 U/L 62(H) 58(H) -  ALT 0 - 44 U/L 64(H) 40 -    I have personally reviewed her scans.  ASSESSMENT & PLAN:   Multiple myeloma in relapse ([facility]) 1.  Relapsed IgA kappa multiple  myeloma: -Initially treated with Velcade and dexamethasone followed by stem cell transplant on 03/06/2009. -Revlimid maintenance 5 mg 1 week on 1 week off, discontinued in October 2014 secondary to intolerance from low blood counts.  Zometa was discontinued in August 2014. -Bone marrow biopsy on 04/07/2017 at [facility] negative for any residual myeloma. -Most recent surveillance labs on 02/08/2019 showed resurgence of M spike 0.4 g/dl and elevated free light chain ratio 5.5 with elevated kappa light chains of 94. -Her labs at [facility] on 03/13/2019 showed M spike of 1.9 g/dL and light chain ratio of 12.77 with kappa light chains of 151. -Bone marrow biopsy on 03/14/2019 showed 50% plasma cells.  Cytogenetics showed 3, XX.  FISH panel is pending. -PET CT scan on 03/10/2019 showed extensive bone involvement in the spine, sternum, bilateral ribs, clavicles, shoulders, pelvic girdle. -Myeloma labs on 03/29/2019 show kappa light chains of 243, lambda light chain 12.9 with ratio of 18.84.  M spike showed 2.9 g of monoclonal protein. -She has developed new right lateral rib pain.  LDH has gone up to 948 today. -She has developed mild cytopenias likely from radiation field.  She also developed some diarrhea which is also coming from radiation to the hip area. -FISH panel is still pending as of now.  We talked about initiating her treatment today.  LDH is going up rapidly indicating aggressive nature of her myeloma.  Hence I have recommended carfilzomib, pomalidomide and dexamethasone. -Carfilzomib will be given on days 1, 2, 8, 9,  and 15, 16 every 28 days.  She will likely receive pomalidomide in the next day or 2 and start it.  She will receive dexamethasone 40 mg on day 1, 8 and 15 of carfilzomib. -We will send her prescription for dexamethasone to take it on her off week. -We reviewed schedule and side effects of this regimen in detail.  Plan is to give her 4-6 cycles and lining  up for bone marrow transplant. -We will see her back in 1 week for follow-up.  2.  Left shoulder pain: -Hypermetabolic lesion in the left distal clavicle and scapula. -She started XRT on 03/27/2019.  Pain has improved. -She is taking Aleve and rarely needs Percocet 10/325.  3.  New onset numbness: -Numbness in the chin area has decreased in the size. -Numbness in the left forearm has improved. -MRI of the cervical and thoracic spine on 03/21/2019 shows left dorsal and lateral epidural soft tissue present at the level of T1 and some extension to the C7 level superiorly.  There is involvement of the left C7-T1 neuroforamina.  No significant canal compromise. -These lesions are included in the radiation field.  4.  Hypercalcemia: -She developed hypercalcemia which was treated with Zometa on 03/29/2019. -Today her calcium is normal.      Orders placed this encounter:  Orders Placed This Encounter  Procedures  . Magnesium  . Phosphorus  . Uric acid  . Comprehensive metabolic panel      Derek Jack, MD [place] [phone]

## 2019-04-04 NOTE — Progress Notes (Signed)
Labs reviewed with MD today. Proceed with treatment per MD.   Treatment given per orders. Patient tolerated it well without problems. Vitals stable and discharged home from clinic ambulatory. Follow up as scheduled.  

## 2019-04-04 NOTE — Progress Notes (Signed)
[name] Initial Psychosocial Assessment Clinical Social Work  Clinical Social Work contacted by phone to assess psychosocial, emotional, mental health, and spiritual needs of the patient.   Barriers to care/review of distress screen:  - Transportation:  Do you anticipate any problems getting to appointments?  Do you have someone who can help run errands for you if you need it?  No concerns - husband helps w any needed transport - Help at home:  What is your living situation (alone, family, other)?  If you are physically unable to care for yourself, who would you call on to help you?  Lives w husband, daughter nearby.  Supportive friends. - Support system:  What does your support system look like?  Who would you call on if you needed some kind of practical help?  What if you needed someone to talk to for emotional support?  Finds support from husband, daughter, friends.  They each provide both emotional and practical support.  Likes to keep herself active and engaged in the world.  Pursues activities w family and friends.  - Finances:  Are you concerned about finances .  Considering returning to work?  If not, applying for disability?  No financial concerns at this time.  She wants to return to work when possible, has been working in [employer] treasure[employer] for many years and finds satisfaction in her job.  Husband retired.    What is your understanding of where you are with your cancer? Its cause?  Your treatment plan and what happens next? 10 years post stem cell transplant for multiple myeloma.  Was on Revlimid maintenance, discontinued in 2014 due to side effects.  Aware she had aggressive cancer; however, having passed the 10 year mark, was hoping that cancer was in the past.  Learned about possible reoccurrence by MyChart notification re blood work abnormalities.  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  Is now beginning course of palliative radiation for pain control  and chemotherapy in preparation of stem cell transplant when possible.  Was treated at [facility] in past, will return there.    What are your worries for the future as you begin treatment for cancer?  Is familiar with process of chemotherapy and stem cell transplant as she has had these before.  Has new oncologists at both [facility] and [facility] - was comfortable and familiar with her previous treatment team, most of whom have retired or relocated.    What are your hopes and priorities during your treatment? What is important to you? What are your goals for your care?  Honest conversations with treatment providers are very important to patient.  She is a Buyer, retail herself, and able to read/understand most of what is contained in her chart.  She wants to be informed as much as possible by her treatment providers.  She wants to be included in all discussions/conversations.   What are you willing to sacrifice during your treatment? She has made many changes in lifestyle through the years, both as a result of aging and as a result of diagnosis/treatment for multiple myeloma.  She is good at finding substitutes for activities, has modified what she does to match what her body is capable of.  This has led to finding new activities and interests, focusing on growth and learning.  What are you NOT willing to sacrifice during your treatment? Honesty from her treatment team.  She likes to be active as much as possible but will  modify activities as needed.    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  Facing reoccurrence of multiple myeloma after 10 years.  Now undergoing chemotherapy and radiation in preparation for a hoped for stem cell transplant in future.  Has great support from family and friends.  Has maintained active lifestyle and  many/varied interests.  Is active and engaged in her treatment - wants to be kept informed and not shielded from information.    Identifications of barriers to care:  None noted  Availability of community resources:  None needed  Clinical Social Worker follow up needed: No.   Melissa [name], [place] Worker Phone:  [phone] Cell:  [phone]

## 2019-04-04 NOTE — Assessment & Plan Note (Signed)
1.  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 -Revlimid maintenance 5 mg 1 week on 1 week off, discontinued in October 2014 secondary to intolerance from low blood counts.  Zometa was discontinued in August 2014. -Bone marrow biopsy on 04/07/2017 at [facility] negative for any residual myeloma. -Most recent surveillance labs on 02/08/2019 showed resurgence of M spike 0.4 g/dl and elevated free light chain ratio 5.5 with elevated kappa light chains of 94. -Her labs at [facility] on 03/13/2019 showed M spike of 1.9 g/dL and light chain ratio of 12.77 with kappa light chains of 151. -Bone marrow biopsy on 03/14/2019 showed 50% plasma cells.  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  FISH panel is pending. -PET CT scan on 03/10/2019 showed extensive bone involvement in the spine, sternum, bilateral ribs, clavicles, shoulders, pelvic girdle. -Myeloma labs on 03/29/2019 show kappa light chains of 243, lambda light chain 12.9 with ratio of 18.84.  M spike showed 2.9 g of monoclonal protein. -She has developed new right lateral rib pain.  LDH has gone up to 948 today. -She has developed mild cytopenias likely from radiation field.  She also developed some diarrhea which is also coming from radiation to the hip area. -FISH panel is still pending as of now.  We talked about initiating her treatment today.  LDH is going up rapidly indicating aggressive nature of her myeloma.  Hence I have recommended carfilzomib, pomalidomide and dexamethasone. -Carfilzomib will be given on days 1, 2, 8, 9, and 15, 16 every 28 days.  She will likely receive pomalidomide in the next day or 2 and start it.  She will receive dexamethasone 40 mg on day 1, 8 and 15 of carfilzomib. -We will send her prescription for dexamethasone to take it on her off week. -We reviewed schedule and side effects of this regimen in detail.  Plan is to  give her 4-6 cycles and lining up for bone marrow transplant. -We will see her back in 1 week for follow-up.  2.  Left shoulder pain: -Hypermetabolic lesion in the left distal clavicle and scapula. -She started XRT on 03/27/2019.  Pain has improved. -She is taking Aleve and rarely needs Percocet 10/325.  3.  New onset numbness: -Numbness in the chin area has decreased in the size. -Numbness in the left forearm has improved. -MRI of the cervical and thoracic spine on 03/21/2019 shows left dorsal and lateral epidural soft tissue present at the level of T1 and some extension to the C7 level superiorly.  There is involvement of the left C7-T1 neuroforamina.  No significant canal compromise. -These lesions are included in the radiation field.  4.  Hypercalcemia: -She developed hypercalcemia which was treated with Zometa on 03/29/2019. -Today her calcium is normal.

## 2019-04-05 ENCOUNTER — Inpatient Hospital Stay (HOSPITAL_COMMUNITY): Payer: 59

## 2019-04-05 ENCOUNTER — Other Ambulatory Visit (HOSPITAL_COMMUNITY): Payer: Self-pay | Admitting: *Deleted

## 2019-04-05 ENCOUNTER — Ambulatory Visit
Admission: RE | Admit: 2019-04-05 | Discharge: 2019-04-05 | Disposition: A | Discharge status: Home / Self Care | Payer: 59 | Source: Ambulatory Visit | Attending: Radiation Oncology | Admitting: Radiation Oncology

## 2019-04-05 ENCOUNTER — Encounter (HOSPITAL_COMMUNITY): Payer: Self-pay | Admitting: Hematology

## 2019-04-05 ENCOUNTER — Other Ambulatory Visit: Payer: Self-pay

## 2019-04-05 VITALS — BP 122/63 | HR 102 | Temp 97.6°F | Resp 18

## 2019-04-05 DIAGNOSIS — C9000 Multiple myeloma not having achieved remission: Secondary | ICD-10-CM | POA: Diagnosis not present

## 2019-04-05 DIAGNOSIS — Z5112 Encounter for antineoplastic immunotherapy: Secondary | ICD-10-CM | POA: Diagnosis not present

## 2019-04-05 DIAGNOSIS — C9002 Multiple myeloma in relapse: Secondary | ICD-10-CM

## 2019-04-05 DIAGNOSIS — R197 Diarrhea, unspecified: Secondary | ICD-10-CM | POA: Diagnosis not present

## 2019-04-05 DIAGNOSIS — C9001 Multiple myeloma in remission: Secondary | ICD-10-CM | POA: Diagnosis not present

## 2019-04-05 LAB — MAGNESIUM: Magnesium: 2.5 mg/dL — ABNORMAL HIGH (ref 1.7–2.4)

## 2019-04-05 LAB — COMPREHENSIVE METABOLIC PANEL
ALT: 56 U/L — ABNORMAL HIGH (ref 0–44)
AST: 47 U/L — ABNORMAL HIGH (ref 15–41)
Albumin: 3.1 g/dL — ABNORMAL LOW (ref 3.5–5.0)
Alkaline Phosphatase: 63 U/L (ref 38–126)
Anion gap: 15 (ref 5–15)
BUN: 21 mg/dL (ref 8–23)
CO2: 22 mmol/L (ref 22–32)
Calcium: 9.7 mg/dL (ref 8.9–10.3)
Chloride: 100 mmol/L (ref 98–111)
Creatinine, Ser: 0.91 mg/dL (ref 0.44–1.00)
GFR calc Af Amer: >60 mL/min (ref >60)
GFR calc non Af Amer: >60 mL/min (ref >60)
Glucose, Bld: 138 mg/dL — ABNORMAL HIGH (ref 70–99)
Potassium: 4.1 mmol/L (ref 3.5–5.1)
Sodium: 137 mmol/L (ref 135–145)
Total Bilirubin: 0.7 mg/dL (ref 0.3–1.2)
Total Protein: 8.8 g/dL — ABNORMAL HIGH (ref 6.5–8.1)

## 2019-04-05 LAB — IMMUNOFIXATION ELECTROPHORESIS
IgA: 2727 mg/dL — ABNORMAL HIGH (ref 87–352)
IgG (Immunoglobin G), Serum: 744 mg/dL (ref 586–1602)
IgM (Immunoglobulin M), Srm: 31 mg/dL (ref 26–217)
Total Protein ELP: 8.9 g/dL — ABNORMAL HIGH (ref 6.0–8.5)

## 2019-04-05 LAB — PHOSPHORUS: Phosphorus: 3.7 mg/dL (ref 2.5–4.6)

## 2019-04-05 LAB — URIC ACID: Uric Acid, Serum: 6.1 mg/dL (ref 2.5–7.1)

## 2019-04-05 MED ORDER — POMALIDOMIDE 4 MG PO CAPS: 4.0000 mg | ORAL_CAPSULE | Freq: Every day | ORAL | 0 refills | Status: DC

## 2019-04-05 MED ORDER — DEXTROSE 5 % IV SOLN
19.0000 mg/m2 | Freq: Once | INTRAVENOUS | Status: AC
  Administered 2019-04-05: 30 mg via INTRAVENOUS
  Filled 2019-04-05: qty 15

## 2019-04-05 MED ORDER — HEPARIN SOD (PORK) LOCK FLUSH 100 UNIT/ML IV SOLN: 500.0000 [IU] | Freq: Once | INTRAVENOUS | Status: DC | PRN

## 2019-04-05 MED ORDER — SODIUM CHLORIDE 0.9 % IV SOLN
Freq: Once | INTRAVENOUS | Status: AC
  Administered 2019-04-05: 13:00:00 via INTRAVENOUS

## 2019-04-05 MED ORDER — PROCHLORPERAZINE MALEATE 10 MG PO TABS
10.0000 mg | ORAL_TABLET | Freq: Once | ORAL | Status: AC
  Administered 2019-04-05: 10 mg via ORAL

## 2019-04-05 MED ORDER — SODIUM CHLORIDE 0.9% FLUSH: 10.0000 mL | INTRAVENOUS | Status: DC | PRN

## 2019-04-05 MED ORDER — ACYCLOVIR 400 MG PO TABS: 400.0000 mg | ORAL_TABLET | Freq: Two times a day (BID) | ORAL | 3 refills | Status: DC

## 2019-04-05 MED ORDER — PROCHLORPERAZINE MALEATE 10 MG PO TABS
ORAL_TABLET | ORAL | Status: AC
  Filled 2019-04-05: qty 1

## 2019-04-05 NOTE — Progress Notes (Signed)
Patient her for day 2 of treatment today. HR is 100. Proceed as planned.   See flow sheet regarding Peripheral IV.   Peripheral iv checked for blood return by two nurse prior to hanging chemotherapy.   Treatment given per orders. Patient tolerated it well without problems. Vitals stable and discharged home from clinic ambulatory. Follow up as scheduled.

## 2019-04-06 ENCOUNTER — Other Ambulatory Visit: Payer: Self-pay

## 2019-04-06 ENCOUNTER — Ambulatory Visit
Admission: RE | Admit: 2019-04-06 | Discharge: 2019-04-06 | Disposition: A | Discharge status: Home / Self Care | Payer: 59 | Source: Ambulatory Visit | Attending: Radiation Oncology | Admitting: Radiation Oncology

## 2019-04-06 ENCOUNTER — Encounter (HOSPITAL_COMMUNITY): Payer: Self-pay | Admitting: *Deleted

## 2019-04-06 DIAGNOSIS — Z5112 Encounter for antineoplastic immunotherapy: Secondary | ICD-10-CM | POA: Diagnosis not present

## 2019-04-06 DIAGNOSIS — C9000 Multiple myeloma not having achieved remission: Secondary | ICD-10-CM | POA: Diagnosis not present

## 2019-04-06 DIAGNOSIS — R197 Diarrhea, unspecified: Secondary | ICD-10-CM | POA: Diagnosis not present

## 2019-04-06 DIAGNOSIS — C9001 Multiple myeloma in remission: Secondary | ICD-10-CM | POA: Diagnosis not present

## 2019-04-06 NOTE — Progress Notes (Signed)
24 hour call back documentation:    Patient called into clinic this morning reporting a temp of 100.1 and chills.  No other symptoms.  She reports doing well since treatment except the above.  I advised her that this can be a side effect of the treatment.  She was advised to take tylenol, drink fluids, and rest.  She was advised to call back in a few hours and let us know how she is doing.  She verbalizes understanding.

## 2019-04-07 ENCOUNTER — Ambulatory Visit
Admission: RE | Admit: 2019-04-07 | Discharge: 2019-04-07 | Disposition: A | Discharge status: Home / Self Care | Payer: 59 | Source: Ambulatory Visit | Attending: Radiation Oncology | Admitting: Radiation Oncology

## 2019-04-07 ENCOUNTER — Encounter: Payer: Self-pay | Admitting: Radiation Oncology

## 2019-04-07 ENCOUNTER — Other Ambulatory Visit: Payer: Self-pay

## 2019-04-07 DIAGNOSIS — C9000 Multiple myeloma not having achieved remission: Secondary | ICD-10-CM | POA: Diagnosis not present

## 2019-04-07 DIAGNOSIS — Z5112 Encounter for antineoplastic immunotherapy: Secondary | ICD-10-CM | POA: Diagnosis not present

## 2019-04-07 DIAGNOSIS — R197 Diarrhea, unspecified: Secondary | ICD-10-CM | POA: Diagnosis not present

## 2019-04-07 DIAGNOSIS — C9001 Multiple myeloma in remission: Secondary | ICD-10-CM | POA: Diagnosis not present

## 2019-04-10 ENCOUNTER — Other Ambulatory Visit: Payer: Self-pay

## 2019-04-10 LAB — SURGICAL PATHOLOGY

## 2019-04-11 ENCOUNTER — Encounter (HOSPITAL_COMMUNITY): Payer: Self-pay | Admitting: Hematology

## 2019-04-11 ENCOUNTER — Inpatient Hospital Stay (HOSPITAL_BASED_OUTPATIENT_CLINIC_OR_DEPARTMENT_OTHER): Payer: 59 | Admitting: Hematology

## 2019-04-11 ENCOUNTER — Inpatient Hospital Stay (HOSPITAL_COMMUNITY): Payer: 59

## 2019-04-11 VITALS — BP 114/64 | HR 91 | Temp 96.3°F | Resp 16

## 2019-04-11 VITALS — BP 115/46 | HR 101 | Temp 97.1°F | Resp 14 | Wt 133.4 lb

## 2019-04-11 DIAGNOSIS — R197 Diarrhea, unspecified: Secondary | ICD-10-CM | POA: Diagnosis not present

## 2019-04-11 DIAGNOSIS — Z5112 Encounter for antineoplastic immunotherapy: Secondary | ICD-10-CM | POA: Diagnosis not present

## 2019-04-11 DIAGNOSIS — C9002 Multiple myeloma in relapse: Secondary | ICD-10-CM

## 2019-04-11 DIAGNOSIS — C9001 Multiple myeloma in remission: Secondary | ICD-10-CM | POA: Diagnosis not present

## 2019-04-11 LAB — CBC WITH DIFFERENTIAL/PLATELET
Abs Immature Granulocytes: 0.1 10*3/uL — ABNORMAL HIGH (ref 0.00–0.07)
Basophils Absolute: 0 10*3/uL (ref 0.0–0.1)
Basophils Relative: 1 %
Eosinophils Absolute: 0.1 10*3/uL (ref 0.0–0.5)
Eosinophils Relative: 2 %
HCT: 28 % — ABNORMAL LOW (ref 36.0–46.0)
Hemoglobin: 9.1 g/dL — ABNORMAL LOW (ref 12.0–15.0)
Immature Granulocytes: 5 %
Lymphocytes Relative: 19 %
Lymphs Abs: 0.4 10*3/uL — ABNORMAL LOW (ref 0.7–4.0)
MCH: 32.7 pg (ref 26.0–34.0)
MCHC: 32.5 g/dL (ref 30.0–36.0)
MCV: 100.7 fL — ABNORMAL HIGH (ref 80.0–100.0)
Monocytes Absolute: 0.2 10*3/uL (ref 0.1–1.0)
Monocytes Relative: 10 %
Neutro Abs: 1.3 10*3/uL — ABNORMAL LOW (ref 1.7–7.7)
Neutrophils Relative %: 63 %
Platelets: 52 10*3/uL — ABNORMAL LOW (ref 150–400)
RBC: 2.78 MIL/uL — ABNORMAL LOW (ref 3.87–5.11)
RDW: 13.8 % (ref 11.5–15.5)
WBC: 2.1 10*3/uL — ABNORMAL LOW (ref 4.0–10.5)
nRBC: 1.4 % — ABNORMAL HIGH (ref 0.0–0.2)

## 2019-04-11 LAB — COMPREHENSIVE METABOLIC PANEL
ALT: 56 U/L — ABNORMAL HIGH (ref 0–44)
AST: 46 U/L — ABNORMAL HIGH (ref 15–41)
Albumin: 2.9 g/dL — ABNORMAL LOW (ref 3.5–5.0)
Alkaline Phosphatase: 62 U/L (ref 38–126)
Anion gap: 8 (ref 5–15)
BUN: 15 mg/dL (ref 8–23)
CO2: 25 mmol/L (ref 22–32)
Calcium: 8.8 mg/dL — ABNORMAL LOW (ref 8.9–10.3)
Chloride: 103 mmol/L (ref 98–111)
Creatinine, Ser: 0.9 mg/dL (ref 0.44–1.00)
GFR calc Af Amer: >60 mL/min (ref >60)
GFR calc non Af Amer: >60 mL/min (ref >60)
Glucose, Bld: 136 mg/dL — ABNORMAL HIGH (ref 70–99)
Potassium: 4.4 mmol/L (ref 3.5–5.1)
Sodium: 136 mmol/L (ref 135–145)
Total Bilirubin: 0.5 mg/dL (ref 0.3–1.2)
Total Protein: 7.9 g/dL (ref 6.5–8.1)

## 2019-04-11 MED ORDER — MAGIC MOUTHWASH W/LIDOCAINE: 15.0000 mL | Freq: Four times a day (QID) | ORAL | 2 refills | Status: DC

## 2019-04-11 MED ORDER — MAGIC MOUTHWASH W/LIDOCAINE: 15.0000 mL | ORAL | 2 refills | Status: DC | PRN

## 2019-04-11 MED ORDER — SODIUM CHLORIDE 0.9 % IV SOLN: Freq: Once | INTRAVENOUS | Status: AC

## 2019-04-11 MED ORDER — SODIUM CHLORIDE 0.9 % IV SOLN
40.0000 mg | Freq: Once | INTRAVENOUS | Status: AC
  Administered 2019-04-11: 40 mg via INTRAVENOUS
  Filled 2019-04-11: qty 4

## 2019-04-11 MED ORDER — DEXTROSE 5 % IV SOLN
19.0000 mg/m2 | Freq: Once | INTRAVENOUS | Status: AC
  Administered 2019-04-11: 30 mg via INTRAVENOUS
  Filled 2019-04-11: qty 15

## 2019-04-11 MED ORDER — DEXAMETHASONE 4 MG PO TABS: 40.0000 mg | ORAL_TABLET | ORAL | 2 refills | Status: AC

## 2019-04-11 NOTE — Assessment & Plan Note (Signed)
1.  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 -Revlimid maintenance 5 mg 1 week on 1 week off, discontinued in October 2014 secondary to intolerance from low blood counts.  Zometa was discontinued in August 2014. -Bone marrow biopsy on 04/07/2017 at [facility] negative for any residual myeloma. -Most recent surveillance labs on 02/08/2019 showed resurgence of M spike 0.4 g/dl and elevated free light chain ratio 5.5 with elevated kappa light chains of 94. -Her labs at [facility] on 03/13/2019 showed M spike of 1.9 g/dL and light chain ratio of 12.77 with kappa light chains of 151. -Bone marrow biopsy on 03/14/2019 showed 50% plasma cells.  Cytogenetics showed 73, XX.  FISH panel is pending. -PET CT scan on 03/10/2019 showed extensive bone involvement in the spine, sternum, bilateral ribs, clavicles, shoulders, pelvic girdle. -Myeloma labs on 03/29/2019 show kappa light chains of 243, lambda light chain 12.9 with ratio of 18.84.  M spike showed 2.9 g of monoclonal protein. -I discussed the results of FISH panel which showed +1 q. and t (4;14).  Both of these are high risk findings. -Carfilzomib started on 04/04/2019. -She reported slight worsening of neuropathy in the feet since treatment started last week. -She reported sore throat since Sunday.  She is having difficulty swallowing liquids and eating foods.  This is likely from radiation.  We will send in prescription for Magic mouthwash with lidocaine. -We reviewed her blood work.  Platelet count is 52.  White count is 2.1 with ANC of 1300.  She has not even started Pomalyst. -I think she will do poorly with full dose Pomalyst.  She apparently had severe cytopenias with Revlimid in the past.  Hence I will cut back on the dose of Pomalyst to 2 mg. -We will also send a prescription for dexamethasone 40 mg to be taken during her treatment  week off. -We will see her back in 1 week for follow-up.  2.  Left shoulder, right lateral ribs and right hip pain: -Hypermetabolic lesion in the left distal clavicle and scapula. -XRT from 03/27/2019 through 06/07/2018 with improvement in pain. -She is taking hydrocodone as needed.  3.  New onset numbness: -Numbness in the chin area has decreased in the size. -Numbness in the left forearm has improved. -MRI of the cervical and thoracic spine on 03/21/2019 shows left dorsal and lateral epidural soft tissue present at the level of T1 and some extension to the C7 level superiorly.  There is involvement of the left C7-T1 neuroforamina.  No significant canal compromise. -These lesions are included in the radiation field.  4.  Hypercalcemia: -She developed hypercalcemia which was treated with Zometa on 03/29/2019. -Today her calcium is normal.

## 2019-04-11 NOTE — Progress Notes (Signed)
04/11/19  Confirmation of dose:  Maintain dose of Kyprolis at 20 mg/m2 with lab values of ANC 1.3 and Platelets 52K.  Orders modified to reflect above.  T.O. Dr Beckey Downing LPN/Zion Lint Ronnald Ramp, PharmD

## 2019-04-11 NOTE — Patient Instructions (Addendum)
[place] at [facility] Discharge Instructions  You were seen today by Dr. Delton Coombes. He went over your recent lab results. He will send in magic mouth wash with lidocaine in it to help with your sore throat. He will see you back in 1 week for labs and follow up.   Thank you for choosing [place] at [facility] to provide your oncology and hematology care.  To afford each patient quality time with our provider, please arrive at least 15 minutes before your scheduled appointment time.   If you have a lab appointment with the [facility] please come in thru the  Main Entrance and check in at the main information desk  You need to re-schedule your appointment should you arrive 10 or more minutes late.  We strive to give you quality time with our providers, and arriving late affects you and other patients whose appointments are after yours.  Also, if you no show three or more times for appointments you may be dismissed from the clinic at the providers discretion.     Again, thank you for choosing [facility].  Our hope is that these requests will decrease the amount of time that you wait before being seen by our physicians.       _____________________________________________________________  Should you have questions after your visit to [facility], please contact our office at [phone] between the hours of 8:00 a.m. and 4:30 p.m.  Voicemails left after 4:00 p.m. will not be returned until the following business day.  For prescription refill requests, have your pharmacy contact our office and allow 72 hours.    Cancer Center Support Programs:   > Cancer Support Group  2nd Tuesday of the month 1pm-2pm, Journey Room

## 2019-04-11 NOTE — Progress Notes (Signed)
[place], [place] [address]   CLINIC:  Medical Oncology/Hematology  PCP:  Kathyrn Drown, MD [address] [place] [address] [phone]   REASON FOR VISIT: Relapsed multiple myeloma  CURRENT THERAPY: Carfilzomib, pomalidomide and dexamethasone   INTERVAL HISTORY:  [name] 63 y.o. female seen for follow-up of multiple myeloma.  She started developing sore throat starting Sunday.  Radiation therapy was finished last Friday.  She reports pain on swallowing both solids and liquids.  She also has right hip pain and right lateral rib pain.  Shoulder pain has improved.  She is taking narcotic pain medication as needed.  She reported neuropathy in the feet slightly worse since treatment started last week.  She has not received Pomalyst yet.  Numbness in the chin area has also improved.  REVIEW OF SYSTEMS:  Review of Systems  HENT:   Positive for sore throat.   Musculoskeletal:       Right hip pain present.  Neurological: Positive for numbness (In the chin and left forearm.).  All other systems reviewed and are negative.    PAST MEDICAL/SURGICAL HISTORY:  Past Medical History:  Diagnosis Date  . 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 Hypothyroidism   . Multiple myeloma   . Peripheral neuropathy   . Seasonal allergies   . Thyroid disease    Past Surgical History:  Procedure Laterality Date  . CATARACT EXTRACTION W/PHACO Right 04/04/2013   Procedure: CATARACT EXTRACTION PHACO AND INTRAOCULAR LENS PLACEMENT (IOC);  Surgeon: Elta Guadeloupe T. Gershon Crane, MD;  Location: [facility];  Service: Ophthalmology;  Laterality: Right;  CDE:3.42  . CATARACT EXTRACTION W/PHACO Left 04/18/2013   Procedure: CATARACT EXTRACTION PHACO AND INTRAOCULAR LENS PLACEMENT (IOC);  Surgeon: Elta Guadeloupe T. Gershon Crane, MD;  Location: [facility];  Service: Ophthalmology;  Laterality: Left;  CDE:4.91  . CESAREAN SECTION  06/1993  . FASCIECTOMY Left  03/02/2017   Procedure: FASCIECTOMY, LEFT SMALL FINGER;  Surgeon: Daryll Brod, MD;  Location: [facility];  Service: Orthopedics;  Laterality: Left;  . HAND SURGERY Left   . TONSILECTOMY, ADENOIDECTOMY, BILATERAL MYRINGOTOMY AND TUBES    . TONSILLECTOMY    . TUBAL LIGATION    . YAG LASER APPLICATION Right 9/93/7169   Procedure: YAG LASER APPLICATION;  Surgeon: Rutherford Guys, MD;  Location: [facility];  Service: Ophthalmology;  Laterality: Right;  . YAG LASER APPLICATION Left 6/78/9381   Procedure: YAG LASER APPLICATION;  Surgeon: Rutherford Guys, MD;  Location: [facility];  Service: Ophthalmology;  Laterality: Left;     SOCIAL HISTORY:  Social History   Socioeconomic History  . Marital status: Married    Spouse name: Not on file  . Number of children: Not on file  . Years of education: Not on file  . Highest education level: Not on file  Occupational History  . Not on file  Tobacco Use  . Smoking status: Never Smoker  . Smokeless tobacco: Never Used  Substance and Sexual Activity  . Alcohol use: Yes    Comment: occ  . Drug use: No  . Sexual activity: Not Currently    Birth control/protection: Post-menopausal  Other Topics Concern  . Not on file  Social History Narrative  . Not on file   Social Determinants of Health   Financial Resource Strain:   . Difficulty of Paying Living Expenses: Not on file  Food Insecurity:   . Worried About Estate manager/land agent  of Food in the Last Year: Not on file  . Ran Out of Food in the Last Year: Not on file  Transportation Needs:   . Lack of Transportation (Medical): Not on file  . Lack of Transportation (Non-Medical): Not on file  Physical Activity:   . Days of Exercise per Week: Not on file  . Minutes of Exercise per Session: Not on file  Stress:   . Feeling of Stress : Not on file  Social Connections:   . Frequency of Communication with Friends and Family: Not on file  . Frequency of Social Gatherings with Friends and Family: Not on  file  . Attends Religious Services: Not on file  . Active Member of Clubs or Organizations: Not on file  . Attends Archivist Meetings: Not on file  . Marital Status: Not on file  Intimate Partner Violence:   . Fear of Current or Ex-Partner: Not on file  . Emotionally Abused: Not on file  . Physically Abused: Not on file  . Sexually Abused: Not on file    FAMILY HISTORY:  Family History  Problem Relation Age of Onset  . Hypertension Mother   . Heart attack Mother   . Diabetes Mother   . Skin cancer Mother   . Hypertension Father   . Heart attack Father   . Skin cancer Father   . Heart disease Maternal Grandmother   . Stroke Maternal Grandfather   . Hypertension Sister   . Skin cancer Sister   . Hypertension Brother   . Skin cancer Brother   . Hypertension Brother   . Cancer Brother        melanoma  . Skin cancer Brother   . Factor V Leiden deficiency Daughter     CURRENT MEDICATIONS:  Outpatient Encounter Medications as of 04/11/2019  Medication Sig  . acyclovir (ZOVIRAX) 400 MG tablet Take 1 tablet (400 mg total) by mouth 2 (two) times daily.  . ASPIRIN 81 PO Take 1 tablet by mouth daily.  . Calcium Carbonate-Vitamin D (CALCIUM + D PO) Take 1 tablet by mouth daily.  . Carfilzomib (KYPROLIS IV) Inject into the vein.  . Cholecalciferol (VITAMIN D) 2000 UNITS tablet Take 2,000 Units by mouth daily.  Marland Kitchen dexamethasone (DECADRON) 4 MG tablet Take 10 tablets (40 mg total) by mouth once a week.  Marland Kitchen ibuprofen (ADVIL,MOTRIN) 200 MG tablet Take 200 mg by mouth as needed.  Marland Kitchen levothyroxine (SYNTHROID, LEVOTHROID) 75 MCG tablet Take 1 tablet (75 mcg total) by mouth daily.  . magic mouthwash w/lidocaine SOLN Take 15 mLs by mouth every 4 (four) hours as needed for mouth pain.  . magic mouthwash w/lidocaine SOLN Take 15 mLs by mouth 4 (four) times daily.  Marland Kitchen oxyCODONE-acetaminophen (PERCOCET) 10-325 MG tablet Take 1 tablet by mouth every 8 (eight) hours as needed for pain.  (Patient not taking: Reported on 03/29/2019)  . pomalidomide (POMALYST) 4 MG capsule Take 1 capsule (4 mg total) by mouth daily.  . prochlorperazine (COMPAZINE) 10 MG tablet Take 1 tablet (10 mg total) by mouth every 6 (six) hours as needed for nausea or vomiting.  . triamcinolone cream (KENALOG) 0.1 % Apply BID to affected areas (Patient not taking: Reported on 03/29/2019)  . [DISCONTINUED] magic mouthwash w/lidocaine SOLN Take 15 mLs by mouth every 4 (four) hours as needed for mouth pain.  . [DISCONTINUED] magic mouthwash w/lidocaine SOLN Take 15 mLs by mouth every 4 (four) hours as needed for mouth pain.   Facility-Administered Encounter  Medications as of 04/11/2019  Medication  . 0.9 %  sodium chloride infusion  . 0.9 %  sodium chloride infusion  . sodium chloride 0.9 % injection 10 mL    ALLERGIES:  Allergies  Allergen Reactions  . Hydromorphone Hcl Nausea And Vomiting  . Poison Ivy Extract [Poison Ivy Extract]   . Tape Rash     PHYSICAL EXAM:  ECOG Performance status: 1  Vitals:   04/11/19 0831  BP: (!) 115/46  Pulse: (!) 101  Resp: 14  Temp: (!) 97.1 F (36.2 C)  SpO2: 100%   Filed Weights   04/11/19 0831  Weight: 133 lb 6.4 oz (60.5 kg)    Physical Exam Constitutional:      Appearance: She is well-developed.  Cardiovascular:     Rate and Rhythm: Normal rate and regular rhythm.     Heart sounds: Normal heart sounds.  Pulmonary:     Effort: Pulmonary effort is normal.     Breath sounds: Normal breath sounds.  Abdominal:     General: There is no distension.     Palpations: Abdomen is soft. There is no mass.  Musculoskeletal:        General: Normal range of motion.     Cervical back: Normal range of motion and neck supple.  Skin:    General: Skin is warm.  Neurological:     General: No focal deficit present.     Mental Status: She is alert and oriented to person, place, and time.  Psychiatric:        Mood and Affect: Mood normal.        Behavior:  Behavior normal.     LABORATORY DATA:  I have reviewed the labs as listed.  CBC    Component Value Date/Time   WBC 2.1 (L) 04/11/2019 0815   RBC 2.78 (L) 04/11/2019 0815   HGB 9.1 (L) 04/11/2019 0815   HCT 28.0 (L) 04/11/2019 0815   PLT 52 (L) 04/11/2019 0815   MCV 100.7 (H) 04/11/2019 0815   MCH 32.7 04/11/2019 0815   MCHC 32.5 04/11/2019 0815   RDW 13.8 04/11/2019 0815   LYMPHSABS 0.4 (L) 04/11/2019 0815   MONOABS 0.2 04/11/2019 0815   EOSABS 0.1 04/11/2019 0815   BASOSABS 0.0 04/11/2019 0815   CMP Latest Ref Rng & Units 04/11/2019 04/05/2019 04/03/2019  Glucose 70 - 99 mg/dL 136(H) 138(H) 105(H)  BUN 8 - 23 mg/dL _0 Creatinine 0.44 - 1.00 mg/dL 0.90 0.91 1.00  Sodium 135 - 145 mmol/L 136 137 137  Potassium 3.5 - 5.1 mmol/L 4.4 4.1 3.5  Chloride 98 - 111 mmol/L 103 100 101  CO2 22 - 32 mmol/L _1 Calcium 8.9 - 10.3 mg/dL 8.8(L) 9.7 9.2  Total Protein 6.5 - 8.1 g/dL 7.9 8.8(H) 8.9(H)  Total Bilirubin 0.3 - 1.2 mg/dL 0.5 0.7 0.6  Alkaline Phos 38 - 126 U/L 62 63 65  AST 15 - 41 U/L 46(H) 47(H) 62(H)  ALT 0 - 44 U/L 56(H) 56(H) 64(H)    I have personally reviewed her scans.  ASSESSMENT & PLAN:   Multiple myeloma in relapse ([place]) 1.  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 -Revlimid maintenance 5 mg 1 week on 1 week off, discontinued in October 2014 secondary to intolerance from low blood counts.  Zometa was discontinued in August 2014. -Bone marrow biopsy on 04/07/2017 at [facility] negative for  any residual myeloma. -Most recent surveillance labs on 02/08/2019 showed resurgence of M spike 0.4 g/dl and elevated free light chain ratio 5.5 with elevated kappa light chains of 94. -[facility] 03/13/2019 showed M spike of 1.9 g/dL and light chain ratio of 12.77 with kappa light chains of 151. -Bone marrow biopsy on 03/14/2019  showed 50% plasma cells.  Cytogenetics showed 42, XX.  FISH panel is pending. -PET CT scan on 03/10/2019 showed extensive bone involvement in the spine, sternum, bilateral ribs, clavicles, shoulders, pelvic girdle. -Myeloma labs on 03/29/2019 show kappa light chains of 243, lambda light chain 12.9 with ratio of 18.84.  M spike showed 2.9 g of monoclonal protein. -I discussed the results of FISH panel which showed +1 q. and t (4;14).  Both of these are high risk findings. -Carfilzomib started on 04/04/2019. -She reported slight worsening of neuropathy in the feet since treatment started last week. -She reported sore throat since Sunday.  She is having difficulty swallowing liquids and eating foods.  This is likely from radiation.  We will send in prescription for Magic mouthwash with lidocaine. -We reviewed her blood work.  Platelet count is 52.  White count is 2.1 with ANC of 1300.  She has not even started Pomalyst. -I think she will do poorly with full dose Pomalyst.  She apparently had severe cytopenias with Revlimid in the past.  Hence I will cut back on the dose of Pomalyst to 2 mg. -We will also send a prescription for dexamethasone 40 mg to be taken during her treatment week off. -We will see her back in 1 week for follow-up.  2.  Left shoulder, right lateral ribs and right hip pain: -Hypermetabolic lesion in the left distal clavicle and scapula. -XRT from 03/27/2019 through 06/07/2018 with improvement in pain. -She is taking hydrocodone as needed.  3.  New onset numbness: -Numbness in the chin area has decreased in the size. -Numbness in the left forearm has improved. -MRI of the cervical and thoracic spine on 03/21/2019 shows left dorsal and lateral epidural soft tissue present at the level of T1 and some extension to the C7 level superiorly.  There is involvement of the left C7-T1 neuroforamina.  No significant canal compromise. -These lesions are included in the radiation field.  4.   Hypercalcemia: -She developed hypercalcemia which was treated with Zometa on 03/29/2019. -Today her calcium is normal.      Orders placed this encounter:  Orders Placed This Encounter  Procedures  . CBC with Differential/Platelet  . Comprehensive metabolic panel  . Magnesium      Derek Jack, MD [place] [phone]

## 2019-04-11 NOTE — Progress Notes (Signed)
Labs reviewed with MD today. Platelets 52, 000. ANC 1.3. proceed with treatment.   Treatment given per orders. Patient tolerated it well without problems. Vitals stable and discharged home from clinic ambulatory. Follow up as scheduled.

## 2019-04-12 ENCOUNTER — Inpatient Hospital Stay (HOSPITAL_COMMUNITY): Payer: 59

## 2019-04-12 ENCOUNTER — Other Ambulatory Visit (HOSPITAL_COMMUNITY): Payer: Self-pay | Admitting: *Deleted

## 2019-04-12 ENCOUNTER — Encounter (HOSPITAL_COMMUNITY): Payer: Self-pay | Admitting: *Deleted

## 2019-04-12 ENCOUNTER — Other Ambulatory Visit: Payer: Self-pay

## 2019-04-12 VITALS — BP 132/70 | HR 98 | Temp 96.8°F | Resp 18

## 2019-04-12 DIAGNOSIS — C9001 Multiple myeloma in remission: Secondary | ICD-10-CM | POA: Diagnosis not present

## 2019-04-12 DIAGNOSIS — R197 Diarrhea, unspecified: Secondary | ICD-10-CM | POA: Diagnosis not present

## 2019-04-12 DIAGNOSIS — C9002 Multiple myeloma in relapse: Secondary | ICD-10-CM

## 2019-04-12 DIAGNOSIS — Z5112 Encounter for antineoplastic immunotherapy: Secondary | ICD-10-CM | POA: Diagnosis not present

## 2019-04-12 MED ORDER — PROCHLORPERAZINE MALEATE 10 MG PO TABS
10.0000 mg | ORAL_TABLET | Freq: Once | ORAL | Status: AC
  Administered 2019-04-12: 10 mg via ORAL

## 2019-04-12 MED ORDER — SODIUM CHLORIDE 0.9 % IV SOLN: Freq: Once | INTRAVENOUS | Status: AC

## 2019-04-12 MED ORDER — PROCHLORPERAZINE MALEATE 10 MG PO TABS
ORAL_TABLET | ORAL | Status: AC
  Filled 2019-04-12: qty 1

## 2019-04-12 MED ORDER — DEXTROSE 5 % IV SOLN
19.0000 mg/m2 | Freq: Once | INTRAVENOUS | Status: AC
  Administered 2019-04-12: 30 mg via INTRAVENOUS
  Filled 2019-04-12: qty 15

## 2019-04-12 MED ORDER — POMALIDOMIDE 2 MG PO CAPS: 2.0000 mg | ORAL_CAPSULE | Freq: Every day | ORAL | 0 refills | Status: DC

## 2019-04-12 NOTE — Telephone Encounter (Signed)
Per note from Dr. Delton Coombes, pomalyst decreased to 2 mg for 21 days then 7 days off. New script sent to pharmacy.

## 2019-04-12 NOTE — Progress Notes (Signed)
Treatment given per orders. Patient tolerated it well without problems. Vitals stable and discharged home from clinic ambulatory. Follow up as scheduled.  

## 2019-04-16 NOTE — Progress Notes (Signed)
  Radiation Oncology         [phone] ________________________________  Name: Melissa [name] MRN: [id number]  Date: 04/07/2019  DOB: 1955/08/12  End of Treatment Note  Diagnosis:   63 yo woman with myeloma and painful involvement of the left shoulder and right hip     Indication for treatment:  Palliation       Radiation treatment dates:   03/27/19-04/07/19  Site/dose:    1)  Left scapula and upper T-spine were treated to 20 Gy in 10 fractions of 2 Gy 2)  The right hip was treated to 20 Gy in 10 fractions of 2 Gy  Beams/energy:      1)  Left scapula and upper T-spine were treated using anterior and posterior fields 2)  The right hip was treated anterior and posterior fields with 3D techniques  Narrative: The patient tolerated radiation treatment relatively well.   He pain improved.  Plan: The patient has completed radiation treatment. The patient will return to radiation oncology clinic for routine followup in one month. I advised her to call or return sooner if she has any questions or concerns related to her recovery or treatment. ________________________________  Sheral Apley. [name], M.D.

## 2019-04-18 ENCOUNTER — Inpatient Hospital Stay (HOSPITAL_COMMUNITY): Payer: 59

## 2019-04-18 ENCOUNTER — Other Ambulatory Visit: Payer: Self-pay

## 2019-04-18 ENCOUNTER — Encounter (HOSPITAL_COMMUNITY): Payer: Self-pay | Admitting: Hematology

## 2019-04-18 ENCOUNTER — Inpatient Hospital Stay (HOSPITAL_BASED_OUTPATIENT_CLINIC_OR_DEPARTMENT_OTHER): Payer: 59 | Admitting: Hematology

## 2019-04-18 VITALS — BP 105/47 | HR 106 | Temp 97.5°F | Resp 18 | Wt 131.8 lb

## 2019-04-18 DIAGNOSIS — R197 Diarrhea, unspecified: Secondary | ICD-10-CM | POA: Diagnosis not present

## 2019-04-18 DIAGNOSIS — C9001 Multiple myeloma in remission: Secondary | ICD-10-CM | POA: Diagnosis not present

## 2019-04-18 DIAGNOSIS — C9002 Multiple myeloma in relapse: Secondary | ICD-10-CM

## 2019-04-18 DIAGNOSIS — Z5112 Encounter for antineoplastic immunotherapy: Secondary | ICD-10-CM | POA: Diagnosis not present

## 2019-04-18 LAB — CBC WITH DIFFERENTIAL/PLATELET
Abs Immature Granulocytes: 0.08 10*3/uL — ABNORMAL HIGH (ref 0.00–0.07)
Basophils Absolute: 0 10*3/uL (ref 0.0–0.1)
Basophils Relative: 0 %
Eosinophils Absolute: 0.1 10*3/uL (ref 0.0–0.5)
Eosinophils Relative: 4 %
HCT: 25.9 % — ABNORMAL LOW (ref 36.0–46.0)
Hemoglobin: 8.3 g/dL — ABNORMAL LOW (ref 12.0–15.0)
Immature Granulocytes: 5 %
Lymphocytes Relative: 25 %
Lymphs Abs: 0.4 10*3/uL — ABNORMAL LOW (ref 0.7–4.0)
MCH: 32 pg (ref 26.0–34.0)
MCHC: 32 g/dL (ref 30.0–36.0)
MCV: 100 fL (ref 80.0–100.0)
Monocytes Absolute: 0.2 10*3/uL (ref 0.1–1.0)
Monocytes Relative: 11 %
Neutro Abs: 0.9 10*3/uL — ABNORMAL LOW (ref 1.7–7.7)
Neutrophils Relative %: 55 %
Platelets: 38 10*3/uL — ABNORMAL LOW (ref 150–400)
RBC: 2.59 MIL/uL — ABNORMAL LOW (ref 3.87–5.11)
RDW: 13.7 % (ref 11.5–15.5)
WBC: 1.6 10*3/uL — ABNORMAL LOW (ref 4.0–10.5)
nRBC: 1.3 % — ABNORMAL HIGH (ref 0.0–0.2)

## 2019-04-18 LAB — COMPREHENSIVE METABOLIC PANEL
ALT: 30 U/L (ref 0–44)
AST: 26 U/L (ref 15–41)
Albumin: 2.9 g/dL — ABNORMAL LOW (ref 3.5–5.0)
Alkaline Phosphatase: 91 U/L (ref 38–126)
Anion gap: 11 (ref 5–15)
BUN: 16 mg/dL (ref 8–23)
CO2: 24 mmol/L (ref 22–32)
Calcium: 8.8 mg/dL — ABNORMAL LOW (ref 8.9–10.3)
Chloride: 103 mmol/L (ref 98–111)
Creatinine, Ser: 0.89 mg/dL (ref 0.44–1.00)
GFR calc Af Amer: >60 mL/min (ref >60)
GFR calc non Af Amer: >60 mL/min (ref >60)
Glucose, Bld: 105 mg/dL — ABNORMAL HIGH (ref 70–99)
Potassium: 4 mmol/L (ref 3.5–5.1)
Sodium: 138 mmol/L (ref 135–145)
Total Bilirubin: 0.6 mg/dL (ref 0.3–1.2)
Total Protein: 7.4 g/dL (ref 6.5–8.1)

## 2019-04-18 LAB — MAGNESIUM: Magnesium: 2.1 mg/dL (ref 1.7–2.4)

## 2019-04-18 MED ORDER — SODIUM CHLORIDE 0.9 % IV SOLN: Freq: Once | INTRAVENOUS | Status: AC

## 2019-04-18 MED ORDER — PROCHLORPERAZINE MALEATE 10 MG PO TABS
10.0000 mg | ORAL_TABLET | Freq: Once | ORAL | Status: AC
  Administered 2019-04-18: 10 mg via ORAL
  Filled 2019-04-18: qty 1

## 2019-04-18 MED ORDER — DEXTROSE 5 % IV SOLN
19.0000 mg/m2 | Freq: Once | INTRAVENOUS | Status: AC
  Administered 2019-04-18: 30 mg via INTRAVENOUS
  Filled 2019-04-18: qty 15

## 2019-04-18 MED ORDER — SODIUM CHLORIDE 0.9 % IV SOLN
40.0000 mg | Freq: Once | INTRAVENOUS | Status: AC
  Administered 2019-04-18: 40 mg via INTRAVENOUS
  Filled 2019-04-18: qty 4

## 2019-04-18 NOTE — Progress Notes (Signed)
04/18/19  Keep Kyprolis dose at 20 mg/m2 for C1D15 and D16  T.O. Dr Beckey Downing, LPN/Gaetana Kawahara Ronnald Ramp, PharmD

## 2019-04-18 NOTE — Progress Notes (Signed)
Labs reviewed with MD today. ANC 900, platelets 38,000 . Ok to proceed per MD.   Treatment given per orders. Patient tolerated it well without problems. Vitals stable and discharged home from clinic ambulatory. Follow up as scheduled.

## 2019-04-18 NOTE — Progress Notes (Signed)
[place], [place] [address]   CLINIC:  Medical Oncology/Hematology  PCP:  Kathyrn Drown, MD [address] [place] [address] [phone]   REASON FOR VISIT: Relapsed multiple myeloma  CURRENT THERAPY: Carfilzomib, pomalidomide and dexamethasone   INTERVAL HISTORY:  Melissa [name] 63 y.o. female seen for follow-up of multiple myeloma and toxicity assessment prior to day 15 of carfilzomib.  She has not received pomalidomide yet.  She also has not started acyclovir.  Appetite and energy levels are 75%.  Reported some pain in the posterior ribs, mainly on the right side.  She has to take oxycodone last night.  Otherwise she takes Tylenol.  Sore throat has improved with some exercises.  Lidocaine mouthwash did not help.  Denies any spontaneous bleeding.  Denies any fevers or chills.  Had 1 episode of diarrhea per day.  Numbness in the chin region has improved.  Numbness in the feet is stable.  REVIEW OF SYSTEMS:  Review of Systems  Musculoskeletal:       Right posterior rib pain present.  Neurological: Positive for numbness (In the chin and left forearm.).  All other systems reviewed and are negative.    PAST MEDICAL/SURGICAL HISTORY:  Past Medical History:  Diagnosis Date  . History of stem cell transplant ([place])    for multiple myeloma  . Hypothyroidism   . Multiple myeloma   . Peripheral neuropathy   . Seasonal allergies   . Thyroid disease    Past Surgical History:  Procedure Laterality Date  . CATARACT EXTRACTION W/PHACO Right 04/04/2013   Procedure: CATARACT EXTRACTION PHACO AND INTRAOCULAR LENS PLACEMENT (IOC);  Surgeon: Elta Guadeloupe T. Gershon Crane, MD;  Location: [facility];  Service: Ophthalmology;  Laterality: Right;  CDE:3.42  . CATARACT EXTRACTION W/PHACO Left 04/18/2013   Procedure: CATARACT EXTRACTION PHACO AND INTRAOCULAR LENS PLACEMENT (IOC);  Surgeon: Elta Guadeloupe T. Gershon Crane, MD;  Location: [facility];  Service: Ophthalmology;   Laterality: Left;  CDE:4.91  . CESAREAN SECTION  06/1993  . FASCIECTOMY Left 03/02/2017   Procedure: FASCIECTOMY, LEFT SMALL FINGER;  Surgeon: Daryll Brod, MD;  Location: [facility];  Service: Orthopedics;  Laterality: Left;  . HAND SURGERY Left   . TONSILECTOMY, ADENOIDECTOMY, BILATERAL MYRINGOTOMY AND TUBES    . TONSILLECTOMY    . TUBAL LIGATION    . YAG LASER APPLICATION Right 3/42/8768   Procedure: YAG LASER APPLICATION;  Surgeon: Rutherford Guys, MD;  Location: [facility];  Service: Ophthalmology;  Laterality: Right;  . YAG LASER APPLICATION Left 05/11/7260   Procedure: YAG LASER APPLICATION;  Surgeon: Rutherford Guys, MD;  Location: [facility];  Service: Ophthalmology;  Laterality: Left;     SOCIAL HISTORY:  Social History   Socioeconomic History  . Marital status: Married    Spouse name: Not on file  . Number of children: Not on file  . Years of education: Not on file  . Highest education level: Not on file  Occupational History  . Not on file  Tobacco Use  . Smoking status: Never Smoker  . Smokeless tobacco: Never Used  Substance and Sexual Activity  . Alcohol use: Yes    Comment: occ  . Drug use: No  . Sexual activity: Not Currently    Birth control/protection: Post-menopausal  Other Topics Concern  . Not on file  Social History Narrative  . Not on file   Social Determinants of Health   Financial Resource Strain:   . Difficulty of Paying  Living Expenses: Not on file  Food Insecurity:   . Worried About Charity fundraiser in the Last Year: Not on file  . Ran Out of Food in the Last Year: Not on file  Transportation Needs:   . Lack of Transportation (Medical): Not on file  . Lack of Transportation (Non-Medical): Not on file  Physical Activity:   . Days of Exercise per Week: Not on file  . Minutes of Exercise per Session: Not on file  Stress:   . Feeling of Stress : Not on file  Social Connections:   . Frequency of Communication with Friends and Family:  Not on file  . Frequency of Social Gatherings with Friends and Family: Not on file  . Attends Religious Services: Not on file  . Active Member of Clubs or Organizations: Not on file  . Attends Archivist Meetings: Not on file  . Marital Status: Not on file  Intimate Partner Violence:   . Fear of Current or Ex-Partner: Not on file  . Emotionally Abused: Not on file  . Physically Abused: Not on file  . Sexually Abused: Not on file    FAMILY HISTORY:  Family History  Problem Relation Age of Onset  . Hypertension Mother   . Heart attack Mother   . Diabetes Mother   . Skin cancer Mother   . Hypertension Father   . Heart attack Father   . Skin cancer Father   . Heart disease Maternal Grandmother   . Stroke Maternal Grandfather   . Hypertension Sister   . Skin cancer Sister   . Hypertension Brother   . Skin cancer Brother   . Hypertension Brother   . Cancer Brother        melanoma  . Skin cancer Brother   . Factor V Leiden deficiency Daughter     CURRENT MEDICATIONS:  Outpatient Encounter Medications as of 04/18/2019  Medication Sig Note  . Calcium Carbonate-Vitamin D (CALCIUM + D PO) Take 1 tablet by mouth daily.   . Carfilzomib (KYPROLIS IV) Inject into the vein.   . Cholecalciferol (VITAMIN D) 2000 UNITS tablet Take 2,000 Units by mouth daily.   Marland Kitchen dexamethasone (DECADRON) 4 MG tablet Take 10 tablets (40 mg total) by mouth once a week.   . levothyroxine (SYNTHROID, LEVOTHROID) 75 MCG tablet Take 1 tablet (75 mcg total) by mouth daily.   Marland Kitchen acyclovir (ZOVIRAX) 400 MG tablet Take 1 tablet (400 mg total) by mouth 2 (two) times daily. 04/18/2019: Pt hasn't started yet  . ASPIRIN 81 PO Take 1 tablet by mouth daily.   Marland Kitchen ibuprofen (ADVIL,MOTRIN) 200 MG tablet Take 200 mg by mouth as needed.   . magic mouthwash w/lidocaine SOLN Take 15 mLs by mouth every 4 (four) hours as needed for mouth pain. (Patient not taking: Reported on 04/18/2019)   . magic mouthwash w/lidocaine  SOLN Take 15 mLs by mouth 4 (four) times daily. (Patient not taking: Reported on 04/18/2019)   . oxyCODONE-acetaminophen (PERCOCET) 10-325 MG tablet Take 1 tablet by mouth every 8 (eight) hours as needed for pain. (Patient not taking: Reported on 03/29/2019)   . pomalidomide (POMALYST) 2 MG capsule Take 1 capsule (2 mg total) by mouth daily. For 21 days and then take 7 days off. 04/18/2019: Pt hasn't started yet  . prochlorperazine (COMPAZINE) 10 MG tablet Take 1 tablet (10 mg total) by mouth every 6 (six) hours as needed for nausea or vomiting. (Patient not taking: Reported on 04/18/2019)   .  triamcinolone cream (KENALOG) 0.1 % Apply BID to affected areas (Patient not taking: Reported on 03/29/2019)    Facility-Administered Encounter Medications as of 04/18/2019  Medication  . 0.9 %  sodium chloride infusion  . 0.9 %  sodium chloride infusion  . sodium chloride 0.9 % injection 10 mL    ALLERGIES:  Allergies  Allergen Reactions  . Hydromorphone Hcl Nausea And Vomiting  . Poison Ivy Extract [Poison Ivy Extract]   . Tape Rash     PHYSICAL EXAM:  ECOG Performance status: 1  Vitals:   04/18/19 0906  BP: (!) 105/47  Pulse: (!) 106  Resp: 18  Temp: (!) 97.5 F (36.4 C)  SpO2: 100%   Filed Weights   04/18/19 0906  Weight: 131 lb 12.8 oz (59.8 kg)    Physical Exam Constitutional:      Appearance: She is well-developed.  Cardiovascular:     Rate and Rhythm: Normal rate and regular rhythm.     Heart sounds: Normal heart sounds.  Pulmonary:     Effort: Pulmonary effort is normal.     Breath sounds: Normal breath sounds.  Abdominal:     General: There is no distension.     Palpations: Abdomen is soft. There is no mass.  Musculoskeletal:        General: Normal range of motion.     Cervical back: Normal range of motion and neck supple.  Skin:    General: Skin is warm.  Neurological:     General: No focal deficit present.     Mental Status: She is alert and oriented to  person, place, and time.  Psychiatric:        Mood and Affect: Mood normal.        Behavior: Behavior normal.     LABORATORY DATA:  I have reviewed the labs as listed.  CBC    Component Value Date/Time   WBC 1.6 (L) 04/18/2019 0818   RBC 2.59 (L) 04/18/2019 0818   HGB 8.3 (L) 04/18/2019 0818   HCT 25.9 (L) 04/18/2019 0818   PLT 38 (L) 04/18/2019 0818   MCV 100.0 04/18/2019 0818   MCH 32.0 04/18/2019 0818   MCHC 32.0 04/18/2019 0818   RDW 13.7 04/18/2019 0818   LYMPHSABS 0.4 (L) 04/18/2019 0818   MONOABS 0.2 04/18/2019 0818   EOSABS 0.1 04/18/2019 0818   BASOSABS 0.0 04/18/2019 0818   CMP Latest Ref Rng & Units 04/18/2019 04/11/2019 04/05/2019  Glucose 70 - 99 mg/dL 105(H) 136(H) 138(H)  BUN 8 - 23 mg/dL 16 15 21   Creatinine 0.44 - 1.00 mg/dL 0.89 0.90 0.91  Sodium 135 - 145 mmol/L 138 136 137  Potassium 3.5 - 5.1 mmol/L 4.0 4.4 4.1  Chloride 98 - 111 mmol/L 103 103 100  CO2 22 - 32 mmol/L 24 25 22   Calcium 8.9 - 10.3 mg/dL 8.8(L) 8.8(L) 9.7  Total Protein 6.5 - 8.1 g/dL 7.4 7.9 8.8(H)  Total Bilirubin 0.3 - 1.2 mg/dL 0.6 0.5 0.7  Alkaline Phos 38 - 126 U/L 91 62 63  AST 15 - 41 U/L 26 46(H) 47(H)  ALT 0 - 44 U/L 30 56(H) 56(H)    I have personally reviewed her scans.  ASSESSMENT & PLAN:   Multiple myeloma in relapse ([facility]) 1.  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 -Revlimid maintenance 5 mg 1 week on 1 week off, discontinued in October 2014 secondary to intolerance from low  blood counts.  Zometa was discontinued in August 2014. -Bone [facility] on 04/07/2017 [facility] negative for any residual myeloma. -Most recent surveillance labs on 02/08/2019 showed resurgence of M spike 0.4 g/dl and elevated free light chain ratio 5.5 with elevated kappa light chains of 94. -Her labs at [facility] on 03/13/2019 showed M spike of 1.9 g/dL and light chain  ratio of 12.77 with kappa light chains of 151. -Bone marrow biopsy on 03/14/2019 showed 50% plasma cells.  Cytogenetics showed 37, XX.  FISH panel is pending. -PET CT scan on 03/10/2019 showed extensive bone involvement in the spine, sternum, bilateral ribs, clavicles, shoulders, pelvic girdle. -Myeloma labs on 03/29/2019 show kappa light chains of 243, lambda light chain 12.9 with ratio of 18.84.  M spike showed 2.9 g of monoclonal protein. -I discussed the results of FISH panel which showed +1 q. and t (4;14).  Both of these are high risk findings. -Carfilzomib started on 04/04/2019. -We are maintaining carfilzomib dose at 20 mg per metered square during cycle 1. -We reviewed her labs.  White count is 1.6 with ANC of 900.  Hemoglobin is 8.3.  Platelet count is 38. -We will proceed with day 15 and 16 of carfilzomib today and tomorrow. -I have prescribed her Pomalyst at a lower dose of 2 mg 3 weeks on 1 week off.  She has not received the shipment of Pomalyst yet. -Next week will be her off week.  We will monitor her blood counts to see if she needs any blood or platelet transfusion.  If [place] is severely low or less than 500, will consider G-CSF.  She was told to take dexamethasone 40 mg next week at home. -She has developed mucositis after radiation which improved after exercises.  Mouthwash did not help. -I will see her back in 2 weeks prior to start of cycle 2.  We will plan to initiate Pomalyst with cycle 2.  2.  Left shoulder, right lateral ribs and right hip pain: -Hypermetabolic lesion in the left distal clavicle and scapula. -XRT from 03/27/2019 through 06/07/2018 with improvement in pain. -Left shoulder and right hip pain have improved.  She has a posterior rib pains, mostly on the right side at nighttime.  She is taking oxycodone as needed.  She also takes Tylenol.  3.  New onset numbness: -Numbness in the chin has improved.  Numbness in the left arm is gone completely. -MRI of the  cervical and thoracic spine on 03/21/2019 shows left dorsal and lateral epidural soft tissue present at the level of T1 and some extension to the C7 level superiorly.  There is involvement of the left C7-T1 neuroforamina.  No significant canal compromise. -These lesions are included in the radiation field.  4.  Hypercalcemia: -Treated with Zometa on 03/29/2019. -Today calcium is 8.8.  5.  ID prophylaxis: -She has not received acyclovir shipment from [facility] long outpatient pharmacy yet.  She will start acyclovir twice daily as soon as she receives it.      Orders placed this encounter:  Orders Placed This Encounter  Procedures  . CBC with Differential/Platelet  . Comprehensive metabolic panel  . Sample to Blood Bank      Derek Jack, [place] [phone]

## 2019-04-18 NOTE — Assessment & Plan Note (Signed)
1.  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 -Revlimid maintenance 5 mg 1 week on 1 week off, discontinued in October 2014 secondary to intolerance from low blood counts.  Zometa was discontinued in August 2014. -Bone marrow biopsy on 04/07/2017 at [facility] negative for any residual myeloma. -Most recent surveillance labs on 02/08/2019 showed resurgence of M spike 0.4 g/dl and elevated free light chain ratio 5.5 with elevated kappa light chains of 94. -Her labs at [facility] on 03/13/2019 showed M spike of 1.9 g/dL and light chain ratio of 12.77 with kappa light chains of 151. -Bone marrow biopsy on 03/14/2019 showed 50% plasma cells.  Cytogenetics showed 16, XX.  FISH panel is pending. -PET CT scan on 03/10/2019 showed extensive bone involvement in the spine, sternum, bilateral ribs, clavicles, shoulders, pelvic girdle. -Myeloma labs on 03/29/2019 show kappa light chains of 243, lambda light chain 12.9 with ratio of 18.84.  M spike showed 2.9 g of monoclonal protein. -I discussed the results of FISH panel which showed +1 q. and t (4;14).  Both of these are high risk findings. -Carfilzomib started on 04/04/2019. -We are maintaining carfilzomib dose at 20 mg per metered square during cycle 1. -We reviewed her labs.  White count is 1.6 with ANC of 900.  Hemoglobin is 8.3.  Platelet count is 38. -We will proceed with day 15 and 16 of carfilzomib today and tomorrow. -I have prescribed her Pomalyst at a lower dose of 2 mg 3 weeks on 1 week off.  She has not received the shipment of Pomalyst yet. -Next week will be her off week.  We will monitor her blood counts to see if she needs any blood or platelet transfusion.  If [place] is severely low or less than 500, will consider G-CSF.  She was told to take dexamethasone 40 mg next week at home. -She has developed mucositis after radiation which  improved after exercises.  Mouthwash did not help. -I will see her back in 2 weeks prior to start of cycle 2.  We will plan to initiate Pomalyst with cycle 2.  2.  Left shoulder, right lateral ribs and right hip pain: -Hypermetabolic lesion in the left distal clavicle and scapula. -XRT from 03/27/2019 through 06/07/2018 with improvement in pain. -Left shoulder and right hip pain have improved.  She has a posterior rib pains, mostly on the right side at nighttime.  She is taking oxycodone as needed.  She also takes Tylenol.  3.  New onset numbness: -Numbness in the chin has improved.  Numbness in the left arm is gone completely. -MRI of the cervical and thoracic spine on 03/21/2019 shows left dorsal and lateral epidural soft tissue present at the level of T1 and some extension to the C7 level superiorly.  There is involvement of the left C7-T1 neuroforamina.  No significant canal compromise. -These lesions are included in the radiation field.  4.  Hypercalcemia: -Treated with Zometa on 03/29/2019. -Today calcium is 8.8.  5.  ID prophylaxis: -She has not received acyclovir shipment from [facility] long outpatient pharmacy yet.  She will start acyclovir twice daily as soon as she receives it.

## 2019-04-18 NOTE — Patient Instructions (Signed)
[place] at [facility] Discharge Instructions  You were seen today by Dr. Delton Coombes. He went over your recent lab results. He will recheck your blood work next week. He will see you back in 2 weeks for labs and follow up.   Thank you for choosing [place] at [facility] to provide your oncology and hematology care.  To afford each patient quality time with our provider, please arrive at least 15 minutes before your scheduled appointment time.   If you have a lab appointment with the [facility] please come in thru the  Main Entrance and check in at the main information desk  You need to re-schedule your appointment should you arrive 10 or more minutes late.  We strive to give you quality time with our providers, and arriving late affects you and other patients whose appointments are after yours.  Also, if you no show three or more times for appointments you may be dismissed from the clinic at the providers discretion.     Again, thank you for choosing [facility].  Our hope is that these requests will decrease the amount of time that you wait before being seen by our physicians.       _____________________________________________________________  Should you have questions after your visit to [facility], please contact our office at [phone] between the hours of 8:00 a.m. and 4:30 p.m.  Voicemails left after 4:00 p.m. will not be returned until the following business day.  For prescription refill requests, have your pharmacy contact our office and allow 72 hours.    Cancer Center Support Programs:   > Cancer Support Group  2nd Tuesday of the month 1pm-2pm, Journey Room

## 2019-04-19 ENCOUNTER — Inpatient Hospital Stay (HOSPITAL_COMMUNITY): Payer: 59

## 2019-04-19 VITALS — BP 127/62 | HR 102 | Temp 96.9°F | Resp 18

## 2019-04-19 DIAGNOSIS — C9001 Multiple myeloma in remission: Secondary | ICD-10-CM | POA: Diagnosis not present

## 2019-04-19 DIAGNOSIS — C9002 Multiple myeloma in relapse: Secondary | ICD-10-CM

## 2019-04-19 DIAGNOSIS — Z5112 Encounter for antineoplastic immunotherapy: Secondary | ICD-10-CM | POA: Diagnosis not present

## 2019-04-19 DIAGNOSIS — R197 Diarrhea, unspecified: Secondary | ICD-10-CM | POA: Diagnosis not present

## 2019-04-19 MED ORDER — DEXTROSE 5 % IV SOLN
19.0000 mg/m2 | Freq: Once | INTRAVENOUS | Status: AC
  Administered 2019-04-19: 30 mg via INTRAVENOUS
  Filled 2019-04-19: qty 15

## 2019-04-19 MED ORDER — PROCHLORPERAZINE MALEATE 10 MG PO TABS
ORAL_TABLET | ORAL | Status: AC
  Filled 2019-04-19: qty 1

## 2019-04-19 MED ORDER — PROCHLORPERAZINE MALEATE 10 MG PO TABS
10.0000 mg | ORAL_TABLET | Freq: Once | ORAL | Status: AC
  Administered 2019-04-19: 10 mg via ORAL

## 2019-04-19 MED ORDER — SODIUM CHLORIDE 0.9 % IV SOLN: Freq: Once | INTRAVENOUS | Status: AC

## 2019-04-19 NOTE — Progress Notes (Signed)
Treatment given per orders. Patient tolerated it well without problems. Vitals stable and discharged home from clinic ambulatory. Follow up as scheduled.  

## 2019-04-20 MED FILL — ACYCLOVIR 400 MG TABLET: 400 | 30 days supply | Qty: 60 | Fill #0

## 2019-04-25 ENCOUNTER — Inpatient Hospital Stay (HOSPITAL_COMMUNITY): Payer: 59

## 2019-04-25 ENCOUNTER — Other Ambulatory Visit: Payer: Self-pay

## 2019-04-25 DIAGNOSIS — C9001 Multiple myeloma in remission: Secondary | ICD-10-CM | POA: Diagnosis not present

## 2019-04-25 DIAGNOSIS — R197 Diarrhea, unspecified: Secondary | ICD-10-CM | POA: Diagnosis not present

## 2019-04-25 DIAGNOSIS — Z5112 Encounter for antineoplastic immunotherapy: Secondary | ICD-10-CM | POA: Diagnosis not present

## 2019-04-25 DIAGNOSIS — C9002 Multiple myeloma in relapse: Secondary | ICD-10-CM

## 2019-04-25 LAB — COMPREHENSIVE METABOLIC PANEL
ALT: 27 U/L (ref 0–44)
AST: 28 U/L (ref 15–41)
Albumin: 3.1 g/dL — ABNORMAL LOW (ref 3.5–5.0)
Alkaline Phosphatase: 118 U/L (ref 38–126)
Anion gap: 11 (ref 5–15)
BUN: 15 mg/dL (ref 8–23)
CO2: 24 mmol/L (ref 22–32)
Calcium: 9 mg/dL (ref 8.9–10.3)
Chloride: 103 mmol/L (ref 98–111)
Creatinine, Ser: 0.93 mg/dL (ref 0.44–1.00)
GFR calc Af Amer: >60 mL/min (ref >60)
GFR calc non Af Amer: >60 mL/min (ref >60)
Glucose, Bld: 106 mg/dL — ABNORMAL HIGH (ref 70–99)
Potassium: 4.4 mmol/L (ref 3.5–5.1)
Sodium: 138 mmol/L (ref 135–145)
Total Bilirubin: 0.8 mg/dL (ref 0.3–1.2)
Total Protein: 7.8 g/dL (ref 6.5–8.1)

## 2019-04-25 LAB — CBC WITH DIFFERENTIAL/PLATELET
Abs Immature Granulocytes: 0.06 10*3/uL (ref 0.00–0.07)
Basophils Absolute: 0 10*3/uL (ref 0.0–0.1)
Basophils Relative: 0 %
Eosinophils Absolute: 0 10*3/uL (ref 0.0–0.5)
Eosinophils Relative: 3 %
HCT: 25.8 % — ABNORMAL LOW (ref 36.0–46.0)
Hemoglobin: 8.5 g/dL — ABNORMAL LOW (ref 12.0–15.0)
Immature Granulocytes: 4 %
Lymphocytes Relative: 20 %
Lymphs Abs: 0.3 10*3/uL — ABNORMAL LOW (ref 0.7–4.0)
MCH: 33.3 pg (ref 26.0–34.0)
MCHC: 32.9 g/dL (ref 30.0–36.0)
MCV: 101.2 fL — ABNORMAL HIGH (ref 80.0–100.0)
Monocytes Absolute: 0.2 10*3/uL (ref 0.1–1.0)
Monocytes Relative: 14 %
Neutro Abs: 0.9 10*3/uL — ABNORMAL LOW (ref 1.7–7.7)
Neutrophils Relative %: 59 %
Platelets: 49 10*3/uL — ABNORMAL LOW (ref 150–400)
RBC: 2.55 MIL/uL — ABNORMAL LOW (ref 3.87–5.11)
RDW: 14.9 % (ref 11.5–15.5)
WBC: 1.5 10*3/uL — ABNORMAL LOW (ref 4.0–10.5)
nRBC: 1.4 % — ABNORMAL HIGH (ref 0.0–0.2)

## 2019-04-28 HISTORY — PX: TUMOR REMOVAL: SHX12

## 2019-05-01 ENCOUNTER — Other Ambulatory Visit (HOSPITAL_COMMUNITY): Payer: Self-pay | Admitting: Nurse Practitioner

## 2019-05-01 ENCOUNTER — Other Ambulatory Visit (HOSPITAL_COMMUNITY): Payer: Self-pay | Admitting: *Deleted

## 2019-05-01 DIAGNOSIS — C9002 Multiple myeloma in relapse: Secondary | ICD-10-CM

## 2019-05-01 NOTE — Progress Notes (Signed)
Patient called clinic today reporting severe pain onset Saturday afternoon.  It comes from her back and around to her rib cage, bilateral sides.  She is unable to sleep due to the pain.  She has return of numbness to right arm and has new onset of weakness in her left leg and pain in bilateral thighs.    I spoke with Francene Finders, NP and she said patient can take pain medication every 6 hours instead of every 8.    Per Dr. Delton Coombes patient is to have stat MRI lumbar spine due to new onset of weakness and pain.    Orders placed.   Patient is aware of the above and verbalizes understanding.  We will get patient scheduled and call her with the appointments.

## 2019-05-02 ENCOUNTER — Other Ambulatory Visit: Payer: Self-pay

## 2019-05-02 ENCOUNTER — Ambulatory Visit (HOSPITAL_COMMUNITY)
Admission: RE | Admit: 2019-05-02 | Discharge: 2019-05-02 | Disposition: A | Discharge status: Home / Self Care | Payer: 59 | Source: Ambulatory Visit | Attending: Hematology | Admitting: Hematology

## 2019-05-02 DIAGNOSIS — M5127 Other intervertebral disc displacement, lumbosacral region: Secondary | ICD-10-CM | POA: Diagnosis not present

## 2019-05-02 DIAGNOSIS — C9002 Multiple myeloma in relapse: Secondary | ICD-10-CM | POA: Insufficient documentation

## 2019-05-02 MED ORDER — GADOBUTROL 1 MMOL/ML IV SOLN
7.0000 mL | Freq: Once | INTRAVENOUS | Status: AC | PRN
  Administered 2019-05-02: 7 mL via INTRAVENOUS

## 2019-05-03 DIAGNOSIS — Z510 Encounter for antineoplastic radiation therapy: Secondary | ICD-10-CM | POA: Diagnosis not present

## 2019-05-03 DIAGNOSIS — C9000 Multiple myeloma not having achieved remission: Secondary | ICD-10-CM | POA: Diagnosis not present

## 2019-05-03 DIAGNOSIS — M545 Low back pain: Secondary | ICD-10-CM | POA: Diagnosis not present

## 2019-05-03 DIAGNOSIS — G9589 Other specified diseases of spinal cord: Secondary | ICD-10-CM | POA: Diagnosis not present

## 2019-05-03 DIAGNOSIS — Z9484 Stem cells transplant status: Secondary | ICD-10-CM | POA: Diagnosis not present

## 2019-05-03 DIAGNOSIS — C9001 Multiple myeloma in remission: Secondary | ICD-10-CM | POA: Diagnosis not present

## 2019-05-03 DIAGNOSIS — R9431 Abnormal electrocardiogram [ECG] [EKG]: Secondary | ICD-10-CM | POA: Diagnosis not present

## 2019-05-03 DIAGNOSIS — C9002 Multiple myeloma in relapse: Secondary | ICD-10-CM | POA: Diagnosis not present

## 2019-05-03 DIAGNOSIS — G992 Myelopathy in diseases classified elsewhere: Secondary | ICD-10-CM | POA: Diagnosis not present

## 2019-05-03 DIAGNOSIS — M532X4 Spinal instabilities, thoracic region: Secondary | ICD-10-CM | POA: Diagnosis not present

## 2019-05-03 DIAGNOSIS — I728 Aneurysm of other specified arteries: Secondary | ICD-10-CM | POA: Diagnosis not present

## 2019-05-03 DIAGNOSIS — M8458XA Pathological fracture in neoplastic disease, other specified site, initial encounter for fracture: Secondary | ICD-10-CM | POA: Diagnosis not present

## 2019-05-03 DIAGNOSIS — M5126 Other intervertebral disc displacement, lumbar region: Secondary | ICD-10-CM | POA: Diagnosis not present

## 2019-05-03 DIAGNOSIS — R000 Tachycardia, unspecified: Secondary | ICD-10-CM | POA: Diagnosis not present

## 2019-05-03 DIAGNOSIS — M898X8 Other specified disorders of bone, other site: Secondary | ICD-10-CM | POA: Diagnosis not present

## 2019-05-03 DIAGNOSIS — J810 Acute pulmonary edema: Secondary | ICD-10-CM | POA: Diagnosis not present

## 2019-05-03 DIAGNOSIS — J90 Pleural effusion, not elsewhere classified: Secondary | ICD-10-CM | POA: Diagnosis not present

## 2019-05-03 DIAGNOSIS — T451X5A Adverse effect of antineoplastic and immunosuppressive drugs, initial encounter: Secondary | ICD-10-CM | POA: Diagnosis not present

## 2019-05-03 DIAGNOSIS — C7951 Secondary malignant neoplasm of bone: Secondary | ICD-10-CM | POA: Diagnosis not present

## 2019-05-03 DIAGNOSIS — D61810 Antineoplastic chemotherapy induced pancytopenia: Secondary | ICD-10-CM | POA: Diagnosis not present

## 2019-05-03 DIAGNOSIS — G9520 Unspecified cord compression: Secondary | ICD-10-CM | POA: Diagnosis not present

## 2019-05-03 DIAGNOSIS — M79604 Pain in right leg: Secondary | ICD-10-CM | POA: Diagnosis not present

## 2019-05-03 DIAGNOSIS — J9811 Atelectasis: Secondary | ICD-10-CM | POA: Diagnosis not present

## 2019-05-03 DIAGNOSIS — M47812 Spondylosis without myelopathy or radiculopathy, cervical region: Secondary | ICD-10-CM | POA: Diagnosis not present

## 2019-05-03 DIAGNOSIS — E039 Hypothyroidism, unspecified: Secondary | ICD-10-CM | POA: Diagnosis not present

## 2019-05-03 DIAGNOSIS — M79605 Pain in left leg: Secondary | ICD-10-CM | POA: Diagnosis not present

## 2019-05-03 DIAGNOSIS — G9529 Other cord compression: Secondary | ICD-10-CM | POA: Diagnosis not present

## 2019-05-04 ENCOUNTER — Telehealth: Payer: Self-pay | Admitting: *Deleted

## 2019-05-04 ENCOUNTER — Other Ambulatory Visit (HOSPITAL_COMMUNITY): Payer: Self-pay | Admitting: Nurse Practitioner

## 2019-05-04 ENCOUNTER — Inpatient Hospital Stay (HOSPITAL_COMMUNITY): Payer: 59

## 2019-05-04 ENCOUNTER — Ambulatory Visit (HOSPITAL_COMMUNITY): Payer: 59 | Admitting: Hematology

## 2019-05-04 ENCOUNTER — Other Ambulatory Visit (HOSPITAL_COMMUNITY): Payer: 59

## 2019-05-04 ENCOUNTER — Encounter (HOSPITAL_COMMUNITY): Payer: Self-pay | Admitting: *Deleted

## 2019-05-04 ENCOUNTER — Ambulatory Visit (HOSPITAL_COMMUNITY): Payer: 59

## 2019-05-04 ENCOUNTER — Other Ambulatory Visit (HOSPITAL_COMMUNITY): Payer: Self-pay | Admitting: *Deleted

## 2019-05-04 DIAGNOSIS — G992 Myelopathy in diseases classified elsewhere: Secondary | ICD-10-CM | POA: Diagnosis not present

## 2019-05-04 DIAGNOSIS — M532X4 Spinal instabilities, thoracic region: Secondary | ICD-10-CM | POA: Diagnosis not present

## 2019-05-04 DIAGNOSIS — R000 Tachycardia, unspecified: Secondary | ICD-10-CM | POA: Diagnosis not present

## 2019-05-04 DIAGNOSIS — C9000 Multiple myeloma not having achieved remission: Secondary | ICD-10-CM | POA: Diagnosis not present

## 2019-05-04 DIAGNOSIS — C7951 Secondary malignant neoplasm of bone: Secondary | ICD-10-CM | POA: Diagnosis not present

## 2019-05-04 DIAGNOSIS — C9002 Multiple myeloma in relapse: Secondary | ICD-10-CM

## 2019-05-04 DIAGNOSIS — G9589 Other specified diseases of spinal cord: Secondary | ICD-10-CM | POA: Diagnosis not present

## 2019-05-04 DIAGNOSIS — M8458XA Pathological fracture in neoplastic disease, other specified site, initial encounter for fracture: Secondary | ICD-10-CM | POA: Diagnosis not present

## 2019-05-04 DIAGNOSIS — D61810 Antineoplastic chemotherapy induced pancytopenia: Secondary | ICD-10-CM | POA: Diagnosis not present

## 2019-05-04 DIAGNOSIS — G9529 Other cord compression: Secondary | ICD-10-CM | POA: Diagnosis not present

## 2019-05-04 DIAGNOSIS — G9520 Unspecified cord compression: Secondary | ICD-10-CM | POA: Diagnosis not present

## 2019-05-04 MED ORDER — ACYCLOVIR 400 MG PO TABS
400.00 | ORAL_TABLET | ORAL | Status: DC
Start: 2019-05-11 — End: 2019-05-04

## 2019-05-04 MED ORDER — PANTOPRAZOLE SODIUM 40 MG PO TBEC
40.00 | DELAYED_RELEASE_TABLET | ORAL | Status: DC
Start: 2019-05-12 — End: 2019-05-04

## 2019-05-04 MED ORDER — ALLOPURINOL 100 MG PO TABS
300.00 | ORAL_TABLET | ORAL | Status: DC
Start: 2019-05-11 — End: 2019-05-04

## 2019-05-04 MED ORDER — GENERIC EXTERNAL MEDICATION
Status: DC
Start: ? — End: 2019-05-04

## 2019-05-04 MED ORDER — DEXAMETHASONE SOD PHOSPHATE PF 10 MG/ML IJ SOLN
6.00 | INTRAMUSCULAR | Status: DC
Start: 2019-05-04 — End: 2019-05-04

## 2019-05-04 MED ORDER — POMALIDOMIDE 2 MG PO CAPS: 2.0000 mg | ORAL_CAPSULE | Freq: Every day | ORAL | 0 refills | Status: DC

## 2019-05-04 MED ORDER — PROCHLORPERAZINE MALEATE 5 MG PO TABS
10.00 | ORAL_TABLET | ORAL | Status: DC
Start: ? — End: 2019-05-04

## 2019-05-04 MED ORDER — LEVOTHYROXINE SODIUM 25 MCG PO TABS
75.00 | ORAL_TABLET | ORAL | Status: DC
Start: 2019-05-12 — End: 2019-05-04

## 2019-05-04 NOTE — Telephone Encounter (Signed)
On 05-03-19 jessica faxed medical records to attn brenda at [facility], it was consult note, sim & planning note, end of tx note

## 2019-05-05 ENCOUNTER — Encounter: Payer: Self-pay | Admitting: *Deleted

## 2019-05-05 ENCOUNTER — Ambulatory Visit: Payer: 59 | Admitting: Urology

## 2019-05-05 ENCOUNTER — Ambulatory Visit (HOSPITAL_COMMUNITY): Payer: 59

## 2019-05-05 DIAGNOSIS — G9529 Other cord compression: Secondary | ICD-10-CM | POA: Diagnosis not present

## 2019-05-05 DIAGNOSIS — G992 Myelopathy in diseases classified elsewhere: Secondary | ICD-10-CM | POA: Diagnosis not present

## 2019-05-05 DIAGNOSIS — D61810 Antineoplastic chemotherapy induced pancytopenia: Secondary | ICD-10-CM | POA: Diagnosis not present

## 2019-05-05 DIAGNOSIS — R000 Tachycardia, unspecified: Secondary | ICD-10-CM | POA: Diagnosis not present

## 2019-05-05 DIAGNOSIS — C9002 Multiple myeloma in relapse: Secondary | ICD-10-CM | POA: Diagnosis not present

## 2019-05-05 DIAGNOSIS — C9000 Multiple myeloma not having achieved remission: Secondary | ICD-10-CM | POA: Diagnosis not present

## 2019-05-05 DIAGNOSIS — Z510 Encounter for antineoplastic radiation therapy: Secondary | ICD-10-CM | POA: Diagnosis not present

## 2019-05-05 DIAGNOSIS — G9520 Unspecified cord compression: Secondary | ICD-10-CM | POA: Diagnosis not present

## 2019-05-05 DIAGNOSIS — C7951 Secondary malignant neoplasm of bone: Secondary | ICD-10-CM | POA: Diagnosis not present

## 2019-05-05 MED ORDER — POLYETHYLENE GLYCOL 3350 17 GM/SCOOP PO POWD
17.00 | ORAL | Status: DC
Start: 2019-05-06 — End: 2019-05-05

## 2019-05-05 MED ORDER — BISACODYL 10 MG RE SUPP
10.00 | RECTAL | Status: DC
Start: ? — End: 2019-05-05

## 2019-05-05 MED ORDER — SODIUM CHLORIDE 0.9 % IV SOLN
INTRAVENOUS | Status: DC
Start: ? — End: 2019-05-05

## 2019-05-05 MED ORDER — GENERIC EXTERNAL MEDICATION
Status: DC
Start: ? — End: 2019-05-05

## 2019-05-05 MED ORDER — DEXAMETHASONE 2 MG PO TABS
6.00 | ORAL_TABLET | ORAL | Status: DC
Start: 2019-05-05 — End: 2019-05-05

## 2019-05-05 MED ORDER — SENNOSIDES-DOCUSATE SODIUM 8.6-50 MG PO TABS
2.00 | ORAL_TABLET | ORAL | Status: DC
Start: 2019-05-08 — End: 2019-05-05

## 2019-05-05 MED ORDER — CYCLOBENZAPRINE HCL 5 MG PO TABS
10.00 | ORAL_TABLET | ORAL | Status: DC
Start: 2019-05-05 — End: 2019-05-05

## 2019-05-05 MED ORDER — DOCUSATE SODIUM 283 MG RE ENEM
283.00 | ENEMA | RECTAL | Status: DC
Start: ? — End: 2019-05-05

## 2019-05-05 MED ORDER — ONDANSETRON HCL 4 MG/2ML IJ SOLN
4.00 | INTRAMUSCULAR | Status: DC
Start: ? — End: 2019-05-05

## 2019-05-06 DIAGNOSIS — C7951 Secondary malignant neoplasm of bone: Secondary | ICD-10-CM | POA: Diagnosis not present

## 2019-05-06 DIAGNOSIS — D61810 Antineoplastic chemotherapy induced pancytopenia: Secondary | ICD-10-CM | POA: Diagnosis not present

## 2019-05-06 DIAGNOSIS — C9002 Multiple myeloma in relapse: Secondary | ICD-10-CM | POA: Diagnosis not present

## 2019-05-06 DIAGNOSIS — G9529 Other cord compression: Secondary | ICD-10-CM | POA: Diagnosis not present

## 2019-05-06 DIAGNOSIS — R000 Tachycardia, unspecified: Secondary | ICD-10-CM | POA: Diagnosis not present

## 2019-05-06 DIAGNOSIS — G992 Myelopathy in diseases classified elsewhere: Secondary | ICD-10-CM | POA: Diagnosis not present

## 2019-05-07 DIAGNOSIS — G9529 Other cord compression: Secondary | ICD-10-CM | POA: Diagnosis not present

## 2019-05-07 DIAGNOSIS — G992 Myelopathy in diseases classified elsewhere: Secondary | ICD-10-CM | POA: Diagnosis not present

## 2019-05-07 DIAGNOSIS — I728 Aneurysm of other specified arteries: Secondary | ICD-10-CM | POA: Diagnosis not present

## 2019-05-07 DIAGNOSIS — R9431 Abnormal electrocardiogram [ECG] [EKG]: Secondary | ICD-10-CM | POA: Diagnosis not present

## 2019-05-07 DIAGNOSIS — C7951 Secondary malignant neoplasm of bone: Secondary | ICD-10-CM | POA: Diagnosis not present

## 2019-05-07 DIAGNOSIS — C9002 Multiple myeloma in relapse: Secondary | ICD-10-CM | POA: Diagnosis not present

## 2019-05-07 DIAGNOSIS — J9811 Atelectasis: Secondary | ICD-10-CM | POA: Diagnosis not present

## 2019-05-07 DIAGNOSIS — R000 Tachycardia, unspecified: Secondary | ICD-10-CM | POA: Diagnosis not present

## 2019-05-07 DIAGNOSIS — D61810 Antineoplastic chemotherapy induced pancytopenia: Secondary | ICD-10-CM | POA: Diagnosis not present

## 2019-05-07 DIAGNOSIS — J810 Acute pulmonary edema: Secondary | ICD-10-CM | POA: Diagnosis not present

## 2019-05-07 DIAGNOSIS — J90 Pleural effusion, not elsewhere classified: Secondary | ICD-10-CM | POA: Diagnosis not present

## 2019-05-08 ENCOUNTER — Encounter (HOSPITAL_COMMUNITY): Payer: Self-pay | Admitting: *Deleted

## 2019-05-08 DIAGNOSIS — C9002 Multiple myeloma in relapse: Secondary | ICD-10-CM | POA: Diagnosis not present

## 2019-05-08 DIAGNOSIS — R000 Tachycardia, unspecified: Secondary | ICD-10-CM | POA: Diagnosis not present

## 2019-05-08 DIAGNOSIS — D61810 Antineoplastic chemotherapy induced pancytopenia: Secondary | ICD-10-CM | POA: Diagnosis not present

## 2019-05-08 DIAGNOSIS — G992 Myelopathy in diseases classified elsewhere: Secondary | ICD-10-CM | POA: Diagnosis not present

## 2019-05-08 DIAGNOSIS — E039 Hypothyroidism, unspecified: Secondary | ICD-10-CM | POA: Diagnosis not present

## 2019-05-08 DIAGNOSIS — C7951 Secondary malignant neoplasm of bone: Secondary | ICD-10-CM | POA: Diagnosis not present

## 2019-05-08 DIAGNOSIS — G9529 Other cord compression: Secondary | ICD-10-CM | POA: Diagnosis not present

## 2019-05-08 MED ORDER — DEXAMETHASONE 2 MG PO TABS
2.00 | ORAL_TABLET | ORAL | Status: DC
Start: 2019-05-09 — End: 2019-05-08

## 2019-05-08 MED ORDER — POLYETHYLENE GLYCOL 3350 17 GM/SCOOP PO POWD
17.00 | ORAL | Status: DC
Start: 2019-05-08 — End: 2019-05-08

## 2019-05-08 MED ORDER — HYDROMORPHONE HCL 1 MG/ML IJ SOLN
0.20 | INTRAMUSCULAR | Status: DC
Start: ? — End: 2019-05-08

## 2019-05-08 MED ORDER — CYCLOBENZAPRINE HCL 5 MG PO TABS
10.00 | ORAL_TABLET | ORAL | Status: DC
Start: ? — End: 2019-05-08

## 2019-05-08 MED ORDER — OXYCODONE HCL 5 MG PO TABS
10.00 | ORAL_TABLET | ORAL | Status: DC
Start: ? — End: 2019-05-08

## 2019-05-08 MED ORDER — ENOXAPARIN SODIUM 40 MG/0.4ML ~~LOC~~ SOLN
40.00 | SUBCUTANEOUS | Status: DC
Start: 2019-05-12 — End: 2019-05-08

## 2019-05-08 NOTE — Progress Notes (Signed)
I received a call today from the pharmacy that dispenses her Pomalyst.  They advised that patient wasn't sure when to start taking the medication.  I have called patient and advised that when she begins her next round of chemotherapy she will start the pomalyst. She verbalizes understanding.   I have reached back out to the pharmacy and advised. They will contact patient to arrange shipment.

## 2019-05-09 DIAGNOSIS — D61810 Antineoplastic chemotherapy induced pancytopenia: Secondary | ICD-10-CM | POA: Diagnosis not present

## 2019-05-09 DIAGNOSIS — G992 Myelopathy in diseases classified elsewhere: Secondary | ICD-10-CM | POA: Diagnosis not present

## 2019-05-09 DIAGNOSIS — C9002 Multiple myeloma in relapse: Secondary | ICD-10-CM | POA: Diagnosis not present

## 2019-05-09 DIAGNOSIS — E039 Hypothyroidism, unspecified: Secondary | ICD-10-CM | POA: Diagnosis not present

## 2019-05-09 DIAGNOSIS — C7951 Secondary malignant neoplasm of bone: Secondary | ICD-10-CM | POA: Diagnosis not present

## 2019-05-09 DIAGNOSIS — R000 Tachycardia, unspecified: Secondary | ICD-10-CM | POA: Diagnosis not present

## 2019-05-09 DIAGNOSIS — G9529 Other cord compression: Secondary | ICD-10-CM | POA: Diagnosis not present

## 2019-05-10 DIAGNOSIS — D61810 Antineoplastic chemotherapy induced pancytopenia: Secondary | ICD-10-CM | POA: Diagnosis not present

## 2019-05-10 DIAGNOSIS — C9002 Multiple myeloma in relapse: Secondary | ICD-10-CM | POA: Diagnosis not present

## 2019-05-10 DIAGNOSIS — G992 Myelopathy in diseases classified elsewhere: Secondary | ICD-10-CM | POA: Diagnosis not present

## 2019-05-10 DIAGNOSIS — G9529 Other cord compression: Secondary | ICD-10-CM | POA: Diagnosis not present

## 2019-05-10 DIAGNOSIS — C7951 Secondary malignant neoplasm of bone: Secondary | ICD-10-CM | POA: Diagnosis not present

## 2019-05-10 DIAGNOSIS — R000 Tachycardia, unspecified: Secondary | ICD-10-CM | POA: Diagnosis not present

## 2019-05-10 DIAGNOSIS — E039 Hypothyroidism, unspecified: Secondary | ICD-10-CM | POA: Diagnosis not present

## 2019-05-10 MED ORDER — POLYETHYLENE GLYCOL 3350 17 GM/SCOOP PO POWD
17.00 | ORAL | Status: DC
Start: ? — End: 2019-05-10

## 2019-05-10 MED ORDER — TRAMADOL HCL 50 MG PO TABS
50.00 | ORAL_TABLET | ORAL | Status: DC
Start: ? — End: 2019-05-10

## 2019-05-10 MED ORDER — CIPROFLOXACIN HCL 500 MG PO TABS
500.00 | ORAL_TABLET | ORAL | Status: DC
Start: 2019-05-11 — End: 2019-05-10

## 2019-05-10 MED ORDER — DEXAMETHASONE 2 MG PO TABS
2.00 | ORAL_TABLET | ORAL | Status: DC
Start: 2019-05-12 — End: 2019-05-10

## 2019-05-10 MED ORDER — LACTULOSE 10 GM/15ML PO SOLN
20.00 | ORAL | Status: DC
Start: ? — End: 2019-05-10

## 2019-05-10 MED ORDER — OXYCODONE HCL 5 MG PO TABS
10.00 | ORAL_TABLET | ORAL | Status: DC
Start: ? — End: 2019-05-10

## 2019-05-11 ENCOUNTER — Other Ambulatory Visit (HOSPITAL_COMMUNITY): Payer: 59

## 2019-05-11 ENCOUNTER — Telehealth: Payer: Self-pay | Admitting: Radiation Oncology

## 2019-05-11 ENCOUNTER — Ambulatory Visit: Payer: Self-pay | Admitting: Urology

## 2019-05-11 DIAGNOSIS — C9002 Multiple myeloma in relapse: Secondary | ICD-10-CM | POA: Diagnosis not present

## 2019-05-11 DIAGNOSIS — C7951 Secondary malignant neoplasm of bone: Secondary | ICD-10-CM | POA: Diagnosis not present

## 2019-05-11 DIAGNOSIS — R000 Tachycardia, unspecified: Secondary | ICD-10-CM | POA: Diagnosis not present

## 2019-05-11 DIAGNOSIS — G9529 Other cord compression: Secondary | ICD-10-CM | POA: Diagnosis not present

## 2019-05-11 DIAGNOSIS — D61810 Antineoplastic chemotherapy induced pancytopenia: Secondary | ICD-10-CM | POA: Diagnosis not present

## 2019-05-11 DIAGNOSIS — E039 Hypothyroidism, unspecified: Secondary | ICD-10-CM | POA: Diagnosis not present

## 2019-05-11 NOTE — Telephone Encounter (Signed)
-----   Message from [place], [place] sent at 05/05/2019 12:34 PM EST ----- Regarding: RE: a faxed we got from [facility] Per Dr. Tomasa Blase note in my chart from today, based on discussions with the medical oncology team there at [facility], the plan is for her to remain inpatient for an additional 5 to 6 days so he is planning to move forward with treating the lumbar spine with a 5 fraction course of radiation beginning today and completing on Thursday in anticipation of her discharge on Thursday or Friday next week.  Therefore, lets move forward as previously discussed with a reconsult visit via [facility] on Friday, 05/12/2019 at 9:30 AM to discuss postop radiotherapy to the thoracic spine and a CT Sim appointment to follow at 10 AM. Thank you, -Ash[facility] ----- Message ----- From: Heywood Footman, RN Sent: 05/05/2019   9:45 AM EST To: Tyler Pita, MD, Freeman Caldron, PA-C, # Subject: FW: a faxed we got from [facility]            I just hung up with Dr. Wyline Mood. Definitely not trying to over step. Dr. Wyline Mood called me. He reports the patient's case just became slightly more complicated.   He explains the patient came in with leg weakness but good strength. 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 Neurosurgery got involved. Originally they planned to decompress T6 and operate on the lumbar spine. Dr. Wyline Mood said when they got in there neurosurgery didn't see a need to operate on the lumbar spine.   [name] is booked solid on Tuesday. I relayed the intended appointment for Friday even though I figured he would want [place] to see her sooner. Dr. Wyline Mood feels like she needs to be addressed no later than Tuesday which I expected. I told him I would reach out to see what we could do. He said he may go ahead and get rolling with her lumbar spine and let us take care of her thoracic but doesn't feel what he would offer is the best.   Dr. Wyline Mood said if Ashlyn or Dr. [name]  would like to reach out to him he would be most willing to discuss the patient with them. His direct number is [phone].   Sam ----- Message ----- From: Freeman Caldron, PA-C Sent: 05/05/2019   9:20 AM EST To: Tyler Pita, MD, Joette Catching, # Subject: RE: a faxed we got from [facility] the 1 month follow up visit on my schedule for 05/11/19 and schedule a re-consult visit (MyChart) on Friday 05/12/19 at 9:30am to discuss post-op XRT (recently had surgery at [facility] for surgical decompression/laminectomy of epidural disease). Ailene Ards ----- Message ----- From: Joette Catching Sent: 05/05/2019   8:46 AM EST To: Freeman Caldron, PA-C Subject: a faxed we got from [place]- this pt has a follow up on 05-11-19, but we got medical records on the pt from [facility] ,they are under the media as referral 1/21 [facility] can you look at this and let Suanne Marker know what to do.   Thanks  [employer]

## 2019-05-11 NOTE — Telephone Encounter (Signed)
At the request of Freeman Caldron, PA-C this RN phoned the patient. Patient explains she is being discharged home later today. Patient confirms she is feeling much better. Offered simulation tomorrow or one day next week allowing time for her to get home and settled. Patient requested simulation tomorrow and verbalizes her desire to start treatment right away. Instructed patient to arrive tomorrow, Friday, January 15th at 0930. Patient verbalized understanding and expressed appreciation for the call. Staff informed of patient's wishes.

## 2019-05-12 ENCOUNTER — Encounter: Payer: Self-pay | Admitting: Urology

## 2019-05-12 ENCOUNTER — Ambulatory Visit
Admission: RE | Admit: 2019-05-12 | Discharge: 2019-05-12 | Disposition: A | Discharge status: Home / Self Care | Payer: 59 | Source: Ambulatory Visit | Attending: Radiation Oncology | Admitting: Radiation Oncology

## 2019-05-12 ENCOUNTER — Other Ambulatory Visit: Payer: Self-pay

## 2019-05-12 VITALS — BP 107/59 | HR 119 | Temp 98.5°F | Resp 18 | Ht 62.0 in | Wt 123.0 lb

## 2019-05-12 DIAGNOSIS — C9002 Multiple myeloma in relapse: Secondary | ICD-10-CM

## 2019-05-12 DIAGNOSIS — G893 Neoplasm related pain (acute) (chronic): Secondary | ICD-10-CM | POA: Diagnosis not present

## 2019-05-12 DIAGNOSIS — M25519 Pain in unspecified shoulder: Secondary | ICD-10-CM | POA: Insufficient documentation

## 2019-05-12 DIAGNOSIS — M4856XA Collapsed vertebra, not elsewhere classified, lumbar region, initial encounter for fracture: Secondary | ICD-10-CM | POA: Diagnosis not present

## 2019-05-12 DIAGNOSIS — C7951 Secondary malignant neoplasm of bone: Secondary | ICD-10-CM | POA: Diagnosis not present

## 2019-05-12 DIAGNOSIS — Z808 Family history of malignant neoplasm of other organs or systems: Secondary | ICD-10-CM | POA: Diagnosis not present

## 2019-05-12 DIAGNOSIS — Z923 Personal history of irradiation: Secondary | ICD-10-CM | POA: Insufficient documentation

## 2019-05-12 DIAGNOSIS — Z79899 Other long term (current) drug therapy: Secondary | ICD-10-CM | POA: Insufficient documentation

## 2019-05-12 DIAGNOSIS — Z510 Encounter for antineoplastic radiation therapy: Secondary | ICD-10-CM | POA: Insufficient documentation

## 2019-05-12 DIAGNOSIS — Z792 Long term (current) use of antibiotics: Secondary | ICD-10-CM | POA: Diagnosis not present

## 2019-05-12 DIAGNOSIS — R200 Anesthesia of skin: Secondary | ICD-10-CM | POA: Insufficient documentation

## 2019-05-12 DIAGNOSIS — R531 Weakness: Secondary | ICD-10-CM | POA: Insufficient documentation

## 2019-05-12 DIAGNOSIS — E039 Hypothyroidism, unspecified: Secondary | ICD-10-CM | POA: Insufficient documentation

## 2019-05-12 DIAGNOSIS — E079 Disorder of thyroid, unspecified: Secondary | ICD-10-CM | POA: Diagnosis not present

## 2019-05-12 DIAGNOSIS — M720 Palmar fascial fibromatosis [Dupuytren]: Secondary | ICD-10-CM | POA: Diagnosis not present

## 2019-05-12 DIAGNOSIS — G629 Polyneuropathy, unspecified: Secondary | ICD-10-CM | POA: Insufficient documentation

## 2019-05-12 DIAGNOSIS — Z981 Arthrodesis status: Secondary | ICD-10-CM | POA: Diagnosis not present

## 2019-05-12 DIAGNOSIS — R590 Localized enlarged lymph nodes: Secondary | ICD-10-CM | POA: Diagnosis not present

## 2019-05-12 DIAGNOSIS — M8458XD Pathological fracture in neoplastic disease, other specified site, subsequent encounter for fracture with routine healing: Secondary | ICD-10-CM | POA: Diagnosis not present

## 2019-05-12 DIAGNOSIS — Z483 Aftercare following surgery for neoplasm: Secondary | ICD-10-CM | POA: Diagnosis not present

## 2019-05-12 DIAGNOSIS — K8020 Calculus of gallbladder without cholecystitis without obstruction: Secondary | ICD-10-CM | POA: Diagnosis not present

## 2019-05-12 DIAGNOSIS — G9520 Unspecified cord compression: Secondary | ICD-10-CM | POA: Diagnosis not present

## 2019-05-12 DIAGNOSIS — D61810 Antineoplastic chemotherapy induced pancytopenia: Secondary | ICD-10-CM | POA: Diagnosis not present

## 2019-05-12 NOTE — Progress Notes (Signed)
  Radiation Oncology         [phone] ________________________________  Name: Melissa [name] MRN: [id number]  Date: 05/12/2019  DOB: 10-31-1955  SIMULATION AND TREATMENT PLANNING NOTE    ICD-10-CM   1. Multiple myeloma in relapse ([place])  C90.02     DIAGNOSIS:  64 y.o. patient with spinal multiple myeloma s/p decompression/stabilization at T6  NARRATIVE:  The patient was brought to the [facility].  Identity was confirmed.  All relevant records and images related to the planned course of therapy were reviewed.  The patient freely provided informed written consent to proceed with treatment after reviewing the details related to the planned course of therapy. The consent form was witnessed and verified by the simulation staff.  Then, the patient was set-up in a stable reproducible  supine position for radiation therapy.  CT images were obtained.  Surface markings were placed.  The CT images were loaded into the planning software.  Then the target and avoidance structures were contoured including kidneys.  Treatment planning then occurred.  The radiation prescription was entered and confirmed.  Then, I designed and supervised the construction of a total of 3 medically necessary complex treatment devices with VacLoc positioner and 2 MLCs to shield kidneys.  I have requested : 3D Simulation  I have requested a DVH of the following structures: Left Kidney, Right Kidney and target.  PLAN:  The patient will receive 20 Gy in 10 fractions.  ________________________________  Sheral Apley [name], M.D.

## 2019-05-12 NOTE — Progress Notes (Signed)
Radiation Oncology         [phone] ________________________________  Outpatient Re-Consultation  Name: Melissa [name] MRN: [id number]  Date: 05/12/2019  DOB: 1956-01-03  VZ:DGLOVF, Elayne Snare, MD  Kathyrn Drown, MD   REFERRING PHYSICIAN: Kathyrn Drown, MD  DIAGNOSIS: 64 y.o. woman with thoracic cord compression and low back pain secondary to progression of her recurrent multiple myeloma.    ICD-10-CM   1. Secondary malignant neoplasm of spinal column (HCC)  C79.51   2. Multiple myeloma in relapse [facility])  C90.02     HISTORY OF PRESENT ILLNESS: Melissa [name] is a 64 y.o. female with a diagnosis of recurrent multiple myeloma. She was initially diagnosed with multiple myeloma on 10/12/08, presenting with right-sided upper back pain. She received palliative radiation therapy here to T1 - T7 and was referred to [facility] on 11/12/2008 for consultation regarding possible role of autologous stem cell transplantation and the overall management of her disease. She was subsequently placed on Velcade and completed 4 cycles, to which she had an excellent response. She was found to be a good candidate for stem cell transplant, which took place on 03/06/2009. Following the transplant, her hospital course was complicated by significant mucositis with fevers and neutropenia. Repeat bone marrow biopsy on 05/08/2009 showed no evidence of disease.She continued her care locally under the care and direction of the [facility] medical oncology team, with maintenance Revlimid which was discontinued in 01/2013 secondary to intolerance from low blood counts. She continued under observation only, most recently with Dr. Delton Coombes as well as continuing with periodic follow up at [facility] with Dr. Tiana Loft. A repeat bone marrow biopsy on 04/07/2017 remained negative for any residual or recurrent myeloma so she continued in observation only.   Surveillance labs on 02/08/2019 showed resurgence of M  spike and elevated free light chain ratio with elevated kappa light chains, indicating disease recurrance. She underwent PET scan on 03/10/2019, which revealed extensive patchy osseous hypermetabolism throughout the axial and proximal appendicular skeleton involving the spine, sternum, bilateral ribs, clavicles, shoulders and pelvic girdle, compatible with widespread metabolically active multiple myeloma.  Repeat labs at [facility] on 03/13/2019 were further elevated so she proceeded to repeat bone marrow biopsy on 03/14/2019 with final pathology confirming recurrent myeloma.  She developed progressive left arm/shoulder pain and weakness, rating a 9/10 in severity as well as some numbness in the chin area and left arm associated with weakness in the left hand. This was further evaluated with an MRI of the cervical and thoracic spine on 03/21/2019, which showed diffusely abnormal marrow signal reflecting multiple myeloma with dorsal and left lateral epidural disease at C7-T1 and T1-T2 resulting in moderate stenosis without cord compression and no acute compression deformity.  Additionally noted was a chronic T6 compression deformity with mild osseous retropulsion.  We saw her in the clinic for consultation on 03/22/19 and she elected to proceed with a 2 week course of daily radiotherapy to the left shoulder, C7 - T2 and right hip which was completed on 04/07/19 and tolerated very well with significant improvement in her pain and paraesthesias.  Recently, she presented to her PCP with increased back pain and left arm and leg weakness making it difficult to ambulate. Lumbar spine MRI was performed on 05/02/2019 and showed new mild compression fracture of superior endplate of I43.  There was also extensive epidural tumor at multiple levels, most notable centrally and to the right posterior to L4 and L5,  encroaching on descending and exiting right L4 root and L5 root at the respective levels and also abuts descending left  S1 root with pathologically enlarged right external iliac lymph node.  With this information, she was referred to the ED at [facility] for further evaluation and work up. She was admitted to the hospital on 1/6/2021and underwent total spine MRI's the following day. Cervical spine MRI showed interval response to therapy and otherwise similar findings. Thoracic spine MRI revealed interval development of right eccentric dorsal epidural mass at T6 extending into the right T6-T7 neural foramen with mild retropulsion of remote T6 fracture contributing to moderately severe spinal canal stenosis with effacement of CSF and moderate spinal cord compression.  Additionally, there was associated dorsal and ventral paraspinous enhancing masses with nodular enhancement along bilateral posterior pleural margins of the lung bases consistent with disease progression. Lumbar spine MRI remained similar to prior exam from 05/01/18 with diffuse osseous myelomatous involvement with multifocal regions of epidural tumor extension exerting mass-effect on the descending and exiting lumbosacral nerve roots as well as remote L3, L4, and L5 compression fractures involving the superior endplates.  Neurosurgery was consulted and she underwent urgent decompression with laminectomy and facetectomy for decompression of neural elements at T6 with complete resection of epidural metastasis and posterior spinal fusion from T4-8 for stabilization under the care and direction of Dr. Atilano Ina. During her hospital stay, she also received 5 fractions of palliative radiation therapy to the epidural disease at L1, L3-4, L5, and S1-3 under the care and direction of Dr. Danise Edge. Post-op radiation therapy to the thoracic spine was recommended and she was discharged home with HHPT on 05/11/2019.  She reports that she has had significant improvement in the back pain as well as the left sided weakness and paresthesias.  She is currently on a 7 day steroid  taper and feels that her pain remains well controlled with Percocet.  She denies any drainage or bleeding from her incision site.  She has been kindly referred back to [place] for discussion of postoperative palliative radiation therapy to the thoracic spine.  PREVIOUS RADIATION THERAPY: Yes  05/05/2019: Lumbar spine and sacral metastases / 20 Gy in 5 fractions (Dr. Cristy Hilts)  03/27/19-04/07/19:  1. Left scapula and C7 - T2 / 20 Gy in 10 fractions 2. Right hip / 20 Gy in 10 fractions  10/2008: Palliative radiotherapy to T1-T7 [name])  PAST MEDICAL HISTORY:  Past Medical History:  Diagnosis Date  . History of stem cell transplant ([place])    for multiple myeloma  . Hypothyroidism   . Multiple myeloma   . Peripheral neuropathy   . Seasonal allergies   . Thyroid disease       PAST SURGICAL HISTORY: Past Surgical History:  Procedure Laterality Date  . CATARACT EXTRACTION W/PHACO Right 04/04/2013   Procedure: CATARACT EXTRACTION PHACO AND INTRAOCULAR LENS PLACEMENT (IOC);  Surgeon: Elta Guadeloupe T. Gershon Crane, MD;  Location: [facility];  Service: Ophthalmology;  Laterality: Right;  CDE:3.42  . CATARACT EXTRACTION W/PHACO Left 04/18/2013   Procedure: CATARACT EXTRACTION PHACO AND INTRAOCULAR LENS PLACEMENT (IOC);  Surgeon: Elta Guadeloupe T. Gershon Crane, MD;  Location: [facility];  Service: Ophthalmology;  Laterality: Left;  CDE:4.91  . CESAREAN SECTION  06/1993  . FASCIECTOMY Left 03/02/2017   Procedure: FASCIECTOMY, LEFT SMALL FINGER;  Surgeon: Daryll Brod, MD;  Location: [facility];  Service: Orthopedics;  Laterality: Left;  . HAND SURGERY Left   . TONSILECTOMY, ADENOIDECTOMY, BILATERAL MYRINGOTOMY AND TUBES    .  TONSILLECTOMY    . TUBAL LIGATION    . YAG LASER APPLICATION Right 1/93/7902   Procedure: YAG LASER APPLICATION;  Surgeon: Rutherford Guys, MD;  Location: [facility];  Service: Ophthalmology;  Laterality: Right;  . YAG LASER APPLICATION Left 08/04/7351   Procedure: YAG LASER APPLICATION;  Surgeon: Rutherford Guys, MD;  Location: [facility];  Service: Ophthalmology;  Laterality: Left;    FAMILY HISTORY:  Family History  Problem Relation Age of Onset  . Hypertension Mother   . Heart attack Mother   . Diabetes Mother   . Skin cancer Mother   . Hypertension Father   . Heart attack Father   . Skin cancer Father   . Heart disease Maternal Grandmother   . Stroke Maternal Grandfather   . Hypertension Sister   . Skin cancer Sister   . Hypertension Brother   . Skin cancer Brother   . Hypertension Brother   . Cancer Brother        melanoma  . Skin cancer Brother   . Factor V Leiden deficiency Daughter     SOCIAL HISTORY:  Social History   Socioeconomic History  . Marital status: Married    Spouse name: Not on file  . Number of children: Not on file  . Years of education: Not on file  . Highest education level: Not on file  Occupational History  . Not on file  Tobacco Use  . Smoking status: Never Smoker  . Smokeless tobacco: Never Used  Substance and Sexual Activity  . Alcohol use: Yes    Comment: occ  . Drug use: No  . Sexual activity: Not Currently    Birth control/protection: Post-menopausal  Other Topics Concern  . Not on file  Social History Narrative  . Not on file   Social Determinants of Health   Financial Resource Strain:   . Difficulty of Paying Living Expenses: Not on file  Food Insecurity:   . Worried About Charity fundraiser in the Last Year: Not on file  . Ran Out of Food in the Last Year: Not on file  Transportation Needs:   . Lack of Transportation (Medical): Not on file  . Lack of Transportation (Non-Medical): Not on file  Physical Activity:   . Days of Exercise per Week: Not on file  . Minutes of Exercise per Session: Not on file  Stress:   . Feeling of Stress : Not on file  Social Connections:   . Frequency of Communication with Friends and Family: Not on file  . Frequency of Social Gatherings with Friends and Family: Not on file  . Attends  Religious Services: Not on file  . Active Member of Clubs or Organizations: Not on file  . Attends Archivist Meetings: Not on file  . Marital Status: Not on file  Intimate Partner Violence:   . Fear of Current or Ex-Partner: Not on file  . Emotionally Abused: Not on file  . Physically Abused: Not on file  . Sexually Abused: Not on file    ALLERGIES: Hydromorphone hcl, Poison ivy extract [poison ivy extract], and Tape  MEDICATIONS:  Current Outpatient Medications  Medication Sig Dispense Refill  . acyclovir (ZOVIRAX) 400 MG tablet Take 1 tablet (400 mg total) by mouth 2 (two) times daily. 60 tablet 3  . allopurinol (ZYLOPRIM) 300 MG tablet Take by mouth.    . Calcium Carbonate-Vitamin D (CALCIUM + D PO) Take 1 tablet by mouth daily.    . Cholecalciferol (  VITAMIN D) 2000 UNITS tablet Take 2,000 Units by mouth daily.    . ciprofloxacin (CIPRO) 500 MG tablet Take by mouth.    . dexamethasone (DECADRON) 1 MG tablet Take by mouth.    Marland Kitchen ibuprofen (ADVIL) 200 MG tablet Take by mouth.    Marland Kitchen ibuprofen (ADVIL,MOTRIN) 200 MG tablet Take 200 mg by mouth as needed.    Marland Kitchen levothyroxine (SYNTHROID, LEVOTHROID) 75 MCG tablet Take 1 tablet (75 mcg total) by mouth daily. 90 tablet 3  . oxyCODONE-acetaminophen (PERCOCET) 10-325 MG tablet Take 1 tablet by mouth every 8 (eight) hours as needed for pain. 30 tablet 0  . pantoprazole (PROTONIX) 40 MG tablet Take by mouth.    . pomalidomide (POMALYST) 2 MG capsule Take 1 capsule (2 mg total) by mouth daily. For 21 days and then take 7 days off. 21 capsule 0  . prochlorperazine (COMPAZINE) 10 MG tablet Take 1 tablet (10 mg total) by mouth every 6 (six) hours as needed for nausea or vomiting. 45 tablet 1  . triamcinolone cream (KENALOG) 0.1 % Apply BID to affected areas 30 g 1  . acyclovir (ZOVIRAX) 400 MG tablet Take by mouth.    . ASPIRIN 81 PO Take 1 tablet by mouth daily.    . Carfilzomib (KYPROLIS IV) Inject into the vein.    . carfilzomib  (KYPROLIS) 60 MG SOLR Inject into the vein.    Marland Kitchen dexamethasone (DECADRON) 4 MG tablet Take 10 tablets (40 mg total) by mouth once a week. (Patient not taking: Reported on 05/12/2019) 40 tablet 2  . magic mouthwash w/lidocaine SOLN Take 15 mLs by mouth every 4 (four) hours as needed for mouth pain. (Patient not taking: Reported on 04/18/2019) 480 mL 2  . magic mouthwash w/lidocaine SOLN Take 15 mLs by mouth 4 (four) times daily. (Patient not taking: Reported on 04/18/2019) 480 mL 2  . oxyCODONE-acetaminophen (PERCOCET) 10-325 MG tablet Take by mouth.    . prochlorperazine (COMPAZINE) 10 MG tablet Take by mouth.     No current facility-administered medications for this encounter.   Facility-Administered Medications Ordered in Other Encounters  Medication Dose Route Frequency Provider Last Rate Last Admin  . 0.9 %  sodium chloride infusion   Intravenous Continuous Baird Cancer, PA-C 20 mL/hr at 12/05/10 0947 New Bag at 12/05/10 0947  . 0.9 %  sodium chloride infusion   Intravenous Continuous Everardo All, MD 20 mL/hr at 02/26/11 1528 New Bag at 02/26/11 1528  . sodium chloride 0.9 % injection 10 mL  10 mL Intravenous PRN Everardo All, MD   10 mL at 02/26/11 1518    REVIEW OF SYSTEMS:  On review of systems, the patient reports that she is doing well overall and recovering in standard fashion. She denies any chest pain, shortness of breath, cough, fevers, chills, night sweats, or unintended weight changes. She denies any bowel or bladder disturbances, and denies abdominal pain, nausea or vomiting. She denies any new skin lesions or concerns. She continues with back pain and leg weakness but improved status post recent surgery and lumbar spine radiation.  She reports full sensation has returned in the left upper and lower extremities.  A complete review of systems is obtained and is otherwise negative.  PHYSICAL EXAM:  Wt Readings from Last 3 Encounters:  05/12/19 123 lb (55.8 kg)  04/18/19  131 lb 12.8 oz (59.8 kg)  04/11/19 133 lb 6.4 oz (60.5 kg)   Temp Readings from Last 3 Encounters:  05/12/19 98.5 F (  36.9 C) (Temporal)  04/19/19 (!) 96.9 F (36.1 C) (Temporal)  04/18/19 (!) 97.5 F (36.4 C) (Temporal)   BP Readings from Last 3 Encounters:  05/12/19 (!) 107/59  04/19/19 127/62  04/18/19 (P) 116/60   Pulse Readings from Last 3 Encounters:  05/12/19 (!) 119  04/19/19 (!) 102  04/18/19 (P) 99    /10 In general this is a well appearing Caucasian female in no acute distress. She's alert and oriented x4 and appropriate throughout the examination. Cardiopulmonary assessment is negative for acute distress and she exhibits normal effort.  Her midline incision along the upper thoracic spine is clean/dry/intact with staple closure and appears to be healing appropriately without any signs of infection.  There is no drainage, bleeding or erythema.  Sensation is intact to light touch in the upper and lower extremities bilaterally.  Strength is 5/5 and equal bilaterally in the upper extremities, strength is 4/5 in the left lower extremity and 5/5 in the right lower extremity.  She appears grossly neurologically intact.  KPS = 70  100 - Normal; no complaints; no evidence of disease. 90   - Able to carry on normal activity; minor signs or symptoms of disease. 80   - Normal activity with effort; some signs or symptoms of disease. 64   - Cares for self; unable to carry on normal activity or to do active work. 60   - Requires occasional assistance, but is able to care for most of his personal needs. 50   - Requires considerable assistance and frequent medical care. 70   - Disabled; requires special care and assistance. 61   - Severely disabled; hospital admission is indicated although death not imminent. 67   - Very sick; hospital admission necessary; active supportive treatment necessary. 10   - Moribund; fatal processes progressing rapidly. 0     - Dead  Karnofsky DA, Abelmann  [place], Craver LS and Burchenal [facility] [phone]) The use of the nitrogen mustards in the palliative treatment of carcinoma: with particular reference to bronchogenic carcinoma Cancer 1 634-56  LABORATORY DATA:  Lab Results  Component Value Date   WBC 1.5 (L) 04/25/2019   HGB 8.5 (L) 04/25/2019   HCT 25.8 (L) 04/25/2019   MCV 101.2 (H) 04/25/2019   PLT 49 (L) 04/25/2019   Lab Results  Component Value Date   NA 138 04/25/2019   K 4.4 04/25/2019   CL 103 04/25/2019   CO2 24 04/25/2019   Lab Results  Component Value Date   ALT 27 04/25/2019   AST 28 04/25/2019   ALKPHOS 118 04/25/2019   BILITOT 0.8 04/25/2019     RADIOGRAPHY: MR Lumbar Spine W Wo Contrast  Result Date: 05/02/2019 CLINICAL DATA:  Low back pain and weakness in a patient with a history of multiple myeloma. EXAM: MRI LUMBAR SPINE WITHOUT AND WITH CONTRAST TECHNIQUE: Multiplanar and multiecho pulse sequences of the lumbar spine were obtained without and with intravenous contrast. CONTRAST:  7 mL GADAVIST IV COMPARISON:  MRI thoracic spine 03/21/2019. PET CT scan 03/10/2019. MRI lumbar spine 10/10/2008. FINDINGS: Segmentation:  Standard. Alignment:  Maintained. Vertebrae: Marrow signal is diffusely abnormal consistent with the patient's history of multiple myeloma. Remote superior endplate compression fracture of L3 is unchanged since 2010. A mild superior endplate compression fracture of L4 is seen with vertebral body height loss of up to approximately 10%. There is also a superior endplate compression fracture of L5 with vertebral body height loss of approximately 30%. Finally, there is a  very mild inferior endplate compression fracture of L1 with vertebral body height loss of 5-10% which is new since the prior thoracic spine MRI. The extent of marrow signal abnormality makes evaluation for the age of these fractures difficult. Conus medullaris and cauda equina: Conus extends to the L1 level. Conus and cauda equina appear normal. Paraspinal  and other soft tissues: 1 cm stone in the gallbladder is identified. A 1.3 cm right iliac lymph node is identified on image 35 of the axial series. Disc levels: Multiple foci of epidural tumor identified as detailed below. T11-12: Imaged in the sagittal plane only. Mild disc bulge. No stenosis. T12-L1: Negative. L1/L1-2. Small focus of right paracentral epidural tumor without stenosis. No disc bulge or protrusion at L1-2. L2-3: Shallow disc bulge without stenosis. L3: Left paracentral epidural tumor extends throughout the length of the vertebral body and measures 0.8 cm transverse by 0.5 cm AP. Tumor could impact the descending left L3 root. The central canal and foramina are open. L3-4: Shallow disc bulge without stenosis. L4: Epidural tumor is seen centrally and eccentric to the left throughout the length of the L4 vertebral body measuring up to 2.1 cm transverse by 0.7 cm AP. Epidural tumor results in narrowing in the right subarticular recess and encroachment on the descending right L4 root. L4-5: There is a shallow disc bulge without central canal stenosis. Epidural tumor posterior to L4 extends into the right foramen causing foraminal narrowing and impingement on the exiting right L4 root. The left foramen is open. L5: Left subarticular recess epidural tumor extends into the left foramen at L5-S1. Tumor deposit measures approximately 1.8 cm transverse by 0.7 cm AP and extends throughout the length of the L5 vertebral body. Tumor results in impingement on the descending and exiting left L5 root. The right foramen is open. L5-S1: Minimal disc bulge without central canal stenosis. The right foramen is open. Epidural tumor in the left foramen again noted. S1-2: Epidural tumor is seen about the descending and exiting left S1 root. IMPRESSION: Diffuse marrow signal abnormality consistent multiple myeloma. Mild compression fracture of the superior endplate of T88 is new since the comparison thoracic spine MRI in  November, 2020. Remote L3 superior endplate compression fracture is identified. The age of mild L4 and L5 superior endplate compression fractures is difficult to determine given the extent of marrow signal abnormality. Extensive epidural tumor at multiple levels as detailed above is most notable centrally and to the right posterior to L4 encroaches on the descending and exiting right L4 root and centrally and to the left posterior to L5 encroaches on the descending and exiting left L5 root. Tumor also abuts the descending left S1 root. Please see above for descriptions of individual levels. Pathologically enlarged right external iliac lymph node. Gallstone without evidence of cholecystitis. Electronically Signed   By: Inge Rise M.D.   On: 05/02/2019 12:56      IMPRESSION/PLAN: 1. 64 y.o. woman s/p recent urgent thoracic surgical decompression for cord compression at T6 and low back pain secondary to progression of her recurrent multiple myeloma in the thoracic and lumbar spine.  Today, we talked to the patient about the findings and workup thus far. We discussed the natural history of recurrent multiple myeloma and general treatment, highlighting the role of palliative radiotherapy in the management of symptomatic lesions. We discussed the available radiation techniques, and focused on the details and logistics of delivery. The recommendation is to proceed with a 2 week course of daily postoperative radiotherapy  to the surgical site at T4 - T8.  We reviewed the anticipated acute and late sequelae associated with radiation in this setting. The patient was encouraged to ask questions that were answered to her satisfaction. She appears to have a good understanding of her disease and our recommendations which are of palliative intent.  At the conclusion of our conversation the patient is interested in moving forward with a 2 week course of daily postop radiotherapy to the thoracic spine.  She has freely  signed written consent to proceed today in the office and a copy of this document has been placed in her medical record.  She will have CT SIM today, following our visit, in anticipation of beginning her course of radiation on 05/22/19.  She is on a 7 day steroid taper which is appropriate.  She will continue taking pain medications as needed and advised to take prior to her daily radiation visits. She prefers to continue with radiation here locally but is interested in transferring her care to the [facility] for continued management of her systemic disease going forward. She has a follow up appointment at the [facility] on 05/18/19. We will share this discussion with Dr. Delton Coombes and her treatment team at [facility] and move forward with treatment planning accordingly. She will also continue in follow up with neurosurgery for postoperative evaluation.    Nicholos Johns, PA-C    Tyler Pita, MD  [facility] Direct Dial: [phone]  Fax: [phone] [email or web address]  Skype  [email or web address]   This document serves as a record of services personally performed by Tyler Pita, MD and Freeman Caldron, PA-C. It was created on their behalf by Wilburn Mylar, a trained medical scribe. The creation of this record is based on the scribe's personal observations and the provider's statements to them. This document has been checked and approved by the attending provider.

## 2019-05-15 ENCOUNTER — Encounter (HOSPITAL_COMMUNITY): Payer: Self-pay | Admitting: *Deleted

## 2019-05-15 NOTE — Progress Notes (Signed)
I reached out to patient this morning post discharge to see if she has set up delivery of Pomalyst and to see if she needed anything from me.  She reports that she has been better but is doing okay.  She did inform me that she is transferring care to [facility]. At this time she does not need anything from [facility].  Should that change, I advised her to give our office a call.

## 2019-05-16 DIAGNOSIS — Z792 Long term (current) use of antibiotics: Secondary | ICD-10-CM | POA: Diagnosis not present

## 2019-05-16 DIAGNOSIS — C9002 Multiple myeloma in relapse: Secondary | ICD-10-CM | POA: Diagnosis not present

## 2019-05-16 DIAGNOSIS — Z483 Aftercare following surgery for neoplasm: Secondary | ICD-10-CM | POA: Diagnosis not present

## 2019-05-16 DIAGNOSIS — K8020 Calculus of gallbladder without cholecystitis without obstruction: Secondary | ICD-10-CM | POA: Diagnosis not present

## 2019-05-16 DIAGNOSIS — E039 Hypothyroidism, unspecified: Secondary | ICD-10-CM | POA: Diagnosis not present

## 2019-05-16 DIAGNOSIS — M8458XD Pathological fracture in neoplastic disease, other specified site, subsequent encounter for fracture with routine healing: Secondary | ICD-10-CM | POA: Diagnosis not present

## 2019-05-16 DIAGNOSIS — M720 Palmar fascial fibromatosis [Dupuytren]: Secondary | ICD-10-CM | POA: Diagnosis not present

## 2019-05-16 DIAGNOSIS — Z981 Arthrodesis status: Secondary | ICD-10-CM | POA: Diagnosis not present

## 2019-05-16 DIAGNOSIS — D61810 Antineoplastic chemotherapy induced pancytopenia: Secondary | ICD-10-CM | POA: Diagnosis not present

## 2019-05-18 ENCOUNTER — Ambulatory Visit (HOSPITAL_COMMUNITY): Payer: 59 | Admitting: Hematology

## 2019-05-18 DIAGNOSIS — C9002 Multiple myeloma in relapse: Secondary | ICD-10-CM | POA: Diagnosis not present

## 2019-05-18 DIAGNOSIS — R590 Localized enlarged lymph nodes: Secondary | ICD-10-CM | POA: Diagnosis not present

## 2019-05-18 DIAGNOSIS — Z510 Encounter for antineoplastic radiation therapy: Secondary | ICD-10-CM | POA: Diagnosis not present

## 2019-05-18 DIAGNOSIS — M25519 Pain in unspecified shoulder: Secondary | ICD-10-CM | POA: Diagnosis not present

## 2019-05-18 DIAGNOSIS — C7951 Secondary malignant neoplasm of bone: Secondary | ICD-10-CM | POA: Diagnosis not present

## 2019-05-18 DIAGNOSIS — Z7982 Long term (current) use of aspirin: Secondary | ICD-10-CM | POA: Diagnosis not present

## 2019-05-18 DIAGNOSIS — Z79899 Other long term (current) drug therapy: Secondary | ICD-10-CM | POA: Diagnosis not present

## 2019-05-18 DIAGNOSIS — E039 Hypothyroidism, unspecified: Secondary | ICD-10-CM | POA: Diagnosis not present

## 2019-05-18 DIAGNOSIS — Z9484 Stem cells transplant status: Secondary | ICD-10-CM | POA: Diagnosis not present

## 2019-05-18 DIAGNOSIS — B001 Herpesviral vesicular dermatitis: Secondary | ICD-10-CM | POA: Diagnosis not present

## 2019-05-18 DIAGNOSIS — G629 Polyneuropathy, unspecified: Secondary | ICD-10-CM | POA: Diagnosis not present

## 2019-05-18 DIAGNOSIS — M4856XA Collapsed vertebra, not elsewhere classified, lumbar region, initial encounter for fracture: Secondary | ICD-10-CM | POA: Diagnosis not present

## 2019-05-18 DIAGNOSIS — R531 Weakness: Secondary | ICD-10-CM | POA: Diagnosis not present

## 2019-05-18 DIAGNOSIS — R200 Anesthesia of skin: Secondary | ICD-10-CM | POA: Diagnosis not present

## 2019-05-19 DIAGNOSIS — E039 Hypothyroidism, unspecified: Secondary | ICD-10-CM | POA: Diagnosis not present

## 2019-05-19 DIAGNOSIS — K8020 Calculus of gallbladder without cholecystitis without obstruction: Secondary | ICD-10-CM | POA: Diagnosis not present

## 2019-05-19 DIAGNOSIS — M8458XD Pathological fracture in neoplastic disease, other specified site, subsequent encounter for fracture with routine healing: Secondary | ICD-10-CM | POA: Diagnosis not present

## 2019-05-19 DIAGNOSIS — Z483 Aftercare following surgery for neoplasm: Secondary | ICD-10-CM | POA: Diagnosis not present

## 2019-05-19 DIAGNOSIS — C9002 Multiple myeloma in relapse: Secondary | ICD-10-CM | POA: Diagnosis not present

## 2019-05-19 DIAGNOSIS — M720 Palmar fascial fibromatosis [Dupuytren]: Secondary | ICD-10-CM | POA: Diagnosis not present

## 2019-05-19 DIAGNOSIS — D61810 Antineoplastic chemotherapy induced pancytopenia: Secondary | ICD-10-CM | POA: Diagnosis not present

## 2019-05-19 DIAGNOSIS — Z792 Long term (current) use of antibiotics: Secondary | ICD-10-CM | POA: Diagnosis not present

## 2019-05-19 DIAGNOSIS — Z981 Arthrodesis status: Secondary | ICD-10-CM | POA: Diagnosis not present

## 2019-05-22 ENCOUNTER — Ambulatory Visit
Admission: RE | Admit: 2019-05-22 | Discharge: 2019-05-22 | Disposition: A | Discharge status: Home / Self Care | Payer: 59 | Source: Ambulatory Visit | Attending: Radiation Oncology | Admitting: Radiation Oncology

## 2019-05-22 ENCOUNTER — Other Ambulatory Visit: Payer: Self-pay

## 2019-05-22 DIAGNOSIS — M4856XA Collapsed vertebra, not elsewhere classified, lumbar region, initial encounter for fracture: Secondary | ICD-10-CM | POA: Diagnosis not present

## 2019-05-22 DIAGNOSIS — R590 Localized enlarged lymph nodes: Secondary | ICD-10-CM | POA: Diagnosis not present

## 2019-05-22 DIAGNOSIS — K8020 Calculus of gallbladder without cholecystitis without obstruction: Secondary | ICD-10-CM | POA: Diagnosis not present

## 2019-05-22 DIAGNOSIS — G629 Polyneuropathy, unspecified: Secondary | ICD-10-CM | POA: Diagnosis not present

## 2019-05-22 DIAGNOSIS — M25519 Pain in unspecified shoulder: Secondary | ICD-10-CM | POA: Diagnosis not present

## 2019-05-22 DIAGNOSIS — Z792 Long term (current) use of antibiotics: Secondary | ICD-10-CM | POA: Diagnosis not present

## 2019-05-22 DIAGNOSIS — Z510 Encounter for antineoplastic radiation therapy: Secondary | ICD-10-CM | POA: Diagnosis not present

## 2019-05-22 DIAGNOSIS — D61810 Antineoplastic chemotherapy induced pancytopenia: Secondary | ICD-10-CM | POA: Diagnosis not present

## 2019-05-22 DIAGNOSIS — R200 Anesthesia of skin: Secondary | ICD-10-CM | POA: Diagnosis not present

## 2019-05-22 DIAGNOSIS — M720 Palmar fascial fibromatosis [Dupuytren]: Secondary | ICD-10-CM | POA: Diagnosis not present

## 2019-05-22 DIAGNOSIS — E039 Hypothyroidism, unspecified: Secondary | ICD-10-CM | POA: Diagnosis not present

## 2019-05-22 DIAGNOSIS — C9002 Multiple myeloma in relapse: Secondary | ICD-10-CM | POA: Diagnosis not present

## 2019-05-22 DIAGNOSIS — Z981 Arthrodesis status: Secondary | ICD-10-CM | POA: Diagnosis not present

## 2019-05-22 DIAGNOSIS — C7951 Secondary malignant neoplasm of bone: Secondary | ICD-10-CM | POA: Diagnosis not present

## 2019-05-22 DIAGNOSIS — R531 Weakness: Secondary | ICD-10-CM | POA: Diagnosis not present

## 2019-05-22 DIAGNOSIS — M8458XD Pathological fracture in neoplastic disease, other specified site, subsequent encounter for fracture with routine healing: Secondary | ICD-10-CM | POA: Diagnosis not present

## 2019-05-22 DIAGNOSIS — Z483 Aftercare following surgery for neoplasm: Secondary | ICD-10-CM | POA: Diagnosis not present

## 2019-05-23 ENCOUNTER — Ambulatory Visit
Admission: RE | Admit: 2019-05-23 | Discharge: 2019-05-23 | Disposition: A | Discharge status: Home / Self Care | Payer: 59 | Source: Ambulatory Visit | Attending: Radiation Oncology | Admitting: Radiation Oncology

## 2019-05-23 ENCOUNTER — Other Ambulatory Visit: Payer: Self-pay

## 2019-05-23 DIAGNOSIS — M4856XA Collapsed vertebra, not elsewhere classified, lumbar region, initial encounter for fracture: Secondary | ICD-10-CM | POA: Diagnosis not present

## 2019-05-23 DIAGNOSIS — R200 Anesthesia of skin: Secondary | ICD-10-CM | POA: Diagnosis not present

## 2019-05-23 DIAGNOSIS — R531 Weakness: Secondary | ICD-10-CM | POA: Diagnosis not present

## 2019-05-23 DIAGNOSIS — G629 Polyneuropathy, unspecified: Secondary | ICD-10-CM | POA: Diagnosis not present

## 2019-05-23 DIAGNOSIS — R590 Localized enlarged lymph nodes: Secondary | ICD-10-CM | POA: Diagnosis not present

## 2019-05-23 DIAGNOSIS — M25519 Pain in unspecified shoulder: Secondary | ICD-10-CM | POA: Diagnosis not present

## 2019-05-23 DIAGNOSIS — C9002 Multiple myeloma in relapse: Secondary | ICD-10-CM | POA: Diagnosis not present

## 2019-05-23 DIAGNOSIS — E039 Hypothyroidism, unspecified: Secondary | ICD-10-CM | POA: Diagnosis not present

## 2019-05-23 DIAGNOSIS — C7951 Secondary malignant neoplasm of bone: Secondary | ICD-10-CM | POA: Diagnosis not present

## 2019-05-23 DIAGNOSIS — Z510 Encounter for antineoplastic radiation therapy: Secondary | ICD-10-CM | POA: Diagnosis not present

## 2019-05-24 ENCOUNTER — Other Ambulatory Visit: Payer: Self-pay

## 2019-05-24 ENCOUNTER — Ambulatory Visit
Admission: RE | Admit: 2019-05-24 | Discharge: 2019-05-24 | Disposition: A | Discharge status: Home / Self Care | Payer: 59 | Source: Ambulatory Visit | Attending: Radiation Oncology | Admitting: Radiation Oncology

## 2019-05-24 DIAGNOSIS — E039 Hypothyroidism, unspecified: Secondary | ICD-10-CM | POA: Diagnosis not present

## 2019-05-24 DIAGNOSIS — R531 Weakness: Secondary | ICD-10-CM | POA: Diagnosis not present

## 2019-05-24 DIAGNOSIS — G629 Polyneuropathy, unspecified: Secondary | ICD-10-CM | POA: Diagnosis not present

## 2019-05-24 DIAGNOSIS — C9002 Multiple myeloma in relapse: Secondary | ICD-10-CM | POA: Diagnosis not present

## 2019-05-24 DIAGNOSIS — C7951 Secondary malignant neoplasm of bone: Secondary | ICD-10-CM | POA: Diagnosis not present

## 2019-05-24 DIAGNOSIS — R200 Anesthesia of skin: Secondary | ICD-10-CM | POA: Diagnosis not present

## 2019-05-24 DIAGNOSIS — M25519 Pain in unspecified shoulder: Secondary | ICD-10-CM | POA: Diagnosis not present

## 2019-05-24 DIAGNOSIS — R590 Localized enlarged lymph nodes: Secondary | ICD-10-CM | POA: Diagnosis not present

## 2019-05-24 DIAGNOSIS — M4856XA Collapsed vertebra, not elsewhere classified, lumbar region, initial encounter for fracture: Secondary | ICD-10-CM | POA: Diagnosis not present

## 2019-05-24 DIAGNOSIS — Z510 Encounter for antineoplastic radiation therapy: Secondary | ICD-10-CM | POA: Diagnosis not present

## 2019-05-25 ENCOUNTER — Ambulatory Visit: Payer: 59

## 2019-05-25 DIAGNOSIS — Z9484 Stem cells transplant status: Secondary | ICD-10-CM | POA: Diagnosis not present

## 2019-05-25 DIAGNOSIS — Z981 Arthrodesis status: Secondary | ICD-10-CM | POA: Diagnosis not present

## 2019-05-25 DIAGNOSIS — Z4789 Encounter for other orthopedic aftercare: Secondary | ICD-10-CM | POA: Diagnosis not present

## 2019-05-25 DIAGNOSIS — Z4802 Encounter for removal of sutures: Secondary | ICD-10-CM | POA: Diagnosis not present

## 2019-05-25 DIAGNOSIS — C9001 Multiple myeloma in remission: Secondary | ICD-10-CM | POA: Diagnosis not present

## 2019-05-25 DIAGNOSIS — C9002 Multiple myeloma in relapse: Secondary | ICD-10-CM | POA: Diagnosis not present

## 2019-05-26 ENCOUNTER — Other Ambulatory Visit: Payer: Self-pay

## 2019-05-26 ENCOUNTER — Ambulatory Visit
Admission: RE | Admit: 2019-05-26 | Discharge: 2019-05-26 | Disposition: A | Discharge status: Home / Self Care | Payer: 59 | Source: Ambulatory Visit | Attending: Radiation Oncology | Admitting: Radiation Oncology

## 2019-05-26 DIAGNOSIS — R200 Anesthesia of skin: Secondary | ICD-10-CM | POA: Diagnosis not present

## 2019-05-26 DIAGNOSIS — C7951 Secondary malignant neoplasm of bone: Secondary | ICD-10-CM | POA: Diagnosis not present

## 2019-05-26 DIAGNOSIS — Z483 Aftercare following surgery for neoplasm: Secondary | ICD-10-CM | POA: Diagnosis not present

## 2019-05-26 DIAGNOSIS — R590 Localized enlarged lymph nodes: Secondary | ICD-10-CM | POA: Diagnosis not present

## 2019-05-26 DIAGNOSIS — M25519 Pain in unspecified shoulder: Secondary | ICD-10-CM | POA: Diagnosis not present

## 2019-05-26 DIAGNOSIS — G629 Polyneuropathy, unspecified: Secondary | ICD-10-CM | POA: Diagnosis not present

## 2019-05-26 DIAGNOSIS — K8020 Calculus of gallbladder without cholecystitis without obstruction: Secondary | ICD-10-CM | POA: Diagnosis not present

## 2019-05-26 DIAGNOSIS — Z792 Long term (current) use of antibiotics: Secondary | ICD-10-CM | POA: Diagnosis not present

## 2019-05-26 DIAGNOSIS — M720 Palmar fascial fibromatosis [Dupuytren]: Secondary | ICD-10-CM | POA: Diagnosis not present

## 2019-05-26 DIAGNOSIS — C9002 Multiple myeloma in relapse: Secondary | ICD-10-CM | POA: Diagnosis not present

## 2019-05-26 DIAGNOSIS — Z510 Encounter for antineoplastic radiation therapy: Secondary | ICD-10-CM | POA: Diagnosis not present

## 2019-05-26 DIAGNOSIS — M8458XD Pathological fracture in neoplastic disease, other specified site, subsequent encounter for fracture with routine healing: Secondary | ICD-10-CM | POA: Diagnosis not present

## 2019-05-26 DIAGNOSIS — E039 Hypothyroidism, unspecified: Secondary | ICD-10-CM | POA: Diagnosis not present

## 2019-05-26 DIAGNOSIS — D61810 Antineoplastic chemotherapy induced pancytopenia: Secondary | ICD-10-CM | POA: Diagnosis not present

## 2019-05-26 DIAGNOSIS — M4856XA Collapsed vertebra, not elsewhere classified, lumbar region, initial encounter for fracture: Secondary | ICD-10-CM | POA: Diagnosis not present

## 2019-05-26 DIAGNOSIS — Z981 Arthrodesis status: Secondary | ICD-10-CM | POA: Diagnosis not present

## 2019-05-26 DIAGNOSIS — R531 Weakness: Secondary | ICD-10-CM | POA: Diagnosis not present

## 2019-05-29 ENCOUNTER — Other Ambulatory Visit: Payer: Self-pay

## 2019-05-29 ENCOUNTER — Ambulatory Visit
Admission: RE | Admit: 2019-05-29 | Discharge: 2019-05-29 | Disposition: A | Discharge status: Home / Self Care | Payer: 59 | Source: Ambulatory Visit | Attending: Radiation Oncology | Admitting: Radiation Oncology

## 2019-05-29 DIAGNOSIS — R200 Anesthesia of skin: Secondary | ICD-10-CM | POA: Insufficient documentation

## 2019-05-29 DIAGNOSIS — E079 Disorder of thyroid, unspecified: Secondary | ICD-10-CM | POA: Diagnosis not present

## 2019-05-29 DIAGNOSIS — G629 Polyneuropathy, unspecified: Secondary | ICD-10-CM | POA: Insufficient documentation

## 2019-05-29 DIAGNOSIS — R590 Localized enlarged lymph nodes: Secondary | ICD-10-CM | POA: Insufficient documentation

## 2019-05-29 DIAGNOSIS — C9002 Multiple myeloma in relapse: Secondary | ICD-10-CM | POA: Insufficient documentation

## 2019-05-29 DIAGNOSIS — K8020 Calculus of gallbladder without cholecystitis without obstruction: Secondary | ICD-10-CM | POA: Diagnosis not present

## 2019-05-29 DIAGNOSIS — R531 Weakness: Secondary | ICD-10-CM | POA: Insufficient documentation

## 2019-05-29 DIAGNOSIS — M4856XA Collapsed vertebra, not elsewhere classified, lumbar region, initial encounter for fracture: Secondary | ICD-10-CM | POA: Diagnosis not present

## 2019-05-29 DIAGNOSIS — C7951 Secondary malignant neoplasm of bone: Secondary | ICD-10-CM | POA: Diagnosis not present

## 2019-05-29 DIAGNOSIS — E039 Hypothyroidism, unspecified: Secondary | ICD-10-CM | POA: Diagnosis not present

## 2019-05-29 DIAGNOSIS — Z510 Encounter for antineoplastic radiation therapy: Secondary | ICD-10-CM | POA: Insufficient documentation

## 2019-05-29 DIAGNOSIS — Z923 Personal history of irradiation: Secondary | ICD-10-CM | POA: Insufficient documentation

## 2019-05-29 DIAGNOSIS — Z808 Family history of malignant neoplasm of other organs or systems: Secondary | ICD-10-CM | POA: Diagnosis not present

## 2019-05-29 DIAGNOSIS — M25519 Pain in unspecified shoulder: Secondary | ICD-10-CM | POA: Diagnosis not present

## 2019-05-29 DIAGNOSIS — Z79899 Other long term (current) drug therapy: Secondary | ICD-10-CM | POA: Insufficient documentation

## 2019-05-30 ENCOUNTER — Other Ambulatory Visit: Payer: Self-pay

## 2019-05-30 ENCOUNTER — Ambulatory Visit
Admission: RE | Admit: 2019-05-30 | Discharge: 2019-05-30 | Disposition: A | Discharge status: Home / Self Care | Payer: 59 | Source: Ambulatory Visit | Attending: Radiation Oncology | Admitting: Radiation Oncology

## 2019-05-30 DIAGNOSIS — M25519 Pain in unspecified shoulder: Secondary | ICD-10-CM | POA: Diagnosis not present

## 2019-05-30 DIAGNOSIS — R590 Localized enlarged lymph nodes: Secondary | ICD-10-CM | POA: Diagnosis not present

## 2019-05-30 DIAGNOSIS — C9002 Multiple myeloma in relapse: Secondary | ICD-10-CM | POA: Diagnosis not present

## 2019-05-30 DIAGNOSIS — E039 Hypothyroidism, unspecified: Secondary | ICD-10-CM | POA: Diagnosis not present

## 2019-05-30 DIAGNOSIS — M4856XA Collapsed vertebra, not elsewhere classified, lumbar region, initial encounter for fracture: Secondary | ICD-10-CM | POA: Diagnosis not present

## 2019-05-30 DIAGNOSIS — G629 Polyneuropathy, unspecified: Secondary | ICD-10-CM | POA: Diagnosis not present

## 2019-05-30 DIAGNOSIS — C7951 Secondary malignant neoplasm of bone: Secondary | ICD-10-CM | POA: Diagnosis not present

## 2019-05-30 DIAGNOSIS — R200 Anesthesia of skin: Secondary | ICD-10-CM | POA: Diagnosis not present

## 2019-05-30 DIAGNOSIS — Z510 Encounter for antineoplastic radiation therapy: Secondary | ICD-10-CM | POA: Diagnosis not present

## 2019-05-30 DIAGNOSIS — R531 Weakness: Secondary | ICD-10-CM | POA: Diagnosis not present

## 2019-05-31 ENCOUNTER — Other Ambulatory Visit: Payer: Self-pay

## 2019-05-31 ENCOUNTER — Ambulatory Visit
Admission: RE | Admit: 2019-05-31 | Discharge: 2019-05-31 | Disposition: A | Discharge status: Home / Self Care | Payer: 59 | Source: Ambulatory Visit | Attending: Radiation Oncology | Admitting: Radiation Oncology

## 2019-05-31 DIAGNOSIS — R200 Anesthesia of skin: Secondary | ICD-10-CM | POA: Diagnosis not present

## 2019-05-31 DIAGNOSIS — R590 Localized enlarged lymph nodes: Secondary | ICD-10-CM | POA: Diagnosis not present

## 2019-05-31 DIAGNOSIS — C7951 Secondary malignant neoplasm of bone: Secondary | ICD-10-CM | POA: Diagnosis not present

## 2019-05-31 DIAGNOSIS — Z510 Encounter for antineoplastic radiation therapy: Secondary | ICD-10-CM | POA: Diagnosis not present

## 2019-05-31 DIAGNOSIS — M4856XA Collapsed vertebra, not elsewhere classified, lumbar region, initial encounter for fracture: Secondary | ICD-10-CM | POA: Diagnosis not present

## 2019-05-31 DIAGNOSIS — C9002 Multiple myeloma in relapse: Secondary | ICD-10-CM | POA: Diagnosis not present

## 2019-05-31 DIAGNOSIS — M25519 Pain in unspecified shoulder: Secondary | ICD-10-CM | POA: Diagnosis not present

## 2019-05-31 DIAGNOSIS — E039 Hypothyroidism, unspecified: Secondary | ICD-10-CM | POA: Diagnosis not present

## 2019-05-31 DIAGNOSIS — R531 Weakness: Secondary | ICD-10-CM | POA: Diagnosis not present

## 2019-05-31 DIAGNOSIS — G629 Polyneuropathy, unspecified: Secondary | ICD-10-CM | POA: Diagnosis not present

## 2019-06-01 ENCOUNTER — Ambulatory Visit
Admission: RE | Admit: 2019-06-01 | Discharge: 2019-06-01 | Disposition: A | Discharge status: Home / Self Care | Payer: 59 | Source: Ambulatory Visit | Attending: Radiation Oncology | Admitting: Radiation Oncology

## 2019-06-01 ENCOUNTER — Other Ambulatory Visit: Payer: Self-pay

## 2019-06-01 DIAGNOSIS — G629 Polyneuropathy, unspecified: Secondary | ICD-10-CM | POA: Diagnosis not present

## 2019-06-01 DIAGNOSIS — R200 Anesthesia of skin: Secondary | ICD-10-CM | POA: Diagnosis not present

## 2019-06-01 DIAGNOSIS — Z5111 Encounter for antineoplastic chemotherapy: Secondary | ICD-10-CM | POA: Diagnosis not present

## 2019-06-01 DIAGNOSIS — M25519 Pain in unspecified shoulder: Secondary | ICD-10-CM | POA: Diagnosis not present

## 2019-06-01 DIAGNOSIS — C9001 Multiple myeloma in remission: Secondary | ICD-10-CM | POA: Diagnosis not present

## 2019-06-01 DIAGNOSIS — Z79899 Other long term (current) drug therapy: Secondary | ICD-10-CM | POA: Diagnosis not present

## 2019-06-01 DIAGNOSIS — C7951 Secondary malignant neoplasm of bone: Secondary | ICD-10-CM | POA: Diagnosis not present

## 2019-06-01 DIAGNOSIS — C9002 Multiple myeloma in relapse: Secondary | ICD-10-CM | POA: Diagnosis not present

## 2019-06-01 DIAGNOSIS — Z510 Encounter for antineoplastic radiation therapy: Secondary | ICD-10-CM | POA: Diagnosis not present

## 2019-06-01 DIAGNOSIS — R590 Localized enlarged lymph nodes: Secondary | ICD-10-CM | POA: Diagnosis not present

## 2019-06-01 DIAGNOSIS — S2231XA Fracture of one rib, right side, initial encounter for closed fracture: Secondary | ICD-10-CM | POA: Diagnosis not present

## 2019-06-01 DIAGNOSIS — E039 Hypothyroidism, unspecified: Secondary | ICD-10-CM | POA: Diagnosis not present

## 2019-06-01 DIAGNOSIS — M4856XA Collapsed vertebra, not elsewhere classified, lumbar region, initial encounter for fracture: Secondary | ICD-10-CM | POA: Diagnosis not present

## 2019-06-01 DIAGNOSIS — Z9484 Stem cells transplant status: Secondary | ICD-10-CM | POA: Diagnosis not present

## 2019-06-01 DIAGNOSIS — R531 Weakness: Secondary | ICD-10-CM | POA: Diagnosis not present

## 2019-06-02 ENCOUNTER — Ambulatory Visit: Payer: 59

## 2019-06-02 ENCOUNTER — Ambulatory Visit
Admission: RE | Admit: 2019-06-02 | Discharge: 2019-06-02 | Disposition: A | Discharge status: Home / Self Care | Payer: 59 | Source: Ambulatory Visit | Attending: Radiation Oncology | Admitting: Radiation Oncology

## 2019-06-02 ENCOUNTER — Other Ambulatory Visit: Payer: Self-pay

## 2019-06-02 DIAGNOSIS — M4856XA Collapsed vertebra, not elsewhere classified, lumbar region, initial encounter for fracture: Secondary | ICD-10-CM | POA: Diagnosis not present

## 2019-06-02 DIAGNOSIS — G629 Polyneuropathy, unspecified: Secondary | ICD-10-CM | POA: Diagnosis not present

## 2019-06-02 DIAGNOSIS — M25519 Pain in unspecified shoulder: Secondary | ICD-10-CM | POA: Diagnosis not present

## 2019-06-02 DIAGNOSIS — Z510 Encounter for antineoplastic radiation therapy: Secondary | ICD-10-CM | POA: Diagnosis not present

## 2019-06-02 DIAGNOSIS — E039 Hypothyroidism, unspecified: Secondary | ICD-10-CM | POA: Diagnosis not present

## 2019-06-02 DIAGNOSIS — C9002 Multiple myeloma in relapse: Secondary | ICD-10-CM | POA: Diagnosis not present

## 2019-06-02 DIAGNOSIS — R531 Weakness: Secondary | ICD-10-CM | POA: Diagnosis not present

## 2019-06-02 DIAGNOSIS — R200 Anesthesia of skin: Secondary | ICD-10-CM | POA: Diagnosis not present

## 2019-06-02 DIAGNOSIS — R590 Localized enlarged lymph nodes: Secondary | ICD-10-CM | POA: Diagnosis not present

## 2019-06-02 DIAGNOSIS — C7951 Secondary malignant neoplasm of bone: Secondary | ICD-10-CM | POA: Diagnosis not present

## 2019-06-05 ENCOUNTER — Other Ambulatory Visit: Payer: Self-pay

## 2019-06-05 ENCOUNTER — Ambulatory Visit
Admission: RE | Admit: 2019-06-05 | Discharge: 2019-06-05 | Disposition: A | Discharge status: Home / Self Care | Payer: 59 | Source: Ambulatory Visit | Attending: Radiation Oncology | Admitting: Radiation Oncology

## 2019-06-05 ENCOUNTER — Encounter: Payer: Self-pay | Admitting: Radiation Oncology

## 2019-06-05 DIAGNOSIS — C7951 Secondary malignant neoplasm of bone: Secondary | ICD-10-CM | POA: Diagnosis not present

## 2019-06-05 DIAGNOSIS — R200 Anesthesia of skin: Secondary | ICD-10-CM | POA: Diagnosis not present

## 2019-06-05 DIAGNOSIS — Z510 Encounter for antineoplastic radiation therapy: Secondary | ICD-10-CM | POA: Diagnosis not present

## 2019-06-05 DIAGNOSIS — G629 Polyneuropathy, unspecified: Secondary | ICD-10-CM | POA: Diagnosis not present

## 2019-06-05 DIAGNOSIS — M4856XA Collapsed vertebra, not elsewhere classified, lumbar region, initial encounter for fracture: Secondary | ICD-10-CM | POA: Diagnosis not present

## 2019-06-05 DIAGNOSIS — C9002 Multiple myeloma in relapse: Secondary | ICD-10-CM | POA: Diagnosis not present

## 2019-06-05 DIAGNOSIS — M25519 Pain in unspecified shoulder: Secondary | ICD-10-CM | POA: Diagnosis not present

## 2019-06-05 DIAGNOSIS — E039 Hypothyroidism, unspecified: Secondary | ICD-10-CM | POA: Diagnosis not present

## 2019-06-05 DIAGNOSIS — R531 Weakness: Secondary | ICD-10-CM | POA: Diagnosis not present

## 2019-06-05 DIAGNOSIS — R590 Localized enlarged lymph nodes: Secondary | ICD-10-CM | POA: Diagnosis not present

## 2019-06-05 NOTE — Progress Notes (Signed)
  Radiation Oncology         (336) [phone] ________________________________  Name: REBERTA WEITZEL MRN: [id number]  Date: 06/05/2019  DOB: [birth year]  End of Treatment Note  Diagnosis:   64 yo woman with myeloma s/p dorsal laminectomy at T6 with hardware stabilization T4-T8  Indication for treatment:  Palliation       Radiation treatment dates:   1/25-06/05/19  Site/dose:   T4-T8 were treated to 20 Gy in 10 fractions of 2 Gy  Beams/energy:   A 3D field arrangement with DVHs of lungs heart and spinal cord was set up with static gantry angles zero and 180 using 6 MV X-rays  Narrative: The patient tolerated radiation treatment relatively well.   She denied esophagitis.  Plan: The patient has completed radiation treatment. The patient will return to radiation oncology clinic for routine followup in one month. I advised her to call or return sooner if she has any questions or concerns related to her recovery or treatment. ________________________________  Sheral Apley. [name], M.D.

## 2019-06-07 ENCOUNTER — Other Ambulatory Visit (HOSPITAL_COMMUNITY)
Admission: RE | Admit: 2019-06-07 | Discharge: 2019-06-07 | Disposition: A | Discharge status: Home / Self Care | Payer: 59 | Source: Ambulatory Visit | Attending: Physician Assistant | Admitting: Physician Assistant

## 2019-06-07 DIAGNOSIS — C9000 Multiple myeloma not having achieved remission: Secondary | ICD-10-CM | POA: Diagnosis not present

## 2019-06-07 LAB — CBC WITH DIFFERENTIAL/PLATELET
Abs Immature Granulocytes: 0.14 10*3/uL — ABNORMAL HIGH (ref 0.00–0.07)
Basophils Absolute: 0 10*3/uL (ref 0.0–0.1)
Basophils Relative: 1 %
Eosinophils Absolute: 0.1 10*3/uL (ref 0.0–0.5)
Eosinophils Relative: 6 %
HCT: 27.8 % — ABNORMAL LOW (ref 36.0–46.0)
Hemoglobin: 8.7 g/dL — ABNORMAL LOW (ref 12.0–15.0)
Immature Granulocytes: 9 %
Lymphocytes Relative: 11 %
Lymphs Abs: 0.2 10*3/uL — ABNORMAL LOW (ref 0.7–4.0)
MCH: 31 pg (ref 26.0–34.0)
MCHC: 31.3 g/dL (ref 30.0–36.0)
MCV: 98.9 fL (ref 80.0–100.0)
Monocytes Absolute: 0.3 10*3/uL (ref 0.1–1.0)
Monocytes Relative: 19 %
Neutro Abs: 0.9 10*3/uL — ABNORMAL LOW (ref 1.7–7.7)
Neutrophils Relative %: 54 %
Platelets: 39 10*3/uL — ABNORMAL LOW (ref 150–400)
RBC: 2.81 MIL/uL — ABNORMAL LOW (ref 3.87–5.11)
RDW: 20.5 % — ABNORMAL HIGH (ref 11.5–15.5)
WBC: 1.6 10*3/uL — ABNORMAL LOW (ref 4.0–10.5)
nRBC: 0 % (ref 0.0–0.2)

## 2019-06-12 ENCOUNTER — Other Ambulatory Visit: Payer: Self-pay | Admitting: Family Medicine

## 2019-06-12 MED FILL — LEVOTHYROXINE 75 MCG TABLET: 75 | 90 days supply | Qty: 90 | Fill #0

## 2019-06-12 MED FILL — ACYCLOVIR 400 MG TABLET: 400 | 30 days supply | Qty: 60 | Fill #1

## 2019-06-12 NOTE — Telephone Encounter (Signed)
Please contact patient to have her set up an appt. Then may route back to nurses. Thank you

## 2019-06-12 NOTE — Telephone Encounter (Signed)
Patient scheduled  Medication follow up visit for 3/15 for

## 2019-06-12 NOTE — Telephone Encounter (Signed)
May have 90-day 

## 2019-06-14 ENCOUNTER — Other Ambulatory Visit: Payer: Self-pay | Admitting: Hematology and Oncology

## 2019-06-14 DIAGNOSIS — C9000 Multiple myeloma not having achieved remission: Secondary | ICD-10-CM

## 2019-06-20 ENCOUNTER — Other Ambulatory Visit: Payer: Self-pay

## 2019-06-20 ENCOUNTER — Encounter (HOSPITAL_COMMUNITY)
Admission: RE | Admit: 2019-06-20 | Discharge: 2019-06-20 | Disposition: A | Discharge status: Home / Self Care | Payer: 59 | Source: Ambulatory Visit | Attending: Hematology and Oncology | Admitting: Hematology and Oncology

## 2019-06-20 DIAGNOSIS — C9000 Multiple myeloma not having achieved remission: Secondary | ICD-10-CM | POA: Insufficient documentation

## 2019-06-20 LAB — GLUCOSE, CAPILLARY: Glucose-Capillary: 108 mg/dL — ABNORMAL HIGH (ref 70–99)

## 2019-06-20 MED ORDER — FLUDEOXYGLUCOSE F - 18 (FDG) INJECTION
5.6500 | Freq: Once | INTRAVENOUS | Status: AC
  Administered 2019-06-20: 13:00:00 5.65 via INTRAVENOUS

## 2019-06-22 ENCOUNTER — Other Ambulatory Visit (HOSPITAL_COMMUNITY): Payer: Self-pay | Admitting: Physician Assistant

## 2019-06-22 DIAGNOSIS — C9002 Multiple myeloma in relapse: Secondary | ICD-10-CM | POA: Diagnosis not present

## 2019-06-22 DIAGNOSIS — R509 Fever, unspecified: Secondary | ICD-10-CM | POA: Diagnosis not present

## 2019-06-22 DIAGNOSIS — R000 Tachycardia, unspecified: Secondary | ICD-10-CM | POA: Diagnosis not present

## 2019-06-22 DIAGNOSIS — T451X5A Adverse effect of antineoplastic and immunosuppressive drugs, initial encounter: Secondary | ICD-10-CM | POA: Diagnosis not present

## 2019-06-22 DIAGNOSIS — R634 Abnormal weight loss: Secondary | ICD-10-CM | POA: Diagnosis not present

## 2019-06-22 DIAGNOSIS — Z20822 Contact with and (suspected) exposure to covid-19: Secondary | ICD-10-CM | POA: Diagnosis not present

## 2019-06-22 DIAGNOSIS — C7951 Secondary malignant neoplasm of bone: Secondary | ICD-10-CM | POA: Diagnosis not present

## 2019-06-22 DIAGNOSIS — E039 Hypothyroidism, unspecified: Secondary | ICD-10-CM | POA: Diagnosis not present

## 2019-06-22 DIAGNOSIS — Z981 Arthrodesis status: Secondary | ICD-10-CM | POA: Diagnosis not present

## 2019-06-22 DIAGNOSIS — R5383 Other fatigue: Secondary | ICD-10-CM | POA: Diagnosis not present

## 2019-06-22 DIAGNOSIS — C9001 Multiple myeloma in remission: Secondary | ICD-10-CM | POA: Diagnosis not present

## 2019-06-22 DIAGNOSIS — D61818 Other pancytopenia: Secondary | ICD-10-CM | POA: Diagnosis not present

## 2019-06-22 DIAGNOSIS — R6510 Systemic inflammatory response syndrome (SIRS) of non-infectious origin without acute organ dysfunction: Secondary | ICD-10-CM | POA: Diagnosis not present

## 2019-06-22 DIAGNOSIS — R630 Anorexia: Secondary | ICD-10-CM | POA: Diagnosis not present

## 2019-06-22 DIAGNOSIS — Z9484 Stem cells transplant status: Secondary | ICD-10-CM | POA: Diagnosis not present

## 2019-06-22 DIAGNOSIS — C7949 Secondary malignant neoplasm of other parts of nervous system: Secondary | ICD-10-CM | POA: Diagnosis not present

## 2019-06-22 DIAGNOSIS — R918 Other nonspecific abnormal finding of lung field: Secondary | ICD-10-CM | POA: Diagnosis not present

## 2019-06-22 DIAGNOSIS — C9000 Multiple myeloma not having achieved remission: Secondary | ICD-10-CM | POA: Diagnosis not present

## 2019-06-22 DIAGNOSIS — G9520 Unspecified cord compression: Secondary | ICD-10-CM | POA: Diagnosis not present

## 2019-06-22 DIAGNOSIS — D61810 Antineoplastic chemotherapy induced pancytopenia: Secondary | ICD-10-CM | POA: Diagnosis not present

## 2019-06-22 MED FILL — PANTOPRAZOLE SOD DR 40 MG T: 40 | 90 days supply | Qty: 90 | Fill #0

## 2019-06-22 MED FILL — traMADol HCL 50 MG TABS: 50 | 30 days supply | Qty: 90 | Fill #0

## 2019-06-22 MED FILL — ONDANSETRON HCL 8 MG TABLET: 8 | 6 days supply | Qty: 20 | Fill #0

## 2019-06-24 DIAGNOSIS — D61810 Antineoplastic chemotherapy induced pancytopenia: Secondary | ICD-10-CM | POA: Diagnosis not present

## 2019-06-24 DIAGNOSIS — D61818 Other pancytopenia: Secondary | ICD-10-CM | POA: Diagnosis not present

## 2019-06-24 DIAGNOSIS — Z20822 Contact with and (suspected) exposure to covid-19: Secondary | ICD-10-CM | POA: Diagnosis not present

## 2019-06-24 DIAGNOSIS — C9002 Multiple myeloma in relapse: Secondary | ICD-10-CM | POA: Diagnosis not present

## 2019-06-24 DIAGNOSIS — R509 Fever, unspecified: Secondary | ICD-10-CM | POA: Diagnosis not present

## 2019-06-24 DIAGNOSIS — R000 Tachycardia, unspecified: Secondary | ICD-10-CM | POA: Diagnosis not present

## 2019-06-24 DIAGNOSIS — R918 Other nonspecific abnormal finding of lung field: Secondary | ICD-10-CM | POA: Diagnosis not present

## 2019-06-24 DIAGNOSIS — R5383 Other fatigue: Secondary | ICD-10-CM | POA: Diagnosis not present

## 2019-06-25 DIAGNOSIS — R509 Fever, unspecified: Secondary | ICD-10-CM | POA: Diagnosis not present

## 2019-06-25 DIAGNOSIS — C9002 Multiple myeloma in relapse: Secondary | ICD-10-CM | POA: Diagnosis not present

## 2019-06-25 DIAGNOSIS — D61810 Antineoplastic chemotherapy induced pancytopenia: Secondary | ICD-10-CM | POA: Diagnosis not present

## 2019-06-26 DIAGNOSIS — C9002 Multiple myeloma in relapse: Secondary | ICD-10-CM | POA: Diagnosis not present

## 2019-06-26 DIAGNOSIS — D61810 Antineoplastic chemotherapy induced pancytopenia: Secondary | ICD-10-CM | POA: Diagnosis not present

## 2019-06-26 DIAGNOSIS — R509 Fever, unspecified: Secondary | ICD-10-CM | POA: Diagnosis not present

## 2019-06-26 MED ORDER — TRAMADOL HCL 50 MG PO TABS
50.00 | ORAL_TABLET | ORAL | Status: DC
Start: ? — End: 2019-06-26

## 2019-06-26 MED ORDER — ACYCLOVIR 400 MG PO TABS
400.00 | ORAL_TABLET | ORAL | Status: DC
Start: 2019-06-26 — End: 2019-06-26

## 2019-06-26 MED ORDER — GABAPENTIN 300 MG PO CAPS
300.00 | ORAL_CAPSULE | ORAL | Status: DC
Start: 2019-06-26 — End: 2019-06-26

## 2019-06-26 MED ORDER — NASAL SPRAY 0.05 % NA SOLN
2.00 | NASAL | Status: DC
Start: ? — End: 2019-06-26

## 2019-06-26 MED ORDER — GENERIC EXTERNAL MEDICATION
1.00 | Status: DC
Start: 2019-06-26 — End: 2019-06-26

## 2019-06-26 MED ORDER — ALLOPURINOL 300 MG PO TABS
300.00 | ORAL_TABLET | ORAL | Status: DC
Start: 2019-06-27 — End: 2019-06-26

## 2019-06-26 MED ORDER — ONDANSETRON HCL 4 MG/2ML IJ SOLN
4.00 | INTRAMUSCULAR | Status: DC
Start: ? — End: 2019-06-26

## 2019-06-26 MED ORDER — OXYCODONE HCL 5 MG PO TABS
10.00 | ORAL_TABLET | ORAL | Status: DC
Start: ? — End: 2019-06-26

## 2019-06-26 MED ORDER — LEVOTHYROXINE SODIUM 75 MCG PO TABS
75.00 | ORAL_TABLET | ORAL | Status: DC
Start: 2019-06-27 — End: 2019-06-26

## 2019-06-26 MED ORDER — PANTOPRAZOLE SODIUM 40 MG PO TBEC
40.00 | DELAYED_RELEASE_TABLET | ORAL | Status: DC
Start: 2019-06-27 — End: 2019-06-26

## 2019-06-27 ENCOUNTER — Encounter: Payer: Self-pay | Admitting: *Deleted

## 2019-06-27 ENCOUNTER — Other Ambulatory Visit: Payer: Self-pay | Admitting: *Deleted

## 2019-06-27 DIAGNOSIS — Z981 Arthrodesis status: Secondary | ICD-10-CM | POA: Insufficient documentation

## 2019-06-27 NOTE — Patient Outreach (Signed)
[facility]) Care Management  06/27/2019  Melissa [name] Oct 20, 1955 [id number]   Transition of care call/case closure   Referral received:06/27/19 Initial outreach:06/27/19 Insurance: [employer] [place] [employer]    Subjective: Initial successful telephone call to patient's preferred number in order to complete transition of care assessment; 2 HIPAA identifiers verified. Explained purpose of call and completed transition of care assessment.  [facility], states that she is feeling much better. She denies having temperature elevations, shortness or increase pain.She reports still on po antibiotic Avelox  to complete 7 day course.  She reports  tolerating diet on nausea. She  denies bowel or bladder problems.  Patient spouse is  assisting with her recovery. She reports having visit with oncology on 3/3 spouse will attend with her, possible chemotherapy treatment to resume depending on lab results.    She does  not have the hospital indemnity, she states currently on FMLA that will end in late March. She asked questions regarding short term long term disability, referred to contact benefits office she discussed having contact number as she is a [employer].  She uses a Ambulance person, at [facility]. Objective:  Melissa [name]  was hospitalized at  [facility] fever, Pancytopenia , relapse of Multiple Myeloma.  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   Assessment:  Patient voices good understanding of all discharge instructions.  See transition of care flowsheet for assessment details.   Plan:  Reviewed hospital discharge diagnosis of Pancytopenia , fever, Multiple Myeloma relapse.  and discharge treatment plan using hospital discharge instructions,  assessing medication adherence, reviewing problems requiring provider notification, and discussing the importance of follow up with primary care  and/or specialists as directed.  No ongoing care management needs identified so will close case to [place] Management services and route successful outreach letter with [place] Management pamphlet and 24 Hour Nurse Line Magnet to [place] Management clinical pool to be mailed to patient's home address.     Joylene Draft, RN, BSN  [place] Management Coordinator  [phone]- Mobile [phone]- Toll Free Main Office

## 2019-06-28 DIAGNOSIS — Z7983 Long term (current) use of bisphosphonates: Secondary | ICD-10-CM | POA: Diagnosis not present

## 2019-06-28 DIAGNOSIS — R638 Other symptoms and signs concerning food and fluid intake: Secondary | ICD-10-CM | POA: Diagnosis not present

## 2019-06-28 DIAGNOSIS — R000 Tachycardia, unspecified: Secondary | ICD-10-CM | POA: Diagnosis not present

## 2019-06-28 DIAGNOSIS — D61818 Other pancytopenia: Secondary | ICD-10-CM | POA: Diagnosis not present

## 2019-06-28 DIAGNOSIS — R5383 Other fatigue: Secondary | ICD-10-CM | POA: Diagnosis not present

## 2019-06-28 DIAGNOSIS — C9002 Multiple myeloma in relapse: Secondary | ICD-10-CM | POA: Diagnosis not present

## 2019-06-28 DIAGNOSIS — C9000 Multiple myeloma not having achieved remission: Secondary | ICD-10-CM | POA: Diagnosis not present

## 2019-06-28 DIAGNOSIS — D7589 Other specified diseases of blood and blood-forming organs: Secondary | ICD-10-CM | POA: Diagnosis not present

## 2019-06-28 DIAGNOSIS — M545 Low back pain: Secondary | ICD-10-CM | POA: Diagnosis not present

## 2019-06-28 DIAGNOSIS — Z7982 Long term (current) use of aspirin: Secondary | ICD-10-CM | POA: Diagnosis not present

## 2019-06-28 DIAGNOSIS — C9001 Multiple myeloma in remission: Secondary | ICD-10-CM | POA: Diagnosis not present

## 2019-06-28 DIAGNOSIS — Z87898 Personal history of other specified conditions: Secondary | ICD-10-CM | POA: Diagnosis not present

## 2019-07-03 MED FILL — MONTELUKAST SOD 10 MG TAB: 10 | 30 days supply | Qty: 30 | Fill #0

## 2019-07-03 MED FILL — ALBUTEROL SULFATE HFA 108 (: 108 (90 BAS | 25 days supply | Qty: 18 | Fill #0

## 2019-07-04 ENCOUNTER — Telehealth: Payer: Self-pay

## 2019-07-04 DIAGNOSIS — C9001 Multiple myeloma in remission: Secondary | ICD-10-CM | POA: Diagnosis not present

## 2019-07-04 DIAGNOSIS — C9002 Multiple myeloma in relapse: Secondary | ICD-10-CM | POA: Diagnosis not present

## 2019-07-04 DIAGNOSIS — Z9484 Stem cells transplant status: Secondary | ICD-10-CM | POA: Diagnosis not present

## 2019-07-04 DIAGNOSIS — Z5111 Encounter for antineoplastic chemotherapy: Secondary | ICD-10-CM | POA: Diagnosis not present

## 2019-07-05 ENCOUNTER — Ambulatory Visit
Admission: RE | Admit: 2019-07-05 | Discharge: 2019-07-05 | Disposition: A | Discharge status: Home / Self Care | Payer: 59 | Source: Ambulatory Visit | Attending: Urology | Admitting: Urology

## 2019-07-05 ENCOUNTER — Other Ambulatory Visit: Payer: Self-pay

## 2019-07-05 DIAGNOSIS — C9002 Multiple myeloma in relapse: Secondary | ICD-10-CM

## 2019-07-05 DIAGNOSIS — C7951 Secondary malignant neoplasm of bone: Secondary | ICD-10-CM

## 2019-07-05 NOTE — Progress Notes (Signed)
Radiation Oncology         [phone]) [phone] ________________________________  Name: Melissa [name] MRN: [id number]  Date: 07/05/2019  DOB: 1955/09/15  Post Treatment Note  CC: Kathyrn Drown, MD  Kathyrn Drown, MD  Diagnosis:   64 yo woman with myeloma s/p dorsal laminectomy at T6 with hardware stabilization T4-T8  Interval Since Last Radiation:  4.5 weeks  1/25-06/05/19:  T4-T8 were treated to 20 Gy in 10 fractions of 2 Gy  05/05/2019: Lumbar spine and sacral metastases / 20 Gy in 5 fractions (Dr. Cristy Hilts)  03/27/19-04/07/19:  1. Left scapula and C7 - T2 / 20 Gy in 10 fractions 2. Right hip / 20 Gy in 10 fractions  10/2008: Palliative radiotherapy to T1-T7 [name])  Narrative:  I spoke with the patient to conduct her routine scheduled 1 month follow up visit via telephone to spare the patient unnecessary potential exposure in the healthcare setting during the current COVID-19 pandemic.  The patient was notified in advance and gave permission to proceed with this visit format. She tolerated radiation treatment very well with no complaints of esophagitis or significant fatigue.  She has transferred her medical oncology care to Dr. Norma Fredrickson at [facility] and is currently being treated with  [place] systemic therapy with carfilzomib and pomalidomide, recently restarted after being held from 05/25/19 - 06/21/19 due to persistent pancytopenia secondary to recent spinal radiation.  She had recent follow-up PET scan on 06/21/2019 which demonstrated an interval partial response to therapy with a decrease in number of FDG avid lesions.  Most notable is considerable decrease in uptake within the cervical and thoracic spine as well as the lumbar spine.  There was also improved uptake within the right iliac bone, proximal right femur and proximal left humerus.  Persistent multifocal areas of abnormal uptake were identified involving the sternum, thoracic spine, bilateral extremities and left hemipelvis.   She also had a repeat myeloma serology on 06/22/2019 which revealed progressive disease with M spike of 2.10 g/dL and K/L ratio 34.09, after having been off systemic therapy temporarily.  Her systemic therapy was resumed on 06/22/2019 and her next scheduled follow-up visit with Dr. Norma Fredrickson is on 08/17/2019.            On review of systems, the patient states that she is doing well overall.  She had continued with significant fatigue and weakness which she attributes to persistent/progressive lumbar spine pain but this has improved, as has her back pain, since she recently resumed systemic therapy.  Her pain is managed with Percocet and tramadol as needed.  She also continues with mild numbness/tingling of the left foot which remains unchanged since prior to her most recent surgery for decompression of spinal cord compression.  She denies any new pain in her upper or lower back, focal weakness or other neurologic symptoms.        ALLERGIES:  is allergic to hydromorphone hcl; poison ivy extract [poison ivy extract]; and tape.  Meds: Current Outpatient Medications  Medication Sig Dispense Refill  . acyclovir (ZOVIRAX) 400 MG tablet Take by mouth.    Marland Kitchen allopurinol (ZYLOPRIM) 300 MG tablet Take by mouth.    . ASPIRIN 81 PO Take 1 tablet by mouth daily.    . Calcium Carbonate-Vitamin D (CALCIUM + D PO) Take 1 tablet by mouth daily.    . Carfilzomib (KYPROLIS IV) Inject into the vein.    . carfilzomib (KYPROLIS) 60 MG SOLR Inject into the vein.    . Cholecalciferol (  VITAMIN D) 2000 UNITS tablet Take 2,000 Units by mouth daily.    Marland Kitchen dexamethasone (DECADRON) 4 MG tablet Take 10 tablets (40 mg total) by mouth once a week. 40 tablet 2  . gabapentin (NEURONTIN) 300 MG capsule Take 300 mg by mouth 3 (three) times daily.    Marland Kitchen ibuprofen (ADVIL) 200 MG tablet Take by mouth.    Marland Kitchen ibuprofen (ADVIL,MOTRIN) 200 MG tablet Take 200 mg by mouth as needed.    Marland Kitchen levothyroxine (SYNTHROID) 75 MCG tablet TAKE 1 TABLET (75 MCG  TOTAL) BY MOUTH DAILY. 90 tablet 0  . magic mouthwash w/lidocaine SOLN Take 15 mLs by mouth every 4 (four) hours as needed for mouth pain. 480 mL 2  . magic mouthwash w/lidocaine SOLN Take 15 mLs by mouth 4 (four) times daily. 480 mL 2  . oxyCODONE-acetaminophen (PERCOCET) 10-325 MG tablet Take 1 tablet by mouth every 8 (eight) hours as needed for pain. 30 tablet 0  . oxyCODONE-acetaminophen (PERCOCET) 10-325 MG tablet Take by mouth.    . pomalidomide (POMALYST) 2 MG capsule Take 1 capsule (2 mg total) by mouth daily. For 21 days and then take 7 days off. 21 capsule 0  . prochlorperazine (COMPAZINE) 10 MG tablet Take 1 tablet (10 mg total) by mouth every 6 (six) hours as needed for nausea or vomiting. 45 tablet 1  . prochlorperazine (COMPAZINE) 10 MG tablet Take by mouth.    . traMADol (ULTRAM) 50 MG tablet Take 50 mg by mouth every 8 (eight) hours as needed.    . triamcinolone cream (KENALOG) 0.1 % Apply BID to affected areas 30 g 1  . acyclovir (ZOVIRAX) 400 MG tablet Take 1 tablet (400 mg total) by mouth 2 (two) times daily. (Patient not taking: Reported on 07/05/2019) 60 tablet 3   No current facility-administered medications for this encounter.   Facility-Administered Medications Ordered in Other Encounters  Medication Dose Route Frequency Provider Last Rate Last Admin  . 0.9 %  sodium chloride infusion   Intravenous Continuous Baird Cancer, PA-C 20 mL/hr at 12/05/10 0947 New Bag at 12/05/10 0947  . 0.9 %  sodium chloride infusion   Intravenous Continuous Everardo All, MD 20 mL/hr at 02/26/11 1528 New Bag at 02/26/11 1528  . sodium chloride 0.9 % injection 10 mL  10 mL Intravenous PRN Everardo All, MD   10 mL at 02/26/11 1518    Physical Findings:  vitals were not taken for this visit.   Karen Kays to assess due to telephone follow up visit format.  Lab Findings: Lab Results  Component Value Date   WBC 1.6 (L) 06/07/2019   HGB 8.7 (L) 06/07/2019   HCT 27.8 (L) 06/07/2019    MCV 98.9 06/07/2019   PLT 39 (L) 06/07/2019     Radiographic Findings: NM PET Image Restage (PS) Whole Body  Result Date: 06/21/2019 CLINICAL DATA:  subsequent treatment strategy for multiple myeloma. EXAM: NUCLEAR MEDICINE PET WHOLE BODY TECHNIQUE: 5.65 mCi F-18 FDG was injected intravenously. Full-ring PET imaging was performed from the skull base to thigh after the radiotracer. CT data was obtained and used for attenuation correction and anatomic localization. Fasting blood glucose:  108 mg/dl COMPARISON:  03/10/2019 FINDINGS: Mediastinal blood pool activity: SUV max 2.78 HEAD/NECK: No hypermetabolic activity in the scalp. No hypermetabolic cervical lymph nodes. Incidental CT findings: none CHEST: Tiny right axillary lymph node measures 0.6 cm and has an SUV max of 3.18. Not seen previously. The no additional FDG avid lymph nodes identified within  the chest. Mosaic attenuation pattern is identified throughout both lungs. Small bilateral pleural effusions are noted, right greater than left. No FDG avid pulmonary nodules. Incidental CT findings: Aortic atherosclerosis. Lad and left circumflex coronary artery calcifications. ABDOMEN/PELVIS: No FDG uptake within the liver, pancreas, spleen, or adrenal glands. No hypermetabolic abdominopelvic lymph nodes. Incidental CT findings: Aortic atherosclerosis. SKELETON: Multifocal areas of increased uptake are again noted within the axial skeleton. Overall the number of hypermetabolic bone lesions has improved in the interval. Including a significant interval decrease in abnormal hypermetabolism involving the cervical spine, lumbar spine and upper thoracic spine. -SUV max within the L2 vertebral body is equal to 3.75. Previously 6.46. -index left manubrial lesion has an SUV max of 2.52 on the current exam. Previously 9.02 -proximal right humerus lesion has an SUV max of 20.6. Previously 17.3. -anterior right second rib lesion has an SUV max of 7.3. Previously 6.0.  -medial right iliac bone lesion has an SUV max of 3.37. Previously 16.8. -left femoral neck lesion has an SUV max 9.2.  Previously 19.2 cm. -posterior element T1 lesion has SUV max of 2.78.  Previously 11.26. Incidental CT findings: Patient has undergone interval posterior hardware fixation of the upper thoracic spine. EXTREMITIES: Again seen is patchy hypermetabolism within the proximal diaphyses of bilateral humeri and bilateral femurs. When compared with the previous exam there is been interval improvement and abnormal hypermetabolism within the proximal left humerus and proximal left femur. For example, SUV max within the proximal right femur is equal to 2.84. On the previous exam SUV max was equal to 7.9. SUV max within the proximal left humerus is equal to 5.35. Previously 13.05 Incidental CT findings: none IMPRESSION: 1. Interval partial response to therapy. Overall, there is been decrease in number of FDG avid lesions. Most notable is considerable decrease in abnormal uptake within the cervical and thoracic spine as well as the lumbar spine. There is also been improved uptake within the right iliac bone, proximal right femur and proximal left humerus. 2. Persistent multifocal areas of abnormal uptake are identified involving the sternum, thoracic spine, bilateral extremities, and left hemipelvis. 3. Small bilateral pleural effusions. A mosaic attenuation pattern is identified in both lungs which may reflect underlying small airways disease. Aortic Atherosclerosis (ICD10-I70.0). Coronary artery calcifications. Electronically Signed   By: Kerby Moors M.D.   On: 06/21/2019 08:51    Impression/Plan: 14. 64 yo woman with myeloma s/p dorsal laminectomy at T6 with hardware stabilization T4-T8. She appears to have recovered well from her recent palliative, adjuvant postop radiotherapy to the thoracic spine.  Her back pain is much improved since she restarted her systemic therapy and she is quite pleased with  her progress.  We discussed that while we are happy to continue to participate in her care if clinically indicated, at this point, we will plan to follow her progress via correspondence and will gladly see her back on an as-needed basis.  She will continue in routine follow-up under the care and direction of Dr. Norma Fredrickson and her neurosurgeon at [facility].  She knows that she is welcome to call at anytime with any questions or concerns related to her previous radiotherapy.     Nicholos Johns, PA-C

## 2019-07-07 ENCOUNTER — Inpatient Hospital Stay (HOSPITAL_COMMUNITY): Payer: 59 | Attending: Hematology

## 2019-07-07 ENCOUNTER — Other Ambulatory Visit: Payer: Self-pay

## 2019-07-07 DIAGNOSIS — C9002 Multiple myeloma in relapse: Secondary | ICD-10-CM | POA: Diagnosis not present

## 2019-07-07 DIAGNOSIS — C9000 Multiple myeloma not having achieved remission: Secondary | ICD-10-CM | POA: Diagnosis not present

## 2019-07-07 LAB — CBC WITH DIFFERENTIAL/PLATELET
Abs Immature Granulocytes: 0.04 10*3/uL (ref 0.00–0.07)
Basophils Absolute: 0 10*3/uL (ref 0.0–0.1)
Basophils Relative: 0 %
Eosinophils Absolute: 0 10*3/uL (ref 0.0–0.5)
Eosinophils Relative: 0 %
HCT: 28.6 % — ABNORMAL LOW (ref 36.0–46.0)
Hemoglobin: 9.3 g/dL — ABNORMAL LOW (ref 12.0–15.0)
Immature Granulocytes: 2 %
Lymphocytes Relative: 3 %
Lymphs Abs: 0.1 10*3/uL — ABNORMAL LOW (ref 0.7–4.0)
MCH: 31.7 pg (ref 26.0–34.0)
MCHC: 32.5 g/dL (ref 30.0–36.0)
MCV: 97.6 fL (ref 80.0–100.0)
Monocytes Absolute: 0.2 10*3/uL (ref 0.1–1.0)
Monocytes Relative: 6 %
Neutro Abs: 2.3 10*3/uL (ref 1.7–7.7)
Neutrophils Relative %: 89 %
Platelets: 17 10*3/uL — CL (ref 150–400)
RBC: 2.93 MIL/uL — ABNORMAL LOW (ref 3.87–5.11)
RDW: 21.6 % — ABNORMAL HIGH (ref 11.5–15.5)
WBC: 2.6 10*3/uL — ABNORMAL LOW (ref 4.0–10.5)
nRBC: 3 % — ABNORMAL HIGH (ref 0.0–0.2)

## 2019-07-07 LAB — COMPREHENSIVE METABOLIC PANEL
ALT: 40 U/L (ref 0–44)
AST: 36 U/L (ref 15–41)
Albumin: 3.1 g/dL — ABNORMAL LOW (ref 3.5–5.0)
Alkaline Phosphatase: 80 U/L (ref 38–126)
Anion gap: 10 (ref 5–15)
BUN: 20 mg/dL (ref 8–23)
CO2: 24 mmol/L (ref 22–32)
Calcium: 8.4 mg/dL — ABNORMAL LOW (ref 8.9–10.3)
Chloride: 101 mmol/L (ref 98–111)
Creatinine, Ser: 0.54 mg/dL (ref 0.44–1.00)
GFR calc Af Amer: >60 mL/min (ref >60)
GFR calc non Af Amer: >60 mL/min (ref >60)
Glucose, Bld: 143 mg/dL — ABNORMAL HIGH (ref 70–99)
Potassium: 3.1 mmol/L — ABNORMAL LOW (ref 3.5–5.1)
Sodium: 135 mmol/L (ref 135–145)
Total Bilirubin: 0.7 mg/dL (ref 0.3–1.2)
Total Protein: 6.6 g/dL (ref 6.5–8.1)

## 2019-07-07 NOTE — Progress Notes (Signed)
We aren't treating her but [facility] sent orders for [place] to check a CBC/diff and CMET today and on the 18th.  I will fax her labs to [facility].

## 2019-07-10 ENCOUNTER — Ambulatory Visit: Payer: 59 | Admitting: Family Medicine

## 2019-07-10 DIAGNOSIS — C9000 Multiple myeloma not having achieved remission: Secondary | ICD-10-CM | POA: Diagnosis not present

## 2019-07-10 DIAGNOSIS — Z5111 Encounter for antineoplastic chemotherapy: Secondary | ICD-10-CM | POA: Diagnosis not present

## 2019-07-10 NOTE — Telephone Encounter (Signed)
Reminded patient of telephone encounter with provider.

## 2019-07-13 ENCOUNTER — Inpatient Hospital Stay (HOSPITAL_COMMUNITY): Payer: 59 | Attending: Hematology

## 2019-07-13 ENCOUNTER — Telehealth (HOSPITAL_COMMUNITY): Payer: Self-pay | Admitting: *Deleted

## 2019-07-13 ENCOUNTER — Other Ambulatory Visit: Payer: Self-pay

## 2019-07-13 DIAGNOSIS — C9000 Multiple myeloma not having achieved remission: Secondary | ICD-10-CM | POA: Insufficient documentation

## 2019-07-13 LAB — CBC WITH DIFFERENTIAL/PLATELET
Abs Immature Granulocytes: 0.23 10*3/uL — ABNORMAL HIGH (ref 0.00–0.07)
Basophils Absolute: 0 10*3/uL (ref 0.0–0.1)
Basophils Relative: 0 %
Eosinophils Absolute: 0 10*3/uL (ref 0.0–0.5)
Eosinophils Relative: 1 %
HCT: 27.7 % — ABNORMAL LOW (ref 36.0–46.0)
Hemoglobin: 9.1 g/dL — ABNORMAL LOW (ref 12.0–15.0)
Immature Granulocytes: 10 %
Lymphocytes Relative: 3 %
Lymphs Abs: 0.1 10*3/uL — ABNORMAL LOW (ref 0.7–4.0)
MCH: 32.3 pg (ref 26.0–34.0)
MCHC: 32.9 g/dL (ref 30.0–36.0)
MCV: 98.2 fL (ref 80.0–100.0)
Monocytes Absolute: 0.2 10*3/uL (ref 0.1–1.0)
Monocytes Relative: 9 %
Neutro Abs: 1.7 10*3/uL (ref 1.7–7.7)
Neutrophils Relative %: 77 %
Platelets: 17 10*3/uL — CL (ref 150–400)
RBC: 2.82 MIL/uL — ABNORMAL LOW (ref 3.87–5.11)
RDW: 21.5 % — ABNORMAL HIGH (ref 11.5–15.5)
WBC: 2.3 10*3/uL — ABNORMAL LOW (ref 4.0–10.5)
nRBC: 7.1 % — ABNORMAL HIGH (ref 0.0–0.2)

## 2019-07-13 LAB — COMPREHENSIVE METABOLIC PANEL
ALT: 49 U/L — ABNORMAL HIGH (ref 0–44)
AST: 32 U/L (ref 15–41)
Albumin: 3.2 g/dL — ABNORMAL LOW (ref 3.5–5.0)
Alkaline Phosphatase: 147 U/L — ABNORMAL HIGH (ref 38–126)
Anion gap: 9 (ref 5–15)
BUN: 22 mg/dL (ref 8–23)
CO2: 28 mmol/L (ref 22–32)
Calcium: 9 mg/dL (ref 8.9–10.3)
Chloride: 96 mmol/L — ABNORMAL LOW (ref 98–111)
Creatinine, Ser: 0.62 mg/dL (ref 0.44–1.00)
GFR calc Af Amer: >60 mL/min (ref >60)
GFR calc non Af Amer: >60 mL/min (ref >60)
Glucose, Bld: 146 mg/dL — ABNORMAL HIGH (ref 70–99)
Potassium: 3.9 mmol/L (ref 3.5–5.1)
Sodium: 133 mmol/L — ABNORMAL LOW (ref 135–145)
Total Bilirubin: 0.5 mg/dL (ref 0.3–1.2)
Total Protein: 5.9 g/dL — ABNORMAL LOW (ref 6.5–8.1)

## 2019-07-13 NOTE — Progress Notes (Signed)
CRITICAL VALUE ALERT  Critical Value:  Platelets 17  Date & Time Notied:  07/13/2019 at 1053  Provider Notified: labs faxed to pt's oncologist at [facility]  Orders Received/Actions taken: labs faxed to [facility]

## 2019-07-13 NOTE — Telephone Encounter (Signed)
Received a call from Vena Rua, RN from [facility] regarding the possibility of patient receiving platelets here in the Outpatient Clinic today or tomorrow. Platelets are critical at 17 and she is currently receiving chemotherapy at [facility].  She was receiving care in our [place] at [facility], however transferred her care to [facility], some time ago.  According to our Center, she would have to transfer care back to Dr. Delton Coombes, in order to receive platelets in our center.  Due to the strict criteria in the Outpatient Infusion Clinic, we do not transfuse blood products to those who have critical values. Patient has been scheduled to receive platelets at [facility] and verbalized understanding of the above.

## 2019-07-14 DIAGNOSIS — C9000 Multiple myeloma not having achieved remission: Secondary | ICD-10-CM | POA: Diagnosis not present

## 2019-07-17 DIAGNOSIS — C9001 Multiple myeloma in remission: Secondary | ICD-10-CM | POA: Diagnosis not present

## 2019-07-17 DIAGNOSIS — Z5111 Encounter for antineoplastic chemotherapy: Secondary | ICD-10-CM | POA: Diagnosis not present

## 2019-07-17 DIAGNOSIS — C9002 Multiple myeloma in relapse: Secondary | ICD-10-CM | POA: Diagnosis not present

## 2019-07-17 DIAGNOSIS — C9000 Multiple myeloma not having achieved remission: Secondary | ICD-10-CM | POA: Diagnosis not present

## 2019-07-18 DIAGNOSIS — C9002 Multiple myeloma in relapse: Secondary | ICD-10-CM | POA: Diagnosis not present

## 2019-07-21 ENCOUNTER — Ambulatory Visit (INDEPENDENT_AMBULATORY_CARE_PROVIDER_SITE_OTHER): Payer: 59 | Admitting: Family Medicine

## 2019-07-21 ENCOUNTER — Other Ambulatory Visit: Payer: Self-pay

## 2019-07-21 DIAGNOSIS — E039 Hypothyroidism, unspecified: Secondary | ICD-10-CM

## 2019-07-21 MED ORDER — LEVOTHYROXINE SODIUM 75 MCG PO TABS: 75.0000 ug | ORAL_TABLET | Freq: Every day | ORAL | 1 refills | Status: DC

## 2019-07-21 NOTE — Progress Notes (Signed)
   Subjective:    Patient ID: Melissa [name], female    DOB: 1955-11-10, 64 y.o.   MRN: [id number]  HPIhypothyroidism. Takes levothyroxine 75 mcg daily. Pt states no concerns or problems.  Does take her medicine on a regular basis states her energy level overall doing fairly good she is followed by cancer doctor and they are monitoring her periodically is able to pull up the lab results and look at those.  Patient denies any setbacks recently.  She has been discouraged by the return of multiple myeloma but they are treating her successfully so far at [facility] Visit via Telephone Note  I connected with Chrissy Ealey Guitron on 07/21/19 at 11:00 AM EDT by telephone and verified that I am speaking with the correct person using two identifiers.  Location: Patient: home Provider: office   I discussed the limitations, risks, security and privacy concerns of performing an evaluation and management service by telephone and the availability of in person appointments. I also discussed with the patient that there may be a patient responsible charge related to this service. The patient expressed understanding and agreed to proceed.   History of Present Illness:    Observations/Objective:   Assessment and Plan:   Follow Up Instructions:    I discussed the assessment and treatment plan with the patient. The patient was provided an opportunity to ask questions and all were answered. The patient agreed with the plan and demonstrated an understanding of the instructions.   The patient was advised to call back or seek an in-person evaluation if the symptoms worsen or if the condition fails to improve as anticipated.  I provided 18 minutes of non-face-to-face time during this encounter.      Review of Systems     Objective:   Physical Exam  Virtual exam unable to be physical exam      Assessment & Plan:  Hypothyroidism Patient will get her specialist to draw TSH she will let us know  the results Follow-up 6 months Renewal medicine given  Patient also has reoccurrence of her multiple myeloma she is getting is treated currently but she is frustrated by having to go through this again she will let us know if we can be of any help

## 2019-07-25 DIAGNOSIS — Z5111 Encounter for antineoplastic chemotherapy: Secondary | ICD-10-CM | POA: Diagnosis not present

## 2019-07-25 DIAGNOSIS — Z981 Arthrodesis status: Secondary | ICD-10-CM | POA: Diagnosis not present

## 2019-07-25 DIAGNOSIS — Z923 Personal history of irradiation: Secondary | ICD-10-CM | POA: Diagnosis not present

## 2019-07-25 DIAGNOSIS — G9529 Other cord compression: Secondary | ICD-10-CM | POA: Diagnosis not present

## 2019-07-25 DIAGNOSIS — M546 Pain in thoracic spine: Secondary | ICD-10-CM | POA: Diagnosis not present

## 2019-07-25 DIAGNOSIS — C9002 Multiple myeloma in relapse: Secondary | ICD-10-CM | POA: Diagnosis not present

## 2019-07-25 DIAGNOSIS — Z79899 Other long term (current) drug therapy: Secondary | ICD-10-CM | POA: Diagnosis not present

## 2019-07-27 ENCOUNTER — Encounter: Payer: Self-pay | Admitting: Family Medicine

## 2019-07-27 MED FILL — ACYCLOVIR 400 MG TABLET: 400 | 30 days supply | Qty: 60 | Fill #2

## 2019-07-31 DIAGNOSIS — R5383 Other fatigue: Secondary | ICD-10-CM | POA: Diagnosis not present

## 2019-07-31 DIAGNOSIS — Z5111 Encounter for antineoplastic chemotherapy: Secondary | ICD-10-CM | POA: Diagnosis not present

## 2019-07-31 DIAGNOSIS — Z9484 Stem cells transplant status: Secondary | ICD-10-CM | POA: Diagnosis not present

## 2019-07-31 DIAGNOSIS — D61810 Antineoplastic chemotherapy induced pancytopenia: Secondary | ICD-10-CM | POA: Diagnosis not present

## 2019-07-31 DIAGNOSIS — C9002 Multiple myeloma in relapse: Secondary | ICD-10-CM | POA: Diagnosis not present

## 2019-07-31 DIAGNOSIS — D649 Anemia, unspecified: Secondary | ICD-10-CM | POA: Diagnosis not present

## 2019-07-31 DIAGNOSIS — Z923 Personal history of irradiation: Secondary | ICD-10-CM | POA: Diagnosis not present

## 2019-07-31 DIAGNOSIS — Z7952 Long term (current) use of systemic steroids: Secondary | ICD-10-CM | POA: Diagnosis not present

## 2019-07-31 DIAGNOSIS — Z79899 Other long term (current) drug therapy: Secondary | ICD-10-CM | POA: Diagnosis not present

## 2019-08-01 DIAGNOSIS — D649 Anemia, unspecified: Secondary | ICD-10-CM | POA: Diagnosis not present

## 2019-08-02 DIAGNOSIS — C9002 Multiple myeloma in relapse: Secondary | ICD-10-CM | POA: Diagnosis not present

## 2019-08-08 DIAGNOSIS — C9001 Multiple myeloma in remission: Secondary | ICD-10-CM | POA: Diagnosis not present

## 2019-08-08 DIAGNOSIS — C9002 Multiple myeloma in relapse: Secondary | ICD-10-CM | POA: Diagnosis not present

## 2019-08-15 DIAGNOSIS — C9002 Multiple myeloma in relapse: Secondary | ICD-10-CM | POA: Diagnosis not present

## 2019-08-15 DIAGNOSIS — Z5111 Encounter for antineoplastic chemotherapy: Secondary | ICD-10-CM | POA: Diagnosis not present

## 2019-08-22 DIAGNOSIS — C9002 Multiple myeloma in relapse: Secondary | ICD-10-CM | POA: Diagnosis not present

## 2019-08-22 DIAGNOSIS — Z5111 Encounter for antineoplastic chemotherapy: Secondary | ICD-10-CM | POA: Diagnosis not present

## 2019-08-22 DIAGNOSIS — Z79899 Other long term (current) drug therapy: Secondary | ICD-10-CM | POA: Diagnosis not present

## 2019-08-22 DIAGNOSIS — C9001 Multiple myeloma in remission: Secondary | ICD-10-CM | POA: Diagnosis not present

## 2019-08-22 DIAGNOSIS — T80319A ABO incompatibility with hemolytic transfusion reaction, unspecified, initial encounter: Secondary | ICD-10-CM | POA: Diagnosis not present

## 2019-08-29 ENCOUNTER — Other Ambulatory Visit (HOSPITAL_COMMUNITY): Payer: Self-pay | Admitting: Physician Assistant

## 2019-08-29 DIAGNOSIS — D61818 Other pancytopenia: Secondary | ICD-10-CM | POA: Diagnosis not present

## 2019-08-29 DIAGNOSIS — R000 Tachycardia, unspecified: Secondary | ICD-10-CM | POA: Diagnosis not present

## 2019-08-29 DIAGNOSIS — C9000 Multiple myeloma not having achieved remission: Secondary | ICD-10-CM | POA: Diagnosis not present

## 2019-08-29 DIAGNOSIS — C9001 Multiple myeloma in remission: Secondary | ICD-10-CM | POA: Diagnosis not present

## 2019-08-29 DIAGNOSIS — Z5111 Encounter for antineoplastic chemotherapy: Secondary | ICD-10-CM | POA: Diagnosis not present

## 2019-08-29 DIAGNOSIS — R5383 Other fatigue: Secondary | ICD-10-CM | POA: Diagnosis not present

## 2019-08-29 DIAGNOSIS — M546 Pain in thoracic spine: Secondary | ICD-10-CM | POA: Diagnosis not present

## 2019-08-29 DIAGNOSIS — C9002 Multiple myeloma in relapse: Secondary | ICD-10-CM | POA: Diagnosis not present

## 2019-08-29 DIAGNOSIS — R748 Abnormal levels of other serum enzymes: Secondary | ICD-10-CM | POA: Diagnosis not present

## 2019-08-29 MED FILL — ACYCLOVIR 400 MG TABLET: 400 | 30 days supply | Qty: 60 | Fill #0

## 2019-08-31 MED FILL — DEXAMETHASONE 4 MG TABLET: 4 | 28 days supply | Qty: 10 | Fill #0

## 2019-09-05 DIAGNOSIS — C9002 Multiple myeloma in relapse: Secondary | ICD-10-CM | POA: Diagnosis not present

## 2019-09-05 DIAGNOSIS — Z5111 Encounter for antineoplastic chemotherapy: Secondary | ICD-10-CM | POA: Diagnosis not present

## 2019-09-19 DIAGNOSIS — C9002 Multiple myeloma in relapse: Secondary | ICD-10-CM | POA: Diagnosis not present

## 2019-09-19 DIAGNOSIS — Z79899 Other long term (current) drug therapy: Secondary | ICD-10-CM | POA: Diagnosis not present

## 2019-09-19 DIAGNOSIS — C9001 Multiple myeloma in remission: Secondary | ICD-10-CM | POA: Diagnosis not present

## 2019-09-19 DIAGNOSIS — Z5111 Encounter for antineoplastic chemotherapy: Secondary | ICD-10-CM | POA: Diagnosis not present

## 2019-09-22 ENCOUNTER — Ambulatory Visit
Admission: EM | Admit: 2019-09-22 | Discharge: 2019-09-22 | Disposition: A | Discharge status: Home / Self Care | Payer: 59 | Attending: Emergency Medicine | Admitting: Emergency Medicine

## 2019-09-22 DIAGNOSIS — L02215 Cutaneous abscess of perineum: Secondary | ICD-10-CM

## 2019-09-22 MED ORDER — AMOXICILLIN-POT CLAVULANATE 875-125 MG PO TABS: 1.0000 | ORAL_TABLET | Freq: Two times a day (BID) | ORAL | 0 refills | Status: AC

## 2019-09-22 NOTE — Discharge Instructions (Signed)
Apply warm compresses 3-4x daily for 10-15 minutes Wash site daily with warm water and mild soap Keep covered to avoid friction Take antibiotic as prescribed and to completion Follow up here or with PCP if symptoms persists Return or go to the ED if you have any new or worsening symptoms increased redness, swelling, pain, nausea, vomiting, fever, chills, etc...  

## 2019-09-22 NOTE — ED Provider Notes (Signed)
[place]   [id number] 09/22/19 Arrival Time: 1612   CC: ABSCESS  SUBJECTIVE:  Melissa [name] is a 64 y.o. female hx significant for multiple myeloma (WBC <2), who presents with a possible abscess of her perineum x few days. Denies precipitating event or trauma.  Tender to the touch.  Has not tried OTC medications.  Reports similar symptoms in the past and diagnosed with abscess.  Treated with Augmentin with relief.  Reports associated pressure and discomfort.  Denies fever, chills, nausea, vomiting, erythema.    ROS: As per HPI.  All other pertinent ROS negative.     Past Medical History:  Diagnosis Date  . History of stem cell transplant ([place])    for multiple myeloma  . Hypothyroidism   . Multiple myeloma   . Peripheral neuropathy   . Seasonal allergies   . Thyroid disease    Past Surgical History:  Procedure Laterality Date  . CATARACT EXTRACTION W/PHACO Right 04/04/2013   Procedure: CATARACT EXTRACTION PHACO AND INTRAOCULAR LENS PLACEMENT (IOC);  Surgeon: Elta Guadeloupe T. Gershon Crane, MD;  Location: [facility];  Service: Ophthalmology;  Laterality: Right;  CDE:3.42  . CATARACT EXTRACTION W/PHACO Left 04/18/2013   Procedure: CATARACT EXTRACTION PHACO AND INTRAOCULAR LENS PLACEMENT (IOC);  Surgeon: Elta Guadeloupe T. Gershon Crane, MD;  Location: [facility];  Service: Ophthalmology;  Laterality: Left;  CDE:4.91  . CESAREAN SECTION  06/1993  . FASCIECTOMY Left 03/02/2017   Procedure: FASCIECTOMY, LEFT SMALL FINGER;  Surgeon: Daryll Brod, MD;  Location: [facility];  Service: Orthopedics;  Laterality: Left;  . HAND SURGERY Left   . TONSILECTOMY, ADENOIDECTOMY, BILATERAL MYRINGOTOMY AND TUBES    . TONSILLECTOMY    . TUBAL LIGATION    . YAG LASER APPLICATION Right 4/65/6812   Procedure: YAG LASER APPLICATION;  Surgeon: Rutherford Guys, MD;  Location: [facility];  Service: Ophthalmology;  Laterality: Right;  . YAG LASER APPLICATION Left 7/51/7001   Procedure: YAG LASER APPLICATION;  Surgeon: Rutherford Guys, MD;  Location: [facility];  Service: Ophthalmology;  Laterality: Left;   Allergies  Allergen Reactions  . Hydromorphone Hcl Nausea And Vomiting  . Poison Ivy Extract [Poison Ivy Extract]   . Tape Rash   Current Facility-Administered Medications on File Prior to Encounter  Medication Dose Route Frequency Provider Last Rate Last Admin  . 0.9 %  sodium chloride infusion   Intravenous Continuous Baird Cancer, PA-C 20 mL/hr at 12/05/10 0947 New Bag at 12/05/10 0947  . 0.9 %  sodium chloride infusion   Intravenous Continuous Everardo All, MD 20 mL/hr at 02/26/11 1528 New Bag at 02/26/11 1528  . sodium chloride 0.9 % injection 10 mL  10 mL Intravenous PRN Everardo All, MD   10 mL at 02/26/11 1518   Current Outpatient Medications on File Prior to Encounter  Medication Sig Dispense Refill  . acyclovir (ZOVIRAX) 400 MG tablet Take 1 tablet (400 mg total) by mouth 2 (two) times daily. 60 tablet 3  . acyclovir (ZOVIRAX) 400 MG tablet Take by mouth.    Marland Kitchen allopurinol (ZYLOPRIM) 300 MG tablet Take by mouth.    . Calcium Carbonate-Vitamin D (CALCIUM + D PO) Take 1 tablet by mouth daily.    . Cholecalciferol (VITAMIN D) 2000 UNITS tablet Take 2,000 Units by mouth daily.    Marland Kitchen dexamethasone (DECADRON) 4 MG tablet Take 10 tablets (40 mg total) by mouth once a week. 40 tablet 2  . levothyroxine (SYNTHROID) 75 MCG tablet Take 1 tablet (75 mcg total)  by mouth daily. 90 tablet 1  . omeprazole (PRILOSEC) 20 MG capsule Take 20 mg by mouth daily.    Marland Kitchen oxyCODONE-acetaminophen (PERCOCET) 10-325 MG tablet Take 1 tablet by mouth every 8 (eight) hours as needed for pain. 30 tablet 0  . oxyCODONE-acetaminophen (PERCOCET) 10-325 MG tablet Take by mouth.    . pomalidomide (POMALYST) 2 MG capsule Take 1 capsule (2 mg total) by mouth daily. For 21 days and then take 7 days off. 21 capsule 0  . prochlorperazine (COMPAZINE) 10 MG tablet Take 1 tablet (10 mg total) by mouth every 6 (six) hours as needed for  nausea or vomiting. 45 tablet 1  . prochlorperazine (COMPAZINE) 10 MG tablet Take by mouth.    . traMADol (ULTRAM) 50 MG tablet Take 50 mg by mouth every 8 (eight) hours as needed.    . triamcinolone cream (KENALOG) 0.1 % Apply BID to affected areas 30 g 1   Social History   Socioeconomic History  . Marital status: Married    Spouse name: Not on file  . Number of children: Not on file  . Years of education: Not on file  . Highest education level: Not on file  Occupational History  . Not on file  Tobacco Use  . Smoking status: Never Smoker  . Smokeless tobacco: Never Used  Substance and Sexual Activity  . Alcohol use: Yes    Comment: occ  . Drug use: No  . Sexual activity: Not Currently    Birth control/protection: Post-menopausal  Other Topics Concern  . Not on file  Social History Narrative  . Not on file   Social Determinants of Health   Financial Resource Strain:   . Difficulty of Paying Living Expenses:   Food Insecurity:   . Worried About Charity fundraiser in the Last Year:   . Arboriculturist in the Last Year:   Transportation Needs:   . Film/video editor (Medical):   Marland Kitchen Lack of Transportation (Non-Medical):   Physical Activity:   . Days of Exercise per Week:   . Minutes of Exercise per Session:   Stress:   . Feeling of Stress :   Social Connections:   . Frequency of Communication with Friends and Family:   . Frequency of Social Gatherings with Friends and Family:   . Attends Religious Services:   . Active Member of Clubs or Organizations:   . Attends Archivist Meetings:   Marland Kitchen Marital Status:   Intimate Partner Violence:   . Fear of Current or Ex-Partner:   . Emotionally Abused:   Marland Kitchen Physically Abused:   . Sexually Abused:    Family History  Problem Relation Age of Onset  . Hypertension Mother   . Heart attack Mother   . Diabetes Mother   . Skin cancer Mother   . Hypertension Father   . Heart attack Father   . Skin cancer Father    . Heart disease Maternal Grandmother   . Stroke Maternal Grandfather   . Hypertension Sister   . Skin cancer Sister   . Hypertension Brother   . Skin cancer Brother   . Hypertension Brother   . Cancer Brother        melanoma  . Skin cancer Brother   . Factor V Leiden deficiency Daughter     OBJECTIVE:  Vitals:   09/22/19 1622  BP: (!) 148/83  Pulse: (!) 115  Resp: 16  Temp: 97.9 F (36.6 C)  TempSrc:  Oral  SpO2: 96%     General appearance: alert; no distress Skin: small pustule over LT side of perineum, mildly TTP, no obvious erythema, discharge or bleeding Psychological: alert and cooperative; normal mood and affect   ASSESSMENT & PLAN:  1. Abscess, perineum     Meds ordered this encounter  Medications  . amoxicillin-clavulanate (AUGMENTIN) 875-125 MG tablet    Sig: Take 1 tablet by mouth every 12 (twelve) hours for 10 days.    Dispense:  20 tablet    Refill:  0    Order Specific Question:   Supervising Provider    Answer:   Raylene Everts [[id number]]   Apply warm compresses 3-4x daily for 10-15 minutes Wash site daily with warm water and mild soap Keep covered to avoid friction Take antibiotic as prescribed and to completion Follow up here or with PCP if symptoms persists Return or go to the ED if you have any new or worsening symptoms increased redness, swelling, pain, nausea, vomiting, fever, chills, etc...   Reviewed expectations re: course of current medical issues. Questions answered. Outlined signs and symptoms indicating need for more acute intervention. Patient verbalized understanding. After Visit Summary given.          Lestine Box, PA-C 09/22/19 [phone]

## 2019-09-22 NOTE — ED Triage Notes (Signed)
Provider triage  

## 2019-09-29 MED FILL — ACYCLOVIR 400 MG TABLET: 400 | 30 days supply | Qty: 60 | Fill #1

## 2019-09-29 MED FILL — LEVOTHYROXINE 75 MCG TABLET: 75 | 90 days supply | Qty: 90 | Fill #0

## 2019-09-30 ENCOUNTER — Other Ambulatory Visit: Payer: Self-pay

## 2019-09-30 ENCOUNTER — Emergency Department (HOSPITAL_COMMUNITY): Payer: 59

## 2019-09-30 ENCOUNTER — Encounter (HOSPITAL_COMMUNITY): Payer: Self-pay

## 2019-09-30 ENCOUNTER — Emergency Department (HOSPITAL_COMMUNITY)
Admission: EM | Admit: 2019-09-30 | Discharge: 2019-09-30 | Disposition: A | Discharge status: Home / Self Care | Payer: 59 | Attending: Emergency Medicine | Admitting: Emergency Medicine

## 2019-09-30 DIAGNOSIS — H532 Diplopia: Secondary | ICD-10-CM | POA: Insufficient documentation

## 2019-09-30 DIAGNOSIS — Z9484 Stem cells transplant status: Secondary | ICD-10-CM | POA: Insufficient documentation

## 2019-09-30 DIAGNOSIS — L02215 Cutaneous abscess of perineum: Secondary | ICD-10-CM | POA: Diagnosis not present

## 2019-09-30 DIAGNOSIS — I6522 Occlusion and stenosis of left carotid artery: Secondary | ICD-10-CM | POA: Diagnosis not present

## 2019-09-30 DIAGNOSIS — D61810 Antineoplastic chemotherapy induced pancytopenia: Secondary | ICD-10-CM | POA: Diagnosis not present

## 2019-09-30 DIAGNOSIS — G9389 Other specified disorders of brain: Secondary | ICD-10-CM | POA: Diagnosis not present

## 2019-09-30 DIAGNOSIS — H4911 Fourth [trochlear] nerve palsy, right eye: Secondary | ICD-10-CM | POA: Diagnosis not present

## 2019-09-30 DIAGNOSIS — E039 Hypothyroidism, unspecified: Secondary | ICD-10-CM | POA: Diagnosis not present

## 2019-09-30 DIAGNOSIS — H5702 Anisocoria: Secondary | ICD-10-CM | POA: Diagnosis not present

## 2019-09-30 DIAGNOSIS — Z79899 Other long term (current) drug therapy: Secondary | ICD-10-CM | POA: Diagnosis not present

## 2019-09-30 DIAGNOSIS — H4910 Fourth [trochlear] nerve palsy, unspecified eye: Secondary | ICD-10-CM | POA: Diagnosis not present

## 2019-09-30 DIAGNOSIS — C9002 Multiple myeloma in relapse: Secondary | ICD-10-CM | POA: Diagnosis not present

## 2019-09-30 DIAGNOSIS — C9000 Multiple myeloma not having achieved remission: Secondary | ICD-10-CM | POA: Insufficient documentation

## 2019-09-30 DIAGNOSIS — R000 Tachycardia, unspecified: Secondary | ICD-10-CM | POA: Diagnosis not present

## 2019-09-30 DIAGNOSIS — Z20822 Contact with and (suspected) exposure to covid-19: Secondary | ICD-10-CM | POA: Diagnosis not present

## 2019-09-30 DIAGNOSIS — M546 Pain in thoracic spine: Secondary | ICD-10-CM | POA: Diagnosis not present

## 2019-09-30 DIAGNOSIS — D329 Benign neoplasm of meninges, unspecified: Secondary | ICD-10-CM | POA: Diagnosis not present

## 2019-09-30 DIAGNOSIS — G939 Disorder of brain, unspecified: Secondary | ICD-10-CM | POA: Diagnosis not present

## 2019-09-30 DIAGNOSIS — C713 Malignant neoplasm of parietal lobe: Secondary | ICD-10-CM | POA: Diagnosis not present

## 2019-09-30 DIAGNOSIS — G9520 Unspecified cord compression: Secondary | ICD-10-CM | POA: Diagnosis not present

## 2019-09-30 DIAGNOSIS — G902 Horner's syndrome: Secondary | ICD-10-CM | POA: Diagnosis not present

## 2019-09-30 DIAGNOSIS — Z923 Personal history of irradiation: Secondary | ICD-10-CM | POA: Diagnosis not present

## 2019-09-30 DIAGNOSIS — H4922 Sixth [abducent] nerve palsy, left eye: Secondary | ICD-10-CM | POA: Diagnosis not present

## 2019-09-30 HISTORY — DX: Anal abscess: K61.0

## 2019-09-30 LAB — URINALYSIS, ROUTINE W REFLEX MICROSCOPIC
Bilirubin Urine: NEGATIVE
Glucose, UA: NEGATIVE mg/dL
Hgb urine dipstick: NEGATIVE
Ketones, ur: NEGATIVE mg/dL
Leukocytes,Ua: NEGATIVE
Nitrite: NEGATIVE
Protein, ur: NEGATIVE mg/dL
Specific Gravity, Urine: 1.015 (ref 1.005–1.030)
pH: 7 (ref 5.0–8.0)

## 2019-09-30 LAB — COMPREHENSIVE METABOLIC PANEL
ALT: 13 U/L (ref 0–44)
AST: 19 U/L (ref 15–41)
Albumin: 3.7 g/dL (ref 3.5–5.0)
Alkaline Phosphatase: 58 U/L (ref 38–126)
Anion gap: 11 (ref 5–15)
BUN: 14 mg/dL (ref 8–23)
CO2: 27 mmol/L (ref 22–32)
Calcium: 9.1 mg/dL (ref 8.9–10.3)
Chloride: 101 mmol/L (ref 98–111)
Creatinine, Ser: 0.75 mg/dL (ref 0.44–1.00)
GFR calc Af Amer: >60 mL/min (ref >60)
GFR calc non Af Amer: >60 mL/min (ref >60)
Glucose, Bld: 136 mg/dL — ABNORMAL HIGH (ref 70–99)
Potassium: 4 mmol/L (ref 3.5–5.1)
Sodium: 139 mmol/L (ref 135–145)
Total Bilirubin: 0.5 mg/dL (ref 0.3–1.2)
Total Protein: 6.3 g/dL — ABNORMAL LOW (ref 6.5–8.1)

## 2019-09-30 LAB — CBC WITH DIFFERENTIAL/PLATELET
Abs Immature Granulocytes: 0.02 10*3/uL (ref 0.00–0.07)
Basophils Absolute: 0 10*3/uL (ref 0.0–0.1)
Basophils Relative: 0 %
Eosinophils Absolute: 0 10*3/uL (ref 0.0–0.5)
Eosinophils Relative: 0 %
HCT: 32.9 % — ABNORMAL LOW (ref 36.0–46.0)
Hemoglobin: 10.8 g/dL — ABNORMAL LOW (ref 12.0–15.0)
Immature Granulocytes: 1 %
Lymphocytes Relative: 3 %
Lymphs Abs: 0.1 10*3/uL — ABNORMAL LOW (ref 0.7–4.0)
MCH: 35.6 pg — ABNORMAL HIGH (ref 26.0–34.0)
MCHC: 32.8 g/dL (ref 30.0–36.0)
MCV: 108.6 fL — ABNORMAL HIGH (ref 80.0–100.0)
Monocytes Absolute: 0.4 10*3/uL (ref 0.1–1.0)
Monocytes Relative: 12 %
Neutro Abs: 2.8 10*3/uL (ref 1.7–7.7)
Neutrophils Relative %: 84 %
Platelets: 110 10*3/uL — ABNORMAL LOW (ref 150–400)
RBC: 3.03 MIL/uL — ABNORMAL LOW (ref 3.87–5.11)
RDW: 21.4 % — ABNORMAL HIGH (ref 11.5–15.5)
WBC: 3.4 10*3/uL — ABNORMAL LOW (ref 4.0–10.5)
nRBC: 0 % (ref 0.0–0.2)

## 2019-09-30 LAB — RAPID URINE DRUG SCREEN, HOSP PERFORMED
Amphetamines: NOT DETECTED
Barbiturates: NOT DETECTED
Benzodiazepines: NOT DETECTED
Cocaine: NOT DETECTED
Opiates: NOT DETECTED
Tetrahydrocannabinol: NOT DETECTED

## 2019-09-30 LAB — PROTIME-INR
INR: 1 (ref 0.8–1.2)
Prothrombin Time: 12.6 seconds (ref 11.4–15.2)

## 2019-09-30 LAB — ETHANOL: Alcohol, Ethyl (B): <10 mg/dL (ref <10)

## 2019-09-30 LAB — APTT: aPTT: 24 seconds (ref 24–36)

## 2019-09-30 MED ORDER — IOHEXOL 350 MG/ML SOLN
100.0000 mL | Freq: Once | INTRAVENOUS | Status: AC | PRN
  Administered 2019-09-30: 100 mL via INTRAVENOUS

## 2019-09-30 MED ORDER — DEXAMETHASONE 4 MG PO TABS
40.0000 mg | ORAL_TABLET | Freq: Once | ORAL | Status: AC
  Administered 2019-09-30: 40 mg via ORAL
  Filled 2019-09-30: qty 10

## 2019-09-30 MED ORDER — GADOBUTROL 1 MMOL/ML IV SOLN
5.0000 mL | Freq: Once | INTRAVENOUS | Status: AC | PRN
  Administered 2019-09-30: 5 mL via INTRAVENOUS

## 2019-09-30 NOTE — ED Notes (Signed)
Patient transported to MRI 

## 2019-09-30 NOTE — ED Provider Notes (Signed)
Emergency Department Provider Note   I have reviewed the triage vital signs and the nursing notes.   HISTORY  Chief Complaint Diplopia   HPI Melissa [name] is a 64 y.o. female with PMH of MM s/p stem cell transplant on chemotherapy presents to the emergency department with double vision.  Symptoms began yesterday afternoon.  She has had a brief episode of double vision in the recent past which lasted for approximately 5 minutes and then resolved.  Yesterday afternoon she noticed that the double vision came back and has been constant and slightly worsening into this morning.  She denies eye pain, headache, speech disturbance, weakness/numbness.  No head injuries.  She is on antibiotics for a perianal abscess and symptoms they are resolving.  She has no prior history of stroke. Symptoms seem worse with looking to the left.    Past Medical History:  Diagnosis Date  . History of stem cell transplant ([place])    for multiple myeloma  . Hypothyroidism   . Multiple myeloma   . Perianal abscess   . Peripheral neuropathy   . Seasonal allergies   . Thyroid disease     Patient Active Problem List   Diagnosis Date Noted  . S/P spinal fusion 06/27/2019  . Multiple myeloma in relapse ([place]) 03/30/2019  . Hyperlipidemia 04/12/2017  . Family history of hemochromatosis 04/12/2017  . 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 History of autologous stem cell transplant ([place]) 08/30/2015  . Hypothyroidism 07/14/2012  . Multiple myeloma ([place]) 11/02/2011    Past Surgical History:  Procedure Laterality Date  . CATARACT EXTRACTION W/PHACO Right 04/04/2013   Procedure: CATARACT EXTRACTION PHACO AND INTRAOCULAR LENS PLACEMENT (IOC);  Surgeon: Elta Guadeloupe T. Gershon Crane, MD;  Location: [facility];  Service: Ophthalmology;  Laterality: Right;  CDE:3.42  . CATARACT EXTRACTION W/PHACO Left 04/18/2013   Procedure: CATARACT EXTRACTION PHACO AND INTRAOCULAR LENS PLACEMENT (IOC);  Surgeon: Elta Guadeloupe T. Gershon Crane, MD;  Location:  [facility];  Service: Ophthalmology;  Laterality: Left;  CDE:4.91  . CESAREAN SECTION  06/1993  . FASCIECTOMY Left 03/02/2017   Procedure: FASCIECTOMY, LEFT SMALL FINGER;  Surgeon: Daryll Brod, MD;  Location: [facility];  Service: Orthopedics;  Laterality: Left;  . HAND SURGERY Left   . TONSILECTOMY, ADENOIDECTOMY, BILATERAL MYRINGOTOMY AND TUBES    . TONSILLECTOMY    . TUBAL LIGATION    . YAG LASER APPLICATION Right 10/11/8370   Procedure: YAG LASER APPLICATION;  Surgeon: Rutherford Guys, MD;  Location: [facility];  Service: Ophthalmology;  Laterality: Right;  . YAG LASER APPLICATION Left 12/27/1113   Procedure: YAG LASER APPLICATION;  Surgeon: Rutherford Guys, MD;  Location: [facility];  Service: Ophthalmology;  Laterality: Left;    Allergies Hydromorphone hcl, Poison ivy extract [poison ivy extract], and Tape  Family History  Problem Relation Age of Onset  . Hypertension Mother   . Heart attack Mother   . Diabetes Mother   . Skin cancer Mother   . Hypertension Father   . Heart attack Father   . Skin cancer Father   . Heart disease Maternal Grandmother   . Stroke Maternal Grandfather   . Hypertension Sister   . Skin cancer Sister   . Hypertension Brother   . Skin cancer Brother   . Hypertension Brother   . Cancer Brother        melanoma  . Skin cancer Brother   . Factor V Leiden deficiency Daughter     Social History Social History  Tobacco Use  . Smoking status: Never Smoker  . Smokeless tobacco: Never Used  Substance Use Topics  . Alcohol use: Yes    Comment: occ  . Drug use: No    Review of Systems  Constitutional: No fever/chills Eyes: Positive diplopia.  ENT: No sore throat. Cardiovascular: Denies chest pain. Respiratory: Denies shortness of breath. Gastrointestinal: No abdominal pain.  No nausea, no vomiting.  No diarrhea.  No constipation. Genitourinary: Negative for dysuria. Musculoskeletal: Negative for back pain. Skin: Negative for  rash. Neurological: Negative for headaches, focal weakness or numbness.  10-point ROS otherwise negative.  ____________________________________________   PHYSICAL EXAM:  VITAL SIGNS: ED Triage Vitals  Enc Vitals Group     BP 09/30/19 1014 (!) 170/80     Pulse Rate 09/30/19 1014 (!) 122     Resp 09/30/19 1014 20     Temp 09/30/19 1014 98.3 F (36.8 C)     Temp Source 09/30/19 1014 Oral     SpO2 09/30/19 1014 97 %     Weight 09/30/19 1010 114 lb (51.7 kg)     Height 09/30/19 1010 5' 1"  (1.549 m)   Constitutional: Alert and oriented. Well appearing and in no acute distress. Eyes: Conjunctivae are normal. PERRL. EOMI. Head: Atraumatic. Nose: No congestion/rhinnorhea. Mouth/Throat: Mucous membranes are moist.  Neck: No stridor.  Cardiovascular: Normal rate, regular rhythm. Good peripheral circulation. Grossly normal heart sounds.   Respiratory: Normal respiratory effort.  No retractions. Lungs CTAB. Gastrointestinal: Soft and nontender. No distention.  Musculoskeletal: No lower extremity tenderness nor edema. No gross deformities of extremities. Neurologic:  Normal speech and language. No gross focal neurologic deficits are appreciated.  No facial asymmetry.  Equal grip strength with no pronator drift.  Normal sensation in the bilateral upper and lower extremities. Skin:  Skin is warm, dry and intact. No rash noted.  ____________________________________________   LABS (all labs ordered are listed, but only abnormal results are displayed)  Labs Reviewed  COMPREHENSIVE METABOLIC PANEL - Abnormal; Notable for the following components:      Result Value   Glucose, Bld 136 (*)    Total Protein 6.3 (*)    All other components within normal limits  CBC WITH DIFFERENTIAL/PLATELET - Abnormal; Notable for the following components:   WBC 3.4 (*)    RBC 3.03 (*)    Hemoglobin 10.8 (*)    HCT 32.9 (*)    MCV 108.6 (*)    MCH 35.6 (*)    RDW 21.4 (*)    Platelets 110 (*)     Lymphs Abs 0.1 (*)    All other components within normal limits  URINALYSIS, ROUTINE W REFLEX MICROSCOPIC - Abnormal; Notable for the following components:   Color, Urine STRAW (*)    All other components within normal limits  ETHANOL  PROTIME-INR  APTT  RAPID URINE DRUG SCREEN, HOSP PERFORMED   ____________________________________________  EKG   EKG Interpretation  Date/Time:  Saturday September 30 2019 10:31:32 EDT Ventricular Rate:  113 PR Interval:    QRS Duration: 97 QT Interval:  324 QTC Calculation: 445 R Axis:   51 Text Interpretation: Sinus tachycardia Baseline wander in lead(s) V3 No STEMI Confirmed by Nanda Quinton [phone]) on 09/30/2019 10:40:12 AM       ____________________________________________  RADIOLOGY  CT Angio Head W or Wo Contrast  Result Date: 09/30/2019 CLINICAL DATA:  Double vision 2 days ago. Recurrent including today. EXAM: CT ANGIOGRAPHY HEAD AND NECK TECHNIQUE: Multidetector CT imaging of the head  and neck was performed using the standard protocol during bolus administration of intravenous contrast. Multiplanar CT image reconstructions and MIPs were obtained to evaluate the vascular anatomy. Carotid stenosis measurements (when applicable) are obtained utilizing NASCET criteria, using the distal internal carotid diameter as the denominator. CONTRAST:  145m OMNIPAQUE IOHEXOL 350 MG/ML SOLN COMPARISON:  PET scan 06/20/2019.  Cervical MRI 03/21/2019. FINDINGS: CT HEAD FINDINGS Brain: No abnormality is seen affecting the brainstem or cerebellum. There appears to be an extra-axial mass along the right lateral parietal convexity with a thickness of 9 mm and diameter of 3 cm. Minimal mass effect upon the surface of the brain. Patient has a history dural based tumor related to myeloma. Therefore, this is favored to represent myeloma rather than meningioma. No shift. No brain edema. No brain parenchymal pathologic finding otherwise. No evidence of old or recent infarction.  No hydrocephalus. No extra-axial fluid collection. Vascular: There is atherosclerotic calcification of the major vessels at the base of the brain. Skull: Negative other than mild irregularity of the inner table on the right associated with the overlying dural mass. Sinuses: Clear Orbits: Normal Review of the MIP images confirms the above findings CTA NECK FINDINGS Aortic arch: Aortic atherosclerosis. Branching pattern is normal. 20% stenosis of the left subclavian artery origin. Right carotid system: Common carotid artery widely patent to the bifurcation. Carotid bifurcation is normal without soft or calcified plaque. Cervical ICA is normal. Left carotid system: Common carotid artery widely patent to the bifurcation. Atherosclerotic plaque at the carotid bifurcation with stenosis of 20%. No ICA bulb stenosis. Vertebral arteries: Large right vertebral artery is widely patent at its origin and through the cervical region to the foramen magnum. 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 Skeleton: Ordinary mid cervical spondylosis. Enhancing dural lesion on the right at C4-5. There was no abnormality in this location on an MRI scan of November 2020. It would seem unlikely that a meningioma could develop that quickly. Therefore, this could possibly relate to the patient's myeloma. The patient did have dural disease in the cervicothoracic junction region on the previous MRI, of which there is no evidence today. Other neck: No soft tissue neck mass or lymphadenopathy. Upper chest: Small effusions layering dependently. Pleural masses on the right, progressive since the previous PET scan February. Review of the MIP images confirms the above findings CTA HEAD FINDINGS Anterior circulation: Both internal carotid arteries are widely patent through the skull base and siphon regions. Ordinary siphon atherosclerotic calcification but without stenosis  greater than 30%. The anterior and middle cerebral vessels are patent without stenosis or occlusion, aneurysm or vascular malformation. Posterior circulation: Both vertebral arteries are patent to the basilar. The right is dominant. No basilar stenosis. Posterior circulation branch vessels are patent. Venous sinuses: Patent and normal. Anatomic variants: None significant. Review of the MIP images confirms the above findings IMPRESSION: Aortic atherosclerosis. Minimal atherosclerotic disease at the left carotid bifurcation with stenosis of 20%. 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 Reconstitution of the left vertebral artery by cervical collaterals in the mid cervical region. Dominant right vertebral artery widely patent. No intracranial occlusion or flow limiting stenosis. Ordinary mild atherosclerotic calcification in the carotid siphon regions. 20% stenosis of the proximal left subclavian artery, probably not flow limiting. 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 1 cm mass in the right spinal  canal and foramen at the C4-5 level likely to represent a dural manifestation of myeloma. Previously seen dural disease on the left at the thoracolumbar junction no longer visible. Increasing pleural masses in the right chest compared with the PET scan of February. Electronically Signed   By: Nelson Chimes M.D.   On: 09/30/2019 12:00   CT Angio Neck W and/or Wo Contrast  Result Date: 09/30/2019 CLINICAL DATA:  Double vision 2 days ago. Recurrent including today. EXAM: CT ANGIOGRAPHY HEAD AND NECK TECHNIQUE: Multidetector CT imaging of the head and neck was performed using the standard protocol during bolus administration of intravenous contrast. Multiplanar CT image reconstructions and MIPs were obtained to evaluate the vascular anatomy. Carotid stenosis measurements (when applicable) are obtained utilizing NASCET  criteria, using the distal internal carotid diameter as the denominator. CONTRAST:  120m OMNIPAQUE IOHEXOL 350 MG/ML SOLN COMPARISON:  PET scan 06/20/2019.  Cervical MRI 03/21/2019. FINDINGS: CT HEAD FINDINGS Brain: No abnormality is seen affecting the brainstem or cerebellum. There appears to be an extra-axial mass along the right lateral parietal convexity with a thickness of 9 mm and diameter of 3 cm. Minimal mass effect upon the surface of the brain. Patient has a history dural based tumor related to myeloma. Therefore, this is favored to represent myeloma rather than meningioma. No shift. No brain edema. No brain parenchymal pathologic finding otherwise. No evidence of old or recent infarction. No hydrocephalus. No extra-axial fluid collection. Vascular: There is atherosclerotic calcification of the major vessels at the base of the brain. Skull: Negative other than mild irregularity of the inner table on the right associated with the overlying dural mass. Sinuses: Clear Orbits: Normal Review of the MIP images confirms the above findings CTA NECK FINDINGS Aortic arch: Aortic atherosclerosis. Branching pattern is normal. 20% stenosis of the left subclavian artery origin. Right carotid system: Common carotid artery widely patent to the bifurcation. Carotid bifurcation is normal without soft or calcified plaque. Cervical ICA is normal. Left carotid system: Common carotid artery widely patent to the bifurcation. Atherosclerotic plaque at the carotid bifurcation with stenosis of 20%. No ICA bulb stenosis. Vertebral arteries: Large right vertebral artery is widely patent at its origin and through the cervical region to the foramen magnum. 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 Skeleton: Ordinary mid cervical spondylosis. Enhancing dural lesion on the right at C4-5. There was no abnormality in this location on an MRI scan of November 2020.  It would seem unlikely that a meningioma could develop that quickly. Therefore, this could possibly relate to the patient's myeloma. The patient did have dural disease in the cervicothoracic junction region on the previous MRI, of which there is no evidence today. Other neck: No soft tissue neck mass or lymphadenopathy. Upper chest: Small effusions layering dependently. Pleural masses on the right, progressive since the previous PET scan February. Review of the MIP images confirms the above findings CTA HEAD FINDINGS Anterior circulation: Both internal carotid arteries are widely patent through the skull base and siphon regions. Ordinary siphon atherosclerotic calcification but without stenosis greater than 30%. The anterior and middle cerebral vessels are patent without stenosis or occlusion, aneurysm or vascular malformation. Posterior circulation: Both vertebral arteries are patent to the basilar. The right is dominant. No basilar stenosis. Posterior circulation branch vessels are patent. Venous sinuses: Patent and normal. Anatomic variants: None significant. Review of the MIP images confirms the above findings IMPRESSION: Aortic atherosclerosis. Minimal atherosclerotic  disease at the left carotid bifurcation with stenosis of 20%. 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 Reconstitution of the left vertebral artery by cervical collaterals in the mid cervical region. Dominant right vertebral artery widely patent. No intracranial occlusion or flow limiting stenosis. Ordinary mild atherosclerotic calcification in the carotid siphon regions. 20% stenosis of the proximal left subclavian artery, probably not flow limiting. 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 1 cm mass in the right spinal canal and foramen at the C4-5 level likely to represent a dural manifestation of myeloma. Previously seen dural disease on the left  at the thoracolumbar junction no longer visible. Increasing pleural masses in the right chest compared with the PET scan of February. Electronically Signed   By: Nelson Chimes M.D.   On: 09/30/2019 12:00    ____________________________________________   PROCEDURES  Procedure(s) performed:   Procedures  None  ____________________________________________   INITIAL IMPRESSION / ASSESSMENT AND PLAN / ED COURSE  Pertinent labs & imaging results that were available during my care of the patient were reviewed by me and considered in my medical decision making (see chart for details).   Patient presents to the emergency department with double vision since yesterday afternoon.  She has no focal neuro deficits.  Extraocular movements appear intact. Pupils are reactive. Eyes are not red or painful.  Central process is a consideration and CTA of the head neck will be performed.  Patient is outside the window for CODE STROKE activation.   12:10 PM  CT scan results reviewed and discussed with patient.  Will have teleneurology see the patient to guide further imaging and work-up.   01:13 PM  Spoke with teleneurology after their evaluation.  They are recommending MRI brain with and without contrast.  I have placed this order and have spoken with Dr. Vanita Panda at [facility].  Patient accepted an ED to ED transfer for MRI.   Discussed securing the IV for transport.  The patient states that it is very uncomfortable for her currently and is insisting that it be removed.  I discussed that it will have to be restarted when she arrives to [facility] and she verbalizes understanding this.    I reviewed all nursing notes, vitals, pertinent old records, EKGs, labs, imaging (as available).  ____________________________________________  FINAL CLINICAL IMPRESSION(S) / ED DIAGNOSES  Final diagnoses:  Diplopia    MEDICATIONS GIVEN DURING THIS VISIT:  Medications  iohexol (OMNIPAQUE) 350 MG/ML injection  100 mL (100 mLs Intravenous Contrast Given 09/30/19 1120)    Note:  This document was prepared using Dragon voice recognition software and may include unintentional dictation errors.  Nanda Quinton, MD, [facility]    Corbin Falck, [facility], MD 09/30/19 [phone]

## 2019-09-30 NOTE — ED Triage Notes (Addendum)
Pt reports she had double vision for a few min on Thursday then it went away.  Says Friday she had some but not as severe as today.  Pt says is able to see normally with one eye covered but sees double when using both eyes.  Also says vision is better close up.  Pt says R eye feels "different."    Pt says is currently on augmentin for perianal abscess.

## 2019-09-30 NOTE — Discharge Instructions (Addendum)
Please go to [facility] for admission.

## 2019-09-30 NOTE — ED Provider Notes (Signed)
3:40 PM BP (!) 152/81 (BP Location: Left Arm)   Pulse (!) 108   Temp 98.3 F (36.8 C) (Oral)   Resp 20   Ht 5' 1" (1.549 m)   Wt 51.7 kg   SpO2 99%   BMI 21.54 kg/m   Patient here in transfer from [place] emergency department for MRI of the brain due to diplopia.  She is a 64-year-old female with a past medical history of multiple myeloma status post stem cell transplant on chemotherapy.  According to the previous providers note she has had intermittent episodes of double vision lasting approximately 5 minutes at a time however she has had constant diplopia since yesterday.   6:08 PM BP 128/88 (BP Location: Right Arm)   Pulse (!) 103   Temp 98.4 F (36.9 C) (Oral)   Resp 13   Ht 5' 1" (1.549 m)   Wt 51.7 kg   SpO2 96%   BMI 21.54 kg/m  MRI reviewed and shows MM lesion in the R parietoccipital region likely the cause of her sxs. Case discussed with Dr. Lambird who would prefer that the patient be transferred for Obs admission and would like for her to get 40 mg Decadron PO. Patient aware of need for admission    Harris, Abigail, PA-C 09/30/19 1845    Sheldon, Charles B, MD 10/01/19 1151  

## 2019-09-30 NOTE — Consult Note (Addendum)
[facility] Tele[facility]urology Consult Services  Stat Consult  Date of Service:   09/30/2019 12:14:48  Impression:     .  H53.2 - Diplopia  Comments/Sign-Out: 64 year old female who presented with diplopia especially with left lateral gaze. Given history of multiple myeloma brainstem mets should be ruled out.  CT HEAD: Showed No Acute Hemorrhage or Acute Core Infarct  Metrics: [facility] Notification Time: 09/30/2019 12:14:21 Stamp Time: 09/30/2019 12:14:48 Callback Response Time: 09/30/2019 12:16:43  Our recommendations are outlined below.  Imaging Studies:     .  MRI Head with and Without Contrast  Disposition: Neurology Follow Up Recommended  Sign Out:     .  Discussed with Emergency Department Provider  ----------------------------------------------------------------------------------------------------  Chief Complaint: Double vision  History of Present Illness: Patient is a 64 year old Female.  63 year old female with a history of multiple myeloma who presents to the hosptial because of 2 days of double vision. Patient is currently on Chemotherapy. Patient denies any other focal weakness, numbness, or difficulty speaking. She did have some trigeminal neuralgia type pain a few months ago but that only lasted a few days.           Examination: BP(170/80), Pulse(122), Blood Glucose(136) 1A: Level of Consciousness - Alert; keenly responsive + 0 1B: Ask Month and Age - Both Questions Right + 0 1C: Blink Eyes & Squeeze Hands - Performs Both Tasks + 0 2: Test Horizontal Extraocular Movements - Normal + 0 -- left lateral gaze palsy in left eye 3: Test Visual Fields - No Visual Loss + 0 4: Test Facial Palsy (Use Grimace if Obtunded) - Normal symmetry + 0 5A: Test Left Arm Motor Drift - No Drift for 10 Seconds + 0 5B: Test Right Arm Motor Drift - No Drift for 10 Seconds + 0 6A: Test Left Leg Motor Drift - No Drift for 5 Seconds + 0 6B: Test  Right Leg Motor Drift - No Drift for 5 Seconds + 0 7: Test Limb Ataxia (FNF/Heel-Shin) - No Ataxia + 0 8: Test Sensation - Normal; No sensory loss + 0 9: Test Language/Aphasia - Normal; No aphasia + 0 10: Test Dysarthria - Normal + 0 11: Test Extinction/Inattention - No abnormality + 0  NIHSS Score: 0    Patient is being evaluated for possible acute neurologic impairment and high probability of imminent or life-threatening deterioration. I spent total of 30 minutes providing care to this patient, including time for face to face visit via telemedicine, review of medical records, imaging studies and discussion of findings with providers, the patient and/or family.   Dr Tsosie Billing   [facility] [phone]  Case [id number]

## 2019-10-01 MED ORDER — OXYCODONE HCL 5 MG PO TABS
10.00 | ORAL_TABLET | ORAL | Status: DC
Start: ? — End: 2019-10-01

## 2019-10-01 MED ORDER — TRAMADOL HCL 50 MG PO TABS
50.00 | ORAL_TABLET | ORAL | Status: DC
Start: ? — End: 2019-10-01

## 2019-10-01 MED ORDER — ACYCLOVIR 400 MG PO TABS
400.00 | ORAL_TABLET | ORAL | Status: DC
Start: 2019-10-03 — End: 2019-10-01

## 2019-10-01 MED ORDER — DEXAMETHASONE SODIUM PHOSPHATE 4 MG/ML IJ SOLN
4.00 | INTRAMUSCULAR | Status: DC
Start: 2019-10-01 — End: 2019-10-01

## 2019-10-01 MED ORDER — PANTOPRAZOLE SODIUM 40 MG PO TBEC
40.00 | DELAYED_RELEASE_TABLET | ORAL | Status: DC
Start: 2019-10-02 — End: 2019-10-01

## 2019-10-01 MED ORDER — ONDANSETRON HCL 8 MG PO TABS
8.00 | ORAL_TABLET | ORAL | Status: DC
Start: ? — End: 2019-10-01

## 2019-10-01 MED ORDER — AMOXICILLIN-POT CLAVULANATE 875-125 MG PO TABS
875.00 | ORAL_TABLET | ORAL | Status: DC
Start: 2019-10-03 — End: 2019-10-01

## 2019-10-01 MED ORDER — LEVOTHYROXINE SODIUM 75 MCG PO TABS
75.00 | ORAL_TABLET | ORAL | Status: DC
Start: 2019-10-02 — End: 2019-10-01

## 2019-10-03 MED ORDER — LACTATED RINGERS IV SOLN
INTRAVENOUS | Status: DC
Start: ? — End: 2019-10-03

## 2019-10-03 MED ORDER — THROMBIN 10000 UNITS EX KIT
PACK | CUTANEOUS | Status: DC
Start: ? — End: 2019-10-03

## 2019-10-03 MED ORDER — BUPIVACAINE-EPINEPHRINE (PF) 0.25% -1:200000 IJ SOLN
INTRAMUSCULAR | Status: DC
Start: ? — End: 2019-10-03

## 2019-10-03 MED ORDER — VANCOMYCIN HCL 1000 MG IV SOLR
INTRAVENOUS | Status: DC
Start: ? — End: 2019-10-03

## 2019-10-03 MED ORDER — SODIUM CHLORIDE 0.9 % IV SOLN
INTRAVENOUS | Status: DC
Start: ? — End: 2019-10-03

## 2019-10-03 MED ORDER — FENTANYL CITRATE (PF) 50 MCG/ML IJ SOLN
50.00 | INTRAMUSCULAR | Status: DC
Start: ? — End: 2019-10-03

## 2019-10-03 MED ORDER — SODIUM CHLORIDE 0.9 % IR SOLN
Status: DC
Start: ? — End: 2019-10-03

## 2019-10-03 MED ORDER — MORPHINE SULFATE 4 MG/ML IJ SOLN
2.00 | INTRAMUSCULAR | Status: DC
Start: ? — End: 2019-10-03

## 2019-10-03 MED ORDER — PROMETHAZINE HCL 25 MG/ML IJ SOLN
6.25 | INTRAMUSCULAR | Status: DC
Start: ? — End: 2019-10-03

## 2019-10-04 ENCOUNTER — Other Ambulatory Visit: Payer: Self-pay | Admitting: Radiation Therapy

## 2019-10-06 ENCOUNTER — Encounter: Payer: Self-pay | Admitting: *Deleted

## 2019-10-06 ENCOUNTER — Other Ambulatory Visit: Payer: Self-pay | Admitting: *Deleted

## 2019-10-06 NOTE — Patient Outreach (Signed)
[facility]) Care Management  10/06/2019  Camreigh Michie Frogge Jan 22, 1956 [id number]   Transition of care call/case closure   Referral received: 10/03/19 Initial outreach: 10/06/19 Insurance: [place] [employer]    Subjective: Initial successful telephone call to patient's preferred number in order to complete transition of care assessment; 2 HIPAA identifiers verified. Explained purpose of call and completed transition of care assessment.  Winter states that she doing amazingly okay. She discussed her recent symptoms of double vision transferred to [facility] after visits at [facility] and [facility] ED. She denies pain and reports it has been controlled. She reports incisional area with sutures in place and site unremarkable. She reports still having double vision problems. She reports tolerating diet, denies bowel or bladder problems.  Spouse is  assisting with  recovery.   She does not have the hospital indemnity, discussed previous matrix leave was over and she had returned to work 3 weeks until this new event, states it will be  ADA now. She states she  uses a [employer], Zacarias Pontes.  Objective:  Mrs. Harbor was hospitalized at [facility]   for Diplopia, Craniotomy resection of brain mass. Patient history includes but not limited to : Multiple Myeloma, chemotherapy,  Stem cell transplant 2010, hypothyroidism, T4-T8 spinal fusion 05/04/19 She  was discharged to home on 10/05/19 without the need for home health services or DME.   Assessment:  Patient voices good understanding of all discharge instructions.  See transition of care flowsheet for assessment details.   Plan:  Reviewed hospital discharge diagnosis of Craniotomy for dural based mass resection   and discharge treatment plan using hospital discharge instructions, assessing medication adherence, reviewing problems requiring provider notification, and discussing the importance of follow up with  surgeon,  and/or specialists as directed.   No ongoing care management needs identified so will close case to [place] Management services and route successful outreach letter with [facility] pamphlet and 24 Hour Nurse Line Magnet to [facility] clinical pool to be mailed to patient's home address.   Joylene Draft, RN, BSN  [place] Management Coordinator  [phone]- Mobile [phone]- Toll Free Main Office

## 2019-10-09 ENCOUNTER — Encounter: Payer: Self-pay | Admitting: Adult Health

## 2019-10-09 ENCOUNTER — Telehealth: Payer: Self-pay | Admitting: Radiation Oncology

## 2019-10-09 ENCOUNTER — Ambulatory Visit (INDEPENDENT_AMBULATORY_CARE_PROVIDER_SITE_OTHER): Payer: 59 | Admitting: Adult Health

## 2019-10-09 VITALS — BP 99/63 | HR 102 | Ht 62.0 in | Wt 114.0 lb

## 2019-10-09 DIAGNOSIS — Z1212 Encounter for screening for malignant neoplasm of rectum: Secondary | ICD-10-CM | POA: Diagnosis not present

## 2019-10-09 DIAGNOSIS — K6289 Other specified diseases of anus and rectum: Secondary | ICD-10-CM | POA: Diagnosis not present

## 2019-10-09 DIAGNOSIS — Z1211 Encounter for screening for malignant neoplasm of colon: Secondary | ICD-10-CM

## 2019-10-09 LAB — HEMOCCULT GUIAC POC 1CARD (OFFICE): Fecal Occult Blood, POC: NEGATIVE

## 2019-10-09 MED ORDER — HYDROCORT-PRAMOXINE (PERIANAL) 2.5-1 % EX CREA: 1.0000 "application " | TOPICAL_CREAM | Freq: Three times a day (TID) | CUTANEOUS | 1 refills | Status: DC

## 2019-10-09 NOTE — Progress Notes (Signed)
  Subjective:     Patient ID: Melissa [name], female   DOB: Sep 12, 1955, 64 y.o.   MRN: [id number]  HPI Melissa [name] is a 64 year old white female,married, PM, in for recheck on ?peri recctal  abscess was seen at Urgent care and treated with Augmentin. She has has myeloma and has had relapse, and recent brain surgery to remove mass, has double vision at times. PCP is [facility].   Review of Systems For 2 weeks now has pain in rectal area esp to the left, was treated with Augmentin at Urgent care for ?perirectal abscess  No problems with BMs Reviewed past medical,surgical, social and family history. Reviewed medications and allergies.     Objective:   Physical Exam BP 99/63 (BP Location: Right Arm, Patient Position: Sitting, Cuff Size: Normal)   Pulse (!) 102   Ht 5\' 2"  (1.575 m)   Wt 114 lb (51.7 kg)   BMI 20.85 kg/m  Skin warm and dry.  Lungs: clear to ausculation bilaterally. Cardiovascular: regular rate and rhythm. Pelvic: external genitalia is normal in appearance no lesions, vagina: pale and atrophic,urethra has no lesions or masses noted, cervix:smooth, uterus: normal size, shape and contour, non tender, no masses felt, adnexa: no masses or tenderness noted. Bladder is non tender and no masses felt. No peri rectal abscess seen. On rectal exam has good tone, no masses felt, has thickness and tenderness left rectal sphincter, hemoccult negative.   Examination chaperoned by Dwyane Dee LPN Assessment:     1. Rectal pain Will try anal pram HC and recheck in 2 weeks if still painful or still fells swollen refer to GI Meds ordered this encounter  Medications  . hydrocortisone-pramoxine (ANALPRAM-HC) 2.5-1 % rectal cream    Sig: Place 1 application rectally 3 (three) times daily.    Dispense:  30 g    Refill:  1    Order Specific Question:   Supervising Provider    Answer:   Elonda Husky, LUTHER H [[id number]]    2. Screening for colorectal cancer     Plan:     Will recheck in 2 weeks if not  better then refer to GI

## 2019-10-09 NOTE — Progress Notes (Signed)
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 There is mild mass effect upon the underlying right parietal lobe without vasogenic edema.  Patient presented with symptoms of:  Diplopia worsened. Patient presented to ED at [facility]. Stroke work up done at [facility]. MRI at [facility] reveal mass. Patient transferred to [facility] where she receives her cancer care primarily.  Past or anticipated interventions, if any, per neurosurgery: denies  Past or anticipated interventions, if any, per medical oncology: Velcade, Revlimid maintenance, Zometa, Pomalyst   Dose of Decadron, if applicable: denies  Recent neurologic symptoms, if any:   Seizures: denies  Headaches: denies  Nausea: denies  Dizziness/ataxia: denies  Difficulty with hand coordination: difficulty related to diplopia  Focal numbness/weakness: denies  Visual deficits/changes: Reports diplopia. Floaters related to cataract              surgery but nothing new. Peripheral vision intact. 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 yes  Pacemaker/ICD? denies  Possible current pregnancy? no  Is the patient on methotrexate? denies  Additional Complaints / other details: 64 year old female.

## 2019-10-09 NOTE — Telephone Encounter (Signed)
Contacted patient to verify mychart video visit for pre reg 

## 2019-10-10 ENCOUNTER — Other Ambulatory Visit: Payer: Self-pay

## 2019-10-10 ENCOUNTER — Ambulatory Visit
Admission: RE | Admit: 2019-10-10 | Discharge: 2019-10-10 | Disposition: A | Discharge status: Home / Self Care | Payer: 59 | Source: Ambulatory Visit | Attending: Radiation Oncology | Admitting: Radiation Oncology

## 2019-10-10 ENCOUNTER — Encounter: Payer: Self-pay | Admitting: Radiation Oncology

## 2019-10-10 VITALS — Ht 62.0 in | Wt 111.0 lb

## 2019-10-10 DIAGNOSIS — Z85830 Personal history of malignant neoplasm of bone: Secondary | ICD-10-CM | POA: Diagnosis not present

## 2019-10-10 DIAGNOSIS — C7931 Secondary malignant neoplasm of brain: Secondary | ICD-10-CM | POA: Diagnosis not present

## 2019-10-10 DIAGNOSIS — C9000 Multiple myeloma not having achieved remission: Secondary | ICD-10-CM

## 2019-10-10 DIAGNOSIS — C9002 Multiple myeloma in relapse: Secondary | ICD-10-CM

## 2019-10-10 DIAGNOSIS — Z923 Personal history of irradiation: Secondary | ICD-10-CM | POA: Diagnosis not present

## 2019-10-10 DIAGNOSIS — C9030 Solitary plasmacytoma not having achieved remission: Secondary | ICD-10-CM

## 2019-10-10 NOTE — Progress Notes (Signed)
Radiation Oncology         [phone] ________________________________  Name: Melissa [name] MRN: [id number]  Date: 10/10/2019  DOB: May 21, 1955  Re-Consultation Note- Conducted via Ranger video enable visit due to current COVID-19 concerns for limiting patient exposure  CC: Melissa Drown, MD  Melissa Drown, MD  Diagnosis:  64 y.o. 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 Left scapula and C7 - T2 / 20 Gy in 10 fractions 2. 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 She is well known to our service, having received multiple courses of palliative radiotherapy to the thoracic and lumbar spine, left shoulder and right hip.  In summary, she was initially diagnosed with multiple myeloma in 09/2008 after presenting with right-sided upper back pain. She had been followed by Melissa [name] with the [facility] and by Melissa [name] with the [place]. Please see previous consultation notes for complete HPI. She was last seen in our clinic for follow up on 07/05/2019, following palliative XRT to T4 - T8 with significant improvement in her back pain at that time. She has transferred her medical oncology care to Melissa [name] at [facility] and is currently being treated with salvage systemic therapy with carfilzomib, Daratumumab and dexamethasone- started 07/04/19. Recent serologies have shown improvement indicating interval response to treatment.  More recently, she presented to  the ED at [facility] on 09/30/2019 with acute onset diplopia. She reported intermittent occurrences previously but began experiencing constant double vision. She underwent CT angio head/neck on admission showing a 3 cm mass in the right lateral parietal convexity as well as a 1 cm mass in the right spinal canal and foramen at C4-5, and increasing pleural masses in the right chest as compared with 05/2019 PET scan. She proceeded to brain MRI for  further evaluation of the lesions which confirmed a 4.4 cm dural-based enhancing mass overlying the right parietal convexity, with mild mass effect upon the underlying right parietal lobe as well as T1 hypointense signal abnormality within the basiocciput, clivus, and visualized cervical spine.  She was started on IV dexamethasone and transferred to [facility] where her neurosurgeon was consulted for brain biopsy. She underwent open biopsy with craniotomy on 10/03/2019 under the care of Melissa [name] with full resection of the epidural based lesion that was actually attached to the skull. Final surgical pathology confirmed plasma cell myeloma. A postsurgical CT angio head/neck performed on 10/04/2019 showed a new low density adjacent to craniotomy site, favored to reflect vasogenic edema. She was discharged home on 10/05/19 on a steroid taper which she is tolerating well.  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 Radiation oncology was consulted at [facility] and felt the patient was appropriate for a short course of radiotherapy targeting the clivus lesion but patient preferred to have  the radiation treatment here in [place].  Therefore, she has kindly been referred back to [place] today for further discussion regarding palliative radiotherapy to the clivus lesion in an attempt to improve or at least preserve her visual  function/acuity. Her systemic therapy has been on hold since the time of her procedure and she will meet with Melissa [name] on 10/16/19 to discuss resuming therapy.  Of note, while admitted, she underwent total spine MRI on 10/01/2019 showing no evidence of overt leptomeningeal involvement but did note an interval development of a T2 hypointense 8 mm perineural soft tissue lesion within the right neural foramen of C4-5, which displaces nerve roots at this level and mildly effaces ventral CSF, new relative to prior exam from 05/03/19. However, a PET scan from 06/20/19 demonstrated considerable decrease in abnormal uptake within the cervical and thoracic spine as well as the lumbar spine as well as improved uptake within the right iliac bone, proximal right femur and proximal left humerus. She currently denies any focal pain in the cervical thoracic or lumbar spine and denies weakness or paraesthesias in the upper or lower extremities or changes in bowel/bladder function.   ALLERGIES:  is allergic to hydromorphone hcl, poison ivy extract [poison ivy extract], and tape.  Meds: Current Outpatient Medications  Medication Sig Dispense Refill  . acetaminophen (TYLENOL) 500 MG tablet Take 1 to 2   tablet(s) by mouth only as needed for pain *OCCASIONAL use only    . acyclovir (ZOVIRAX) 400 MG tablet Take 1 tablet (400 mg total) by mouth 2 (two) times daily. 60 tablet 3  . Calcium Carbonate-Vitamin D (CALCIUM + D PO) Take 1 tablet by mouth daily.    . Cholecalciferol (VITAMIN D) 2000 UNITS tablet Take 2,000 Units by mouth daily.    . hydrocortisone-pramoxine (ANALPRAM-HC) 2.5-1 % rectal cream Place 1 application rectally 3 (three) times daily. 30 g 1  . levothyroxine (SYNTHROID) 75 MCG tablet Take 1 tablet (75 mcg total) by mouth daily. 90 tablet 1  . omeprazole (PRILOSEC) 20 MG capsule Take 20 mg by mouth daily.    Marland Kitchen oxyCODONE-acetaminophen (PERCOCET) 10-325 MG tablet Take 1 tablet by mouth every 8 (eight)  hours as needed for pain. 30 tablet 0  . dexamethasone (DECADRON) 4 MG tablet Take 10 tablets (40 mg total) by mouth once a week. (Patient not taking: Reported on 10/10/2019) 40 tablet 2  . prochlorperazine (COMPAZINE) 10 MG tablet Take 1 tablet (10 mg total) by mouth every 6 (six) hours as needed for nausea or vomiting. (Patient not taking: Reported on 10/10/2019) 45 tablet 1  . senna-docusate (SENOKOT-S) 8.6-50 MG tablet Take by mouth. (Patient not taking: Reported on 10/10/2019)    . traMADol (ULTRAM) 50 MG tablet Take 50 mg by mouth every 8 (eight) hours as needed. (Patient not taking: Reported on 10/10/2019)     No current facility-administered medications for this encounter.   Facility-Administered Medications Ordered in Other Encounters  Medication Dose Route Frequency Provider Last Rate Last Admin  . 0.9 %  sodium chloride infusion   Intravenous Continuous Baird Cancer, PA-C 20 mL/hr at 12/05/10 0947 New Bag at 12/05/10 0947  . 0.9 %  sodium chloride infusion   Intravenous Continuous Everardo All, MD 20 mL/hr at 02/26/11 1528 New Bag at 02/26/11 1528  . sodium chloride 0.9 % injection 10 mL  10 mL Intravenous PRN Everardo All, MD   10 mL at 02/26/11 1518    Physical Findings:  height is '5\' 2"'$  (  1.575 m) and weight is 111 lb (50.3 kg).  Pain Assessment Pain Score: 0-No pain/  In general this is a well appearing Caucasian woman in no acute distress. She's alert and oriented x4 and appropriate throughout the examination. Cardiopulmonary assessment is negative for acute distress and she exhibits normal effort.   Lab Findings: Lab Results  Component Value Date   WBC 3.4 (L) 09/30/2019   HGB 10.8 (L) 09/30/2019   HCT 32.9 (L) 09/30/2019   MCV 108.6 (H) 09/30/2019   PLT 110 (L) 09/30/2019     Radiographic Findings: CT Angio Head W or Wo Contrast  Result Date: 09/30/2019 CLINICAL DATA:  Double vision 2 days ago. Recurrent including today. EXAM: CT ANGIOGRAPHY HEAD AND NECK  TECHNIQUE: Multidetector CT imaging of the head and neck was performed using the standard protocol during bolus administration of intravenous contrast. Multiplanar CT image reconstructions and MIPs were obtained to evaluate the vascular anatomy. Carotid stenosis measurements (when applicable) are obtained utilizing NASCET criteria, using the distal internal carotid diameter as the denominator. CONTRAST:  133m OMNIPAQUE IOHEXOL 350 MG/ML SOLN COMPARISON:  PET scan 06/20/2019.  Cervical MRI 03/21/2019. FINDINGS: CT HEAD FINDINGS Brain: No abnormality is seen affecting the brainstem or cerebellum. There appears to be an extra-axial mass along the right lateral parietal convexity with a thickness of 9 mm and diameter of 3 cm. Minimal mass effect upon the surface of the brain. Patient has a history dural based tumor related to myeloma. Therefore, this is favored to represent myeloma rather than meningioma. No shift. No brain edema. No brain parenchymal pathologic finding otherwise. No evidence of old or recent infarction. No hydrocephalus. No extra-axial fluid collection. Vascular: There is atherosclerotic calcification of the major vessels at the base of the brain. Skull: Negative other than mild irregularity of the inner table on the right associated with the overlying dural mass. Sinuses: Clear Orbits: Normal Review of the MIP images confirms the above findings CTA NECK FINDINGS Aortic arch: Aortic atherosclerosis. Branching pattern is normal. 20% stenosis of the left subclavian artery origin. Right carotid system: Common carotid artery widely patent to the bifurcation. Carotid bifurcation is normal without soft or calcified plaque. Cervical ICA is normal. Left carotid system: Common carotid artery widely patent to the bifurcation. Atherosclerotic plaque at the carotid bifurcation with stenosis of 20%. No ICA bulb stenosis. Vertebral arteries: Large right vertebral artery is widely patent at its origin and through  the cervical region to the foramen magnum. 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 Skeleton: Ordinary mid cervical spondylosis. Enhancing dural lesion on the right at C4-5. There was no abnormality in this location on an MRI scan of November 2020. It would seem unlikely that a meningioma could develop that quickly. Therefore, this could possibly relate to the patient's myeloma. The patient did have dural disease in the cervicothoracic junction region on the previous MRI, of which there is no evidence today. Other neck: No soft tissue neck mass or lymphadenopathy. Upper chest: Small effusions layering dependently. Pleural masses on the right, progressive since the previous PET scan February. Review of the MIP images confirms the above findings CTA HEAD FINDINGS Anterior circulation: Both internal carotid arteries are widely patent through the skull base and siphon regions. Ordinary siphon atherosclerotic calcification but without stenosis greater than 30%. The anterior and middle cerebral vessels are patent without stenosis or occlusion, aneurysm or vascular malformation. Posterior circulation: Both vertebral arteries are patent  to the basilar. The right is dominant. No basilar stenosis. Posterior circulation branch vessels are patent. Venous sinuses: Patent and normal. Anatomic variants: None significant. Review of the MIP images confirms the above findings IMPRESSION: Aortic atherosclerosis. Minimal atherosclerotic disease at the left carotid bifurcation with stenosis of 20%. 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 Reconstitution of the left vertebral artery by cervical collaterals in the mid cervical region. Dominant right vertebral artery widely patent. No intracranial occlusion or flow limiting stenosis. Ordinary mild atherosclerotic calcification in the carotid siphon regions. 20% stenosis of the proximal left subclavian artery,  probably not flow limiting. 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 1 cm mass in the right spinal canal and foramen at the C4-5 level likely to represent a dural manifestation of myeloma. Previously seen dural disease on the left at the thoracolumbar junction no longer visible. Increasing pleural masses in the right chest compared with the PET scan of February. Electronically Signed   By: Nelson Chimes M.D.   On: 09/30/2019 12:00   CT Angio Neck W and/or Wo Contrast  Result Date: 09/30/2019 CLINICAL DATA:  Double vision 2 days ago. Recurrent including today. EXAM: CT ANGIOGRAPHY HEAD AND NECK TECHNIQUE: Multidetector CT imaging of the head and neck was performed using the standard protocol during bolus administration of intravenous contrast. Multiplanar CT image reconstructions and MIPs were obtained to evaluate the vascular anatomy. Carotid stenosis measurements (when applicable) are obtained utilizing NASCET criteria, using the distal internal carotid diameter as the denominator. CONTRAST:  158m OMNIPAQUE IOHEXOL 350 MG/ML SOLN COMPARISON:  PET scan 06/20/2019.  Cervical MRI 03/21/2019. FINDINGS: CT HEAD FINDINGS Brain: No abnormality is seen affecting the brainstem or cerebellum. There appears to be an extra-axial mass along the right lateral parietal convexity with a thickness of 9 mm and diameter of 3 cm. Minimal mass effect upon the surface of the brain. Patient has a history dural based tumor related to myeloma. Therefore, this is favored to represent myeloma rather than meningioma. No shift. No brain edema. No brain parenchymal pathologic finding otherwise. No evidence of old or recent infarction. No hydrocephalus. No extra-axial fluid collection. Vascular: There is atherosclerotic calcification of the major vessels at the base of the brain. Skull: Negative other than mild irregularity of the inner table on the  right associated with the overlying dural mass. Sinuses: Clear Orbits: Normal Review of the MIP images confirms the above findings CTA NECK FINDINGS Aortic arch: Aortic atherosclerosis. Branching pattern is normal. 20% stenosis of the left subclavian artery origin. Right carotid system: Common carotid artery widely patent to the bifurcation. Carotid bifurcation is normal without soft or calcified plaque. Cervical ICA is normal. Left carotid system: Common carotid artery widely patent to the bifurcation. Atherosclerotic plaque at the carotid bifurcation with stenosis of 20%. No ICA bulb stenosis. Vertebral arteries: Large right vertebral artery is widely patent at its origin and through the cervical region to the foramen magnum. 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 Skeleton: Ordinary mid cervical spondylosis. Enhancing dural lesion on the right at C4-5. There was no abnormality in this location on an MRI scan of November 2020. It would seem unlikely that a meningioma could develop that quickly. Therefore, this could possibly relate to the patient's myeloma. The patient did have dural disease in the cervicothoracic junction region on the previous MRI, of  which there is no evidence today. Other neck: No soft tissue neck mass or lymphadenopathy. Upper chest: Small effusions layering dependently. Pleural masses on the right, progressive since the previous PET scan February. Review of the MIP images confirms the above findings CTA HEAD FINDINGS Anterior circulation: Both internal carotid arteries are widely patent through the skull base and siphon regions. Ordinary siphon atherosclerotic calcification but without stenosis greater than 30%. The anterior and middle cerebral vessels are patent without stenosis or occlusion, aneurysm or vascular malformation. Posterior circulation: Both vertebral arteries are patent to the basilar. The right is  dominant. No basilar stenosis. Posterior circulation branch vessels are patent. Venous sinuses: Patent and normal. Anatomic variants: None significant. Review of the MIP images confirms the above findings IMPRESSION: Aortic atherosclerosis. Minimal atherosclerotic disease at the left carotid bifurcation with stenosis of 20%. 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 Reconstitution of the left vertebral artery by cervical collaterals in the mid cervical region. Dominant right vertebral artery widely patent. No intracranial occlusion or flow limiting stenosis. Ordinary mild atherosclerotic calcification in the carotid siphon regions. 20% stenosis of the proximal left subclavian artery, probably not flow limiting. 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 1 cm mass in the right spinal canal and foramen at the C4-5 level likely to represent a dural manifestation of myeloma. Previously seen dural disease on the left at the thoracolumbar junction no longer visible. Increasing pleural masses in the right chest compared with the PET scan of February. Electronically Signed   By: Nelson Chimes M.D.   On: 09/30/2019 12:00   MR Brain W and Wo Contrast  Result Date: 09/30/2019 CLINICAL DATA:  Diplopia; focal neuro deficit, greater than 6 hours, stroke suspected. Additional history obtained from [place] [facility] of multiple myeloma. EXAM: MRI HEAD WITHOUT AND WITH CONTRAST TECHNIQUE: Multiplanar, multiecho pulse sequences of the brain and surrounding structures were obtained without and with intravenous contrast. CONTRAST:  74m GADAVIST GADOBUTROL 1 MMOL/ML IV SOLN COMPARISON:  CT angiogram head/neck performed earlier the same day 09/30/2019 FINDINGS: Brain: There is an enhancing dural-based mass overlying the right parietal convexity which measures 4.4 x 1.1 cm in transaxial dimensions (series 18, image 35). This  finding likely reflects a dural manifestation of multiple myeloma. Mild scattered T2/FLAIR hyperintensity within the cerebral white matter is nonspecific, but consistent with chronic small vessel ischemic disease. Tiny chronic lacunar infarcts within the left cerebellum. There is no acute infarct. No chronic intracranial blood products. No extra-axial fluid collection. No midline shift. Vascular: Expected proximal arterial flow voids. Skull and upper cervical spine: There is T1 hypointense signal abnormality within the basiocciput and clivus as well as visualized upper cervical spine. There is patchy abnormal enhancement within the right greater than left basiocciput as well as subcentimeter foci of enhancement within the frontal calvarium. Findings are consistent with the known history of multiple myeloma. Sinuses/Orbits: Visualized orbits show no acute finding. Mild left ethmoid sinus mucosal thickening. No significant mastoid effusion IMPRESSION: No evidence of acute infarct. 4.4 x 1.1 cm dural-based enhancing mass overlying the right parietal convexity which likely reflects a dural manifestation of known multiple myeloma. There is mild mass effect upon the underlying right parietal lobe without vasogenic edema. T1 hypointense signal abnormality within the basiocciput, clivus and visualized cervical spine. Additionally, there is patchy abnormal enhancement within the basiocciput, as well as a subcentimeter foci of enhancement within the frontal  calvarium, and findings are consistent with sequelae of known multiple myeloma. Mild chronic small vessel ischemic disease. Small chronic left cerebellar lacunar infarcts. Mild ethmoid sinus mucosal thickening. Electronically Signed   By: Kellie Simmering DO   On: 09/30/2019 17:04    Impression/Plan:  This visit was conducted via MyChart video-enabled format to spare the patient unnecessary potential exposure in the healthcare setting during the current COVID-19 pandemic.     30. 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 Today, we talked to the patient about the findings and workup thus far. We discussed the natural history of recurrent multiple myeloma and general treatment, highlighting the role of palliative radiotherapy in the management of symptomatic lesions. We discussed the available radiation techniques, and focused on the details and logistics of delivery. The recommendation is to proceed with a 2 week course of daily radiotherapy to the base of skull lesion involving the clivus and to include the tumor at C4-5.  We reviewed the anticipated acute and late sequelae associated with radiation in this setting. We also discussed the role of adjuvant radiotherapy to the surgical resection site in the right parietal region to further reduce the risk of tumor recurrence. However, this treatment would result in hair loss in the treatment field and is not strongly recommended given the excellent response of multiple myeloma to radiotherapy should the tumor recur.  We will be closely monitoring the dural based disease with serial MRI brain scans going forward.and therefore could effectively treat/manage any recurrence with radiotherapy if indicated on future scans   The patient was encouraged to ask questions that were answered to her stated satisfaction. She appears to have a good understanding of her disease and our recommendations which are of palliative intent and is in agreement with the stated plan.  At the end of our conversation, the patient is interested in moving forward with the recommended 2 week course of radiation therapy to the base of skull lesion and C4-5 lesion. She has provided verbal consent to proceed today and is comfortable signing a formal written consent at the time of her CT SIM and a copy of this document will be placed in her medical record. She is scheduled for CT simulation this Friday, 10/13/2019,  at 10 am in anticipation of beginning her daily treatments in the near future. She is also scheduled to follow up with her medical oncologist, Melissa [name], at [facility] on 10/16/2019 and with Melissa [name] in neurosurgery on 10/17/2019.  Given current concerns for patient exposure during the COVID-19 pandemic, this encounter was conducted via MyChart, video-enabled meeting, to allow for face to face communication. The patient has given verbal consent for this type of encounter. The time spent during this encounter was 30 minutes. The attendants for this meeting include Melissa Pita MD, Melissa Bruning PA-C, [place], and patient, Melissa [name]. During the encounter, Melissa Pita MD, Melissa Bruning PA-C, and scribe, Wilburn Mylar, were located at [facility] Radiation Oncology Department.  Patient, Melissa [name] was located at home.   Nicholos Johns, PA-C    Melissa Pita, MD  [facility] Direct Dial: [phone]  Fax: [phone] [email or web address]  Skype  [email or web address]   This document serves as a record of services personally performed by Melissa Pita, MD and Freeman Caldron, PA-C. It was created on their behalf by Wilburn Mylar, a trained medical scribe. The creation of this record is  based on the scribe's personal observations and the provider's statements to them. This document has been checked and approved by the attending provider.

## 2019-10-11 DIAGNOSIS — C9030 Solitary plasmacytoma not having achieved remission: Secondary | ICD-10-CM | POA: Insufficient documentation

## 2019-10-11 DIAGNOSIS — C9000 Multiple myeloma not having achieved remission: Secondary | ICD-10-CM | POA: Insufficient documentation

## 2019-10-13 ENCOUNTER — Ambulatory Visit
Admission: RE | Admit: 2019-10-13 | Discharge: 2019-10-13 | Disposition: A | Discharge status: Home / Self Care | Payer: 59 | Source: Ambulatory Visit | Attending: Radiation Oncology | Admitting: Radiation Oncology

## 2019-10-13 ENCOUNTER — Other Ambulatory Visit: Payer: Self-pay

## 2019-10-13 DIAGNOSIS — C9000 Multiple myeloma not having achieved remission: Secondary | ICD-10-CM | POA: Diagnosis not present

## 2019-10-13 DIAGNOSIS — Z510 Encounter for antineoplastic radiation therapy: Secondary | ICD-10-CM | POA: Insufficient documentation

## 2019-10-13 DIAGNOSIS — C7931 Secondary malignant neoplasm of brain: Secondary | ICD-10-CM | POA: Diagnosis not present

## 2019-10-13 DIAGNOSIS — C9002 Multiple myeloma in relapse: Secondary | ICD-10-CM

## 2019-10-16 DIAGNOSIS — D61818 Other pancytopenia: Secondary | ICD-10-CM | POA: Diagnosis not present

## 2019-10-16 DIAGNOSIS — Z9889 Other specified postprocedural states: Secondary | ICD-10-CM | POA: Diagnosis not present

## 2019-10-16 DIAGNOSIS — Z5111 Encounter for antineoplastic chemotherapy: Secondary | ICD-10-CM | POA: Diagnosis not present

## 2019-10-16 DIAGNOSIS — G8929 Other chronic pain: Secondary | ICD-10-CM | POA: Diagnosis not present

## 2019-10-16 DIAGNOSIS — Z9484 Stem cells transplant status: Secondary | ICD-10-CM | POA: Diagnosis not present

## 2019-10-16 DIAGNOSIS — C9001 Multiple myeloma in remission: Secondary | ICD-10-CM | POA: Diagnosis not present

## 2019-10-16 DIAGNOSIS — C9000 Multiple myeloma not having achieved remission: Secondary | ICD-10-CM | POA: Diagnosis not present

## 2019-10-16 DIAGNOSIS — H532 Diplopia: Secondary | ICD-10-CM | POA: Diagnosis not present

## 2019-10-16 DIAGNOSIS — R5383 Other fatigue: Secondary | ICD-10-CM | POA: Diagnosis not present

## 2019-10-16 DIAGNOSIS — C9002 Multiple myeloma in relapse: Secondary | ICD-10-CM | POA: Diagnosis not present

## 2019-10-16 DIAGNOSIS — M546 Pain in thoracic spine: Secondary | ICD-10-CM | POA: Diagnosis not present

## 2019-10-16 DIAGNOSIS — R748 Abnormal levels of other serum enzymes: Secondary | ICD-10-CM | POA: Diagnosis not present

## 2019-10-16 NOTE — Progress Notes (Signed)
  Radiation Oncology         [phone] ________________________________  Name: Melissa [name] MRN: [id number]  Date: 10/13/2019  DOB: 13-Jul-1955  SIMULATION AND TREATMENT PLANNING NOTE    ICD-10-CM   1. Multiple myeloma in relapse ([place])  C90.02     DIAGNOSIS:  64 yo woman with clivus involvement of multiple myeloma  NARRATIVE:  The patient was brought to the [facility].  Identity was confirmed.  All relevant records and images related to the planned course of therapy were reviewed.  The patient freely provided informed written consent to proceed with treatment after reviewing the details related to the planned course of therapy. The consent form was witnessed and verified by the simulation staff.  Then, the patient was set-up in a stable reproducible  supine position for radiation therapy.  CT images were obtained.  Surface markings were placed.  The CT images were loaded into the planning software.  Then the target and avoidance structures were contoured.  Treatment planning then occurred.  The radiation prescription was entered and confirmed.  Then, I designed and supervised the construction of a total of 3 medically necessary complex treatment devices, including a custom made thermoplastic mask used for immobilization and two complex multileaf collimators to cover the clivus, while shielding the eyes and face.  Each [facility] is independently created to account for beam divergence.  The right and left lateral fields will be treated with 6 MV X-rays.  I have requested : Isodose Plan.    PLAN:  The whole brain will be treated to 20 Gy in 10 fractions.  ________________________________  Sheral Apley [name], M.D.

## 2019-10-17 MED FILL — DEXAMETHASONE 4 MG TABLET: 4 | 28 days supply | Qty: 10 | Fill #1

## 2019-10-17 MED FILL — PANTOPRAZOLE SOD DR 40 MG T: 40 | 90 days supply | Qty: 90 | Fill #1

## 2019-10-19 MED FILL — OXYCODONE-APAP 10-325: 10-325 | 30 days supply | Qty: 90 | Fill #0

## 2019-10-23 ENCOUNTER — Encounter: Payer: Self-pay | Admitting: Adult Health

## 2019-10-23 ENCOUNTER — Ambulatory Visit (INDEPENDENT_AMBULATORY_CARE_PROVIDER_SITE_OTHER): Payer: 59 | Admitting: Adult Health

## 2019-10-23 ENCOUNTER — Ambulatory Visit: Payer: 59

## 2019-10-23 VITALS — BP 112/64 | HR 111 | Ht 62.0 in | Wt 117.0 lb

## 2019-10-23 DIAGNOSIS — K6289 Other specified diseases of anus and rectum: Secondary | ICD-10-CM | POA: Diagnosis not present

## 2019-10-23 NOTE — Progress Notes (Signed)
°  Subjective:     Patient ID: Melissa [name], female   DOB: 20-Jan-1956, 64 y.o.   MRN: [id number]  HPI Melissa [name] is a 64 year old white female, married,PM back in for recheck for rectal pain and it is better, has been using analpram HC. She starts radiation tomorrow for brain tumor, has been having double vision and is currently not working. PCP is [facility].  Review of Systems Pain in rectal area is better  Reviewed past medical,surgical, social and family history. Reviewed medications and allergies.     Objective:   Physical Exam BP 112/64 (BP Location: Left Arm, Patient Position: Sitting, Cuff Size: Normal)    Pulse (!) 111    Ht 5\' 2"  (1.575 m)    Wt 117 lb (53.1 kg)    BMI 21.40 kg/m    Skin is warm and dry, on rectal exam, has good tone, thickness feels to be resolved on left sphincter, and it is no longer tender Examination chaperoned by Levy Pupa LPN.  Assessment:     Rectal pain, has resolved Continue using analpram 2-3 x weekly and call GI for appt., colonoscopy is past due, and then GI can get better look in rectal area     Plan:     Return in 10 months for pap and physical

## 2019-10-24 ENCOUNTER — Ambulatory Visit
Admission: RE | Admit: 2019-10-24 | Discharge: 2019-10-24 | Disposition: A | Discharge status: Home / Self Care | Payer: 59 | Source: Ambulatory Visit | Attending: Radiation Oncology | Admitting: Radiation Oncology

## 2019-10-24 ENCOUNTER — Other Ambulatory Visit: Payer: Self-pay

## 2019-10-24 DIAGNOSIS — C7931 Secondary malignant neoplasm of brain: Secondary | ICD-10-CM | POA: Diagnosis not present

## 2019-10-24 DIAGNOSIS — Z510 Encounter for antineoplastic radiation therapy: Secondary | ICD-10-CM | POA: Diagnosis not present

## 2019-10-24 DIAGNOSIS — C9000 Multiple myeloma not having achieved remission: Secondary | ICD-10-CM | POA: Diagnosis not present

## 2019-10-25 ENCOUNTER — Other Ambulatory Visit: Payer: Self-pay

## 2019-10-25 ENCOUNTER — Ambulatory Visit
Admission: RE | Admit: 2019-10-25 | Discharge: 2019-10-25 | Disposition: A | Discharge status: Home / Self Care | Payer: 59 | Source: Ambulatory Visit | Attending: Radiation Oncology | Admitting: Radiation Oncology

## 2019-10-25 DIAGNOSIS — C7931 Secondary malignant neoplasm of brain: Secondary | ICD-10-CM | POA: Diagnosis not present

## 2019-10-25 DIAGNOSIS — Z510 Encounter for antineoplastic radiation therapy: Secondary | ICD-10-CM | POA: Diagnosis not present

## 2019-10-25 DIAGNOSIS — C9000 Multiple myeloma not having achieved remission: Secondary | ICD-10-CM | POA: Diagnosis not present

## 2019-10-26 ENCOUNTER — Ambulatory Visit
Admission: RE | Admit: 2019-10-26 | Discharge: 2019-10-26 | Disposition: A | Discharge status: Home / Self Care | Payer: 59 | Source: Ambulatory Visit | Attending: Radiation Oncology | Admitting: Radiation Oncology

## 2019-10-26 ENCOUNTER — Other Ambulatory Visit: Payer: Self-pay

## 2019-10-26 DIAGNOSIS — Z510 Encounter for antineoplastic radiation therapy: Secondary | ICD-10-CM | POA: Diagnosis not present

## 2019-10-26 DIAGNOSIS — C9000 Multiple myeloma not having achieved remission: Secondary | ICD-10-CM | POA: Diagnosis not present

## 2019-10-26 DIAGNOSIS — C7931 Secondary malignant neoplasm of brain: Secondary | ICD-10-CM | POA: Diagnosis not present

## 2019-10-27 ENCOUNTER — Other Ambulatory Visit: Payer: Self-pay

## 2019-10-27 ENCOUNTER — Ambulatory Visit
Admission: RE | Admit: 2019-10-27 | Discharge: 2019-10-27 | Disposition: A | Discharge status: Home / Self Care | Payer: 59 | Source: Ambulatory Visit | Attending: Radiation Oncology | Admitting: Radiation Oncology

## 2019-10-27 ENCOUNTER — Encounter: Payer: Self-pay | Admitting: Emergency Medicine

## 2019-10-27 ENCOUNTER — Ambulatory Visit
Admission: EM | Admit: 2019-10-27 | Discharge: 2019-10-27 | Disposition: A | Discharge status: Home / Self Care | Payer: 59 | Attending: Emergency Medicine | Admitting: Emergency Medicine

## 2019-10-27 DIAGNOSIS — C7931 Secondary malignant neoplasm of brain: Secondary | ICD-10-CM | POA: Diagnosis not present

## 2019-10-27 DIAGNOSIS — R109 Unspecified abdominal pain: Secondary | ICD-10-CM | POA: Insufficient documentation

## 2019-10-27 DIAGNOSIS — Z510 Encounter for antineoplastic radiation therapy: Secondary | ICD-10-CM | POA: Diagnosis not present

## 2019-10-27 DIAGNOSIS — C9000 Multiple myeloma not having achieved remission: Secondary | ICD-10-CM | POA: Diagnosis not present

## 2019-10-27 LAB — POCT URINALYSIS DIP (MANUAL ENTRY)
Bilirubin, UA: NEGATIVE
Blood, UA: NEGATIVE
Glucose, UA: NEGATIVE mg/dL
Ketones, POC UA: NEGATIVE mg/dL
Leukocytes, UA: NEGATIVE
Nitrite, UA: NEGATIVE
Protein Ur, POC: NEGATIVE mg/dL
Spec Grav, UA: 1.025 (ref 1.010–1.025)
Urobilinogen, UA: 0.2 E.U./dL
pH, UA: 5.5 (ref 5.0–8.0)

## 2019-10-27 NOTE — ED Triage Notes (Signed)
RT flank pain since yesterday.  Pain is on and off not only when she moves.  States her urine stream is not as forceful as usual but no burning or frequent urination.

## 2019-10-27 NOTE — Discharge Instructions (Addendum)
Urine culture sent.  We will call you with the results.   Push fluids and get plenty of rest.   Continue OTC Advil as needed for pain/fever Follow-up with PCP and oncologys Return here or go to ER if you have any new or worsening symptoms such as fever, worsening abdominal pain, nausea/vomiting, flank pain, etc..Marland Kitchen

## 2019-10-27 NOTE — ED Provider Notes (Signed)
[facility]   Chief Complaint  Patient presents with  . Flank Pain      SUBJECTIVE:  Melissa [name] is a 64 y.o. female who presents to the urgent care with a complaint of right flank pain and low-grade fever since yesterday. Patient denies a precipitating event, recent sexual encounter, excessive caffeine intake.  Localizes the pain to the lower flank.  Pain is intermittent  and describes it as sharp.  Has tried OTC medications without relief.  Has taken ibuprofen with relief.  Admits to similar symptoms in the past.  Denies fever, chills, nausea, vomiting, abdominal pain, abnormal vaginal discharge or bleeding, hematuria.    LMP: No LMP recorded. Patient is postmenopausal.  ROS: As in HPI.  All other pertinent ROS negative.     Past Medical History:  Diagnosis Date  . 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 Hypothyroidism   . Multiple myeloma   . Perianal abscess   . Peripheral neuropathy   . Seasonal allergies   . Thyroid disease    Past Surgical History:  Procedure Laterality Date  . CATARACT EXTRACTION W/PHACO Right 04/04/2013   Procedure: CATARACT EXTRACTION PHACO AND INTRAOCULAR LENS PLACEMENT (IOC);  Surgeon: Elta Guadeloupe T. Gershon Crane, MD;  Location: [facility];  Service: Ophthalmology;  Laterality: Right;  CDE:3.42  . CATARACT EXTRACTION W/PHACO Left 04/18/2013   Procedure: CATARACT EXTRACTION PHACO AND INTRAOCULAR LENS PLACEMENT (IOC);  Surgeon: Elta Guadeloupe T. Gershon Crane, MD;  Location: [facility];  Service: Ophthalmology;  Laterality: Left;  CDE:4.91  . CESAREAN SECTION  06/1993  . FASCIECTOMY Left 03/02/2017   Procedure: FASCIECTOMY, LEFT SMALL FINGER;  Surgeon: Daryll Brod, MD;  Location: [facility];  Service: Orthopedics;  Laterality: Left;  . HAND SURGERY Left   . TONSILECTOMY, ADENOIDECTOMY, BILATERAL MYRINGOTOMY AND TUBES    . TONSILLECTOMY    . TUBAL LIGATION    . TUMOR REMOVAL  2021  . YAG LASER APPLICATION Right 2/69/4854    Procedure: YAG LASER APPLICATION;  Surgeon: Rutherford Guys, MD;  Location: [facility];  Service: Ophthalmology;  Laterality: Right;  . YAG LASER APPLICATION Left 10/21/348   Procedure: YAG LASER APPLICATION;  Surgeon: Rutherford Guys, MD;  Location: [facility];  Service: Ophthalmology;  Laterality: Left;   Allergies  Allergen Reactions  . Hydromorphone Hcl Nausea And Vomiting  . Poison Ivy Extract [Poison Ivy Extract]   . Tape Rash   Current Facility-Administered Medications on File Prior to Encounter  Medication Dose Route Frequency Provider Last Rate Last Admin  . 0.9 %  sodium chloride infusion   Intravenous Continuous Baird Cancer, PA-C 20 mL/hr at 12/05/10 0947 New Bag at 12/05/10 0947  . 0.9 %  sodium chloride infusion   Intravenous Continuous Everardo All, MD 20 mL/hr at 02/26/11 1528 New Bag at 02/26/11 1528  . sodium chloride 0.9 % injection 10 mL  10 mL Intravenous PRN Everardo All, MD   10 mL at 02/26/11 1518   Current Outpatient Medications on File Prior to Encounter  Medication Sig Dispense Refill  . acetaminophen (TYLENOL) 500 MG tablet Take 1 to 2   tablet(s) by mouth only as needed for pain *OCCASIONAL use only    . acyclovir (ZOVIRAX) 400 MG tablet Take 1 tablet (400 mg total) by mouth 2 (two) times daily. 60 tablet 3  . Calcium Carbonate-Vitamin D (CALCIUM + D PO) Take 1 tablet by mouth daily.    . Cholecalciferol (VITAMIN D) 2000 UNITS  tablet Take 2,000 Units by mouth daily.    Marland Kitchen dexamethasone (DECADRON) 4 MG tablet Take 10 tablets (40 mg total) by mouth once a week. 40 tablet 2  . hydrocortisone-pramoxine (ANALPRAM-HC) 2.5-1 % rectal cream Place 1 application rectally 3 (three) times daily. 30 g 1  . levothyroxine (SYNTHROID) 75 MCG tablet Take 1 tablet (75 mcg total) by mouth daily. 90 tablet 1  . omeprazole (PRILOSEC) 20 MG capsule Take 20 mg by mouth daily.    Marland Kitchen oxyCODONE-acetaminophen (PERCOCET) 10-325 MG tablet Take 1 tablet by mouth every 8 (eight) hours as needed  for pain. 30 tablet 0  . prochlorperazine (COMPAZINE) 10 MG tablet Take 1 tablet (10 mg total) by mouth every 6 (six) hours as needed for nausea or vomiting. 45 tablet 1  . senna-docusate (SENOKOT-S) 8.6-50 MG tablet Take by mouth.     . traMADol (ULTRAM) 50 MG tablet Take 50 mg by mouth every 8 (eight) hours as needed.      Social History   Socioeconomic History  . Marital status: Married    Spouse name: Not on file  . Number of children: 1  . Years of education: Not on file  . Highest education level: Not on file  Occupational History  . Not on file  Tobacco Use  . Smoking status: Never Smoker  . Smokeless tobacco: Never Used  Vaping Use  . Vaping Use: Never used  Substance and Sexual Activity  . Alcohol use: Yes    Comment: occ  . Drug use: No  . Sexual activity: Not Currently    Birth control/protection: Post-menopausal  Other Topics Concern  . Not on file  Social History Narrative   Patient has one daughter.   Social Determinants of Health   Financial Resource Strain:   . Difficulty of Paying Living Expenses:   Food Insecurity:   . Worried About Charity fundraiser in the Last Year:   . Arboriculturist in the Last Year:   Transportation Needs:   . Film/video editor (Medical):   Marland Kitchen Lack of Transportation (Non-Medical):   Physical Activity:   . Days of Exercise per Week:   . Minutes of Exercise per Session:   Stress:   . Feeling of Stress :   Social Connections:   . Frequency of Communication with Friends and Family:   . Frequency of Social Gatherings with Friends and Family:   . Attends Religious Services:   . Active Member of Clubs or Organizations:   . Attends Archivist Meetings:   Marland Kitchen Marital Status:   Intimate Partner Violence:   . Fear of Current or Ex-Partner:   . Emotionally Abused:   Marland Kitchen Physically Abused:   . Sexually Abused:    Family History  Problem Relation Age of Onset  . Hypertension Mother   . Heart attack Mother   .  Diabetes Mother   . Skin cancer Mother   . Hypertension Father   . Heart attack Father   . Skin cancer Father   . Heart disease Maternal Grandmother   . Stroke Maternal Grandfather   . Hypertension Sister   . Skin cancer Sister   . Hypertension Brother   . Skin cancer Brother   . Hypertension Brother   . Cancer Brother        melanoma  . Skin cancer Brother   . Factor V Leiden deficiency Daughter     OBJECTIVE:  Vitals:   10/27/19 1418  BP:  110/70  Pulse: (!) 109  Resp: 17  Temp: 99 F (37.2 C)  TempSrc: Oral   General appearance: AOx3 in no acute distress HEENT: NCAT.  Oropharynx clear.  Lungs: clear to auscultation bilaterally without adventitious breath sounds Heart: regular rate and rhythm.  Radial pulses 2+ symmetrical bilaterally Abdomen: soft; non-distended; no tenderness; bowel sounds present; no guarding or rebound tenderness Back: no CVA tenderness Extremities: no edema; symmetrical with no gross deformities Skin: warm and dry Neurologic: Ambulates from chair to exam table without difficulty Psychological: alert and cooperative; normal mood and affect  Labs Reviewed  POCT URINALYSIS DIP (MANUAL ENTRY)    ASSESSMENT & PLAN:  1. Flank pain     No orders of the defined types were placed in this encounter.  Discharge Instructions Urine culture sent.  We will call you with the results.   Push fluids and get plenty of rest.   Continue OTC Advil as needed for pain/fever Follow-up with PCP and oncologys Return here or go to ER if you have any new or worsening symptoms such as fever, worsening abdominal pain, nausea/vomiting, flank pain, etc...  Outlined signs and symptoms indicating need for more acute intervention. Patient verbalized understanding. After Visit Summary given.  Note: This document was prepared using Dragon voice recognition software and may include unintentional dictation errors.    Emerson Monte, [place] 10/27/19 1451

## 2019-10-29 LAB — URINE CULTURE: Culture: <10000 — AB

## 2019-10-31 ENCOUNTER — Ambulatory Visit
Admission: RE | Admit: 2019-10-31 | Discharge: 2019-10-31 | Disposition: A | Discharge status: Home / Self Care | Payer: 59 | Source: Ambulatory Visit | Attending: Radiation Oncology | Admitting: Radiation Oncology

## 2019-10-31 ENCOUNTER — Other Ambulatory Visit: Payer: Self-pay

## 2019-10-31 DIAGNOSIS — C7931 Secondary malignant neoplasm of brain: Secondary | ICD-10-CM | POA: Diagnosis not present

## 2019-10-31 DIAGNOSIS — Z510 Encounter for antineoplastic radiation therapy: Secondary | ICD-10-CM | POA: Diagnosis not present

## 2019-10-31 DIAGNOSIS — C9000 Multiple myeloma not having achieved remission: Secondary | ICD-10-CM | POA: Diagnosis not present

## 2019-11-01 ENCOUNTER — Other Ambulatory Visit: Payer: Self-pay

## 2019-11-01 ENCOUNTER — Ambulatory Visit
Admission: RE | Admit: 2019-11-01 | Discharge: 2019-11-01 | Disposition: A | Discharge status: Home / Self Care | Payer: 59 | Source: Ambulatory Visit | Attending: Radiation Oncology | Admitting: Radiation Oncology

## 2019-11-01 DIAGNOSIS — Z5111 Encounter for antineoplastic chemotherapy: Secondary | ICD-10-CM | POA: Diagnosis not present

## 2019-11-01 DIAGNOSIS — R109 Unspecified abdominal pain: Secondary | ICD-10-CM | POA: Diagnosis not present

## 2019-11-01 DIAGNOSIS — C9002 Multiple myeloma in relapse: Secondary | ICD-10-CM | POA: Diagnosis not present

## 2019-11-01 DIAGNOSIS — C7931 Secondary malignant neoplasm of brain: Secondary | ICD-10-CM | POA: Diagnosis not present

## 2019-11-01 DIAGNOSIS — C9001 Multiple myeloma in remission: Secondary | ICD-10-CM | POA: Diagnosis not present

## 2019-11-01 DIAGNOSIS — Z510 Encounter for antineoplastic radiation therapy: Secondary | ICD-10-CM | POA: Diagnosis not present

## 2019-11-01 DIAGNOSIS — C9000 Multiple myeloma not having achieved remission: Secondary | ICD-10-CM | POA: Diagnosis not present

## 2019-11-02 ENCOUNTER — Other Ambulatory Visit: Payer: Self-pay

## 2019-11-02 ENCOUNTER — Ambulatory Visit
Admission: RE | Admit: 2019-11-02 | Discharge: 2019-11-02 | Disposition: A | Discharge status: Home / Self Care | Payer: 59 | Source: Ambulatory Visit | Attending: Radiation Oncology | Admitting: Radiation Oncology

## 2019-11-02 DIAGNOSIS — G902 Horner's syndrome: Secondary | ICD-10-CM | POA: Diagnosis not present

## 2019-11-02 DIAGNOSIS — C7931 Secondary malignant neoplasm of brain: Secondary | ICD-10-CM | POA: Diagnosis not present

## 2019-11-02 DIAGNOSIS — Z510 Encounter for antineoplastic radiation therapy: Secondary | ICD-10-CM | POA: Diagnosis not present

## 2019-11-02 DIAGNOSIS — C9000 Multiple myeloma not having achieved remission: Secondary | ICD-10-CM | POA: Diagnosis not present

## 2019-11-02 DIAGNOSIS — H4922 Sixth [abducent] nerve palsy, left eye: Secondary | ICD-10-CM | POA: Diagnosis not present

## 2019-11-03 ENCOUNTER — Ambulatory Visit
Admission: RE | Admit: 2019-11-03 | Discharge: 2019-11-03 | Disposition: A | Discharge status: Home / Self Care | Payer: 59 | Source: Ambulatory Visit | Attending: Radiation Oncology | Admitting: Radiation Oncology

## 2019-11-03 ENCOUNTER — Other Ambulatory Visit: Payer: Self-pay

## 2019-11-03 DIAGNOSIS — Z510 Encounter for antineoplastic radiation therapy: Secondary | ICD-10-CM | POA: Diagnosis not present

## 2019-11-03 DIAGNOSIS — C7931 Secondary malignant neoplasm of brain: Secondary | ICD-10-CM | POA: Diagnosis not present

## 2019-11-03 DIAGNOSIS — C9000 Multiple myeloma not having achieved remission: Secondary | ICD-10-CM | POA: Diagnosis not present

## 2019-11-06 ENCOUNTER — Other Ambulatory Visit: Payer: Self-pay

## 2019-11-06 ENCOUNTER — Ambulatory Visit: Payer: 59

## 2019-11-06 ENCOUNTER — Ambulatory Visit
Admission: RE | Admit: 2019-11-06 | Discharge: 2019-11-06 | Disposition: A | Discharge status: Home / Self Care | Payer: 59 | Source: Ambulatory Visit | Attending: Radiation Oncology | Admitting: Radiation Oncology

## 2019-11-06 DIAGNOSIS — C7931 Secondary malignant neoplasm of brain: Secondary | ICD-10-CM | POA: Diagnosis not present

## 2019-11-06 DIAGNOSIS — C9000 Multiple myeloma not having achieved remission: Secondary | ICD-10-CM | POA: Diagnosis not present

## 2019-11-06 DIAGNOSIS — Z510 Encounter for antineoplastic radiation therapy: Secondary | ICD-10-CM | POA: Diagnosis not present

## 2019-11-07 ENCOUNTER — Encounter: Payer: Self-pay | Admitting: Family Medicine

## 2019-11-07 ENCOUNTER — Ambulatory Visit: Payer: 59 | Admitting: Family Medicine

## 2019-11-07 ENCOUNTER — Other Ambulatory Visit: Payer: Self-pay

## 2019-11-07 ENCOUNTER — Ambulatory Visit
Admission: RE | Admit: 2019-11-07 | Discharge: 2019-11-07 | Disposition: A | Discharge status: Home / Self Care | Payer: 59 | Source: Ambulatory Visit | Attending: Radiation Oncology | Admitting: Radiation Oncology

## 2019-11-07 ENCOUNTER — Telehealth: Payer: Self-pay | Admitting: Family Medicine

## 2019-11-07 ENCOUNTER — Encounter: Payer: Self-pay | Admitting: Urology

## 2019-11-07 VITALS — BP 122/78 | Temp 96.5°F | Wt 116.6 lb

## 2019-11-07 DIAGNOSIS — C9000 Multiple myeloma not having achieved remission: Secondary | ICD-10-CM

## 2019-11-07 DIAGNOSIS — Z1211 Encounter for screening for malignant neoplasm of colon: Secondary | ICD-10-CM | POA: Diagnosis not present

## 2019-11-07 DIAGNOSIS — R109 Unspecified abdominal pain: Secondary | ICD-10-CM

## 2019-11-07 DIAGNOSIS — C7931 Secondary malignant neoplasm of brain: Secondary | ICD-10-CM | POA: Diagnosis not present

## 2019-11-07 DIAGNOSIS — Z510 Encounter for antineoplastic radiation therapy: Secondary | ICD-10-CM | POA: Diagnosis not present

## 2019-11-07 LAB — POCT URINALYSIS DIPSTICK
Spec Grav, UA: 1.01 (ref 1.010–1.025)
pH, UA: 6 (ref 5.0–8.0)

## 2019-11-07 NOTE — Telephone Encounter (Signed)
Pt would like an appt for right side pain. States it is pretty bad. [facility] and [facility] think it could be kidney stones and want her to follow up with PCP. Dr. Nicki Reaper is booked this week. Pt goes to [facility] every other week for chemo. She was there last week.

## 2019-11-07 NOTE — Progress Notes (Signed)
   Subjective:    Patient ID: Melissa [name], female    DOB: 11/09/1955, 64 y.o.   MRN: [id number]  Flank Pain This is a new problem. Episode onset: 2 weeks. Exacerbated by: walking. (Nausea When laying down pain is not as bad, sometime burning with urination and trouble with urination. Tender to touch. Low grade fever at times) Treatments tried: Urgent Care on 10/27/19.  Kidney ultrasound done at [facility]. Patient have significant flank pain and discomfort.  Did have some blood in her urine at [facility] does not have any blood in her urine today patient denies high fever chills or sweats denies wheezing difficulty breathing she is being treated for recurrence of cancer Pt would also like referral to GI for colonoscopy-Dr.Rourke  Review of Systems  Genitourinary: Positive for flank pain.   Denies high fever chills sweats    Objective:   Physical Exam Lungs clear heart regular abdomen soft flank nontender  Patient has multiple myeloma being treated by [facility]:  Flank pain Stat scan to rule out kidney stone recommended Await the results May need referral back to oncology at [facility] Referral for colonoscopy.

## 2019-11-07 NOTE — Telephone Encounter (Signed)
Please put patient on the schedule for 2:30 PM Please have her come around that time she should expect to give a urine when she comes clean-catch And let her know we are working her into the schedule-hopefully her wait will not be long

## 2019-11-07 NOTE — Telephone Encounter (Signed)
Pt scheduled 2:30 today.

## 2019-11-07 NOTE — Telephone Encounter (Signed)
Contacted patient. Pt states that she has been having right flank pain for about 2 weeks. Went to Urgent Care on 10/27/19. Pt also goes to [facility] and they did a urinalysis but did not follow up with culture. UA positive for WBC and RBC. [facility] states that they need to rule out kidney stones. Pt states she needs a urinalysis, culture and possible a CT scan. Pt states the pain is sharp mostly when walking or sitting upright, if still the pain is ok. Having some issues with urine stream. Has been Tramadol for pain. Has has some nausea and vomiting. Some tenderness in flank area. Pain will sometime move to front area but mostly stays in flank area. Please advise. Thank you   [facility]

## 2019-11-08 ENCOUNTER — Ambulatory Visit (HOSPITAL_COMMUNITY): Payer: 59

## 2019-11-08 ENCOUNTER — Telehealth: Payer: Self-pay | Admitting: Family Medicine

## 2019-11-08 ENCOUNTER — Ambulatory Visit (HOSPITAL_COMMUNITY)
Admission: RE | Admit: 2019-11-08 | Discharge: 2019-11-08 | Disposition: A | Discharge status: Home / Self Care | Payer: 59 | Source: Ambulatory Visit | Attending: Family Medicine | Admitting: Family Medicine

## 2019-11-08 DIAGNOSIS — I728 Aneurysm of other specified arteries: Secondary | ICD-10-CM | POA: Diagnosis not present

## 2019-11-08 DIAGNOSIS — I7090 Unspecified atherosclerosis: Secondary | ICD-10-CM | POA: Diagnosis not present

## 2019-11-08 DIAGNOSIS — R109 Unspecified abdominal pain: Secondary | ICD-10-CM | POA: Insufficient documentation

## 2019-11-08 NOTE — Telephone Encounter (Signed)
Nurses-I have spoken with the patient regarding the test  CT scan negative for stones Does have gallstones but this is unlikely to be causing the trouble I will need to speak with her cancer specialists [name] in order to relay these results please call [facility] let her speak with me personally Thanks-Dr. Nicki Reaper

## 2019-11-08 NOTE — Telephone Encounter (Signed)
I did speak with her specialist at [facility].  They stated that they will be reaching out to her to schedule her for her test.  Please inform Melissa [name].

## 2019-11-08 NOTE — Progress Notes (Signed)
Dr. [name] notified by messenger, the report is available in [place].

## 2019-11-08 NOTE — Addendum Note (Signed)
Addended by: Dairl Ponder on: 11/08/2019 08:49 AM   Modules accepted: Orders

## 2019-11-08 NOTE — Telephone Encounter (Signed)
Contacted [facility] and gave cell number and office number for Dr.Stilp to contact Dr.Scott.

## 2019-11-08 NOTE — Telephone Encounter (Signed)
Pt contacted and verbalized understanding.  

## 2019-11-12 MED FILL — ONDANSETRON HCL 8 MG TABLET: 8 | 6 days supply | Qty: 20 | Fill #1

## 2019-11-13 DIAGNOSIS — C9000 Multiple myeloma not having achieved remission: Secondary | ICD-10-CM | POA: Diagnosis not present

## 2019-11-13 DIAGNOSIS — G8929 Other chronic pain: Secondary | ICD-10-CM | POA: Diagnosis not present

## 2019-11-13 DIAGNOSIS — Z9484 Stem cells transplant status: Secondary | ICD-10-CM | POA: Diagnosis not present

## 2019-11-13 DIAGNOSIS — C9002 Multiple myeloma in relapse: Secondary | ICD-10-CM | POA: Diagnosis not present

## 2019-11-13 DIAGNOSIS — Z5111 Encounter for antineoplastic chemotherapy: Secondary | ICD-10-CM | POA: Diagnosis not present

## 2019-11-13 DIAGNOSIS — R109 Unspecified abdominal pain: Secondary | ICD-10-CM | POA: Diagnosis not present

## 2019-11-13 DIAGNOSIS — B001 Herpesviral vesicular dermatitis: Secondary | ICD-10-CM | POA: Diagnosis not present

## 2019-11-13 DIAGNOSIS — M546 Pain in thoracic spine: Secondary | ICD-10-CM | POA: Diagnosis not present

## 2019-11-13 DIAGNOSIS — Z862 Personal history of diseases of the blood and blood-forming organs and certain disorders involving the immune mechanism: Secondary | ICD-10-CM | POA: Diagnosis not present

## 2019-11-13 DIAGNOSIS — Z7983 Long term (current) use of bisphosphonates: Secondary | ICD-10-CM | POA: Diagnosis not present

## 2019-11-13 DIAGNOSIS — D61818 Other pancytopenia: Secondary | ICD-10-CM | POA: Diagnosis not present

## 2019-11-13 DIAGNOSIS — Z79899 Other long term (current) drug therapy: Secondary | ICD-10-CM | POA: Diagnosis not present

## 2019-11-13 MED FILL — ACYCLOVIR 400 MG TABLET: 400 | 30 days supply | Qty: 60 | Fill #2

## 2019-11-13 MED FILL — DEXAMETHASONE 4 MG TABLET: 4 | 28 days supply | Qty: 10 | Fill #2

## 2019-11-14 DIAGNOSIS — C9002 Multiple myeloma in relapse: Secondary | ICD-10-CM | POA: Diagnosis not present

## 2019-11-14 DIAGNOSIS — C9001 Multiple myeloma in remission: Secondary | ICD-10-CM | POA: Diagnosis not present

## 2019-11-14 DIAGNOSIS — C9030 Solitary plasmacytoma not having achieved remission: Secondary | ICD-10-CM | POA: Diagnosis not present

## 2019-11-15 DIAGNOSIS — R918 Other nonspecific abnormal finding of lung field: Secondary | ICD-10-CM | POA: Diagnosis not present

## 2019-11-15 DIAGNOSIS — C9000 Multiple myeloma not having achieved remission: Secondary | ICD-10-CM | POA: Diagnosis not present

## 2019-11-16 ENCOUNTER — Other Ambulatory Visit: Payer: Self-pay | Admitting: Adult Health

## 2019-11-16 MED ORDER — HYDROCORT-PRAMOXINE (PERIANAL) 2.5-1 % EX CREA: 1.0000 "application " | TOPICAL_CREAM | Freq: Three times a day (TID) | CUTANEOUS | 1 refills | Status: AC

## 2019-11-16 MED FILL — HYDROCORT-PRAMOXINE 2.5%-1%: 2.5-1 | 4 days supply | Qty: 48 | Fill #0

## 2019-11-16 NOTE — Progress Notes (Signed)
Refill analpram HC

## 2019-11-21 ENCOUNTER — Encounter: Payer: Self-pay | Admitting: Family Medicine

## 2019-11-21 DIAGNOSIS — D849 Immunodeficiency, unspecified: Secondary | ICD-10-CM | POA: Diagnosis not present

## 2019-11-21 DIAGNOSIS — D61810 Antineoplastic chemotherapy induced pancytopenia: Secondary | ICD-10-CM | POA: Diagnosis not present

## 2019-11-21 DIAGNOSIS — G4485 Primary stabbing headache: Secondary | ICD-10-CM | POA: Diagnosis not present

## 2019-11-21 DIAGNOSIS — E538 Deficiency of other specified B group vitamins: Secondary | ICD-10-CM | POA: Diagnosis not present

## 2019-11-21 DIAGNOSIS — C7931 Secondary malignant neoplasm of brain: Secondary | ICD-10-CM | POA: Diagnosis not present

## 2019-11-21 DIAGNOSIS — G92 Toxic encephalopathy: Secondary | ICD-10-CM | POA: Diagnosis not present

## 2019-11-21 DIAGNOSIS — R4701 Aphasia: Secondary | ICD-10-CM | POA: Diagnosis not present

## 2019-11-21 DIAGNOSIS — R519 Headache, unspecified: Secondary | ICD-10-CM | POA: Diagnosis not present

## 2019-11-21 DIAGNOSIS — G0490 Encephalitis and encephalomyelitis, unspecified: Secondary | ICD-10-CM | POA: Diagnosis not present

## 2019-11-21 DIAGNOSIS — C7949 Secondary malignant neoplasm of other parts of nervous system: Secondary | ICD-10-CM | POA: Diagnosis not present

## 2019-11-21 DIAGNOSIS — R509 Fever, unspecified: Secondary | ICD-10-CM | POA: Diagnosis not present

## 2019-11-21 DIAGNOSIS — C9002 Multiple myeloma in relapse: Secondary | ICD-10-CM | POA: Diagnosis not present

## 2019-11-21 DIAGNOSIS — E039 Hypothyroidism, unspecified: Secondary | ICD-10-CM | POA: Diagnosis not present

## 2019-11-21 DIAGNOSIS — D61818 Other pancytopenia: Secondary | ICD-10-CM | POA: Diagnosis not present

## 2019-11-21 DIAGNOSIS — D531 Other megaloblastic anemias, not elsewhere classified: Secondary | ICD-10-CM | POA: Diagnosis not present

## 2019-11-21 DIAGNOSIS — Z01818 Encounter for other preprocedural examination: Secondary | ICD-10-CM | POA: Diagnosis not present

## 2019-11-21 DIAGNOSIS — Z20822 Contact with and (suspected) exposure to covid-19: Secondary | ICD-10-CM | POA: Diagnosis not present

## 2019-11-21 DIAGNOSIS — C9030 Solitary plasmacytoma not having achieved remission: Secondary | ICD-10-CM | POA: Diagnosis not present

## 2019-11-21 DIAGNOSIS — Z9484 Stem cells transplant status: Secondary | ICD-10-CM | POA: Diagnosis not present

## 2019-11-21 DIAGNOSIS — M545 Low back pain: Secondary | ICD-10-CM | POA: Diagnosis not present

## 2019-11-21 DIAGNOSIS — Z5111 Encounter for antineoplastic chemotherapy: Secondary | ICD-10-CM | POA: Diagnosis not present

## 2019-11-21 DIAGNOSIS — C9000 Multiple myeloma not having achieved remission: Secondary | ICD-10-CM | POA: Diagnosis not present

## 2019-11-21 DIAGNOSIS — Z95828 Presence of other vascular implants and grafts: Secondary | ICD-10-CM | POA: Diagnosis not present

## 2019-11-21 DIAGNOSIS — G8929 Other chronic pain: Secondary | ICD-10-CM | POA: Diagnosis not present

## 2019-11-21 DIAGNOSIS — K219 Gastro-esophageal reflux disease without esophagitis: Secondary | ICD-10-CM | POA: Diagnosis not present

## 2019-11-21 DIAGNOSIS — G0481 Other encephalitis and encephalomyelitis: Secondary | ICD-10-CM | POA: Diagnosis not present

## 2019-11-21 DIAGNOSIS — Z982 Presence of cerebrospinal fluid drainage device: Secondary | ICD-10-CM | POA: Diagnosis not present

## 2019-11-21 DIAGNOSIS — R836 Abnormal cytological findings in cerebrospinal fluid: Secondary | ICD-10-CM | POA: Diagnosis not present

## 2019-11-22 DIAGNOSIS — Z20822 Contact with and (suspected) exposure to covid-19: Secondary | ICD-10-CM | POA: Diagnosis not present

## 2019-11-22 DIAGNOSIS — G92 Toxic encephalopathy: Secondary | ICD-10-CM | POA: Diagnosis not present

## 2019-11-23 DIAGNOSIS — G92 Toxic encephalopathy: Secondary | ICD-10-CM | POA: Diagnosis not present

## 2019-11-23 DIAGNOSIS — Z20822 Contact with and (suspected) exposure to covid-19: Secondary | ICD-10-CM | POA: Diagnosis not present

## 2019-11-24 DIAGNOSIS — Z20822 Contact with and (suspected) exposure to covid-19: Secondary | ICD-10-CM | POA: Diagnosis not present

## 2019-11-24 DIAGNOSIS — G92 Toxic encephalopathy: Secondary | ICD-10-CM | POA: Diagnosis not present

## 2019-11-25 DIAGNOSIS — Z20822 Contact with and (suspected) exposure to covid-19: Secondary | ICD-10-CM | POA: Diagnosis not present

## 2019-11-25 DIAGNOSIS — G92 Toxic encephalopathy: Secondary | ICD-10-CM | POA: Diagnosis not present

## 2019-11-26 DIAGNOSIS — G92 Toxic encephalopathy: Secondary | ICD-10-CM | POA: Diagnosis not present

## 2019-11-26 DIAGNOSIS — Z20822 Contact with and (suspected) exposure to covid-19: Secondary | ICD-10-CM | POA: Diagnosis not present

## 2019-11-27 ENCOUNTER — Encounter: Payer: Self-pay | Admitting: Internal Medicine

## 2019-11-27 DIAGNOSIS — C9000 Multiple myeloma not having achieved remission: Secondary | ICD-10-CM | POA: Diagnosis not present

## 2019-11-27 DIAGNOSIS — Z20822 Contact with and (suspected) exposure to covid-19: Secondary | ICD-10-CM | POA: Diagnosis not present

## 2019-11-27 DIAGNOSIS — C9002 Multiple myeloma in relapse: Secondary | ICD-10-CM | POA: Diagnosis not present

## 2019-11-27 DIAGNOSIS — Z5111 Encounter for antineoplastic chemotherapy: Secondary | ICD-10-CM | POA: Diagnosis not present

## 2019-11-27 DIAGNOSIS — G92 Toxic encephalopathy: Secondary | ICD-10-CM | POA: Diagnosis not present

## 2019-11-27 DIAGNOSIS — R509 Fever, unspecified: Secondary | ICD-10-CM | POA: Diagnosis not present

## 2019-11-28 ENCOUNTER — Other Ambulatory Visit: Payer: Self-pay | Admitting: *Deleted

## 2019-11-28 DIAGNOSIS — G92 Toxic encephalopathy: Secondary | ICD-10-CM | POA: Diagnosis not present

## 2019-11-28 DIAGNOSIS — Z20822 Contact with and (suspected) exposure to covid-19: Secondary | ICD-10-CM | POA: Diagnosis not present

## 2019-11-28 DIAGNOSIS — Z5111 Encounter for antineoplastic chemotherapy: Secondary | ICD-10-CM | POA: Diagnosis not present

## 2019-11-28 NOTE — Patient Outreach (Signed)
[facility] [facility]) Care Management  11/28/2019  Luke Rigsbee Ishaq January 20, 1956 [id number]   Transition of care note   Referral received:11/21/19 Insurance: [facility]    Referral received on 11/21/19 , noted patient Admission to [facility] on 7/27-11/26/19. Patient was readmitted on 11/27/19.   Objective: Per the electronic medical record, Mr. Ulloa  was hospitalized at [facility] from 7/27-11/26/19 for Multiple Myeloma with recent CNS progression,  Ommaya reservoir placment for weekly intrathecal Cytarabine treatments. Past Medical history include: Multiple Myeloma s/p autologous stem cell transplant 2010, Thoracic Laminectomy 05/04/19, Right Craniotomy 6//8/21, hypothyroidism.  She  was discharged to home on 11/26/19 without the need for home health services or durable medical equipment per the discharge summary. Noted patient readmission on 11/27/19 with fever and chills.   Plan: Will monitor progress and plan transition of care outreach based on discharge disposition .    Joylene Draft, RN, BSN  [place] Management Coordinator  [phone]- Mobile [phone]- Toll Free Main Office

## 2019-11-29 DIAGNOSIS — G92 Toxic encephalopathy: Secondary | ICD-10-CM | POA: Diagnosis not present

## 2019-11-29 DIAGNOSIS — Z982 Presence of cerebrospinal fluid drainage device: Secondary | ICD-10-CM | POA: Diagnosis not present

## 2019-11-29 DIAGNOSIS — Z5111 Encounter for antineoplastic chemotherapy: Secondary | ICD-10-CM | POA: Diagnosis not present

## 2019-11-29 DIAGNOSIS — Z20822 Contact with and (suspected) exposure to covid-19: Secondary | ICD-10-CM | POA: Diagnosis not present

## 2019-11-29 DIAGNOSIS — G4485 Primary stabbing headache: Secondary | ICD-10-CM | POA: Diagnosis not present

## 2019-11-29 DIAGNOSIS — C9030 Solitary plasmacytoma not having achieved remission: Secondary | ICD-10-CM | POA: Diagnosis not present

## 2019-11-30 DIAGNOSIS — Z20822 Contact with and (suspected) exposure to covid-19: Secondary | ICD-10-CM | POA: Diagnosis not present

## 2019-11-30 DIAGNOSIS — G92 Toxic encephalopathy: Secondary | ICD-10-CM | POA: Diagnosis not present

## 2019-11-30 DIAGNOSIS — Z5111 Encounter for antineoplastic chemotherapy: Secondary | ICD-10-CM | POA: Diagnosis not present

## 2019-12-01 DIAGNOSIS — G92 Toxic encephalopathy: Secondary | ICD-10-CM | POA: Diagnosis not present

## 2019-12-01 DIAGNOSIS — Z5111 Encounter for antineoplastic chemotherapy: Secondary | ICD-10-CM | POA: Diagnosis not present

## 2019-12-01 DIAGNOSIS — Z20822 Contact with and (suspected) exposure to covid-19: Secondary | ICD-10-CM | POA: Diagnosis not present

## 2019-12-02 DIAGNOSIS — G92 Toxic encephalopathy: Secondary | ICD-10-CM | POA: Diagnosis not present

## 2019-12-02 DIAGNOSIS — Z5111 Encounter for antineoplastic chemotherapy: Secondary | ICD-10-CM | POA: Diagnosis not present

## 2019-12-02 DIAGNOSIS — Z20822 Contact with and (suspected) exposure to covid-19: Secondary | ICD-10-CM | POA: Diagnosis not present

## 2019-12-03 DIAGNOSIS — G92 Toxic encephalopathy: Secondary | ICD-10-CM | POA: Diagnosis not present

## 2019-12-03 DIAGNOSIS — Z5111 Encounter for antineoplastic chemotherapy: Secondary | ICD-10-CM | POA: Diagnosis not present

## 2019-12-03 DIAGNOSIS — Z20822 Contact with and (suspected) exposure to covid-19: Secondary | ICD-10-CM | POA: Diagnosis not present

## 2019-12-04 DIAGNOSIS — Z20822 Contact with and (suspected) exposure to covid-19: Secondary | ICD-10-CM | POA: Diagnosis not present

## 2019-12-04 DIAGNOSIS — Z5111 Encounter for antineoplastic chemotherapy: Secondary | ICD-10-CM | POA: Diagnosis not present

## 2019-12-04 DIAGNOSIS — G92 Toxic encephalopathy: Secondary | ICD-10-CM | POA: Diagnosis not present

## 2019-12-05 DIAGNOSIS — G92 Toxic encephalopathy: Secondary | ICD-10-CM | POA: Diagnosis not present

## 2019-12-05 DIAGNOSIS — Z5111 Encounter for antineoplastic chemotherapy: Secondary | ICD-10-CM | POA: Diagnosis not present

## 2019-12-05 DIAGNOSIS — Z20822 Contact with and (suspected) exposure to covid-19: Secondary | ICD-10-CM | POA: Diagnosis not present

## 2019-12-06 DIAGNOSIS — G92 Toxic encephalopathy: Secondary | ICD-10-CM | POA: Diagnosis not present

## 2019-12-06 DIAGNOSIS — Z5111 Encounter for antineoplastic chemotherapy: Secondary | ICD-10-CM | POA: Diagnosis not present

## 2019-12-06 DIAGNOSIS — Z20822 Contact with and (suspected) exposure to covid-19: Secondary | ICD-10-CM | POA: Diagnosis not present

## 2019-12-07 DIAGNOSIS — C9002 Multiple myeloma in relapse: Secondary | ICD-10-CM | POA: Diagnosis not present

## 2019-12-07 DIAGNOSIS — G92 Toxic encephalopathy: Secondary | ICD-10-CM | POA: Diagnosis not present

## 2019-12-07 DIAGNOSIS — Z20822 Contact with and (suspected) exposure to covid-19: Secondary | ICD-10-CM | POA: Diagnosis not present

## 2019-12-07 DIAGNOSIS — M545 Low back pain: Secondary | ICD-10-CM | POA: Diagnosis not present

## 2019-12-07 DIAGNOSIS — Z5111 Encounter for antineoplastic chemotherapy: Secondary | ICD-10-CM | POA: Diagnosis not present

## 2019-12-07 DIAGNOSIS — D61818 Other pancytopenia: Secondary | ICD-10-CM | POA: Diagnosis not present

## 2019-12-07 DIAGNOSIS — E538 Deficiency of other specified B group vitamins: Secondary | ICD-10-CM | POA: Diagnosis not present

## 2019-12-08 DIAGNOSIS — D61818 Other pancytopenia: Secondary | ICD-10-CM | POA: Diagnosis not present

## 2019-12-08 DIAGNOSIS — C9002 Multiple myeloma in relapse: Secondary | ICD-10-CM | POA: Diagnosis not present

## 2019-12-08 DIAGNOSIS — E538 Deficiency of other specified B group vitamins: Secondary | ICD-10-CM | POA: Diagnosis not present

## 2019-12-08 DIAGNOSIS — Z5111 Encounter for antineoplastic chemotherapy: Secondary | ICD-10-CM | POA: Diagnosis not present

## 2019-12-08 DIAGNOSIS — Z20822 Contact with and (suspected) exposure to covid-19: Secondary | ICD-10-CM | POA: Diagnosis not present

## 2019-12-08 DIAGNOSIS — M545 Low back pain: Secondary | ICD-10-CM | POA: Diagnosis not present

## 2019-12-08 DIAGNOSIS — Z95828 Presence of other vascular implants and grafts: Secondary | ICD-10-CM | POA: Diagnosis not present

## 2019-12-08 DIAGNOSIS — G92 Toxic encephalopathy: Secondary | ICD-10-CM | POA: Diagnosis not present

## 2019-12-09 DIAGNOSIS — Z5111 Encounter for antineoplastic chemotherapy: Secondary | ICD-10-CM | POA: Diagnosis not present

## 2019-12-09 DIAGNOSIS — G92 Toxic encephalopathy: Secondary | ICD-10-CM | POA: Diagnosis not present

## 2019-12-09 DIAGNOSIS — M545 Low back pain: Secondary | ICD-10-CM | POA: Diagnosis not present

## 2019-12-09 DIAGNOSIS — D61818 Other pancytopenia: Secondary | ICD-10-CM | POA: Diagnosis not present

## 2019-12-09 DIAGNOSIS — E538 Deficiency of other specified B group vitamins: Secondary | ICD-10-CM | POA: Diagnosis not present

## 2019-12-09 DIAGNOSIS — C9002 Multiple myeloma in relapse: Secondary | ICD-10-CM | POA: Diagnosis not present

## 2019-12-09 DIAGNOSIS — Z95828 Presence of other vascular implants and grafts: Secondary | ICD-10-CM | POA: Diagnosis not present

## 2019-12-09 DIAGNOSIS — Z20822 Contact with and (suspected) exposure to covid-19: Secondary | ICD-10-CM | POA: Diagnosis not present

## 2019-12-09 DIAGNOSIS — R509 Fever, unspecified: Secondary | ICD-10-CM | POA: Diagnosis not present

## 2019-12-10 DIAGNOSIS — Z5111 Encounter for antineoplastic chemotherapy: Secondary | ICD-10-CM | POA: Diagnosis not present

## 2019-12-10 DIAGNOSIS — M545 Low back pain: Secondary | ICD-10-CM | POA: Diagnosis not present

## 2019-12-10 DIAGNOSIS — Z95828 Presence of other vascular implants and grafts: Secondary | ICD-10-CM | POA: Diagnosis not present

## 2019-12-10 DIAGNOSIS — Z20822 Contact with and (suspected) exposure to covid-19: Secondary | ICD-10-CM | POA: Diagnosis not present

## 2019-12-10 DIAGNOSIS — E538 Deficiency of other specified B group vitamins: Secondary | ICD-10-CM | POA: Diagnosis not present

## 2019-12-10 DIAGNOSIS — D61818 Other pancytopenia: Secondary | ICD-10-CM | POA: Diagnosis not present

## 2019-12-10 DIAGNOSIS — G92 Toxic encephalopathy: Secondary | ICD-10-CM | POA: Diagnosis not present

## 2019-12-10 DIAGNOSIS — R509 Fever, unspecified: Secondary | ICD-10-CM | POA: Diagnosis not present

## 2019-12-10 DIAGNOSIS — C9002 Multiple myeloma in relapse: Secondary | ICD-10-CM | POA: Diagnosis not present

## 2019-12-12 ENCOUNTER — Other Ambulatory Visit: Payer: Self-pay | Admitting: *Deleted

## 2019-12-12 ENCOUNTER — Encounter: Payer: Self-pay | Admitting: *Deleted

## 2019-12-12 ENCOUNTER — Telehealth: Payer: Self-pay

## 2019-12-12 ENCOUNTER — Encounter: Payer: Self-pay | Admitting: Urology

## 2019-12-12 ENCOUNTER — Encounter (HOSPITAL_COMMUNITY)
Admission: RE | Admit: 2019-12-12 | Discharge: 2019-12-12 | Disposition: A | Discharge status: Home / Self Care | Payer: 59 | Source: Ambulatory Visit | Attending: Physician Assistant | Admitting: Physician Assistant

## 2019-12-12 DIAGNOSIS — C9000 Multiple myeloma not having achieved remission: Secondary | ICD-10-CM | POA: Insufficient documentation

## 2019-12-12 LAB — COMPREHENSIVE METABOLIC PANEL
ALT: 15 U/L (ref 0–44)
AST: 16 U/L (ref 15–41)
Albumin: 3.5 g/dL (ref 3.5–5.0)
Alkaline Phosphatase: 164 U/L — ABNORMAL HIGH (ref 38–126)
Anion gap: 8 (ref 5–15)
BUN: 13 mg/dL (ref 8–23)
CO2: 25 mmol/L (ref 22–32)
Calcium: 8.7 mg/dL — ABNORMAL LOW (ref 8.9–10.3)
Chloride: 106 mmol/L (ref 98–111)
Creatinine, Ser: 0.68 mg/dL (ref 0.44–1.00)
GFR calc Af Amer: >60 mL/min (ref >60)
GFR calc non Af Amer: >60 mL/min (ref >60)
Glucose, Bld: 110 mg/dL — ABNORMAL HIGH (ref 70–99)
Potassium: 3.7 mmol/L (ref 3.5–5.1)
Sodium: 139 mmol/L (ref 135–145)
Total Bilirubin: 0.3 mg/dL (ref 0.3–1.2)
Total Protein: 6.1 g/dL — ABNORMAL LOW (ref 6.5–8.1)

## 2019-12-12 LAB — CBC WITH DIFFERENTIAL/PLATELET
Abs Immature Granulocytes: 0.16 10*3/uL — ABNORMAL HIGH (ref 0.00–0.07)
Basophils Absolute: 0 10*3/uL (ref 0.0–0.1)
Basophils Relative: 0 %
Eosinophils Absolute: 0 10*3/uL (ref 0.0–0.5)
Eosinophils Relative: 0 %
HCT: 29 % — ABNORMAL LOW (ref 36.0–46.0)
Hemoglobin: 9.5 g/dL — ABNORMAL LOW (ref 12.0–15.0)
Immature Granulocytes: 6 %
Lymphocytes Relative: 7 %
Lymphs Abs: 0.2 10*3/uL — ABNORMAL LOW (ref 0.7–4.0)
MCH: 33.5 pg (ref 26.0–34.0)
MCHC: 32.8 g/dL (ref 30.0–36.0)
MCV: 102.1 fL — ABNORMAL HIGH (ref 80.0–100.0)
Monocytes Absolute: 0.5 10*3/uL (ref 0.1–1.0)
Monocytes Relative: 19 %
Neutro Abs: 1.7 10*3/uL (ref 1.7–7.7)
Neutrophils Relative %: 68 %
Platelets: 23 10*3/uL — CL (ref 150–400)
RBC: 2.84 MIL/uL — ABNORMAL LOW (ref 3.87–5.11)
RDW: 17.1 % — ABNORMAL HIGH (ref 11.5–15.5)
WBC: 2.5 10*3/uL — ABNORMAL LOW (ref 4.0–10.5)
nRBC: 0.8 % — ABNORMAL HIGH (ref 0.0–0.2)

## 2019-12-12 NOTE — Telephone Encounter (Signed)
Patient returned call in regards to voicemail message for telephone appointment with Freeman Caldron PA on 12/13/19 at 2:00pm. Reviewed meaningful use questions. TM

## 2019-12-12 NOTE — Telephone Encounter (Signed)
Left voicemail message to call back in regards to telephone appointment on 12/13/19 at 2:00pm. Called to review meaningful use questions. TM

## 2019-12-12 NOTE — Patient Outreach (Signed)
[facility]) Care Management  12/12/2019  Melissa [name] 1955/06/28 [id number]   Transition of care call/case closure   Referral received:11/21/19 Initial outreach:12/12/19 Insurance: [employer]    Subjective: Initial successful telephone call to patient's preferred number in order to complete transition of care assessment; 2 HIPAA identifiers verified. Explained purpose of call and completed transition of care assessment.  Melissa [name] states that she is doing much better. She denies having fever, dizziness or nausea. She reports Ommaya reservoir  site at Right scalp without signs of redness.She reports pain managed with prn medications. She  tolerating diet, denies bowel or bladder problems.  Spouse is  assisting with his/her recovery. Patient discussed plan for return appointment on 8/19 and plans for chemotherapy on 9/23. .  She does not have the hospital indemnity plan she is currently on extended leave. she uses a Company secretary outpatient pharmacy at [facility] outpatient pharmacy.  Objective:  Melissa [name] hospitalized at [facility] from 7/27-11/26/19 scheduled admission for Ommaya rservoir placement and chemotherapy, IT Cytarabine on 11/15/19.  Readmitted on 8/2-8/15/21 for Multiple Myeloma, fever chills, IV antibiotics for possible catheter related ventriculitis, acute toxic encephalopathy,Vitamin B12 deficiency,Pancytopenia.  Comorbidities include: Multiple Myeloma, Autologous stem cell transplant, Chemotherapy, Hypothyroidism. She was discharged to home on 12/10/19 without the need for home health services or DME.   Assessment:  Patient voices good understanding of all discharge instructions.  See transition of care flowsheet for assessment details.   Plan:  Reviewed hospital discharge diagnosis of Multiple Myeloma   and discharge treatment plan using hospital discharge instructions, assessing medication adherence, reviewing problems requiring provider notification, and  discussing the importance of follow up with surgeon, primary care provider and/or specialists as directed.  Reviewed [place] healthy lifestyle program information to receive discounted premium for  2022   Step 1: Get  your annual physical  Step 2: Complete your health assessment  Step 3:Identify your current health status and complete the corresponding action step between January 1, and December 27, 2019.    No ongoing care management needs identified so will close case to [facility] Management services and route successful outreach letter with [place] Management pamphlet and 24 Hour Nurse Line Magnet to [facility] Management clinical pool to be mailed to patient's home address.    Joylene Draft, RN, BSN  [facility] Management Coordinator  [phone]- Mobile [phone]- Toll Free Main Office

## 2019-12-13 ENCOUNTER — Ambulatory Visit
Admission: RE | Admit: 2019-12-13 | Discharge: 2019-12-13 | Disposition: A | Discharge status: Home / Self Care | Payer: 59 | Source: Ambulatory Visit | Attending: Urology | Admitting: Urology

## 2019-12-13 ENCOUNTER — Other Ambulatory Visit: Payer: Self-pay

## 2019-12-13 DIAGNOSIS — C9002 Multiple myeloma in relapse: Secondary | ICD-10-CM

## 2019-12-13 NOTE — Progress Notes (Signed)
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 Farris/[facility])  03/27/19-04/07/19:  1. Left scapula andC7 - T2/ 20 Gy in 10 fractions 2. Right hip / 20 Gy in 10 fractions  10/2008: Palliative radiotherapy to T1-T7 [name])  Narrative:  I spoke with the patient to conduct her routine scheduled 1 month follow up visit via telephone to spare the patient unnecessary potential exposure in the healthcare setting during the current COVID-19 pandemic.  The patient was notified in advance and gave permission to proceed with this visit format.    She tolerated radiation treatment relatively well.   She experienced mild fatigue and nausea but denied vomiting, changes in auditory acuity or imbalance. She did note improved vision within the first week of treatment and continued improvement throughout treatment.                              On review of systems, the patient states that she is doing well in general.  She is no longer having any double vision which she is quite pleased with.  Regarding her recent radiation treatment, she feels terrific.Unfortunately, since we last saw her, she had to be readmitted to [facility] on 11/27/2019 with fever and chills and neutropenia status post ommaya reservoir placement and cycle 1 of  VDCEP chemotherapy just prior.  She was treated with a course of IV antibiotics and her neutropenia  recovered and fever resolved.  She was deemed stable for discharge home on 12/10/2019.  Dr. Marcell Anger is making some adjustments to the systemic therapy dosages and she will go back in on Monday, 12/18/2019 for her next course of dose reduced VDCEP.  She denies any headaches, changes in auditory or visual acuity, imbalance, nausea, vomiting, diarrhea or significant fatigue.  Overall, she is pleased with her progress to date.   ALLERGIES:  is allergic to hydromorphone hcl, poison ivy extract [poison ivy extract], and tape.  Meds: Current Outpatient Medications  Medication Sig Dispense Refill  . acetaminophen (TYLENOL) 500 MG tablet Take 1 to 2   tablet(s) by mouth only as needed for pain *OCCASIONAL use only    . acyclovir (ZOVIRAX) 400 MG tablet Take 1 tablet (400 mg total) by mouth 2 (two) times daily. 60 tablet 3  . Calcium Carbonate-Vitamin D (CALCIUM + D PO) Take 1 tablet by mouth daily.    . Cholecalciferol (VITAMIN D) 2000 UNITS tablet Take 2,000 Units by mouth daily.    . hydrocortisone-pramoxine (ANALPRAM-HC) 2.5-1 % rectal cream Place 1 application rectally 3 (three) times daily. 30 g 1  . levothyroxine (SYNTHROID) 75 MCG tablet Take 1 tablet (75 mcg total) by mouth daily. 90 tablet 1  . omeprazole (PRILOSEC) 20 MG capsule Take 20 mg by mouth daily.    Marland Kitchen oxyCODONE-acetaminophen (PERCOCET) 10-325 MG tablet Take 1 tablet by mouth every 8 (  eight) hours as needed for pain. 30 tablet 0  . prochlorperazine (COMPAZINE) 10 MG tablet Take 1 tablet (10 mg total) by mouth every 6 (six) hours as needed for nausea or vomiting. 45 tablet 1  . senna-docusate (SENOKOT-S) 8.6-50 MG tablet Take by mouth.     . sulfamethoxazole-trimethoprim (BACTRIM DS) 800-160 MG tablet Take by mouth.    . traMADol (ULTRAM) 50 MG tablet Take 50 mg by mouth every 8 (eight) hours as needed.     Marland Kitchen dexamethasone (DECADRON) 4 MG tablet Take 10 tablets (40 mg total) by mouth once a week. (Patient not taking: Reported on 12/12/2019) 40  tablet 2   No current facility-administered medications for this encounter.   Facility-Administered Medications Ordered in Other Encounters  Medication Dose Route Frequency Provider Last Rate Last Admin  . 0.9 %  sodium chloride infusion   Intravenous Continuous Baird Cancer, PA-C 20 mL/hr at 12/05/10 0947 New Bag at 12/05/10 0947  . 0.9 %  sodium chloride infusion   Intravenous Continuous Everardo All, MD 20 mL/hr at 02/26/11 1528 New Bag at 02/26/11 1528  . sodium chloride 0.9 % injection 10 mL  10 mL Intravenous PRN Everardo All, MD   10 mL at 02/26/11 1518    Physical Findings:  vitals were not taken for this visit.   Karen Kays to assess due to telephone follow-up visit format.  Lab Findings: Lab Results  Component Value Date   WBC 2.5 (L) 12/12/2019   HGB 9.5 (L) 12/12/2019   HCT 29.0 (L) 12/12/2019   MCV 102.1 (H) 12/12/2019   PLT 23 (LL) 12/12/2019     Radiographic Findings: No results found.  Impression/Plan: 1. 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 She appears to have recovered well from the effects of her recent radiotherapy with resolution of the diplopia and is currently without complaints.  We discussed that while we are happy to continue to participate in her care if clinically indicated, at this point, we will plan to follow her progress via correspondence and are happy to see her back on an as-needed basis.  She will continue in routine follow-up under the direction of Dr. Marcell Anger and her neurosurgeon at [facility].  She knows that she is welcome to call at anytime with any questions or concerns related to her previous radiotherapy.  We look forward to continuing to follow her progress via correspondence.     Nicholos Johns, PA-C

## 2019-12-13 NOTE — Progress Notes (Signed)
  Radiation Oncology         [phone] ________________________________  Name: Melissa [name] MRN: [id number]  Date: 11/07/2019  DOB: 1955-06-27  End of Treatment Note  Diagnosis:   64 y.o. 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  Beams/energy:   A 3D field arrangement with DVHs of bilateral lens, bilateral eyes, brainstem, and bilateral cochlea was set up with DCA and static gantry angles zero and 180 using 10X MV X-rays  Narrative: The patient tolerated radiation treatment relatively well.   She experienced mild fatigue and nausea but denied vomiting, changes in auditory acuity or imbalance. She did note improved vision within the first week of treatment and continued improvement throughout treatment.  Plan: The patient has completed radiation treatment. She will return to radiation oncology clinic for routine followup in one month. I advised her to call or return sooner if she has any questions or concerns related to her recovery or treatment. ________________________________  Sheral Apley. [name], M.D.

## 2019-12-14 DIAGNOSIS — R748 Abnormal levels of other serum enzymes: Secondary | ICD-10-CM | POA: Diagnosis not present

## 2019-12-14 DIAGNOSIS — M5489 Other dorsalgia: Secondary | ICD-10-CM | POA: Diagnosis not present

## 2019-12-14 DIAGNOSIS — Z9484 Stem cells transplant status: Secondary | ICD-10-CM | POA: Diagnosis not present

## 2019-12-14 DIAGNOSIS — Z9889 Other specified postprocedural states: Secondary | ICD-10-CM | POA: Diagnosis not present

## 2019-12-14 DIAGNOSIS — Z7983 Long term (current) use of bisphosphonates: Secondary | ICD-10-CM | POA: Diagnosis not present

## 2019-12-14 DIAGNOSIS — C9002 Multiple myeloma in relapse: Secondary | ICD-10-CM | POA: Diagnosis not present

## 2019-12-14 DIAGNOSIS — D61818 Other pancytopenia: Secondary | ICD-10-CM | POA: Diagnosis not present

## 2019-12-18 DIAGNOSIS — G9340 Encephalopathy, unspecified: Secondary | ICD-10-CM | POA: Diagnosis not present

## 2019-12-18 DIAGNOSIS — C9000 Multiple myeloma not having achieved remission: Secondary | ICD-10-CM | POA: Diagnosis not present

## 2019-12-18 DIAGNOSIS — Z7983 Long term (current) use of bisphosphonates: Secondary | ICD-10-CM | POA: Diagnosis not present

## 2019-12-18 DIAGNOSIS — M546 Pain in thoracic spine: Secondary | ICD-10-CM | POA: Diagnosis not present

## 2019-12-18 DIAGNOSIS — Z79899 Other long term (current) drug therapy: Secondary | ICD-10-CM | POA: Diagnosis not present

## 2019-12-18 DIAGNOSIS — M545 Low back pain: Secondary | ICD-10-CM | POA: Diagnosis not present

## 2019-12-18 DIAGNOSIS — C9030 Solitary plasmacytoma not having achieved remission: Secondary | ICD-10-CM | POA: Diagnosis not present

## 2019-12-18 DIAGNOSIS — D61818 Other pancytopenia: Secondary | ICD-10-CM | POA: Diagnosis not present

## 2019-12-18 DIAGNOSIS — C9002 Multiple myeloma in relapse: Secondary | ICD-10-CM | POA: Diagnosis not present

## 2019-12-25 DIAGNOSIS — E039 Hypothyroidism, unspecified: Secondary | ICD-10-CM | POA: Diagnosis not present

## 2019-12-25 DIAGNOSIS — R251 Tremor, unspecified: Secondary | ICD-10-CM | POA: Diagnosis not present

## 2019-12-25 DIAGNOSIS — E538 Deficiency of other specified B group vitamins: Secondary | ICD-10-CM | POA: Diagnosis not present

## 2019-12-25 DIAGNOSIS — M545 Low back pain: Secondary | ICD-10-CM | POA: Diagnosis not present

## 2019-12-25 DIAGNOSIS — Z5111 Encounter for antineoplastic chemotherapy: Secondary | ICD-10-CM | POA: Diagnosis not present

## 2019-12-25 DIAGNOSIS — G8929 Other chronic pain: Secondary | ICD-10-CM | POA: Diagnosis not present

## 2019-12-25 DIAGNOSIS — C9002 Multiple myeloma in relapse: Secondary | ICD-10-CM | POA: Diagnosis not present

## 2019-12-25 DIAGNOSIS — D61810 Antineoplastic chemotherapy induced pancytopenia: Secondary | ICD-10-CM | POA: Diagnosis not present

## 2019-12-25 DIAGNOSIS — R0989 Other specified symptoms and signs involving the circulatory and respiratory systems: Secondary | ICD-10-CM | POA: Diagnosis not present

## 2019-12-25 DIAGNOSIS — R000 Tachycardia, unspecified: Secondary | ICD-10-CM | POA: Diagnosis not present

## 2019-12-25 DIAGNOSIS — Z9484 Stem cells transplant status: Secondary | ICD-10-CM | POA: Diagnosis not present

## 2019-12-25 DIAGNOSIS — G4700 Insomnia, unspecified: Secondary | ICD-10-CM | POA: Diagnosis not present

## 2019-12-25 DIAGNOSIS — C9000 Multiple myeloma not having achieved remission: Secondary | ICD-10-CM | POA: Diagnosis not present

## 2019-12-26 ENCOUNTER — Other Ambulatory Visit: Payer: Self-pay | Admitting: *Deleted

## 2019-12-26 NOTE — Patient Outreach (Signed)
[facility]) Care Management  12/26/2019  Melissa [name] February 15, 1956 [id number]     Transition of care telephone call  Referral received:12/18/19 Initial outreach:12/26/19 Insurance: UMR    Subjective: Outreach call to patient, explained reason for the call,  transition of care outreach. Patient reports that she currently  inpatient at [facility] for planned admission for Chemotherapy. Offered apology  for outreach call on today.   Plan Will follow for discharge disposition for transition of care outreach.    Joylene Draft, RN, BSN  [place] Management Coordinator  [phone]- Mobile [phone]- Toll Free Main Office

## 2019-12-30 DIAGNOSIS — C9202 Acute myeloblastic leukemia, in relapse: Secondary | ICD-10-CM | POA: Diagnosis not present

## 2019-12-30 DIAGNOSIS — Z7689 Persons encountering health services in other specified circumstances: Secondary | ICD-10-CM | POA: Diagnosis not present

## 2020-01-02 ENCOUNTER — Other Ambulatory Visit: Payer: Self-pay | Admitting: *Deleted

## 2020-01-02 DIAGNOSIS — Z982 Presence of cerebrospinal fluid drainage device: Secondary | ICD-10-CM | POA: Diagnosis not present

## 2020-01-02 DIAGNOSIS — Z9889 Other specified postprocedural states: Secondary | ICD-10-CM | POA: Diagnosis not present

## 2020-01-02 DIAGNOSIS — C9002 Multiple myeloma in relapse: Secondary | ICD-10-CM | POA: Diagnosis not present

## 2020-01-02 DIAGNOSIS — C9000 Multiple myeloma not having achieved remission: Secondary | ICD-10-CM | POA: Diagnosis not present

## 2020-01-02 DIAGNOSIS — Z48811 Encounter for surgical aftercare following surgery on the nervous system: Secondary | ICD-10-CM | POA: Diagnosis not present

## 2020-01-02 DIAGNOSIS — C9030 Solitary plasmacytoma not having achieved remission: Secondary | ICD-10-CM | POA: Diagnosis not present

## 2020-01-02 NOTE — Patient Outreach (Signed)
[facility] ([facility]) Care Management  01/02/2020  Melissa [name] 10/20/1955 [id number]   Transition of care telephone call  Referral received: 12/25/19 Initial outreach:01/02/20 Insurance: [place] UMR   Initial unsuccessful telephone call to patient's preferred number in order to complete transition of care assessment; patient answering phone stating this is not a good time she is currently at medical appointment at [facility] and request return call on tomorrow.   Objective: Per the electronic medical record, Melissa [name] was hospitalized at [facility] on 8/30-12/29/19 for scheduled Chemotherapy  for Multiple Myeloma, Past Medical history includes Multiple Myeloma, Pancytopenia, Autologous stem cell transplant, chemotherapy, hypothyroidism, placement of Ommaya placement.   He/She was discharged to home on /21 without the need for home health services or durable medical equipment per the discharge summary.   Plan: This RNCM will route unsuccessful outreach letter to patient and return call in the next 2 business days as requested.   Melissa Glover, RN, BSN  [facility] Care Management,Care Management Coordinator  [phone]- Mobile [phone]- Toll Free Main Office   

## 2020-01-03 ENCOUNTER — Other Ambulatory Visit: Payer: Self-pay | Admitting: *Deleted

## 2020-01-03 ENCOUNTER — Encounter: Payer: Self-pay | Admitting: *Deleted

## 2020-01-03 NOTE — Patient Outreach (Signed)
[facility]) Care Management  01/03/2020  Melissa [name] [birth year] [id number]   Transition of care call/case closure   Referral received:12/25/19 Initial outreach:01/02/20 Insurance: UMR    Subjective: 2nd attempt  successful telephone call to patient's preferred number in order to complete transition of care assessment; 2 HIPAA identifiers verified. Explained purpose of call and completed transition of care assessment.  Melissa [name] states that she is doing well. She discussed recent planned  hospital stay for chemotherapy. She discussed she had some initial shortness of breath, she denies at present, she denies having fever dizziness. She discussed some issues with her blood pressure and has been started on coreg. She reports monitoring blood pressures reading in normal range, recent 110/62 encouraged to notify MD on concerns if lower reading, dizziness. She reports recent labs on yesterday relieved counts okay and she did not have to received blood products. She reports tolerating diet fairly well denies bowel or bladder problems.  Spouse is  assisting with her  recovery.   She does  not have the hospital indemnity, she is currently on extended leave plan.  She  uses a [facility] outpatient pharmacy at [facility].     Objective:  Melissa [name] was hospitalized at [facility] on 8/30-12/29/19 for scheduled Chemotherapy  for Multiple Myeloma, Past Medical history includes Multiple Myeloma, Pancytopenia, Autologous stem cell transplant, chemotherapy, hypothyroidism, placement of Ommaya placement.   She was discharged to home on 12/29/19  without the need for home health services or durable medical equipment per the discharge summary.   Assessment:  Patient voices good understanding of all discharge instructions.  See transition of care flowsheet for assessment details.   Plan:  Reviewed hospital discharge diagnosis of Multiple Myeloma, Chemotherapy   and  discharge treatment plan using hospital discharge instructions, assessing medication adherence, reviewing problems requiring provider notification, and discussing the importance of follow up with  primary care provider and/or specialists as directed.  Reviewed [place] healthy lifestyle program information to receive discounted premium for  2022   Step 1: Get  your annual physical  Step 2: Complete your health assessment  Step 3:Identify your current health status and complete the corresponding action step between January 1, and December 27, 2019.    No ongoing care management needs identified so will close case to [place] Management services. Unsuccessful outreach letter was routed to patient at initial call attempt .   Joylene Draft, RN, BSN  [place] Management Coordinator  [phone]- Mobile [phone]- Toll Free Main Office

## 2020-01-05 DIAGNOSIS — I951 Orthostatic hypotension: Secondary | ICD-10-CM | POA: Diagnosis not present

## 2020-01-05 DIAGNOSIS — R000 Tachycardia, unspecified: Secondary | ICD-10-CM | POA: Diagnosis not present

## 2020-01-05 DIAGNOSIS — M546 Pain in thoracic spine: Secondary | ICD-10-CM | POA: Diagnosis not present

## 2020-01-05 DIAGNOSIS — R509 Fever, unspecified: Secondary | ICD-10-CM | POA: Diagnosis not present

## 2020-01-05 DIAGNOSIS — D709 Neutropenia, unspecified: Secondary | ICD-10-CM | POA: Diagnosis not present

## 2020-01-05 DIAGNOSIS — C9000 Multiple myeloma not having achieved remission: Secondary | ICD-10-CM | POA: Diagnosis not present

## 2020-01-05 DIAGNOSIS — D61818 Other pancytopenia: Secondary | ICD-10-CM | POA: Diagnosis not present

## 2020-01-05 DIAGNOSIS — G92 Toxic encephalopathy: Secondary | ICD-10-CM | POA: Diagnosis not present

## 2020-01-08 MED FILL — ACYCLOVIR 400 MG TABLET: 400 | 30 days supply | Qty: 60 | Fill #3

## 2020-01-09 DIAGNOSIS — C9002 Multiple myeloma in relapse: Secondary | ICD-10-CM | POA: Diagnosis not present

## 2020-01-09 DIAGNOSIS — C9000 Multiple myeloma not having achieved remission: Secondary | ICD-10-CM | POA: Diagnosis not present

## 2020-01-12 DIAGNOSIS — C9000 Multiple myeloma not having achieved remission: Secondary | ICD-10-CM | POA: Diagnosis not present

## 2020-01-12 DIAGNOSIS — D61818 Other pancytopenia: Secondary | ICD-10-CM | POA: Diagnosis not present

## 2020-01-12 MED FILL — DEXAMETHASONE 4 MG TABLET: 4 | 5 days supply | Qty: 5 | Fill #0

## 2020-01-12 MED FILL — SULFAMETHOXAZOLE-TMP DS TAB: 800-160 | 35 days supply | Qty: 15 | Fill #0

## 2020-01-15 DIAGNOSIS — Z9484 Stem cells transplant status: Secondary | ICD-10-CM | POA: Diagnosis not present

## 2020-01-15 DIAGNOSIS — C9002 Multiple myeloma in relapse: Secondary | ICD-10-CM | POA: Diagnosis not present

## 2020-01-15 DIAGNOSIS — D61818 Other pancytopenia: Secondary | ICD-10-CM | POA: Diagnosis not present

## 2020-01-19 ENCOUNTER — Ambulatory Visit: Payer: 59 | Admitting: Gastroenterology

## 2020-01-29 DIAGNOSIS — K5730 Diverticulosis of large intestine without perforation or abscess without bleeding: Secondary | ICD-10-CM | POA: Diagnosis not present

## 2020-01-29 DIAGNOSIS — I700 Atherosclerosis of aorta: Secondary | ICD-10-CM | POA: Diagnosis not present

## 2020-01-29 DIAGNOSIS — K449 Diaphragmatic hernia without obstruction or gangrene: Secondary | ICD-10-CM | POA: Diagnosis not present

## 2020-01-29 DIAGNOSIS — K8020 Calculus of gallbladder without cholecystitis without obstruction: Secondary | ICD-10-CM | POA: Diagnosis not present

## 2020-01-29 DIAGNOSIS — C9000 Multiple myeloma not having achieved remission: Secondary | ICD-10-CM | POA: Diagnosis not present

## 2020-01-29 DIAGNOSIS — R918 Other nonspecific abnormal finding of lung field: Secondary | ICD-10-CM | POA: Diagnosis not present

## 2020-01-29 DIAGNOSIS — Z01818 Encounter for other preprocedural examination: Secondary | ICD-10-CM | POA: Diagnosis not present

## 2020-01-29 DIAGNOSIS — I2510 Atherosclerotic heart disease of native coronary artery without angina pectoris: Secondary | ICD-10-CM | POA: Diagnosis not present

## 2020-01-29 DIAGNOSIS — G9589 Other specified diseases of spinal cord: Secondary | ICD-10-CM | POA: Diagnosis not present

## 2020-01-30 DIAGNOSIS — C9002 Multiple myeloma in relapse: Secondary | ICD-10-CM | POA: Diagnosis not present

## 2020-01-30 DIAGNOSIS — Z9484 Stem cells transplant status: Secondary | ICD-10-CM | POA: Diagnosis not present

## 2020-01-30 DIAGNOSIS — C9000 Multiple myeloma not having achieved remission: Secondary | ICD-10-CM | POA: Diagnosis not present

## 2020-01-30 DIAGNOSIS — Z01818 Encounter for other preprocedural examination: Secondary | ICD-10-CM | POA: Diagnosis not present

## 2020-02-05 DIAGNOSIS — Z01818 Encounter for other preprocedural examination: Secondary | ICD-10-CM | POA: Diagnosis not present

## 2020-02-05 DIAGNOSIS — C9030 Solitary plasmacytoma not having achieved remission: Secondary | ICD-10-CM | POA: Diagnosis not present

## 2020-02-05 DIAGNOSIS — Z79899 Other long term (current) drug therapy: Secondary | ICD-10-CM | POA: Diagnosis not present

## 2020-02-05 DIAGNOSIS — C9000 Multiple myeloma not having achieved remission: Secondary | ICD-10-CM | POA: Diagnosis not present

## 2020-02-05 DIAGNOSIS — M546 Pain in thoracic spine: Secondary | ICD-10-CM | POA: Diagnosis not present

## 2020-02-05 DIAGNOSIS — D649 Anemia, unspecified: Secondary | ICD-10-CM | POA: Diagnosis not present

## 2020-02-05 DIAGNOSIS — M545 Low back pain, unspecified: Secondary | ICD-10-CM | POA: Diagnosis not present

## 2020-02-05 DIAGNOSIS — Z7983 Long term (current) use of bisphosphonates: Secondary | ICD-10-CM | POA: Diagnosis not present

## 2020-02-05 DIAGNOSIS — M5489 Other dorsalgia: Secondary | ICD-10-CM | POA: Diagnosis not present

## 2020-02-05 DIAGNOSIS — Z9484 Stem cells transplant status: Secondary | ICD-10-CM | POA: Diagnosis not present

## 2020-02-05 DIAGNOSIS — D72819 Decreased white blood cell count, unspecified: Secondary | ICD-10-CM | POA: Diagnosis not present

## 2020-02-05 MED FILL — ONDANSETRON HCL 8 MG TABLET: 8 | 6 days supply | Qty: 20 | Fill #2

## 2020-02-05 MED FILL — LEVOTHYROXINE 75 MCG TABLET: 75 | 90 days supply | Qty: 90 | Fill #1

## 2020-02-05 MED FILL — PANTOPRAZOLE SOD DR 40 MG T: 40 | 90 days supply | Qty: 90 | Fill #2

## 2020-02-05 MED FILL — ACYCLOVIR 400 MG TABLET: 400 | 30 days supply | Qty: 60 | Fill #4

## 2020-02-06 DIAGNOSIS — C9000 Multiple myeloma not having achieved remission: Secondary | ICD-10-CM | POA: Diagnosis not present

## 2020-02-06 DIAGNOSIS — R942 Abnormal results of pulmonary function studies: Secondary | ICD-10-CM | POA: Diagnosis not present

## 2020-02-07 ENCOUNTER — Other Ambulatory Visit (HOSPITAL_COMMUNITY): Payer: Self-pay

## 2020-02-09 DIAGNOSIS — Z20822 Contact with and (suspected) exposure to covid-19: Secondary | ICD-10-CM | POA: Diagnosis not present

## 2020-02-09 DIAGNOSIS — Z01812 Encounter for preprocedural laboratory examination: Secondary | ICD-10-CM | POA: Diagnosis not present

## 2020-02-09 DIAGNOSIS — C9000 Multiple myeloma not having achieved remission: Secondary | ICD-10-CM | POA: Diagnosis not present

## 2020-02-12 DIAGNOSIS — Z7952 Long term (current) use of systemic steroids: Secondary | ICD-10-CM | POA: Diagnosis not present

## 2020-02-12 DIAGNOSIS — Z885 Allergy status to narcotic agent status: Secondary | ICD-10-CM | POA: Diagnosis not present

## 2020-02-12 DIAGNOSIS — C9002 Multiple myeloma in relapse: Secondary | ICD-10-CM | POA: Diagnosis not present

## 2020-02-12 DIAGNOSIS — R110 Nausea: Secondary | ICD-10-CM | POA: Diagnosis not present

## 2020-02-12 DIAGNOSIS — C9000 Multiple myeloma not having achieved remission: Secondary | ICD-10-CM | POA: Diagnosis not present

## 2020-02-12 DIAGNOSIS — T451X5D Adverse effect of antineoplastic and immunosuppressive drugs, subsequent encounter: Secondary | ICD-10-CM | POA: Diagnosis not present

## 2020-02-12 DIAGNOSIS — Z79899 Other long term (current) drug therapy: Secondary | ICD-10-CM | POA: Diagnosis not present

## 2020-02-12 DIAGNOSIS — Z9484 Stem cells transplant status: Secondary | ICD-10-CM | POA: Diagnosis not present

## 2020-02-12 DIAGNOSIS — D61810 Antineoplastic chemotherapy induced pancytopenia: Secondary | ICD-10-CM | POA: Diagnosis not present

## 2020-02-13 DIAGNOSIS — Z885 Allergy status to narcotic agent status: Secondary | ICD-10-CM | POA: Diagnosis not present

## 2020-02-13 DIAGNOSIS — Z9484 Stem cells transplant status: Secondary | ICD-10-CM | POA: Diagnosis not present

## 2020-02-13 DIAGNOSIS — T451X5D Adverse effect of antineoplastic and immunosuppressive drugs, subsequent encounter: Secondary | ICD-10-CM | POA: Diagnosis not present

## 2020-02-13 DIAGNOSIS — Z886 Allergy status to analgesic agent status: Secondary | ICD-10-CM | POA: Diagnosis not present

## 2020-02-13 DIAGNOSIS — C9002 Multiple myeloma in relapse: Secondary | ICD-10-CM | POA: Diagnosis not present

## 2020-02-13 DIAGNOSIS — Z7952 Long term (current) use of systemic steroids: Secondary | ICD-10-CM | POA: Diagnosis not present

## 2020-02-13 DIAGNOSIS — Z79899 Other long term (current) drug therapy: Secondary | ICD-10-CM | POA: Diagnosis not present

## 2020-02-13 DIAGNOSIS — D61810 Antineoplastic chemotherapy induced pancytopenia: Secondary | ICD-10-CM | POA: Diagnosis not present

## 2020-02-14 DIAGNOSIS — C9002 Multiple myeloma in relapse: Secondary | ICD-10-CM | POA: Diagnosis not present

## 2020-02-14 DIAGNOSIS — R739 Hyperglycemia, unspecified: Secondary | ICD-10-CM | POA: Diagnosis not present

## 2020-02-14 DIAGNOSIS — E876 Hypokalemia: Secondary | ICD-10-CM | POA: Diagnosis not present

## 2020-02-15 DIAGNOSIS — Z95828 Presence of other vascular implants and grafts: Secondary | ICD-10-CM | POA: Diagnosis not present

## 2020-02-15 DIAGNOSIS — D61810 Antineoplastic chemotherapy induced pancytopenia: Secondary | ICD-10-CM | POA: Diagnosis not present

## 2020-02-15 DIAGNOSIS — Z885 Allergy status to narcotic agent status: Secondary | ICD-10-CM | POA: Diagnosis not present

## 2020-02-15 DIAGNOSIS — R682 Dry mouth, unspecified: Secondary | ICD-10-CM | POA: Diagnosis not present

## 2020-02-15 DIAGNOSIS — C9002 Multiple myeloma in relapse: Secondary | ICD-10-CM | POA: Diagnosis not present

## 2020-02-15 DIAGNOSIS — Z79899 Other long term (current) drug therapy: Secondary | ICD-10-CM | POA: Diagnosis not present

## 2020-02-15 DIAGNOSIS — Z9484 Stem cells transplant status: Secondary | ICD-10-CM | POA: Diagnosis not present

## 2020-02-15 DIAGNOSIS — T451X5S Adverse effect of antineoplastic and immunosuppressive drugs, sequela: Secondary | ICD-10-CM | POA: Diagnosis not present

## 2020-02-16 DIAGNOSIS — R600 Localized edema: Secondary | ICD-10-CM | POA: Diagnosis not present

## 2020-02-16 DIAGNOSIS — C9002 Multiple myeloma in relapse: Secondary | ICD-10-CM | POA: Diagnosis not present

## 2020-02-16 DIAGNOSIS — Z95828 Presence of other vascular implants and grafts: Secondary | ICD-10-CM | POA: Diagnosis not present

## 2020-02-16 DIAGNOSIS — Z9484 Stem cells transplant status: Secondary | ICD-10-CM | POA: Diagnosis not present

## 2020-02-16 DIAGNOSIS — D61810 Antineoplastic chemotherapy induced pancytopenia: Secondary | ICD-10-CM | POA: Diagnosis not present

## 2020-02-16 DIAGNOSIS — Z79899 Other long term (current) drug therapy: Secondary | ICD-10-CM | POA: Diagnosis not present

## 2020-02-16 DIAGNOSIS — T451X5S Adverse effect of antineoplastic and immunosuppressive drugs, sequela: Secondary | ICD-10-CM | POA: Diagnosis not present

## 2020-02-16 DIAGNOSIS — R770 Abnormality of albumin: Secondary | ICD-10-CM | POA: Diagnosis not present

## 2020-02-16 DIAGNOSIS — Z885 Allergy status to narcotic agent status: Secondary | ICD-10-CM | POA: Diagnosis not present

## 2020-02-17 DIAGNOSIS — Z885 Allergy status to narcotic agent status: Secondary | ICD-10-CM | POA: Diagnosis not present

## 2020-02-17 DIAGNOSIS — C9002 Multiple myeloma in relapse: Secondary | ICD-10-CM | POA: Diagnosis not present

## 2020-02-17 DIAGNOSIS — T451X5S Adverse effect of antineoplastic and immunosuppressive drugs, sequela: Secondary | ICD-10-CM | POA: Diagnosis not present

## 2020-02-17 DIAGNOSIS — Z79899 Other long term (current) drug therapy: Secondary | ICD-10-CM | POA: Diagnosis not present

## 2020-02-17 DIAGNOSIS — Z9484 Stem cells transplant status: Secondary | ICD-10-CM | POA: Diagnosis not present

## 2020-02-17 DIAGNOSIS — R600 Localized edema: Secondary | ICD-10-CM | POA: Diagnosis not present

## 2020-02-17 DIAGNOSIS — D61810 Antineoplastic chemotherapy induced pancytopenia: Secondary | ICD-10-CM | POA: Diagnosis not present

## 2020-02-18 DIAGNOSIS — R770 Abnormality of albumin: Secondary | ICD-10-CM | POA: Diagnosis not present

## 2020-02-18 DIAGNOSIS — C9002 Multiple myeloma in relapse: Secondary | ICD-10-CM | POA: Diagnosis not present

## 2020-02-19 DIAGNOSIS — C9002 Multiple myeloma in relapse: Secondary | ICD-10-CM | POA: Diagnosis not present

## 2020-02-19 DIAGNOSIS — R110 Nausea: Secondary | ICD-10-CM | POA: Diagnosis not present

## 2020-02-19 DIAGNOSIS — R5383 Other fatigue: Secondary | ICD-10-CM | POA: Diagnosis not present

## 2020-02-20 DIAGNOSIS — Z885 Allergy status to narcotic agent status: Secondary | ICD-10-CM | POA: Diagnosis not present

## 2020-02-20 DIAGNOSIS — R0781 Pleurodynia: Secondary | ICD-10-CM | POA: Diagnosis not present

## 2020-02-20 DIAGNOSIS — R110 Nausea: Secondary | ICD-10-CM | POA: Diagnosis not present

## 2020-02-20 DIAGNOSIS — Z79899 Other long term (current) drug therapy: Secondary | ICD-10-CM | POA: Diagnosis not present

## 2020-02-20 DIAGNOSIS — R768 Other specified abnormal immunological findings in serum: Secondary | ICD-10-CM | POA: Diagnosis not present

## 2020-02-20 DIAGNOSIS — Z9484 Stem cells transplant status: Secondary | ICD-10-CM | POA: Diagnosis not present

## 2020-02-20 DIAGNOSIS — C9002 Multiple myeloma in relapse: Secondary | ICD-10-CM | POA: Diagnosis not present

## 2020-02-20 DIAGNOSIS — K1231 Oral mucositis (ulcerative) due to antineoplastic therapy: Secondary | ICD-10-CM | POA: Diagnosis not present

## 2020-02-20 DIAGNOSIS — D61810 Antineoplastic chemotherapy induced pancytopenia: Secondary | ICD-10-CM | POA: Diagnosis not present

## 2020-02-21 DIAGNOSIS — R600 Localized edema: Secondary | ICD-10-CM | POA: Diagnosis not present

## 2020-02-21 DIAGNOSIS — R0602 Shortness of breath: Secondary | ICD-10-CM | POA: Diagnosis not present

## 2020-02-21 DIAGNOSIS — Z79899 Other long term (current) drug therapy: Secondary | ICD-10-CM | POA: Diagnosis not present

## 2020-02-21 DIAGNOSIS — R0781 Pleurodynia: Secondary | ICD-10-CM | POA: Diagnosis not present

## 2020-02-21 DIAGNOSIS — D61810 Antineoplastic chemotherapy induced pancytopenia: Secondary | ICD-10-CM | POA: Diagnosis not present

## 2020-02-21 DIAGNOSIS — R002 Palpitations: Secondary | ICD-10-CM | POA: Diagnosis not present

## 2020-02-21 DIAGNOSIS — C9002 Multiple myeloma in relapse: Secondary | ICD-10-CM | POA: Diagnosis not present

## 2020-02-21 DIAGNOSIS — Z885 Allergy status to narcotic agent status: Secondary | ICD-10-CM | POA: Diagnosis not present

## 2020-02-21 DIAGNOSIS — K1231 Oral mucositis (ulcerative) due to antineoplastic therapy: Secondary | ICD-10-CM | POA: Diagnosis not present

## 2020-02-22 DIAGNOSIS — C9000 Multiple myeloma not having achieved remission: Secondary | ICD-10-CM | POA: Diagnosis not present

## 2020-02-22 DIAGNOSIS — T451X5A Adverse effect of antineoplastic and immunosuppressive drugs, initial encounter: Secondary | ICD-10-CM | POA: Diagnosis not present

## 2020-02-22 DIAGNOSIS — D61810 Antineoplastic chemotherapy induced pancytopenia: Secondary | ICD-10-CM | POA: Diagnosis not present

## 2020-02-22 DIAGNOSIS — R112 Nausea with vomiting, unspecified: Secondary | ICD-10-CM | POA: Diagnosis not present

## 2020-02-22 DIAGNOSIS — R197 Diarrhea, unspecified: Secondary | ICD-10-CM | POA: Diagnosis not present

## 2020-02-22 DIAGNOSIS — K1231 Oral mucositis (ulcerative) due to antineoplastic therapy: Secondary | ICD-10-CM | POA: Insufficient documentation

## 2020-02-22 DIAGNOSIS — R0781 Pleurodynia: Secondary | ICD-10-CM | POA: Diagnosis not present

## 2020-02-22 DIAGNOSIS — K521 Toxic gastroenteritis and colitis: Secondary | ICD-10-CM | POA: Diagnosis not present

## 2020-02-22 DIAGNOSIS — R918 Other nonspecific abnormal finding of lung field: Secondary | ICD-10-CM | POA: Diagnosis not present

## 2020-02-22 DIAGNOSIS — R5383 Other fatigue: Secondary | ICD-10-CM | POA: Diagnosis not present

## 2020-02-22 DIAGNOSIS — C9002 Multiple myeloma in relapse: Secondary | ICD-10-CM | POA: Diagnosis not present

## 2020-02-22 DIAGNOSIS — C9001 Multiple myeloma in remission: Secondary | ICD-10-CM | POA: Diagnosis not present

## 2020-02-22 DIAGNOSIS — Z9484 Stem cells transplant status: Secondary | ICD-10-CM | POA: Diagnosis not present

## 2020-02-22 DIAGNOSIS — R600 Localized edema: Secondary | ICD-10-CM | POA: Diagnosis not present

## 2020-02-23 DIAGNOSIS — D61810 Antineoplastic chemotherapy induced pancytopenia: Secondary | ICD-10-CM | POA: Diagnosis not present

## 2020-02-23 DIAGNOSIS — K521 Toxic gastroenteritis and colitis: Secondary | ICD-10-CM | POA: Diagnosis not present

## 2020-02-23 DIAGNOSIS — K1231 Oral mucositis (ulcerative) due to antineoplastic therapy: Secondary | ICD-10-CM | POA: Diagnosis not present

## 2020-02-24 DIAGNOSIS — Z9484 Stem cells transplant status: Secondary | ICD-10-CM | POA: Diagnosis not present

## 2020-02-24 DIAGNOSIS — C9000 Multiple myeloma not having achieved remission: Secondary | ICD-10-CM | POA: Diagnosis not present

## 2020-02-24 DIAGNOSIS — K521 Toxic gastroenteritis and colitis: Secondary | ICD-10-CM | POA: Diagnosis not present

## 2020-02-24 DIAGNOSIS — D61810 Antineoplastic chemotherapy induced pancytopenia: Secondary | ICD-10-CM | POA: Diagnosis not present

## 2020-02-24 DIAGNOSIS — K1231 Oral mucositis (ulcerative) due to antineoplastic therapy: Secondary | ICD-10-CM | POA: Diagnosis not present

## 2020-02-24 DIAGNOSIS — T451X5A Adverse effect of antineoplastic and immunosuppressive drugs, initial encounter: Secondary | ICD-10-CM | POA: Diagnosis not present

## 2020-02-29 ENCOUNTER — Encounter: Payer: Self-pay | Admitting: *Deleted

## 2020-02-29 ENCOUNTER — Other Ambulatory Visit: Payer: Self-pay | Admitting: *Deleted

## 2020-02-29 NOTE — Patient Outreach (Signed)
Melissa [name] [facility]) Care Management  02/29/2020  Melissa [name] Aug 09, 1955 [id number]   Transition of care call/case closure   Referral received: 02/29/20 Initial outreach:02/29/20 Insurance: UMR    Subjective: Initial successful telephone call to patient's preferred number in order to complete transition of care assessment; 2 HIPAA identifiers verified. Explained purpose of call and completed transition of care assessment.  Melissa [name] states she is feeling much better. She discussed pain in oral cavity is subsiding with prn medication and treatments with mouth washes and antibiotics. She reports tolerating soft , mushy foods for diet. She reports continues to have diarrhea at least once a day , and taking immodium prn. Reinforced staying hydrated . She denies having fever, and reinforced continued use of incentive spirometry. She continues still feeling weak but improved, tolerating mobility in home .Spouse is  assisting with her recovery.  Patient declines need for educational or resource information.   She does not have the hospital indemnity plan ,discussed being on extended leave plan.  She uses a  [facility] at [facility].     Objective:  Melissa [name]  was hospitalized at [facility] 10/28-11/1/21 for Oral Mucositis pain management ,  outpatient Autologous stem cell transplant completed for Multiple Myeloma. She was discharged to home on 11/1/21without the need for home health services or DME.   Assessment:  Patient voices good understanding of all discharge instructions.  See transition of care flowsheet for assessment details.   Plan:  Reviewed hospital discharge diagnosis of Oral mucositis   and discharge treatment plan using hospital discharge instructions, assessing medication adherence, reviewing problems requiring provider notification, and discussing the importance of follow up with surgeon, primary care provider  and/or specialists as directed.  No ongoing care management needs identified so will close case to [place] Management services and route successful outreach letter with [place] Management pamphlet and 24 Hour Nurse Line Magnet to [place] Management clinical pool to be mailed to patient's home address.   Melissa Draft, RN, BSN  [place] Management Coordinator  [phone]- Mobile [phone]- Toll Free Main Office

## 2020-03-02 ENCOUNTER — Other Ambulatory Visit (HOSPITAL_COMMUNITY)
Admission: RE | Admit: 2020-03-02 | Discharge: 2020-03-02 | Disposition: A | Discharge status: Home / Self Care | Payer: 59 | Source: Ambulatory Visit | Attending: Physician Assistant | Admitting: Physician Assistant

## 2020-03-02 DIAGNOSIS — C9000 Multiple myeloma not having achieved remission: Secondary | ICD-10-CM | POA: Diagnosis not present

## 2020-03-02 LAB — CBC WITH DIFFERENTIAL/PLATELET
Abs Immature Granulocytes: 0.57 10*3/uL — ABNORMAL HIGH (ref 0.00–0.07)
Basophils Absolute: 0.1 10*3/uL (ref 0.0–0.1)
Basophils Relative: 1 %
Eosinophils Absolute: 0 10*3/uL (ref 0.0–0.5)
Eosinophils Relative: 0 %
HCT: 33.8 % — ABNORMAL LOW (ref 36.0–46.0)
Hemoglobin: 10.7 g/dL — ABNORMAL LOW (ref 12.0–15.0)
Immature Granulocytes: 12 %
Lymphocytes Relative: 4 %
Lymphs Abs: 0.2 10*3/uL — ABNORMAL LOW (ref 0.7–4.0)
MCH: 34.3 pg — ABNORMAL HIGH (ref 26.0–34.0)
MCHC: 31.7 g/dL (ref 30.0–36.0)
MCV: 108.3 fL — ABNORMAL HIGH (ref 80.0–100.0)
Monocytes Absolute: 1.2 10*3/uL — ABNORMAL HIGH (ref 0.1–1.0)
Monocytes Relative: 25 %
Neutro Abs: 2.9 10*3/uL (ref 1.7–7.7)
Neutrophils Relative %: 58 %
Platelets: 114 10*3/uL — ABNORMAL LOW (ref 150–400)
RBC: 3.12 MIL/uL — ABNORMAL LOW (ref 3.87–5.11)
RDW: 19.6 % — ABNORMAL HIGH (ref 11.5–15.5)
WBC Morphology: INCREASED
WBC: 4.9 10*3/uL (ref 4.0–10.5)
nRBC: 0.6 % — ABNORMAL HIGH (ref 0.0–0.2)

## 2020-03-02 LAB — COMPREHENSIVE METABOLIC PANEL
ALT: 16 U/L (ref 0–44)
AST: 28 U/L (ref 15–41)
Albumin: 3.7 g/dL (ref 3.5–5.0)
Alkaline Phosphatase: 72 U/L (ref 38–126)
Anion gap: 9 (ref 5–15)
BUN: 10 mg/dL (ref 8–23)
CO2: 27 mmol/L (ref 22–32)
Calcium: 9.6 mg/dL (ref 8.9–10.3)
Chloride: 103 mmol/L (ref 98–111)
Creatinine, Ser: 0.89 mg/dL (ref 0.44–1.00)
GFR, Estimated: >60 mL/min (ref >60)
Glucose, Bld: 134 mg/dL — ABNORMAL HIGH (ref 70–99)
Potassium: 5.2 mmol/L — ABNORMAL HIGH (ref 3.5–5.1)
Sodium: 139 mmol/L (ref 135–145)
Total Bilirubin: 0.4 mg/dL (ref 0.3–1.2)
Total Protein: 6.5 g/dL (ref 6.5–8.1)

## 2020-03-02 LAB — MAGNESIUM: Magnesium: 2.1 mg/dL (ref 1.7–2.4)

## 2020-03-11 ENCOUNTER — Other Ambulatory Visit (HOSPITAL_COMMUNITY): Payer: Self-pay | Admitting: Physician Assistant

## 2020-03-11 DIAGNOSIS — Z9484 Stem cells transplant status: Secondary | ICD-10-CM | POA: Diagnosis not present

## 2020-03-11 DIAGNOSIS — R5383 Other fatigue: Secondary | ICD-10-CM | POA: Diagnosis not present

## 2020-03-11 DIAGNOSIS — C9202 Acute myeloblastic leukemia, in relapse: Secondary | ICD-10-CM | POA: Diagnosis not present

## 2020-03-11 DIAGNOSIS — C9030 Solitary plasmacytoma not having achieved remission: Secondary | ICD-10-CM | POA: Diagnosis not present

## 2020-03-11 DIAGNOSIS — R112 Nausea with vomiting, unspecified: Secondary | ICD-10-CM | POA: Diagnosis not present

## 2020-03-11 DIAGNOSIS — R531 Weakness: Secondary | ICD-10-CM | POA: Diagnosis not present

## 2020-03-11 DIAGNOSIS — C9002 Multiple myeloma in relapse: Secondary | ICD-10-CM | POA: Diagnosis not present

## 2020-03-11 DIAGNOSIS — R630 Anorexia: Secondary | ICD-10-CM | POA: Diagnosis not present

## 2020-03-11 DIAGNOSIS — D72819 Decreased white blood cell count, unspecified: Secondary | ICD-10-CM | POA: Diagnosis not present

## 2020-03-11 DIAGNOSIS — D649 Anemia, unspecified: Secondary | ICD-10-CM | POA: Diagnosis not present

## 2020-03-11 DIAGNOSIS — M546 Pain in thoracic spine: Secondary | ICD-10-CM | POA: Diagnosis not present

## 2020-03-11 MED FILL — SULFAMETHOXAZOLE-TMP DS TAB: 800-160 | 90 days supply | Qty: 39 | Fill #0

## 2020-03-13 DIAGNOSIS — C9000 Multiple myeloma not having achieved remission: Secondary | ICD-10-CM | POA: Diagnosis not present

## 2020-03-15 DIAGNOSIS — C9000 Multiple myeloma not having achieved remission: Secondary | ICD-10-CM | POA: Diagnosis not present

## 2020-03-25 MED FILL — ACYCLOVIR 800 MG TABLET: 800 | 30 days supply | Qty: 60 | Fill #0

## 2020-04-15 ENCOUNTER — Other Ambulatory Visit (HOSPITAL_COMMUNITY): Payer: Self-pay | Admitting: Physician Assistant

## 2020-04-15 DIAGNOSIS — Z79899 Other long term (current) drug therapy: Secondary | ICD-10-CM | POA: Diagnosis not present

## 2020-04-15 DIAGNOSIS — C9000 Multiple myeloma not having achieved remission: Secondary | ICD-10-CM | POA: Diagnosis not present

## 2020-04-15 DIAGNOSIS — D649 Anemia, unspecified: Secondary | ICD-10-CM | POA: Diagnosis not present

## 2020-04-15 DIAGNOSIS — R112 Nausea with vomiting, unspecified: Secondary | ICD-10-CM | POA: Diagnosis not present

## 2020-04-15 DIAGNOSIS — D72819 Decreased white blood cell count, unspecified: Secondary | ICD-10-CM | POA: Diagnosis not present

## 2020-04-15 DIAGNOSIS — Z9484 Stem cells transplant status: Secondary | ICD-10-CM | POA: Diagnosis not present

## 2020-04-15 DIAGNOSIS — C9002 Multiple myeloma in relapse: Secondary | ICD-10-CM | POA: Diagnosis not present

## 2020-04-15 DIAGNOSIS — M546 Pain in thoracic spine: Secondary | ICD-10-CM | POA: Diagnosis not present

## 2020-04-15 DIAGNOSIS — G629 Polyneuropathy, unspecified: Secondary | ICD-10-CM | POA: Diagnosis not present

## 2020-04-15 MED FILL — FOLIC ACID 1 MG TABS: 1 | 90 days supply | Qty: 90 | Fill #0

## 2020-05-01 MED FILL — PANTOPRAZOLE SOD DR 40 MG T: 40 | 90 days supply | Qty: 90 | Fill #3

## 2020-05-01 MED FILL — ACYCLOVIR 800 MG TABLET: 800 | 30 days supply | Qty: 60 | Fill #1

## 2020-05-27 ENCOUNTER — Other Ambulatory Visit: Payer: Self-pay | Admitting: Family Medicine

## 2020-05-27 MED FILL — LEVOTHYROXINE 75 MCG TABLET: 75 | 90 days supply | Qty: 90 | Fill #0

## 2020-05-27 MED FILL — ACYCLOVIR 800 MG TABLET: 800 | 30 days supply | Qty: 60 | Fill #2

## 2020-05-27 NOTE — Telephone Encounter (Signed)
May have refills have patient do a follow-up visit by June

## 2020-05-28 DIAGNOSIS — C9002 Multiple myeloma in relapse: Secondary | ICD-10-CM | POA: Diagnosis not present

## 2020-05-28 DIAGNOSIS — Z9484 Stem cells transplant status: Secondary | ICD-10-CM | POA: Diagnosis not present

## 2020-05-28 DIAGNOSIS — R918 Other nonspecific abnormal finding of lung field: Secondary | ICD-10-CM | POA: Diagnosis not present

## 2020-05-28 DIAGNOSIS — C9000 Multiple myeloma not having achieved remission: Secondary | ICD-10-CM | POA: Diagnosis not present

## 2020-05-31 DIAGNOSIS — Z9484 Stem cells transplant status: Secondary | ICD-10-CM | POA: Diagnosis not present

## 2020-05-31 DIAGNOSIS — C9000 Multiple myeloma not having achieved remission: Secondary | ICD-10-CM | POA: Diagnosis not present

## 2020-05-31 DIAGNOSIS — C9002 Multiple myeloma in relapse: Secondary | ICD-10-CM | POA: Diagnosis not present

## 2020-06-03 DIAGNOSIS — Z79899 Other long term (current) drug therapy: Secondary | ICD-10-CM | POA: Diagnosis not present

## 2020-06-03 DIAGNOSIS — Z792 Long term (current) use of antibiotics: Secondary | ICD-10-CM | POA: Diagnosis not present

## 2020-06-03 DIAGNOSIS — C9000 Multiple myeloma not having achieved remission: Secondary | ICD-10-CM | POA: Diagnosis not present

## 2020-06-03 DIAGNOSIS — Z7983 Long term (current) use of bisphosphonates: Secondary | ICD-10-CM | POA: Diagnosis not present

## 2020-06-03 DIAGNOSIS — M546 Pain in thoracic spine: Secondary | ICD-10-CM | POA: Diagnosis not present

## 2020-06-03 DIAGNOSIS — C779 Secondary and unspecified malignant neoplasm of lymph node, unspecified: Secondary | ICD-10-CM | POA: Diagnosis not present

## 2020-06-03 DIAGNOSIS — R0781 Pleurodynia: Secondary | ICD-10-CM | POA: Diagnosis not present

## 2020-06-03 DIAGNOSIS — Z9484 Stem cells transplant status: Secondary | ICD-10-CM | POA: Diagnosis not present

## 2020-06-03 DIAGNOSIS — G96191 Perineural cyst: Secondary | ICD-10-CM | POA: Diagnosis not present

## 2020-06-24 DIAGNOSIS — R112 Nausea with vomiting, unspecified: Secondary | ICD-10-CM | POA: Diagnosis not present

## 2020-06-24 DIAGNOSIS — R0781 Pleurodynia: Secondary | ICD-10-CM | POA: Diagnosis not present

## 2020-06-24 DIAGNOSIS — M546 Pain in thoracic spine: Secondary | ICD-10-CM | POA: Diagnosis not present

## 2020-06-24 DIAGNOSIS — Z9484 Stem cells transplant status: Secondary | ICD-10-CM | POA: Diagnosis not present

## 2020-06-24 DIAGNOSIS — C9000 Multiple myeloma not having achieved remission: Secondary | ICD-10-CM | POA: Diagnosis not present

## 2020-06-24 DIAGNOSIS — Z79899 Other long term (current) drug therapy: Secondary | ICD-10-CM | POA: Diagnosis not present

## 2020-06-24 DIAGNOSIS — G8929 Other chronic pain: Secondary | ICD-10-CM | POA: Diagnosis not present

## 2020-07-11 DIAGNOSIS — Z23 Encounter for immunization: Secondary | ICD-10-CM | POA: Diagnosis not present

## 2020-07-16 ENCOUNTER — Other Ambulatory Visit (HOSPITAL_COMMUNITY): Payer: Self-pay | Admitting: Physician Assistant

## 2020-07-16 MED FILL — LORAZEPAM 1 MG TABS: 1 | 7 days supply | Qty: 30 | Fill #0

## 2020-07-17 ENCOUNTER — Other Ambulatory Visit (HOSPITAL_BASED_OUTPATIENT_CLINIC_OR_DEPARTMENT_OTHER): Payer: Self-pay

## 2020-07-17 ENCOUNTER — Other Ambulatory Visit (HOSPITAL_COMMUNITY): Payer: Self-pay | Admitting: Physician Assistant

## 2020-07-17 MED FILL — FOLIC ACID 1 MG TABS: 1 | 30 days supply | Qty: 30 | Fill #0

## 2020-07-23 ENCOUNTER — Other Ambulatory Visit (HOSPITAL_BASED_OUTPATIENT_CLINIC_OR_DEPARTMENT_OTHER): Payer: Self-pay

## 2020-07-23 DIAGNOSIS — Z20822 Contact with and (suspected) exposure to covid-19: Secondary | ICD-10-CM | POA: Diagnosis not present

## 2020-07-23 DIAGNOSIS — R0781 Pleurodynia: Secondary | ICD-10-CM | POA: Diagnosis not present

## 2020-07-23 DIAGNOSIS — M546 Pain in thoracic spine: Secondary | ICD-10-CM | POA: Diagnosis not present

## 2020-07-23 DIAGNOSIS — Z7983 Long term (current) use of bisphosphonates: Secondary | ICD-10-CM | POA: Diagnosis not present

## 2020-07-23 DIAGNOSIS — Z9484 Stem cells transplant status: Secondary | ICD-10-CM | POA: Diagnosis not present

## 2020-07-23 DIAGNOSIS — Z8349 Family history of other endocrine, nutritional and metabolic diseases: Secondary | ICD-10-CM | POA: Diagnosis not present

## 2020-07-23 DIAGNOSIS — Z981 Arthrodesis status: Secondary | ICD-10-CM | POA: Diagnosis not present

## 2020-07-23 DIAGNOSIS — Z792 Long term (current) use of antibiotics: Secondary | ICD-10-CM | POA: Diagnosis not present

## 2020-07-23 DIAGNOSIS — Z79899 Other long term (current) drug therapy: Secondary | ICD-10-CM | POA: Diagnosis not present

## 2020-07-23 DIAGNOSIS — C9000 Multiple myeloma not having achieved remission: Secondary | ICD-10-CM | POA: Diagnosis not present

## 2020-08-02 ENCOUNTER — Other Ambulatory Visit (HOSPITAL_COMMUNITY): Payer: Self-pay

## 2020-08-12 ENCOUNTER — Other Ambulatory Visit (HOSPITAL_COMMUNITY): Payer: Self-pay

## 2020-08-19 ENCOUNTER — Other Ambulatory Visit (HOSPITAL_COMMUNITY): Payer: Self-pay

## 2020-08-19 MED ORDER — PANTOPRAZOLE SODIUM 40 MG PO TBEC
1.0000 | DELAYED_RELEASE_TABLET | Freq: Every day | ORAL | 3 refills | Status: DC
Start: 2020-08-19 — End: 2021-02-28
  Filled 2020-08-19: qty 90, 90d supply, fill #0

## 2020-08-20 ENCOUNTER — Other Ambulatory Visit (HOSPITAL_COMMUNITY): Payer: Self-pay

## 2020-08-20 DIAGNOSIS — Z029 Encounter for administrative examinations, unspecified: Secondary | ICD-10-CM

## 2020-08-22 ENCOUNTER — Ambulatory Visit (INDEPENDENT_AMBULATORY_CARE_PROVIDER_SITE_OTHER): Payer: BC Managed Care – PPO | Admitting: Adult Health

## 2020-08-22 ENCOUNTER — Other Ambulatory Visit: Payer: Self-pay

## 2020-08-22 ENCOUNTER — Encounter: Payer: Self-pay | Admitting: Adult Health

## 2020-08-22 ENCOUNTER — Other Ambulatory Visit (HOSPITAL_COMMUNITY)
Admission: RE | Admit: 2020-08-22 | Discharge: 2020-08-22 | Disposition: A | Discharge status: Home / Self Care | Payer: BC Managed Care – PPO | Source: Ambulatory Visit | Attending: Adult Health | Admitting: Adult Health

## 2020-08-22 VITALS — BP 105/69 | HR 96 | Ht 62.0 in | Wt 108.0 lb

## 2020-08-22 DIAGNOSIS — Z1231 Encounter for screening mammogram for malignant neoplasm of breast: Secondary | ICD-10-CM | POA: Diagnosis not present

## 2020-08-22 DIAGNOSIS — Z01419 Encounter for gynecological examination (general) (routine) without abnormal findings: Secondary | ICD-10-CM | POA: Insufficient documentation

## 2020-08-22 DIAGNOSIS — C9000 Multiple myeloma not having achieved remission: Secondary | ICD-10-CM | POA: Diagnosis not present

## 2020-08-22 DIAGNOSIS — Z1211 Encounter for screening for malignant neoplasm of colon: Secondary | ICD-10-CM

## 2020-08-22 LAB — HEMOCCULT GUIAC POC 1CARD (OFFICE): Fecal Occult Blood, POC: NEGATIVE

## 2020-08-22 NOTE — Progress Notes (Signed)
Patient ID: Melissa [name], female   DOB: [birth year], 65 y.o.   MRN: [id number] History of Present Illness: Melissa [name] is a 65 year old white female, married, PM in for well woman gyn exam and pap. She has multiple myeloma and had bne marrow transplant in October 2021 with her own STEM cells. She has retired from the lab at [employer]. PCP [name] MD.   Current Medications, Allergies, Past Medical History, Past Surgical History, Family History and Social History were reviewed in [employer] record.     Review of Systems: Patient denies any headaches, hearing loss, fatigue, blurred vision, shortness of breath, chest pain, abdominal pain, problems with bowel movements, urination, or intercourse(not active). No joint pain or mood swings.    Physical Exam:BP 105/69 (BP Location: Left Arm, Patient Position: Sitting, Cuff Size: Normal)   Pulse 96   Ht _0  (1.575 m)   Wt 108 lb (49 kg)   BMI 19.75 kg/m  General:  Well developed, well nourished, no acute distress Skin:  Warm and dry Neck:  Midline trachea, normal thyroid, good ROM, no lymphadenopathy Lungs; Clear to auscultation bilaterally Breast:  No dominant palpable mass, retraction, or nipple discharge Cardiovascular: Regular rate and rhythm Abdomen:  Soft, non tender, no hepatosplenomegaly Pelvic:  External genitalia is normal in appearance, no lesions.  The vagina is pale with loss of moisture and rugae. Urethra has no lesions or masses. The cervix is smooth, pap with HR HPV genotyping performed  Uterus is felt to be normal size, shape, and contour.  No adnexal masses or tenderness noted.Bladder is non tender, no masses felt. Rectal: Good sphincter tone, no polyps, or hemorrhoids felt.  Hemoccult negative. Extremities/musculoskeletal:  No swelling or varicosities noted, no clubbing or cyanosis Psych:  No mood changes, alert and cooperative,seems happy AA is 2 Fall risk is low PHQ 9 score is 2 GAD 7 score is  2  [facility] - 08/22/20 1342      Pregnancy Intention Screening   Does the patient want to become pregnant in the next year? No    Does the patient's partner want to become pregnant in the next year? No    Would the patient like to discuss contraceptive options today? No      Contraception Wrap Up   Current Method Female Sterilization    End Method Female Sterilization   postmenopausal   Contraception Counseling Provided No         Examination chaperoned by [facility] LPN.   Impression and Plan:  1. Encounter for gynecological examination with Papanicolaou smear of cervix Pap sent Physical in 1 year Pap in 3 years  2. Screening mammogram for breast cancer Order is in, so call for appt  3. Encounter for screening fecal occult blood testing  4. Multiple myeloma, remission status unspecified ([place])

## 2020-08-26 LAB — CYTOLOGY - PAP
Comment: NEGATIVE
Diagnosis: NEGATIVE
High risk HPV: NEGATIVE

## 2020-09-04 ENCOUNTER — Encounter: Payer: Self-pay | Admitting: Radiation Oncology

## 2020-09-04 NOTE — Progress Notes (Signed)
Radiation Oncology         [phone] ________________________________  Name: Melissa [name] MRN: [id number]  Date: 09/04/2020  DOB: May 13, 1955  Chart Note:  I received a call from Dr. Lucia Gaskins regarding this patient.  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  Bone marrow assessment reveals normal cellular marrow with no increase in plasma cells.  However, PET/CT shows uptake in the right humerus with an SUV max of 12.9, previously 1.8 and a second lesion was seen in the distal left femur with SUV max of 4.7, previously 8.6 as well as a proximal left femur lesion with an SUV max of 3.9, previously 2.8 the patient has started to notice right arm pain.  Plain film x-rays of the humerus do not show any lytic lesion.  Dr. Marcell Anger referred the patient for [place] to consult regarding palliative radiotherapy.  I will work to schedule a follow-up visit for Ms. Eastep with potential CT simulation for palliative radiation to the right humerus and left femur, if appropriate.     Radiology Findings at [facility]:  PET MULTIPLE MYELOMA (W/LOW DOSE CT), 05/28/2020 11:19 AM   INDICATION: Multiple myeloma, follow up   COMPARISON: PET/CT 01/29/2020, 11/15/2019   TECHNIQUE: 61 minutes after the intravenous injection of 11.4 mCi F-18 FDG, images were obtained from the skull base through the ankles. These images were attenuation corrected using CT. Standardized uptake values (SUV) were calculated using a lean body massalgorithm. Blood glucose at the time of injection was 92 mg/dL.   [facility] and its affiliates are committed to minimizing radiation dose to patients while maintaining necessary diagnostic image quality. All CT scans are therefore performed using "As Low As Reasonably Achievable (ALARA)" protocols with either manual or automated exposure controls calibrated to the age and size of each patient.   SUVmax of  blood pool: 1.8  SUVmax of liver: 2.1   LIMITATIONS: The low-dose CT acquisition was performed only for attenuation correction/activity localization. There is no intravenous contrast, further limiting the CT component of the study. This modality has limited utility for detection or characterization of small lung nodules. Evaluation of the vasculature is limited by lack of IV contrast. Evaluation of the kidney, ureters, and bladder are limited by urinary excretion of radiotracer. Physiologic bowel uptake of FDG and lack of CT contrast limit evaluation of the bowel.   FINDINGS:   Disease related findings:  - Increased FDG avidity of a focus within the mid diaphysis of the right humerus (SUVmax 12.9, previously SUV max 1.8).  - Focal FDG avidity within the distal left femoral diaphysis (SUV max 4.7), which appears decreased from prior (previous SUV max 8.6).  - Focal FDG avidity within the proximal left femur (SUVmax 3.9), which is increased from prior (max is a 2.8).   - Low level FDG avidity throughout the right femur, which is below blood pool (SUV max 1.5)   Previously noted soft tissue lesions are not visualized on this examination.   No new foci of FDG avidity.   Other tracer related findings:   Expected physiologic activity within the kidneys, ureter, bladder, oropharynx, salivary glands, stomach, bowel and brain.   CT findings:   Right frontal approach ventriculostomy catheter in place with tip terminating in the third ventricle, similar to prior.   Postsurgical changes of the upper thoracic posterior rod and screw fixation.   Cholelithiasis.   Focal splenic artery aneurysm  with calcifications.   Scattered or sclerotic foci throughout the axial and appendicular skeleton.   Similar compression fracture of L3 without interval height loss. Similar compression fracture of L5 without substantial interval height loss.   Similar appearance of the bilateral pulmonary nodules. No  substantial FDG avidity.    CONCLUSION:   1. Mildly increased FDG avidity of the osseous lesions within the right humerus and proximal left femur consistent with residual myelomatous disease.  2. No FDG avid myelomatous soft tissue lesions.  3. Similar-appearing non-FDG avid bilateral pulmonary nodules. Attention on follow up.   ________________________________  Sheral Apley. [name], M.D.

## 2020-09-05 ENCOUNTER — Encounter: Payer: Self-pay | Admitting: Urology

## 2020-09-05 NOTE — Progress Notes (Addendum)
Patients meaningful use is complete . Patient is aware that this is a my chart visit.Patient is aware that she does not have to be on the computer until 930 due to nursing portion being completed

## 2020-09-06 ENCOUNTER — Other Ambulatory Visit: Payer: Self-pay

## 2020-09-06 ENCOUNTER — Ambulatory Visit (HOSPITAL_COMMUNITY)
Admission: RE | Admit: 2020-09-06 | Discharge: 2020-09-06 | Disposition: A | Discharge status: Home / Self Care | Payer: BC Managed Care – PPO | Source: Ambulatory Visit | Attending: Adult Health | Admitting: Adult Health

## 2020-09-06 DIAGNOSIS — Z1231 Encounter for screening mammogram for malignant neoplasm of breast: Secondary | ICD-10-CM

## 2020-09-10 ENCOUNTER — Telehealth: Payer: Self-pay | Admitting: Radiation Oncology

## 2020-09-10 NOTE — Telephone Encounter (Signed)
Received call from patient confused about arrival time for appointment tomorrow. Instructed patient to arrive at 1230 for a 1300 consult with Dr. [name] and 1330 simulation. Patient verbalized understanding and confirmed she will be present at requested times.

## 2020-09-11 ENCOUNTER — Ambulatory Visit
Admission: RE | Admit: 2020-09-11 | Discharge: 2020-09-11 | Disposition: A | Discharge status: Home / Self Care | Payer: BC Managed Care – PPO | Source: Ambulatory Visit | Attending: Radiation Oncology | Admitting: Radiation Oncology

## 2020-09-11 ENCOUNTER — Other Ambulatory Visit: Payer: Self-pay

## 2020-09-11 ENCOUNTER — Inpatient Hospital Stay: Admission: RE | Admit: 2020-09-11 | Payer: Self-pay | Source: Ambulatory Visit

## 2020-09-11 ENCOUNTER — Telehealth: Payer: Self-pay | Admitting: Radiation Oncology

## 2020-09-11 ENCOUNTER — Ambulatory Visit
Admission: RE | Admit: 2020-09-11 | Discharge: 2020-09-11 | Disposition: A | Discharge status: Home / Self Care | Payer: BC Managed Care – PPO | Source: Ambulatory Visit | Attending: Urology | Admitting: Urology

## 2020-09-11 ENCOUNTER — Encounter: Payer: Self-pay | Admitting: Radiation Oncology

## 2020-09-11 VITALS — BP 100/61 | HR 96 | Temp 98.4°F | Resp 18 | Ht 62.0 in | Wt 107.4 lb

## 2020-09-11 DIAGNOSIS — C9002 Multiple myeloma in relapse: Secondary | ICD-10-CM

## 2020-09-11 DIAGNOSIS — Z7952 Long term (current) use of systemic steroids: Secondary | ICD-10-CM | POA: Diagnosis not present

## 2020-09-11 DIAGNOSIS — C9000 Multiple myeloma not having achieved remission: Secondary | ICD-10-CM | POA: Diagnosis not present

## 2020-09-11 DIAGNOSIS — C7951 Secondary malignant neoplasm of bone: Secondary | ICD-10-CM

## 2020-09-11 DIAGNOSIS — Z9484 Stem cells transplant status: Secondary | ICD-10-CM | POA: Insufficient documentation

## 2020-09-11 DIAGNOSIS — Z79899 Other long term (current) drug therapy: Secondary | ICD-10-CM | POA: Diagnosis not present

## 2020-09-11 DIAGNOSIS — C7952 Secondary malignant neoplasm of bone marrow: Secondary | ICD-10-CM

## 2020-09-11 DIAGNOSIS — Z923 Personal history of irradiation: Secondary | ICD-10-CM | POA: Diagnosis not present

## 2020-09-11 DIAGNOSIS — Z510 Encounter for antineoplastic radiation therapy: Secondary | ICD-10-CM | POA: Diagnosis not present

## 2020-09-11 NOTE — Progress Notes (Signed)
Histology and Location of Primary Cancer: Recurrent myeloma  Sites of Visceral and Bony Metastatic Disease: Bony  Location(s) of Symptomatic Metastases: PET/CT shows uptake in the right humerus with an SUV max of 12.9, previously 1.8 and a second lesion was seen in the distal left femur with SUV max of 4.7, previously 8.6 as well as a proximal left femur lesion with an SUV max of 3.9, previously 2.8 the patient has started to notice right arm pain.  Past/Anticipated chemotherapy by medical oncology, if any: Continues routine follow up with Dr. Marcell Anger and her neurosurgeon at [facility]  Pain on a scale of 0-10 is: 1 Patient complains of pain to right arm.   If Spine Met(s), symptoms, if any, include:  Bowel/Bladder retention or incontinence: No  Numbness or weakness in extremities : No  Current Decadron regimen, if applicable: Patient currently on a 4 week cycle.   Ambulatory status? Walker? Wheelchair?: Patient ambulatory without assistance.  SAFETY ISSUES:  Prior radiation? YES 10/24/19 - 11/07/19:Thedural based lesion in the clivuswastreated to 20Gy in 95factions  1/25-06/05/19:T4-T8 were treated to 20 Gy in 10 fractions of 2 Gy  05/05/2019: Lumbar spine and sacral metastases /20 Gy in5 fractions(Dr. Farris/[facility])  03/27/19-04/07/19:  1. Left scapula andC7 - T2/ 20 Gy in 10 fractions 2. Right hip / 20 Gy in 10 fractions  10/2008: Palliative radiotherapy to T1-T7 ([name]  Pacemaker/ICD? no  Possible current pregnancy? no  Is the patient on methotrexate? no  Current Complaints / other details:  65year old female.

## 2020-09-11 NOTE — Progress Notes (Signed)
Radiation Oncology         [phone] ________________________________  Name: Melissa [name] MRN: [id number]  Date: 09/11/2020  DOB: 1955-09-22  Follow-Up Visit Note  CC: Kathyrn Drown, MD  Ginette Pitman, MD  Diagnosis:   65 yo woman with multiple myeloma and 3 areas of active involvement in the right humerus and left femur    ICD-10-CM   1. Secondary malignant neoplasm of bone and bone marrow (HCC)  C79.51    C79.52   2. Multiple myeloma, remission status unspecified (HCC)  C90.00     Interval Since Last Radiation:  10 months 10/24/19 - 11/07/19:Thedural based lesion in the clivuswastreated to 20Gy in 37factions  1/25-06/05/19:T4-T8 were treated to 20 Gy in 10 fractions of 2 Gy  05/05/2019: Lumbar spine and sacral metastases /20 Gy in5 fractions(Dr. Farris/[facility])  03/27/19-04/07/19:  1. Left scapula andC7 - T2/ 20 Gy in 10 fractions 2. Right hip / 20 Gy in 10 fractions  10/2008: Palliative radiotherapy to T1-T7 ([name]  Narrative:  The patient returns today for re-evaluation.   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  Bone marrow assessment reveals normal cellular marrow with no increase in plasma cells.  However, PET/CT shows uptake in the right humerus with an SUV max of 12.9, previously 1.8 and a second lesion was seen in the distal left femur with SUV max of 4.7, previously 8.6 as well as a proximal left femur lesion with an SUV max of 3.9, previously 2.8 the patient has started to notice right arm pain.  Plain film x-rays of the humerus do not show any lytic lesion.                                ALLERGIES:  is allergic to hydromorphone hcl, poison ivy extract [poison ivy extract], and tape.  Meds: Current Outpatient Medications  Medication Sig Dispense Refill  . acetaminophen (TYLENOL) 500 MG tablet Take 1 to 2   tablet(s) by mouth only as needed for pain *OCCASIONAL use only    . acyclovir  (ZOVIRAX) 800 MG tablet TAKE 1 TABLET BY MOUTH 2 TIMES DAILY 60 tablet 10  . Calcium Carbonate-Vitamin D (CALCIUM + D PO) Take 1 tablet by mouth daily.    . Cholecalciferol (VITAMIN D) 2000 UNITS tablet Take 2,000 Units by mouth daily.    . cyclophosphamide (CYTOXAN) 50 MG capsule Take by mouth.    . dexamethasone (DECADRON) 2 MG tablet Take by mouth.    . dexamethasone (DECADRON) 4 MG tablet Take 10 tablets (40 mg total) by mouth once a week. 40 tablet 2  . folic acid (FOLVITE) 1 MG tablet TAKE 1 TABLET (1 MG TOTAL) BY MOUTH DAILY. 90 tablet 0  . folic acid (FOLVITE) 1 MG tablet TAKE 1 TABLET BY MOUTH DAILY. 90 tablet 0  . hydrocortisone-pramoxine (ANALPRAM-HC) 2.5-1 % rectal cream Place 1 application rectally 3 (three) times daily. 30 g 1  . levothyroxine (SYNTHROID) 75 MCG tablet TAKE 1 TABLET BY MOUTH ONCE DAILY **PLEASE SCHEDULE A FOLLOW UP APPT. FOR JUNE 90 tablet 1  . loperamide (IMODIUM A-D) 2 MG tablet Take by mouth.    .Marland KitchenLORazepam (ATIVAN) 1 MG tablet TAKE 1 TABLET BY MOUTH EVERY 6 HOURS AS NEEDED FOR ANXIETY (OR ANTICIPATORY NAUSEA). 30 tablet 0  . Multiple Vitamin (MULTI-VITAMIN) tablet Take 1 tablet by mouth daily.    .Marland Kitchen  omeprazole (PRILOSEC) 20 MG capsule Take 20 mg by mouth daily.    . ondansetron (ZOFRAN) 8 MG tablet Take 1 tablet (8 mg) 30 minutes prior to cyclophosphamide dose on treatment days, and then take 1 tablet (8 mg) every 8 hours as needed for nausea or vomiting.    Marland Kitchen oxyCODONE (OXY IR/ROXICODONE) 5 MG immediate release tablet Take by mouth.    . pantoprazole (PROTONIX) 40 MG tablet Take 1 tablet (40 mg total) by mouth daily. 90 tablet 3  . prochlorperazine (COMPAZINE) 10 MG tablet Take 1 tablet (10 mg total) by mouth every 6 (six) hours as needed for nausea or vomiting. 45 tablet 1  . senna-docusate (SENOKOT-S) 8.6-50 MG tablet Take by mouth.     . traMADol (ULTRAM) 50 MG tablet Take 50 mg by mouth every 8 (eight) hours as needed.     . vitamin B-12 (CYANOCOBALAMIN) 500  MCG tablet Take by mouth.    . pantoprazole (PROTONIX) 40 MG tablet Take by mouth.    . pantoprazole (PROTONIX) 40 MG tablet TAKE 1 TABLET BY MOUTH ONCE A DAY 90 tablet 3   No current facility-administered medications for this encounter.   Facility-Administered Medications Ordered in Other Encounters  Medication Dose Route Frequency Provider Last Rate Last Admin  . 0.9 %  sodium chloride infusion   Intravenous Continuous Baird Cancer, PA-C 20 mL/hr at 12/05/10 0947 New Bag at 12/05/10 0947  . 0.9 %  sodium chloride infusion   Intravenous Continuous Everardo All, MD 20 mL/hr at 02/26/11 1528 New Bag at 02/26/11 1528  . sodium chloride 0.9 % injection 10 mL  10 mL Intravenous PRN Everardo All, MD   10 mL at 02/26/11 1518    Physical Findings: The patient is in no acute distress. Patient is alert and oriented.  height is 5' 2"  (1.575 m) and weight is 107 lb 6.4 oz (48.7 kg). Her temperature is 98.4 F (36.9 C). Her blood pressure is 100/61 and her pulse is 96. Her respiration is 18 and oxygen saturation is 100%. .  No significant changes.  She does localize pain and an enlarging mass in the right proximal humerus/shoulder laterally.  Lab Findings: Lab Results  Component Value Date   WBC 4.9 03/02/2020   HGB 10.7 (L) 03/02/2020   HCT 33.8 (L) 03/02/2020   PLT 114 (L) 03/02/2020    Lab Results  Component Value Date   NA 139 03/02/2020   K 5.2 (H) 03/02/2020   CO2 27 03/02/2020   GLUCOSE 134 (H) 03/02/2020   BUN 10 03/02/2020   CREATININE 0.89 03/02/2020   BILITOT 0.4 03/02/2020   ALKPHOS 72 03/02/2020   AST 28 03/02/2020   ALT 16 03/02/2020   PROT 6.5 03/02/2020   ALBUMIN 3.7 03/02/2020   CALCIUM 9.6 03/02/2020   ANIONGAP 9 03/02/2020    Radiographic Findings: MM 3D SCREEN BREAST BILATERAL  Result Date: 09/06/2020 CLINICAL DATA:  Screening. EXAM: DIGITAL SCREENING BILATERAL MAMMOGRAM WITH TOMOSYNTHESIS AND CAD TECHNIQUE: Bilateral screening digital craniocaudal  and mediolateral oblique mammograms were obtained. Bilateral screening digital breast tomosynthesis was performed. The images were evaluated with computer-aided detection. COMPARISON:  Previous exam(s). ACR Breast Density Category c: The breast tissue is heterogeneously dense, which may obscure small masses. FINDINGS: There are no findings suspicious for malignancy. The images were evaluated with computer-aided detection. IMPRESSION: No mammographic evidence of malignancy. A result letter of this screening mammogram will be mailed directly to the patient. RECOMMENDATION: Screening mammogram in one year. (  Code:SM-B-01Y) BI-RADS CATEGORY  1: Negative. Electronically Signed   By: Dorise Bullion III M.D   On: 09/06/2020 13:34    Impression:  The patient has three active areas of myeloma with some pain.  She may benefit from localized radiotherapy.  Plan:  Today, I talked to the patient and family about the findings and work-up thus far.  We discussed the natural history of myeloma and general treatment, highlighting the role of focused palliative radiotherapy in the management.  We discussed the available radiation techniques, and focused on the details of logistics and delivery.  We reviewed the anticipated acute and late sequelae associated with radiation in this setting.  The patient was encouraged to ask questions that I answered to the best of my ability.  The patient would like to proceed with radiation and will be scheduled for CT simulation today.  I spent 15 minutes minutes face to face with the patient and more than 50% of that time was spent in counseling and/or coordination of care.   _____________________________________  Sheral Apley. [name], M.D.

## 2020-09-12 ENCOUNTER — Ambulatory Visit
Admission: RE | Admit: 2020-09-12 | Discharge: 2020-09-12 | Disposition: A | Discharge status: Home / Self Care | Payer: Self-pay | Source: Ambulatory Visit | Attending: Radiation Oncology | Admitting: Radiation Oncology

## 2020-09-12 ENCOUNTER — Other Ambulatory Visit: Payer: Self-pay | Admitting: Radiation Oncology

## 2020-09-12 DIAGNOSIS — C9002 Multiple myeloma in relapse: Secondary | ICD-10-CM

## 2020-09-12 NOTE — Progress Notes (Signed)
  Radiation Oncology         [phone] ________________________________  Name: Melissa [name] MRN: [id number]  Date: 09/11/2020  DOB: [birth year]  SIMULATION AND TREATMENT PLANNING NOTE    ICD-10-CM   1. Multiple myeloma, remission status unspecified ([place])  C90.00     DIAGNOSIS:   65 yo woman with multiple myeloma and 3 areas of active involvement in the right humerus and left femur  NARRATIVE:  The patient was brought to the [facility].  Identity was confirmed.  All relevant records and images related to the planned course of therapy were reviewed.  The patient freely provided informed written consent to proceed with treatment after reviewing the details related to the planned course of therapy. The consent form was witnessed and verified by the simulation staff.  Then, the patient was set-up in a stable reproducible  supine position for radiation therapy.  CT images were obtained.  Surface markings were placed.  The CT images were loaded into the planning software.  Then the target and avoidance structures were contoured.  Treatment planning then occurred.  The radiation prescription was entered and confirmed.  Then, I designed and supervised the construction of a total of at least 4 medically necessary complex treatment devices.  I have requested : Isodose Plan.  PLAN:  The patient will receive 20 Gy in 10 fractions to the proximal right humerus and two areas in the left femur.  ________________________________  Sheral Apley. [name], M.D.

## 2020-09-17 DIAGNOSIS — Z510 Encounter for antineoplastic radiation therapy: Secondary | ICD-10-CM | POA: Diagnosis not present

## 2020-09-18 ENCOUNTER — Ambulatory Visit
Admission: RE | Admit: 2020-09-18 | Discharge: 2020-09-18 | Disposition: A | Discharge status: Home / Self Care | Payer: BC Managed Care – PPO | Source: Ambulatory Visit | Attending: Radiation Oncology | Admitting: Radiation Oncology

## 2020-09-18 ENCOUNTER — Other Ambulatory Visit: Payer: Self-pay

## 2020-09-18 DIAGNOSIS — Z510 Encounter for antineoplastic radiation therapy: Secondary | ICD-10-CM | POA: Diagnosis not present

## 2020-09-19 ENCOUNTER — Ambulatory Visit
Admission: RE | Admit: 2020-09-19 | Discharge: 2020-09-19 | Disposition: A | Discharge status: Home / Self Care | Payer: BC Managed Care – PPO | Source: Ambulatory Visit | Attending: Radiation Oncology | Admitting: Radiation Oncology

## 2020-09-19 DIAGNOSIS — Z510 Encounter for antineoplastic radiation therapy: Secondary | ICD-10-CM | POA: Diagnosis not present

## 2020-09-20 ENCOUNTER — Other Ambulatory Visit: Payer: Self-pay

## 2020-09-20 ENCOUNTER — Ambulatory Visit
Admission: RE | Admit: 2020-09-20 | Discharge: 2020-09-20 | Disposition: A | Discharge status: Home / Self Care | Payer: BC Managed Care – PPO | Source: Ambulatory Visit | Attending: Radiation Oncology | Admitting: Radiation Oncology

## 2020-09-20 DIAGNOSIS — Z510 Encounter for antineoplastic radiation therapy: Secondary | ICD-10-CM | POA: Diagnosis not present

## 2020-09-24 ENCOUNTER — Other Ambulatory Visit: Payer: Self-pay

## 2020-09-24 ENCOUNTER — Ambulatory Visit
Admission: RE | Admit: 2020-09-24 | Discharge: 2020-09-24 | Disposition: A | Discharge status: Home / Self Care | Payer: BC Managed Care – PPO | Source: Ambulatory Visit | Attending: Radiation Oncology | Admitting: Radiation Oncology

## 2020-09-24 DIAGNOSIS — Z510 Encounter for antineoplastic radiation therapy: Secondary | ICD-10-CM | POA: Diagnosis not present

## 2020-09-25 ENCOUNTER — Ambulatory Visit
Admission: RE | Admit: 2020-09-25 | Discharge: 2020-09-25 | Disposition: A | Discharge status: Home / Self Care | Payer: BC Managed Care – PPO | Source: Ambulatory Visit | Attending: Radiation Oncology | Admitting: Radiation Oncology

## 2020-09-25 ENCOUNTER — Other Ambulatory Visit: Payer: Self-pay

## 2020-09-25 DIAGNOSIS — C9000 Multiple myeloma not having achieved remission: Secondary | ICD-10-CM | POA: Insufficient documentation

## 2020-09-25 DIAGNOSIS — Z510 Encounter for antineoplastic radiation therapy: Secondary | ICD-10-CM | POA: Diagnosis present

## 2020-09-26 ENCOUNTER — Other Ambulatory Visit: Payer: Self-pay

## 2020-09-26 ENCOUNTER — Ambulatory Visit
Admission: RE | Admit: 2020-09-26 | Discharge: 2020-09-26 | Disposition: A | Discharge status: Home / Self Care | Payer: BC Managed Care – PPO | Source: Ambulatory Visit | Attending: Radiation Oncology | Admitting: Radiation Oncology

## 2020-09-26 DIAGNOSIS — Z510 Encounter for antineoplastic radiation therapy: Secondary | ICD-10-CM | POA: Diagnosis not present

## 2020-09-27 ENCOUNTER — Ambulatory Visit
Admission: RE | Admit: 2020-09-27 | Discharge: 2020-09-27 | Disposition: A | Discharge status: Home / Self Care | Payer: BC Managed Care – PPO | Source: Ambulatory Visit | Attending: Radiation Oncology | Admitting: Radiation Oncology

## 2020-09-27 DIAGNOSIS — Z510 Encounter for antineoplastic radiation therapy: Secondary | ICD-10-CM | POA: Diagnosis not present

## 2020-09-30 ENCOUNTER — Ambulatory Visit
Admission: RE | Admit: 2020-09-30 | Discharge: 2020-09-30 | Disposition: A | Discharge status: Home / Self Care | Payer: BC Managed Care – PPO | Source: Ambulatory Visit | Attending: Radiation Oncology | Admitting: Radiation Oncology

## 2020-09-30 ENCOUNTER — Other Ambulatory Visit: Payer: Self-pay

## 2020-09-30 DIAGNOSIS — Z510 Encounter for antineoplastic radiation therapy: Secondary | ICD-10-CM | POA: Diagnosis not present

## 2020-10-01 ENCOUNTER — Ambulatory Visit
Admission: RE | Admit: 2020-10-01 | Discharge: 2020-10-01 | Disposition: A | Discharge status: Home / Self Care | Payer: BC Managed Care – PPO | Source: Ambulatory Visit | Attending: Radiation Oncology | Admitting: Radiation Oncology

## 2020-10-01 DIAGNOSIS — Z510 Encounter for antineoplastic radiation therapy: Secondary | ICD-10-CM | POA: Diagnosis not present

## 2020-10-02 ENCOUNTER — Other Ambulatory Visit: Payer: Self-pay

## 2020-10-02 ENCOUNTER — Ambulatory Visit
Admission: RE | Admit: 2020-10-02 | Discharge: 2020-10-02 | Disposition: A | Discharge status: Home / Self Care | Payer: BC Managed Care – PPO | Source: Ambulatory Visit | Attending: Radiation Oncology | Admitting: Radiation Oncology

## 2020-10-02 ENCOUNTER — Encounter: Payer: Self-pay | Admitting: Urology

## 2020-10-02 DIAGNOSIS — C9002 Multiple myeloma in relapse: Secondary | ICD-10-CM

## 2020-10-02 DIAGNOSIS — Z510 Encounter for antineoplastic radiation therapy: Secondary | ICD-10-CM | POA: Diagnosis not present

## 2020-10-14 ENCOUNTER — Telehealth: Payer: Self-pay | Admitting: *Deleted

## 2020-10-14 DIAGNOSIS — L237 Allergic contact dermatitis due to plants, except food: Secondary | ICD-10-CM

## 2020-10-14 MED ORDER — TRIAMCINOLONE ACETONIDE 0.1 % EX CREA: TOPICAL_CREAM | CUTANEOUS | 2 refills | Status: AC

## 2020-10-14 NOTE — Telephone Encounter (Signed)
Triamcinolone 0.1% cream, 45 g, apply twice daily as needed, 2 refills

## 2020-10-14 NOTE — Telephone Encounter (Signed)
Patient made aware of prescription sent to pharmacy.

## 2020-10-14 NOTE — Telephone Encounter (Signed)
Patient left message on machine that she would like a refill on triamcinolone cream for poison ivy  Patient would like this sent to [facility]   [phone]

## 2020-10-27 ENCOUNTER — Other Ambulatory Visit: Payer: Self-pay | Admitting: Family Medicine

## 2020-10-29 NOTE — Telephone Encounter (Signed)
May have 90-day needs follow-up office visit and lab work

## 2020-11-07 ENCOUNTER — Encounter (HOSPITAL_COMMUNITY): Payer: Self-pay | Admitting: Hematology

## 2020-11-07 ENCOUNTER — Encounter (HOSPITAL_COMMUNITY): Payer: Self-pay

## 2020-11-11 ENCOUNTER — Other Ambulatory Visit: Payer: Self-pay

## 2020-11-11 ENCOUNTER — Encounter (HOSPITAL_COMMUNITY): Payer: Self-pay

## 2020-11-11 ENCOUNTER — Encounter (HOSPITAL_COMMUNITY): Payer: Self-pay | Admitting: Hematology

## 2020-11-11 ENCOUNTER — Other Ambulatory Visit (HOSPITAL_COMMUNITY)
Admission: RE | Admit: 2020-11-11 | Discharge: 2020-11-11 | Disposition: A | Discharge status: Home / Self Care | Payer: BC Managed Care – PPO | Source: Ambulatory Visit | Attending: Physician Assistant | Admitting: Physician Assistant

## 2020-11-11 DIAGNOSIS — C9000 Multiple myeloma not having achieved remission: Secondary | ICD-10-CM | POA: Insufficient documentation

## 2020-11-11 LAB — CBC WITH DIFFERENTIAL/PLATELET
Abs Immature Granulocytes: 0.13 10*3/uL — ABNORMAL HIGH (ref 0.00–0.07)
Basophils Absolute: 0 10*3/uL (ref 0.0–0.1)
Basophils Relative: 0 %
Eosinophils Absolute: 0.1 10*3/uL (ref 0.0–0.5)
Eosinophils Relative: 1 %
HCT: 33.8 % — ABNORMAL LOW (ref 36.0–46.0)
Hemoglobin: 11.3 g/dL — ABNORMAL LOW (ref 12.0–15.0)
Immature Granulocytes: 2 %
Lymphocytes Relative: 4 %
Lymphs Abs: 0.2 10*3/uL — ABNORMAL LOW (ref 0.7–4.0)
MCH: 35.1 pg — ABNORMAL HIGH (ref 26.0–34.0)
MCHC: 33.4 g/dL (ref 30.0–36.0)
MCV: 105 fL — ABNORMAL HIGH (ref 80.0–100.0)
Monocytes Absolute: 0.2 10*3/uL (ref 0.1–1.0)
Monocytes Relative: 4 %
Neutro Abs: 5 10*3/uL (ref 1.7–7.7)
Neutrophils Relative %: 89 %
Platelets: 137 10*3/uL — ABNORMAL LOW (ref 150–400)
RBC: 3.22 MIL/uL — ABNORMAL LOW (ref 3.87–5.11)
RDW: 13.6 % (ref 11.5–15.5)
WBC: 5.7 10*3/uL (ref 4.0–10.5)
nRBC: 0 % (ref 0.0–0.2)

## 2020-11-13 NOTE — Progress Notes (Signed)
  Radiation Oncology         [phone]) [phone] ________________________________  Name: Melissa [name] MRN: [id number]  Date: 10/02/2020  DOB: 1955/10/02  End of Treatment Note  Diagnosis:   65 yo woman with multiple myeloma and 3 areas of active involvement in the right humerus and left femur     Indication for treatment:  Palliative       Radiation treatment dates:   09/18/20 - 10/02/20  Site/dose:   The targets were treated to 20 Gy in 10 fractions to the proximal right humerus and two areas in the left femur.  Beams/energy:   A 3D field set-up was employed with 6 MV X-rays  Narrative: The patient tolerated radiation treatment relatively well and her pain had greatly improved near the end of treatment.   Plan: The patient has completed radiation treatment. The patient will return to radiation oncology clinic for routine followup in one month. I advised her to call or return sooner if she has any questions or concerns related to her recovery or treatment. ________________________________  Sheral Apley. [name], M.D.

## 2020-11-13 NOTE — Progress Notes (Signed)
Radiation Oncology         [phone] ________________________________  Name: Melissa [name] MRN: [id number]  Date: 11/14/2020  DOB: 31-Aug-1955  Post-treatment Follow-Up Note  CC: Kathyrn Drown, MD  Kathyrn Drown, MD  Diagnosis:   65 yo woman with multiple myeloma and 3 areas of active involvement in the right humerus and left femur    ICD-10-CM   1. Multiple myeloma in relapse ([place])  C90.02       Interval Since Last Radiation:  1 month 09/18/20 - 10/02/20: The targets were treated to 20 Gy in 10 fractions to the proximal right humerus and two areas in the left femur.  10/24/19 - 11/07/19:  The dural based lesion in the clivus was treated to 20 Gy in 10 fractions   1/25-06/05/19:  T4-T8 were treated to 20 Gy in 10 fractions of 2 Gy   05/05/2019: Lumbar spine and sacral metastases / 20 Gy in 5 fractions (Dr. Cristy Hilts)   03/27/19-04/07/19:  1. Left scapula and C7 - T2 / 20 Gy in 10 fractions 2. Right hip / 20 Gy in 10 fractions   10/2008: Palliative radiotherapy to T1-T7 [name])  Narrative:  I spoke with the patient to conduct her routine scheduled 1 month follow up visit via telephone to spare the patient unnecessary potential exposure in the healthcare setting during the current COVID-19 pandemic.  The patient was notified in advance and gave permission to proceed with this visit format.  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  Bone marrow assessment revealed a normal cellular marrow with no increase in plasma cells.  However, the PET/CT from 05/28/20 showed uptake in the right humerus with an SUV max of 12.9, previously 1.8 and a second lesion was seen in the distal left femur with SUV max of 4.7, previously 8.6 as well as a proximal left femur lesion with an SUV max of 3.9, previously 2.8.  Plain film x-rays of the left femur showed a small round lucency in the distal third femoral diaphysis (lateral view) felt to  reflect a lytic lesion corresponding to area of hypermetabolic activity on the PET/CT but the right humerus x-rays did not show any lytic lesions to correspond with the abnormality noted on PET.  She started maintenance therapy with cyclophosphamide and dexamethasone 10 mg weekly on 07/01/20 under the care and direction of Dr. Marcell Anger. Of note, she declined pomalidomide as part of maintenance regimen as she felt it contributed to prior febrile neutropenia and subsequent hospitalization in 05/2019. In May 2022, she started to notice right arm pain and repeat x-rays of the right humerus performed on 09/02/20 did show a small ill-defined lucency in the proximal humeral metaphysis, best seen on lateral view, and felt to correspond to the area of hypermetabolic activity on the PET/CT comparison. There was no acute fracture. On repeat x-rays of the left femur that same day, the previously described small round lucency in the distal third femoral diaphysisis was not well seen. The right arm pain was progressively worsening and she had repeat x-rays of the right humerus on 10/30/20 which re-demonstrated a small lucency in the proximal humeral metaphysis, relatively similar to prior exam. No new large lesion or associated fracture. Due to her persistent pain in the right arm, we met with her on 09/11/20 to discuss palliative radiotherapy as she has had an excellent response with her prior treatments.  She elected to proceed with 10  fractions of daily palliative radiation to the right humerus and left femur which was completed 10/02/20 and tolerated very well.  The pain in her right arm significantly improved throughout treatment. She presents today for her 1 month follow up visit post-treatment.    REVIEW OF SYSTEMS: On review of systems, the patient reports that she is doing well overall. She denies any chest pain, shortness of breath, cough, fevers, chills, night sweats, unintended weight changes. She denies any bowel or bladder  disturbances, and denies abdominal pain, nausea or vomiting. Her right arm pain is almost completely resolved at this point and she denies any new musculoskeletal or joint aches or pains, new skin lesions or concerns. A complete review of systems is obtained and is otherwise negative.                           ALLERGIES:  is allergic to hydromorphone hcl, poison ivy extract [poison ivy extract], and tape.  Meds: Current Outpatient Medications  Medication Sig Dispense Refill   acetaminophen (TYLENOL) 500 MG tablet Take 1 to 2   tablet(s) by mouth only as needed for pain *OCCASIONAL use only     acyclovir (ZOVIRAX) 800 MG tablet TAKE 1 TABLET BY MOUTH 2 TIMES DAILY 60 tablet 10   Calcium Carbonate-Vitamin D (CALCIUM + D PO) Take 1 tablet by mouth daily.     Cholecalciferol (VITAMIN D) 2000 UNITS tablet Take 2,000 Units by mouth daily.     cyclophosphamide (CYTOXAN) 50 MG capsule Take by mouth.     dexamethasone (DECADRON) 2 MG tablet Take by mouth.     dexamethasone (DECADRON) 4 MG tablet Take 10 tablets (40 mg total) by mouth once a week. 40 tablet 2   folic acid (FOLVITE) 1 MG tablet TAKE 1 TABLET (1 MG TOTAL) BY MOUTH DAILY. 90 tablet 0   folic acid (FOLVITE) 1 MG tablet TAKE 1 TABLET BY MOUTH DAILY. 90 tablet 0   hydrocortisone-pramoxine (ANALPRAM-HC) 2.5-1 % rectal cream Place 1 application rectally 3 (three) times daily. 30 g 1   levothyroxine (SYNTHROID) 75 MCG tablet TAKE 1 TABLET BY MOUTH EVERY DAY 90 tablet 0   loperamide (IMODIUM A-D) 2 MG tablet Take by mouth.     LORazepam (ATIVAN) 1 MG tablet TAKE 1 TABLET BY MOUTH EVERY 6 HOURS AS NEEDED FOR ANXIETY (OR ANTICIPATORY NAUSEA). 30 tablet 0   Multiple Vitamin (MULTI-VITAMIN) tablet Take 1 tablet by mouth daily.     omeprazole (PRILOSEC) 20 MG capsule Take 20 mg by mouth daily.     ondansetron (ZOFRAN) 8 MG tablet Take 1 tablet (8 mg) 30 minutes prior to cyclophosphamide dose on treatment days, and then take 1 tablet (8 mg) every 8  hours as needed for nausea or vomiting.     oxyCODONE (OXY IR/ROXICODONE) 5 MG immediate release tablet Take by mouth.     pantoprazole (PROTONIX) 40 MG tablet Take by mouth.     pantoprazole (PROTONIX) 40 MG tablet TAKE 1 TABLET BY MOUTH ONCE A DAY 90 tablet 3   pantoprazole (PROTONIX) 40 MG tablet Take 1 tablet (40 mg total) by mouth daily. 90 tablet 3   prochlorperazine (COMPAZINE) 10 MG tablet Take 1 tablet (10 mg total) by mouth every 6 (six) hours as needed for nausea or vomiting. 45 tablet 1   senna-docusate (SENOKOT-S) 8.6-50 MG tablet Take by mouth.      traMADol (ULTRAM) 50 MG tablet Take 50 mg by mouth  every 8 (eight) hours as needed.      triamcinolone cream (KENALOG) 0.1 % Apply to affected area twice daily as needed 45 g 2   vitamin B-12 (CYANOCOBALAMIN) 500 MCG tablet Take by mouth.     No current facility-administered medications for this visit.   Facility-Administered Medications Ordered in Other Visits  Medication Dose Route Frequency Provider Last Rate Last Admin   0.9 %  sodium chloride infusion   Intravenous Continuous Baird Cancer, PA-C 20 mL/hr at 12/05/10 0947 New Bag at 12/05/10 0947   0.9 %  sodium chloride infusion   Intravenous Continuous Everardo All, MD 20 mL/hr at 02/26/11 1528 New Bag at 02/26/11 1528   sodium chloride 0.9 % injection 10 mL  10 mL Intravenous PRN Everardo All, MD   10 mL at 02/26/11 1518    Physical Findings:  vitals were not taken for this visit. .  Unable to assess due to telephone follow up visit format.  Lab Findings: Lab Results  Component Value Date   WBC 5.7 11/11/2020   HGB 11.3 (L) 11/11/2020   HCT 33.8 (L) 11/11/2020   PLT 137 (L) 11/11/2020    Lab Results  Component Value Date   NA 139 03/02/2020   K 5.2 (H) 03/02/2020   CO2 27 03/02/2020   GLUCOSE 134 (H) 03/02/2020   BUN 10 03/02/2020   CREATININE 0.89 03/02/2020   BILITOT 0.4 03/02/2020   ALKPHOS 72 03/02/2020   AST 28 03/02/2020   ALT 16 03/02/2020    PROT 6.5 03/02/2020   ALBUMIN 3.7 03/02/2020   CALCIUM 9.6 03/02/2020   ANIONGAP 9 03/02/2020    Radiographic Findings: No results found.  Impression/Plan:  65 yo woman with multiple myeloma and 3 areas of active involvement in the right humerus and left femur She appears to have recovered well from the effects of her recent palliative radiotherapy and is currently without complaints. We discussed that while we are happy to continue to participate in her care if clinically indicated, at this point, we will plan to follow her progress via correspondence and are happy to see her back on an as-needed basis.  She will continue in routine follow-up under the direction of Dr. Marcell Anger and her neurosurgeon at [facility].  She knows that she is welcome to call at anytime with any questions or concerns related to her previous radiotherapy.  We look forward to continuing to follow her progress via correspondence.   Nicholos Johns, MMS, PA-C [place] [facility] [place]: [phone]  Fax: [phone]

## 2020-11-14 ENCOUNTER — Encounter: Payer: Self-pay | Admitting: Urology

## 2020-11-14 ENCOUNTER — Ambulatory Visit
Admission: RE | Admit: 2020-11-14 | Discharge: 2020-11-14 | Disposition: A | Discharge status: Home / Self Care | Payer: BC Managed Care – PPO | Source: Ambulatory Visit | Attending: Urology | Admitting: Urology

## 2020-11-14 ENCOUNTER — Other Ambulatory Visit: Payer: Self-pay

## 2020-11-14 DIAGNOSIS — C9002 Multiple myeloma in relapse: Secondary | ICD-10-CM

## 2020-11-14 NOTE — Progress Notes (Signed)
Spoke with patient via phone meaningful use complete. States she was started on Revlimid which has decreased the pain in her arm significantly. On 14 days then off for 14 days.

## 2020-11-18 ENCOUNTER — Other Ambulatory Visit (HOSPITAL_COMMUNITY)
Admission: RE | Admit: 2020-11-18 | Discharge: 2020-11-18 | Disposition: A | Discharge status: Home / Self Care | Payer: BC Managed Care – PPO | Source: Ambulatory Visit | Attending: Physician Assistant | Admitting: Physician Assistant

## 2020-11-18 DIAGNOSIS — C9000 Multiple myeloma not having achieved remission: Secondary | ICD-10-CM | POA: Insufficient documentation

## 2020-11-18 LAB — CBC WITH DIFFERENTIAL/PLATELET
Abs Immature Granulocytes: 0.05 10*3/uL (ref 0.00–0.07)
Basophils Absolute: 0 10*3/uL (ref 0.0–0.1)
Basophils Relative: 1 %
Eosinophils Absolute: 0.1 10*3/uL (ref 0.0–0.5)
Eosinophils Relative: 2 %
HCT: 33.1 % — ABNORMAL LOW (ref 36.0–46.0)
Hemoglobin: 10.9 g/dL — ABNORMAL LOW (ref 12.0–15.0)
Immature Granulocytes: 1 %
Lymphocytes Relative: 12 %
Lymphs Abs: 0.4 10*3/uL — ABNORMAL LOW (ref 0.7–4.0)
MCH: 35.5 pg — ABNORMAL HIGH (ref 26.0–34.0)
MCHC: 32.9 g/dL (ref 30.0–36.0)
MCV: 107.8 fL — ABNORMAL HIGH (ref 80.0–100.0)
Monocytes Absolute: 0.3 10*3/uL (ref 0.1–1.0)
Monocytes Relative: 9 %
Neutro Abs: 2.6 10*3/uL (ref 1.7–7.7)
Neutrophils Relative %: 75 %
Platelets: 127 10*3/uL — ABNORMAL LOW (ref 150–400)
RBC: 3.07 MIL/uL — ABNORMAL LOW (ref 3.87–5.11)
RDW: 15.1 % (ref 11.5–15.5)
WBC: 3.5 10*3/uL — ABNORMAL LOW (ref 4.0–10.5)
nRBC: 0 % (ref 0.0–0.2)

## 2020-12-09 ENCOUNTER — Other Ambulatory Visit (HOSPITAL_COMMUNITY)
Admission: RE | Admit: 2020-12-09 | Discharge: 2020-12-09 | Disposition: A | Discharge status: Home / Self Care | Payer: BC Managed Care – PPO | Source: Ambulatory Visit | Attending: Physician Assistant | Admitting: Physician Assistant

## 2020-12-09 DIAGNOSIS — C9000 Multiple myeloma not having achieved remission: Secondary | ICD-10-CM | POA: Insufficient documentation

## 2020-12-09 LAB — CBC WITH DIFFERENTIAL/PLATELET
Abs Immature Granulocytes: 0.04 10*3/uL (ref 0.00–0.07)
Basophils Absolute: 0 10*3/uL (ref 0.0–0.1)
Basophils Relative: 1 %
Eosinophils Absolute: 0.1 10*3/uL (ref 0.0–0.5)
Eosinophils Relative: 2 %
HCT: 33.5 % — ABNORMAL LOW (ref 36.0–46.0)
Hemoglobin: 11.3 g/dL — ABNORMAL LOW (ref 12.0–15.0)
Immature Granulocytes: 1 %
Lymphocytes Relative: 7 %
Lymphs Abs: 0.3 10*3/uL — ABNORMAL LOW (ref 0.7–4.0)
MCH: 37.2 pg — ABNORMAL HIGH (ref 26.0–34.0)
MCHC: 33.7 g/dL (ref 30.0–36.0)
MCV: 110.2 fL — ABNORMAL HIGH (ref 80.0–100.0)
Monocytes Absolute: 0.1 10*3/uL (ref 0.1–1.0)
Monocytes Relative: 2 %
Neutro Abs: 3.6 10*3/uL (ref 1.7–7.7)
Neutrophils Relative %: 87 %
Platelets: 128 10*3/uL — ABNORMAL LOW (ref 150–400)
RBC: 3.04 MIL/uL — ABNORMAL LOW (ref 3.87–5.11)
RDW: 15.1 % (ref 11.5–15.5)
WBC: 4.1 10*3/uL (ref 4.0–10.5)
nRBC: 0 % (ref 0.0–0.2)

## 2020-12-17 ENCOUNTER — Other Ambulatory Visit (HOSPITAL_COMMUNITY)
Admission: RE | Admit: 2020-12-17 | Discharge: 2020-12-17 | Disposition: A | Discharge status: Home / Self Care | Payer: BC Managed Care – PPO | Source: Ambulatory Visit | Attending: Physician Assistant | Admitting: Physician Assistant

## 2020-12-17 ENCOUNTER — Other Ambulatory Visit: Payer: Self-pay

## 2020-12-17 DIAGNOSIS — C9000 Multiple myeloma not having achieved remission: Secondary | ICD-10-CM | POA: Insufficient documentation

## 2020-12-17 LAB — CBC WITH DIFFERENTIAL/PLATELET
Abs Immature Granulocytes: 0.03 10*3/uL (ref 0.00–0.07)
Basophils Absolute: 0 10*3/uL (ref 0.0–0.1)
Basophils Relative: 0 %
Eosinophils Absolute: 0 10*3/uL (ref 0.0–0.5)
Eosinophils Relative: 1 %
HCT: 32.7 % — ABNORMAL LOW (ref 36.0–46.0)
Hemoglobin: 11.3 g/dL — ABNORMAL LOW (ref 12.0–15.0)
Immature Granulocytes: 1 %
Lymphocytes Relative: 20 %
Lymphs Abs: 0.7 10*3/uL (ref 0.7–4.0)
MCH: 37.9 pg — ABNORMAL HIGH (ref 26.0–34.0)
MCHC: 34.6 g/dL (ref 30.0–36.0)
MCV: 109.7 fL — ABNORMAL HIGH (ref 80.0–100.0)
Monocytes Absolute: 0.3 10*3/uL (ref 0.1–1.0)
Monocytes Relative: 10 %
Neutro Abs: 2.3 10*3/uL (ref 1.7–7.7)
Neutrophils Relative %: 68 %
Platelets: 122 10*3/uL — ABNORMAL LOW (ref 150–400)
RBC: 2.98 MIL/uL — ABNORMAL LOW (ref 3.87–5.11)
RDW: 15.9 % — ABNORMAL HIGH (ref 11.5–15.5)
WBC: 3.3 10*3/uL — ABNORMAL LOW (ref 4.0–10.5)
nRBC: 0 % (ref 0.0–0.2)

## 2021-01-06 ENCOUNTER — Other Ambulatory Visit (HOSPITAL_COMMUNITY)
Admission: RE | Admit: 2021-01-06 | Discharge: 2021-01-06 | Disposition: A | Discharge status: Home / Self Care | Payer: BC Managed Care – PPO | Source: Ambulatory Visit | Attending: Physician Assistant | Admitting: Physician Assistant

## 2021-01-06 DIAGNOSIS — C9000 Multiple myeloma not having achieved remission: Secondary | ICD-10-CM | POA: Insufficient documentation

## 2021-01-06 LAB — CBC WITH DIFFERENTIAL/PLATELET
Abs Immature Granulocytes: 0.04 10*3/uL (ref 0.00–0.07)
Basophils Absolute: 0 10*3/uL (ref 0.0–0.1)
Basophils Relative: 1 %
Eosinophils Absolute: 0.2 10*3/uL (ref 0.0–0.5)
Eosinophils Relative: 7 %
HCT: 35.1 % — ABNORMAL LOW (ref 36.0–46.0)
Hemoglobin: 11.8 g/dL — ABNORMAL LOW (ref 12.0–15.0)
Immature Granulocytes: 2 %
Lymphocytes Relative: 11 %
Lymphs Abs: 0.3 10*3/uL — ABNORMAL LOW (ref 0.7–4.0)
MCH: 36.8 pg — ABNORMAL HIGH (ref 26.0–34.0)
MCHC: 33.6 g/dL (ref 30.0–36.0)
MCV: 109.3 fL — ABNORMAL HIGH (ref 80.0–100.0)
Monocytes Absolute: 0.3 10*3/uL (ref 0.1–1.0)
Monocytes Relative: 10 %
Neutro Abs: 1.9 10*3/uL (ref 1.7–7.7)
Neutrophils Relative %: 69 %
Platelets: 135 10*3/uL — ABNORMAL LOW (ref 150–400)
RBC: 3.21 MIL/uL — ABNORMAL LOW (ref 3.87–5.11)
RDW: 14.5 % (ref 11.5–15.5)
WBC: 2.7 10*3/uL — ABNORMAL LOW (ref 4.0–10.5)
nRBC: 0 % (ref 0.0–0.2)

## 2021-01-19 ENCOUNTER — Other Ambulatory Visit: Payer: Self-pay | Admitting: Family Medicine

## 2021-01-27 ENCOUNTER — Other Ambulatory Visit: Payer: Self-pay | Admitting: Family Medicine

## 2021-01-31 ENCOUNTER — Other Ambulatory Visit: Payer: Self-pay | Admitting: Nurse Practitioner

## 2021-02-10 ENCOUNTER — Telehealth: Payer: Self-pay

## 2021-02-10 NOTE — Telephone Encounter (Signed)
Transition Care Management Follow-up Telephone Call Date of discharge and from where: 02/06/2021  [facility] How have you been since you were released from the hospital? "Doing good" Any questions or concerns? No  Items Reviewed: Did the pt receive and understand the discharge instructions provided? Yes  Medications obtained and verified? Yes  Other? No  Any new allergies since your discharge? No  Dietary orders reviewed? Yes Do you have support at home? Yes   Home Care and Equipment/Supplies: Were home health services ordered? not applicable If so, what is the name of the agency?  Has the agency set up a time to come to the patient's home? not applicable Were any new equipment or medical supplies ordered?  No What is the name of the medical supply agency?  Were you able to get the supplies/equipment? not applicable Do you have any questions related to the use of the equipment or supplies? No  Functional Questionnaire: (I = Independent and D = Dependent) ADLs: I  Bathing/Dressing- I  Meal Prep- I  Eating- I  Maintaining continence- I  Transferring/Ambulation- I  Managing Meds- I  Follow up appointments reviewed:  PCP Hospital f/u appt confirmed? No   Specialist Hospital f/u appt confirmed?  Scheduled to see Oncology on 02/11/2021  Are transportation arrangements needed? No  If their condition worsens, is the pt aware to call PCP or go to the Emergency Dept.? Yes Was the patient provided with contact information for the PCP's office or ED? Yes Was to pt encouraged to call back with questions or concerns? Yes  Tomasa Rand, RN, BSN, CEN [facility] [employer] [phone]

## 2021-02-26 NOTE — Telephone Encounter (Signed)
Appointment on 11/4 for medication follow up

## 2021-02-27 ENCOUNTER — Telehealth: Payer: Self-pay | Admitting: Radiation Oncology

## 2021-02-27 ENCOUNTER — Ambulatory Visit
Admission: RE | Admit: 2021-02-27 | Discharge: 2021-02-27 | Disposition: A | Discharge status: Home / Self Care | Payer: Self-pay | Source: Ambulatory Visit | Attending: Radiation Oncology | Admitting: Radiation Oncology

## 2021-02-27 ENCOUNTER — Other Ambulatory Visit: Payer: Self-pay | Admitting: Radiation Oncology

## 2021-02-27 DIAGNOSIS — C9002 Multiple myeloma in relapse: Secondary | ICD-10-CM

## 2021-02-27 NOTE — Telephone Encounter (Signed)
Received dosimetry request from [facility] via fax on 02/25/21. Placed on cart for dosimetry on 02/27/21.

## 2021-02-28 ENCOUNTER — Other Ambulatory Visit: Payer: Self-pay

## 2021-02-28 ENCOUNTER — Encounter: Payer: Self-pay | Admitting: Family Medicine

## 2021-02-28 ENCOUNTER — Ambulatory Visit: Payer: BC Managed Care – PPO | Admitting: Family Medicine

## 2021-02-28 VITALS — BP 100/60 | HR 119 | Temp 97.3°F | Ht 62.0 in | Wt 104.0 lb

## 2021-02-28 DIAGNOSIS — E039 Hypothyroidism, unspecified: Secondary | ICD-10-CM

## 2021-02-28 MED ORDER — LEVOTHYROXINE SODIUM 75 MCG PO TABS: 75.0000 ug | ORAL_TABLET | Freq: Every day | ORAL | 3 refills | Status: AC

## 2021-02-28 NOTE — Progress Notes (Signed)
   Subjective:    Patient ID: Melissa [name], female    DOB: [birth year], 65 y.o.   MRN: [id number]  HPI Patient is here for a follow up on medications - refill on thyroid medication requested Currently undergoing cancer Tx clinical trials for next 5 to 6 months- has some fatigue Has underlying thyroid issues takes medication recent lab work looked good  Review of Systems     Objective:   Physical Exam General-in no acute distress Eyes-no discharge Lungs-respiratory rate normal, CTA CV-no murmurs,RRR Extremities skin warm dry no edema Neuro grossly normal Behavior normal, alert        Assessment & Plan:  Patient going through a tough battle with cancer.  Hopefully there will be some improvement with the treatments being given to her currently  As for her thyroid under good control recent labs through cancer center look good.  1 year refill given she will notify us if any health issues that we can help her with otherwise we will see her in 1 year

## 2021-03-04 ENCOUNTER — Other Ambulatory Visit: Payer: Self-pay

## 2021-03-04 ENCOUNTER — Ambulatory Visit
Admission: RE | Admit: 2021-03-04 | Discharge: 2021-03-04 | Disposition: A | Discharge status: Home / Self Care | Payer: BC Managed Care – PPO | Source: Ambulatory Visit | Attending: Urology | Admitting: Urology

## 2021-03-04 ENCOUNTER — Encounter: Payer: Self-pay | Admitting: Urology

## 2021-03-04 ENCOUNTER — Ambulatory Visit
Admission: RE | Admit: 2021-03-04 | Discharge: 2021-03-04 | Disposition: A | Discharge status: Home / Self Care | Payer: BC Managed Care – PPO | Source: Ambulatory Visit | Attending: Radiation Oncology | Admitting: Radiation Oncology

## 2021-03-04 VITALS — BP 95/53 | HR 113 | Temp 97.8°F | Resp 18 | Ht 63.0 in | Wt 103.0 lb

## 2021-03-04 DIAGNOSIS — C9002 Multiple myeloma in relapse: Secondary | ICD-10-CM

## 2021-03-04 DIAGNOSIS — C9000 Multiple myeloma not having achieved remission: Secondary | ICD-10-CM

## 2021-03-04 NOTE — Progress Notes (Addendum)
Patient reports RT upper arm pain 7/10, reduced appetite, dizziness, visual disturbances, and fatigue. No other symptoms reported at this time.  Meaningful use complete.  Postmenopausal. No chances of pregnancy.  BP (!) 95/53 (BP Location: Left Arm, Patient Position: Sitting, Cuff Size: Normal)   Pulse (!) 113   Temp 97.8 F (36.6 C) (Oral)   Resp 18   Ht 5\' 3"  (1.6 m)   Wt 103 lb (46.7 kg)   SpO2 100%   BMI 18.25 kg/m   Patient states "low BP is normal for her."

## 2021-03-04 NOTE — Progress Notes (Signed)
Radiation Oncology         [phone] ________________________________  Name: Melissa [name] MRN: [id number]  Date: 03/04/2021  DOB: [birth year]  Re-Consultation Note  CC: Kathyrn Drown, MD  Rosary Lively, MD  Diagnosis:  65 y.o. woman with significant progression of multiple myeloma involving multiple painful lesions in the spine and right humerus.    ICD-10-CM   1. Multiple myeloma not having achieved remission [facility])  C90.00 Ambulatory referral to Social Work      Interval Since Last Radiation:  5 months 09/18/20 - 10/02/20: The targets were treated to 20 Gy in 10 fractions to the proximal right humerus and two areas in the left femur.  10/24/19 - 11/07/19:  The dural based lesion in the clivus was treated to 20 Gy in 10 fractions   05/22/19-06/05/19:  T4-T8 were treated to 20 Gy in 10 fractions of 2 Gy   05/05/2019: Lumbar spine and sacral metastases / 20 Gy in 5 fractions (Dr. Cristy Hilts)   03/27/19-04/07/19:  1. Left scapula and C7 - T2 / 20 Gy in 10 fractions 2. Right hip / 20 Gy in 10 fractions   10/2008: Palliative radiotherapy to T1-T7 [name])  Narrative:  Melissa [name] returns today for re-consultation to discuss management of a painful sites of progressive multiple myeloma in the spine and right humerus. She is well known to our service, having received multiple courses of palliative radiotherapy to the involved osseous areas as noted above.  In summary, she was initially diagnosed with multiple myeloma in 09/2008 after presenting with right-sided upper back pain. She had been followed by Dr. Delton Coombes with the [facility] and by Dr. Norma Fredrickson with the [facility]. She was initially treated with autologous stem cell transplant in 02/2009 followed by post transplant lenalidomide which was discontinued in 01/2013 anfter 3.5+ years of maintenance therapy and remained in remission until 2020.  Her post recurrance treatment history is as below:   02/08/2019 - Laboratory studies showed an M-spike of 0.4 and repeat studies from 03/13/2019 was 1.94.  03/13/2019 - PET scan showed multiple FDG-avid lytic lesions. This confirmed relapse disease and salvage therapy based on cytogenetics was offered (carfilzomib, pomalidomide, dex for high risk disease and daratumumab vs pomalidomide dex for standard risk). Goal of salvage discussed with obtaining remission followed by second transplant as she has cells stored. -03/27/2019 - Started XRT to left scapula and upper T-spine with 20 Gy in 10 fractions, right hip with 20 Gy in 10 fractions.  06/04/2018 - Started treatment with carfilzomib 20 mg/m2 (D1-2, 8-9, 15-16) and dexamethasone 40 mg (weekly); pomalidomide 4 mg (D1-21) was delayed due to insurance issues.  05/02/2019 - She developed worsening pain and paresthesias. MRI at an outside facility detected new epidural tumors with concern for spinal cord compression. She was admitted to [facility] for further management and underwent T6 laminectomy and posterior fusion of T4-T8 vertebrae.   05/05/2019 - Started XRT to L1, L3-4, L5, S1-3 with 5 fractions to 2000 cGy.  05/18/2019 - Restarted systemic therapy with carfilzomib 20 mg/m2 (D1, increased to 56 mg/m2 D8/15), pomalidomide 2 mg (D1-21), and dexamethasone 40 mg (D1/8/15) of each 28-day cycle.  05/22/2019 - Started XRT to T4-T8 levels with 10 fractions in 2000 cGy.  05/25/2019 - All treatment held due to persistent pancytopenia secondary to XRT of the spine.  06/22/2019 - Restarted treatment with C2D1 of carfilzomib, pomalidomide, and dexamethasone as above. Myeloma serology showed disease progression attributed to delays  in therapy due to cytopenias rather than refractory to current line of therapy.  06/24/2019 - 06/26/2019: Hospitalized for non-neutropenic fever, treatment held.  07/04/2019 - Transitioned to SQ daratumumab 1800 mg (weekly), carfilzomib 56 mg/m2 (D1/8/15), dexamethasone 40 mg (weekly)  of each 28-day cycle.  07/31/2019 - C2D1: Continued SQ daratumumab 1800 mg (weekly), carfilzomib reduced to 45 mg/m2 (D1/8/15), dexamethasone 40 mg (weekly) of each 28-day cycle.  08/07/2019: Carfilzomib delayed x 1 week due to cytopenias.  09/05/2019 - C3D1: SQ daratumumab 1800 mg (D1/15), carfilzomib 45 mg/m2 (D1/15), dexamethasone 40 mg (D1/15) of each 28-day cycle.  09/30/2019 - 10/05/2019: Treatment held, admitted for acute onset diplopia. MRI revealed 4.4 x 1.1 cm dural-based mass overlying the right parietal convexity, as well as T1 hypointense signal within basiocciput and clivus as well as visualized upper cervical spine. Treated with IV dexamethasone and underwent open biopsy with craniotomy on 10/03/2019. Biopsy of epidural tumor confirmed malignant plasma cells with brisk mitotic activity. 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  10/16/2019 - C4D1: Resumed treatment with SQ daratumumab 1800 mg (D1/15), carfilzomib 45 mg/m2 (D1/15), and dexamethasone 20 mg (D1/15) of each 28-day cycle.  10/24/2019: Started XRT to clivus, base of skull lesion, and C4-5 lesion with 20 Gy in 10 fractions.  11/14/2019: Underwent lumbar puncture with CSF RBCs 109, CSF protein 60 mg/dL, CSF glucose 92 mg/dL, insufficient cells for flow cytometry, though erythrocytes and plasmacytoid cells identified on cytospin.  11/15/2019: PET/CT scan revealed extensive extraosseous progressive disease.  11/21/2019 - C1D1: Admitted for VDCEP with bortezomib 1.3 mg/m2 (D2, D5), dexamethasone 40 mg (D1-4), cisplatin 10 mg/m2 (D1-4), cyclophosphamide 400 mg/m2 (D1-4), etoposide 40 mg/m2 (D1-4).  11/24/2019: Given presence of plasmacytoid cells on cytospin, underwent Ommaya placement.  11/25/2019: Received IT cytarabine 40 mg via Ommaya.  11/27/2019 - 12/10/2019: Admitted for fever of unknown origin (started  08/01). Infectious work-up overall negative. CSF studies collected 08/02 showed pleocytosis with WBC 263 (100% polynuclear), glucose 71, protein 48, Gram stain negative, CSF culture NGTD. Repeat CSF studies collected 08/04 showed resolution of pleocytosis with WBC 7, RBCs 286, glucose 64, protein 37, Gram stain and CSF culture NGTD. Developed aphasia on 08/04 attributed to acute toxic metabolic encephalopathy, symptoms resolved by 08/07. Treated with IV antibiotics given concern for possible catheter-related ventriculitis. Neurosurgery felt there was no definitive evidence of CNS infection, Ommaya was not removed. Final differential diagnosis included chemical meningitis due to recent IT cytarabine administration, inflammation, or other source. Received Zarxio from 08/02 - 08/15 as she nadir-ed while inpatient from recent cycle of VDCEP.  12/14/2019: Received IT cytarabine 40 mg via Ommaya.  12/25/2019 - C2D1: Admitted for VDCEP with 25% dose reduction due to prior prolonged neutropenia - bortezomib 1 mg/m2 (D2, D5), dexamethasone 40 mg (D1-4), cisplatin 7.5 mg/m2 (D1-4), cyclophosphamide 300 mg/m2 (D1-4), etoposide 30 mg/m2 (D1-4).   12/26/2019: Received IT cytarabine 40 mg via Ommaya.  01/12/2020: Received IT cytarabine 40 mg via Ommaya.  AUTOLOGOUS STEM CELL TRANSPLANT #2:  Preparative Regimen: Melphalan 140 mg/m2  Day 0 =02/13/2020   G-CSF was begun on day +5.   Post-transplant, her hospital course was complicated by mucositis requiring IV pain control, neutropenic fever and placed on broad-spectrum antibiotics, and diarrhea. All cultures were negative; however.   As her counts recovered, her temperatures defervesced and her antibiotics were discontinued. On the day of discharge, her total white count was 6400 with  a hemoglobin of 7.5 and platelet count of 36,000. Serum electrolytes were low and she was discharged with oral supplementation.  WBC engraftment: 02/24/2020  Platelets  engraftment: 03/02/2020  Last platelet transfusion: 02/24/2020  POST TRANSPLANT COURSE:  05/31/2020: Day +100 evaluation revealed normocellular marrow (40-50%) with no increase in plasma cells, MRD negative (0.0000%). PET/CT with uptake in right humerus (SUV max 12.9, previously 1.8), distal left femur (SUV max 4.7, previously 8.6), and proximal left femur (SUV max 3.9, previously 2.8).  07/01/2020: Started maintenance therapy with cyclophosphamide 200 mg D1-4 and dexamethasone 10 mg weekly of each 21-day cycle. Of note, she declined pomalidomide as part of maintenance regimen as she felt it contributed to prior febrile neutropenia and subsequent hospitalization in 05/2019.  07/24/2020: C2D1 (same as C1)  08/12/2020: C3D1 (same as C1)  09/10/2020: C4D1 (same as C1); extending cycle to 28 days to allow further count recovery, thus this cycle was delayed an extra week.  09/18/2020 - 10/02/2020: Underwent XRT with 20 Gy in 10 fractions to right proximal humerus and two areas in the left femur.  10/07/2020: C5D1 (same as C4)  11/04/2020: C6D1 - added lenalidomide 10 mg D1-14 to existing regimen due to chemical relapse and worsening right humeral pain.  12/02/2020: C7D1 (same as C6)  12/30/2020: C8D1 (same as C6)  CURRENT REGIMEN:  02/04/2021: C1D1 - [id number]-383B @ 20,000 mcg ([id number]).  She was last seen here for follow up on 11/14/20 after receiving palliative radiation to the proximal right humerus and the left femur. The right humerus pain was significantly improved and she was having no pain in the left femur even prior to radiation.   She underwent restaging PET on 01/30/21, which showed disease progression with a significant increase in the number of involved lesions. The new hypermetabolic index lesions are noted in the right humeral diaphysis with adjacent 4 cm soft tissue mass within the anterior arm; 4.7 cm right axilla soft tissue mass; lesion with L1 vertebral body; lesion  within right iliac body. Increased FDG-avidity was also noted in the left femoral diaphysis lesion. Of note on 02/04/21, she was started on clinical trial [id number] and began treatment with [id number]-383B. She has continued to have discomfort in the right upper extremity with increased size of palpable mass despite initial relief with treatment. She also has pain at the base of her neck correlating with the C6 lesion, as well as discomfort/weakness in the bilateral lower extremities, likely associated with disease involvement in the thoracic/lumbar spine.  She has been kindly referred back to [place] today for consideration of palliative radiation therapy to the painful sites of disease in the right humerus, cervical spine and lumbar spine.   ALLERGIES:  is allergic to hydromorphone hcl, poison ivy extract [poison ivy extract], and tape.  Meds: Current Outpatient Medications  Medication Sig Dispense Refill   acetaminophen (TYLENOL) 500 MG tablet Take 1 to 2   tablet(s) by mouth only as needed for pain *OCCASIONAL use only     acyclovir (ZOVIRAX) 800 MG tablet TAKE 1 TABLET TWICE A DAY 180 tablet 2   Calcium Carbonate-Vitamin D (CALCIUM + D PO) Take 1 tablet by mouth daily.     Cholecalciferol (VITAMIN D) 2000 UNITS tablet Take 2,000 Units by mouth daily.     cyclophosphamide (CYTOXAN) 50 MG capsule Take by mouth.     dexamethasone (DECADRON) 2 MG tablet Take by mouth.     dexamethasone (DECADRON) 4 MG tablet Take 10 tablets (40 mg total) by  mouth once a week. 40 tablet 2   folic acid (FOLVITE) 1 MG tablet TAKE 1 TABLET (1 MG TOTAL) BY MOUTH DAILY. 90 tablet 0   folic acid (FOLVITE) 1 MG tablet TAKE 1 TABLET BY MOUTH DAILY. 90 tablet 0   hydrocortisone-pramoxine (ANALPRAM-HC) 2.5-1 % rectal cream Place 1 application rectally 3 (three) times daily. 30 g 1   levothyroxine (SYNTHROID) 75 MCG tablet Take 1 tablet (75 mcg total) by mouth daily. 90 tablet 3   loperamide (IMODIUM A-D) 2 MG tablet Take by mouth.      Multiple Vitamin (MULTI-VITAMIN) tablet Take 1 tablet by mouth daily.     ondansetron (ZOFRAN) 8 MG tablet Take 1 tablet (8 mg) 30 minutes prior to cyclophosphamide dose on treatment days, and then take 1 tablet (8 mg) every 8 hours as needed for nausea or vomiting.     oxyCODONE (OXY IR/ROXICODONE) 5 MG immediate release tablet Take by mouth.     prochlorperazine (COMPAZINE) 10 MG tablet Take 1 tablet (10 mg total) by mouth every 6 (six) hours as needed for nausea or vomiting. 45 tablet 1   REVLIMID 10 MG capsule Take 10 mg by mouth daily. 14 days then 14 days off     senna-docusate (SENOKOT-S) 8.6-50 MG tablet Take by mouth.      traMADol (ULTRAM) 50 MG tablet Take 50 mg by mouth every 8 (eight) hours as needed.      triamcinolone cream (KENALOG) 0.1 % Apply to affected area twice daily as needed 45 g 2   vitamin B-12 (CYANOCOBALAMIN) 500 MCG tablet Take by mouth.     No current facility-administered medications for this encounter.   Facility-Administered Medications Ordered in Other Encounters  Medication Dose Route Frequency Provider Last Rate Last Admin   0.9 %  sodium chloride infusion   Intravenous Continuous Baird Cancer, PA-C 20 mL/hr at 12/05/10 0947 New Bag at 12/05/10 0947   0.9 %  sodium chloride infusion   Intravenous Continuous Everardo All, MD 20 mL/hr at 02/26/11 1528 New Bag at 02/26/11 1528   sodium chloride 0.9 % injection 10 mL  10 mL Intravenous PRN Everardo All, MD   10 mL at 02/26/11 1518    Physical Findings: The patient is in no acute distress. Patient is alert and oriented.  height is 5' 3"  (1.6 m) and weight is 103 lb (46.7 kg). Her oral temperature is 97.8 F (36.6 C). Her blood pressure is 95/53 (abnormal) and her pulse is 113 (abnormal). Her respiration is 18 and oxygen saturation is 100%. .  No significant changes.  She does localize pain and an enlarging mass in the right proximal humerus as well as pain at the base of her neck and in her low  back, radiating into the bilateral thighs laterally.  Lab Findings: Lab Results  Component Value Date   WBC 2.7 (L) 01/06/2021   HGB 11.8 (L) 01/06/2021   HCT 35.1 (L) 01/06/2021   PLT 135 (L) 01/06/2021    Lab Results  Component Value Date   NA 139 03/02/2020   K 5.2 (H) 03/02/2020   CO2 27 03/02/2020   GLUCOSE 134 (H) 03/02/2020   BUN 10 03/02/2020   CREATININE 0.89 03/02/2020   BILITOT 0.4 03/02/2020   ALKPHOS 72 03/02/2020   AST 28 03/02/2020   ALT 16 03/02/2020   PROT 6.5 03/02/2020   ALBUMIN 3.7 03/02/2020   CALCIUM 9.6 03/02/2020   ANIONGAP 9 03/02/2020    Radiographic Findings: No  results found.   Impression/Plan:  65 y.o. woman with significant progression of multiple myeloma involving multiple painful lesions in the spine and right humerus.  Today, we talked to the patient and her husband about the findings and work-up thus far.  We discussed the natural history of myeloma and general treatment, highlighting the role of focused palliative radiotherapy in the management of painful sites of disease.  We discussed the available radiation techniques, and focused on the details of logistics and delivery.  We reviewed the anticipated acute and late sequelae associated with radiation in this setting. The patient was encouraged to ask questions that were answered to their stated satisfaction.   At the end of our discussion, the patient would like to proceed with the recommended 2 week course of palliative radiotherapy to the right humerus lesion, C5-T4, and T10-L5. She has freely signed written consent to proceed today in the office and a copy o this document will be placed in her medical record. She will proceed with CT SIM/treatment planning following our visit today in anticipation of beginning her daily treatments in the near future. We enjoyed meeting with her again today and look forward to continuing to participate in her care. We will share our discussion with her  treatment team at [facility] and proceed with treatment accordingly. She knows that she is welcome to call with any questions or concerns in the interim.  We personally spent 60 minutes in this encounter including chart review, reviewing radiological studies, meeting face-to-face with the patient, entering orders and completing documentation.    Nicholos Johns, PA-C    Tyler Pita, MD  [facility] Direct Dial: [phone]  Fax: [phone] [email or web address]  Skype  [email or web address]   This document serves as a record of services personally performed by Tyler Pita, MD and Freeman Caldron, PA-C. It was created on their behalf by Wilburn Mylar, a trained medical scribe. The creation of this record is based on the scribe's personal observations and the provider's statements to them. This document has been checked and approved by the attending provider.

## 2021-03-05 ENCOUNTER — Encounter: Payer: Self-pay | Admitting: *Deleted

## 2021-03-05 NOTE — Progress Notes (Signed)
[facility] Psychosocial Distress Screening Clinical Social Work  Clinical Social Work was referred by distress screening protocol.  The patient scored a 7 on the Psychosocial Distress Thermometer which indicates moderate distress. Clinical Social Worker contacted patient by phone to assess for distress and other psychosocial needs.   Patient's spouse answered phone- he reported patient is in significant pain.  She recently took pain medication and it "knocks her out all day".  CSW briefly reviewed CSW role with patient's spouse. CSW provided contact information. Patient and spouse will follow up with CSW as needed.  ONCBCN DISTRESS SCREENING 03/04/2021  Screening Type   Distress experienced in past week (1-10) 7  Emotional problem type Nervousness/Anxiety;Adjusting to illness    Clinical Social Worker follow up needed: No.  If yes, follow up plan:  Gwinda Maine, LCSW

## 2021-03-06 NOTE — Addendum Note (Signed)
Encounter addended by: Tyler Pita, MD on: 03/06/2021 11:44 AM  Actions taken: Medication List reviewed, Problem List reviewed, Allergies reviewed, Clinical Note Signed

## 2021-03-06 NOTE — Progress Notes (Addendum)
  Radiation Oncology         [phone] ________________________________  Name: Melissa [name] MRN: [id number]  Date: 03/04/2021  DOB: 1956/04/09  SIMULATION AND TREATMENT PLANNING NOTE    ICD-10-CM   1. Multiple myeloma in relapse ([place])  C90.02       DIAGNOSIS:  65 y.o. woman with significant progression of multiple myeloma involving multiple painful lesions in the spine and right humerus.  NARRATIVE:  The patient was brought to the [place].  Identity was confirmed.  All relevant records and images related to the planned course of therapy were reviewed.  The patient freely provided informed written consent to proceed with treatment after reviewing the details related to the planned course of therapy. The consent form was witnessed and verified by the simulation staff.  Then, the patient was set-up in a stable reproducible  supine position for radiation therapy.  CT images were obtained.  Surface markings were placed.  The CT images were loaded into the planning software.  Then the target and avoidance structures were contoured.  Treatment planning then occurred.  The radiation prescription was entered and confirmed.  Then, I designed and supervised the construction of a total of at least six medically necessary complex treatment devices specified in the separate radiation plan.  I have requested : 3D Simulation  I have requested a DVH of the following structures: left kidney, right kidney, and spinal cord.  SPECIAL TREATMENT PROCEDURE:  The planned course of therapy using radiation constitutes a special treatment procedure. Special care is required in the management of this patient for the following reasons. This treatment overlaps with previous thoracic radiotherapy: [ Retreatment in a previously radiated area requiring careful monitoring of increased risk of toxicity due to overlap of previous treatment..  The special nature of the planned course of radiotherapy will  require increased physician supervision and oversight to ensure patient's safety with optimal treatment outcomes.  This will require extended time and effort from me.  PLAN:  The patient will receive 20 Gy in 10 fractions.  ________________________________  Sheral Apley [name], M.D.

## 2021-03-07 DIAGNOSIS — C9000 Multiple myeloma not having achieved remission: Secondary | ICD-10-CM | POA: Diagnosis not present

## 2021-03-07 NOTE — Addendum Note (Signed)
Encounter addended by: Tyler Pita, MD on: 03/07/2021 1:14 PM  Actions taken: Medication List reviewed, Problem List reviewed, Allergies reviewed, Clinical Note Signed

## 2021-03-11 ENCOUNTER — Ambulatory Visit: Payer: BC Managed Care – PPO | Admitting: Radiation Oncology

## 2021-03-11 ENCOUNTER — Encounter: Payer: Self-pay | Admitting: Urology

## 2021-03-11 NOTE — Progress Notes (Signed)
We received a call from this patient's treatment team at [facility] saying that [name] will be going into hospice care. Her pain is very severe at this time and she has asked that we cancel her appts.  She was scheduled to begin treatments this afternoon so I have shared this information with dosimetry, [facility] and Dr. [name] so that all are aware.  Nicholos Johns, MMS, PA-C [facility] [place]: [phone]  Fax: [phone]

## 2021-03-12 ENCOUNTER — Ambulatory Visit: Payer: BC Managed Care – PPO

## 2021-03-12 ENCOUNTER — Telehealth: Payer: Self-pay | Admitting: Family Medicine

## 2021-03-12 NOTE — Telephone Encounter (Signed)
Please advise. Thank you

## 2021-03-12 NOTE — Telephone Encounter (Signed)
Left message to return call 

## 2021-03-12 NOTE — Telephone Encounter (Signed)
[facility] [place] called and asked if Dr. Nicki Reaper would be patients' attending physician while she is in hospice. Please advise.  CB#:  [phone]  ext [phone]

## 2021-03-12 NOTE — Telephone Encounter (Signed)
Yes we will;

## 2021-03-13 ENCOUNTER — Ambulatory Visit: Payer: BC Managed Care – PPO

## 2021-03-14 ENCOUNTER — Ambulatory Visit: Payer: BC Managed Care – PPO

## 2021-03-14 NOTE — Telephone Encounter (Signed)
Hospice nurse contacted and verbalized understanding.

## 2021-03-16 ENCOUNTER — Ambulatory Visit: Payer: BC Managed Care – PPO

## 2021-03-17 ENCOUNTER — Ambulatory Visit: Payer: BC Managed Care – PPO

## 2021-03-18 ENCOUNTER — Ambulatory Visit: Payer: BC Managed Care – PPO

## 2021-03-19 ENCOUNTER — Ambulatory Visit: Payer: BC Managed Care – PPO

## 2021-03-24 ENCOUNTER — Ambulatory Visit: Payer: BC Managed Care – PPO

## 2021-03-25 ENCOUNTER — Ambulatory Visit: Payer: BC Managed Care – PPO

## 2021-03-26 ENCOUNTER — Ambulatory Visit: Payer: BC Managed Care – PPO

## 2021-03-26 IMAGING — MR MR THORACIC SPINE WO/W CM
4 of 7 series · 9 of 48 positions shown · non-contrast
Comparison: 3020

CLINICAL DATA: Upper back pain, left arm numbness; history of
multiple myeloma

EXAM:
MRI CERVICAL AND THORACIC SPINE WITHOUT CONTRAST
TECHNIQUE: Multiplanar and multiecho pulse sequences of the cervical spine, to
include the craniocervical junction and cervicothoracic junction,
and thoracic and lumbar spine, were obtained without intravenous
contrast.

[Series 4: T1 · sagittal · 4.0mm · 0.43mm/px · 2 of 11 slices shown (1 of 2)]
[im 1/11]
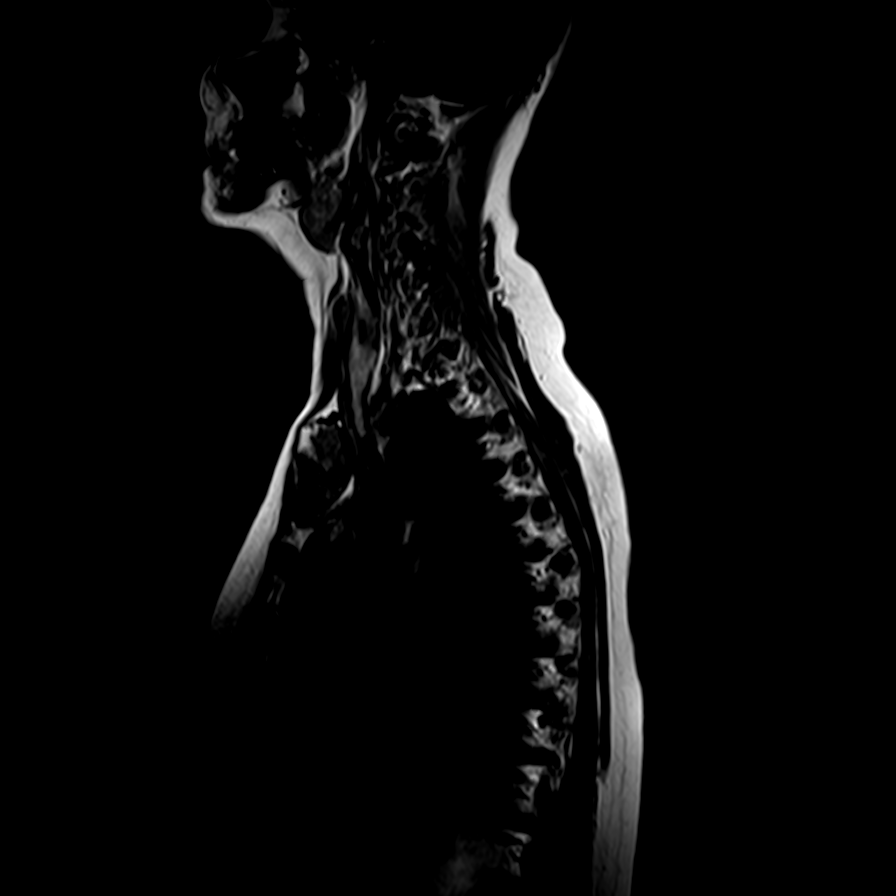
[im 11/11]
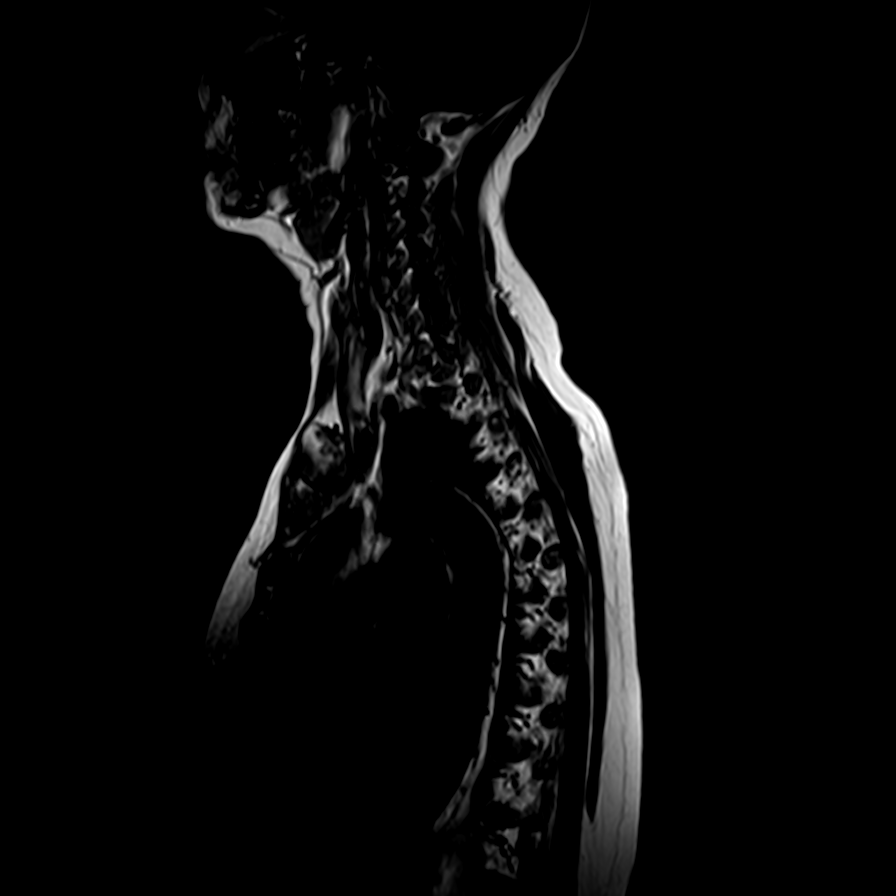

[Series 5: T1 · sagittal · 4.0mm · 0.41mm/px · 1 of 13 slices shown (2 of 2)]
[im 1/13]
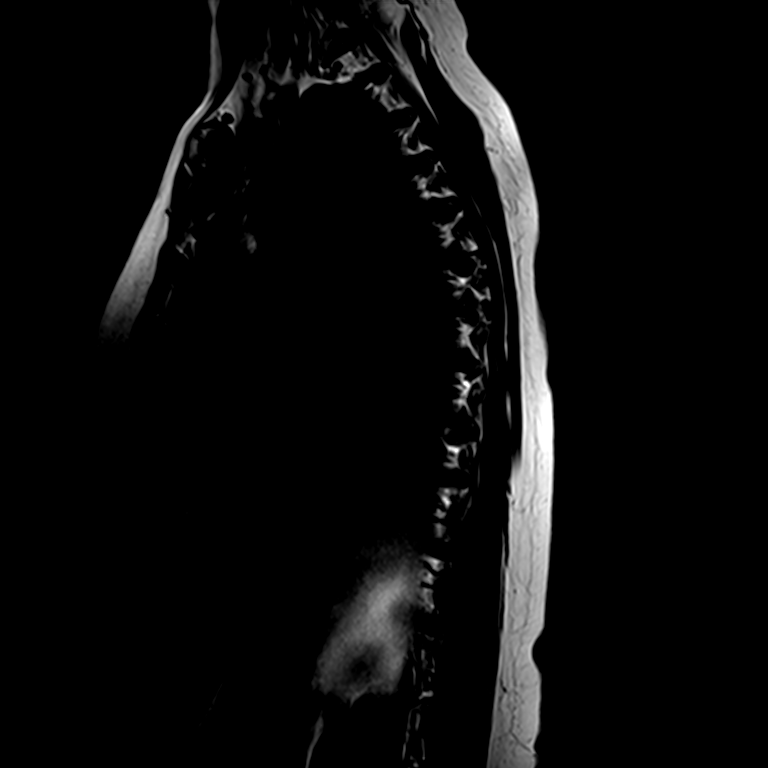

[Series 7: T2 · axial · 3.0mm · 0.29mm/px · z∈[-220,-67]mm · 3 of 41 slices shown (1 of 2)]
[im 5/41]
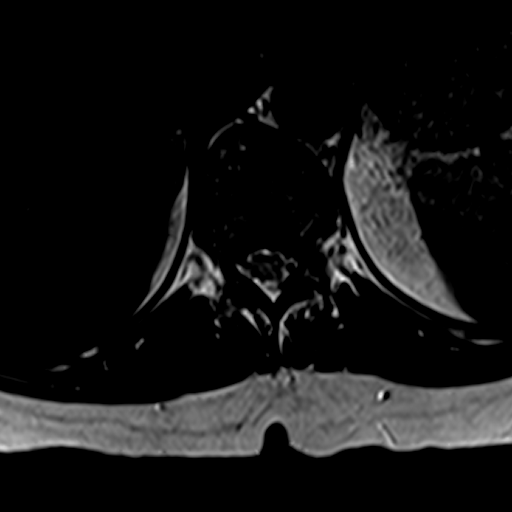
[im 23/41]
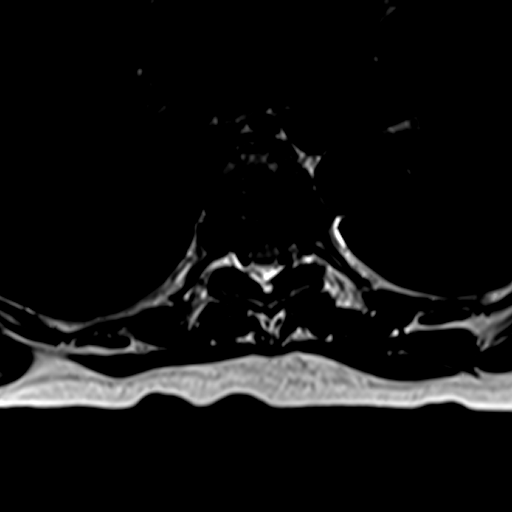
[im 36/41]
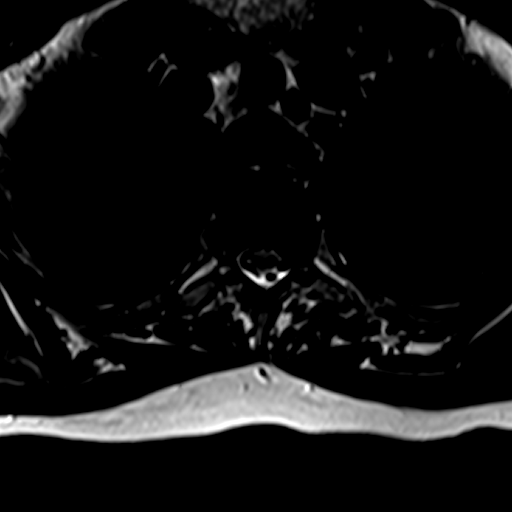

[Series 10: T2 · sagittal · 4.0mm · 0.41mm/px · 3 of 13 slices shown (2 of 2)]
[im 1/13]
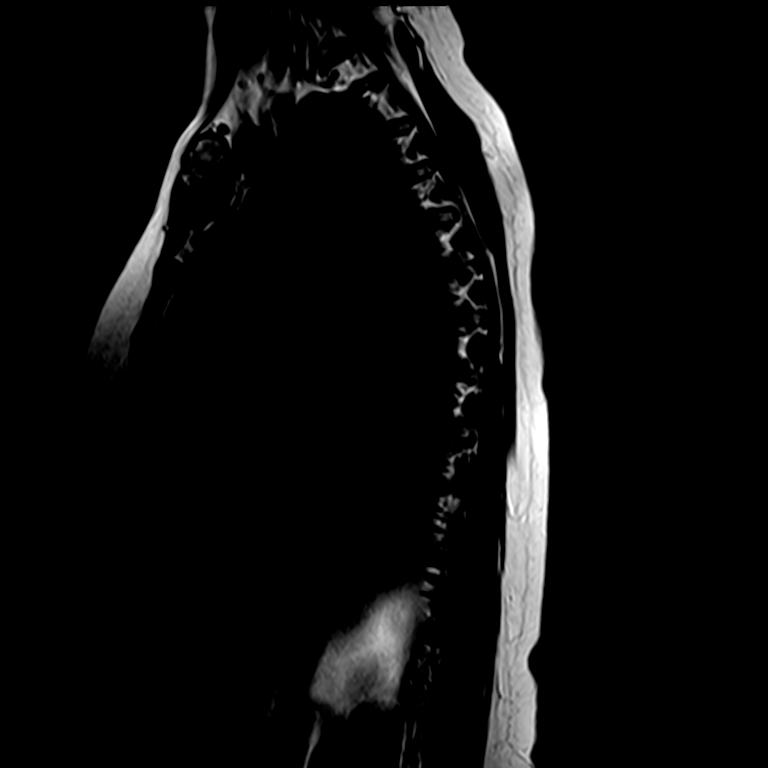
[im 7/13]
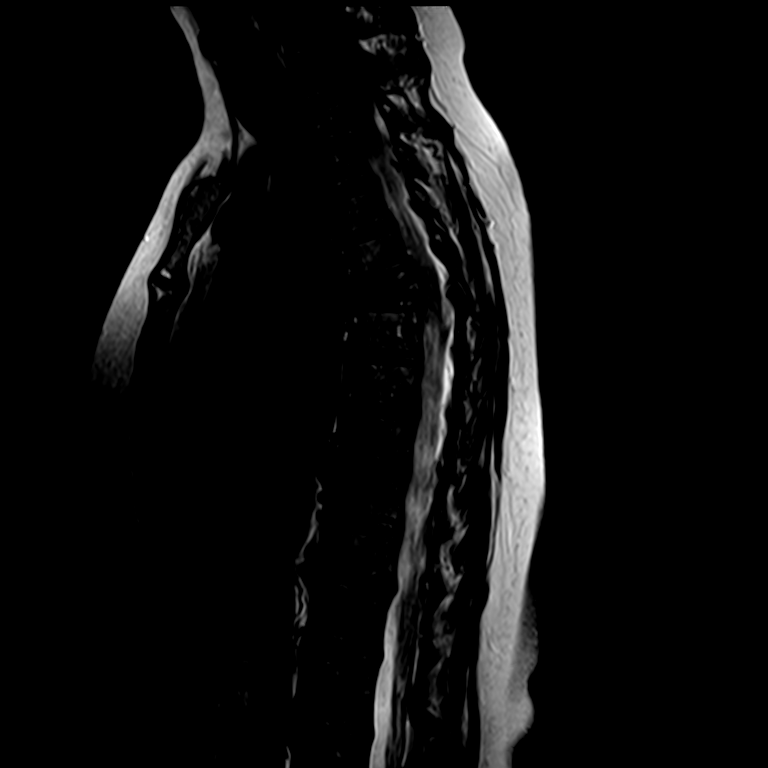
[im 13/13]
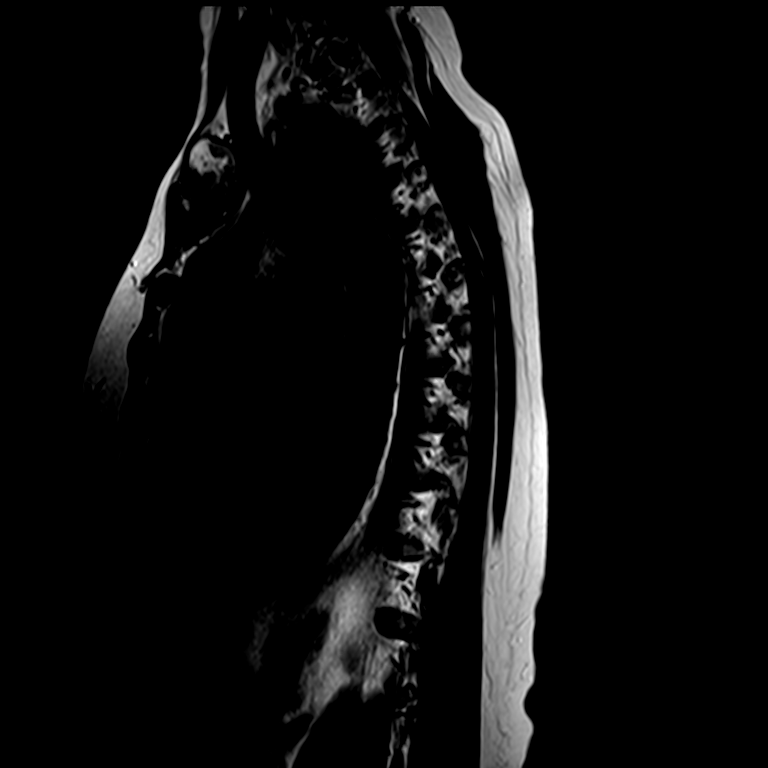

[9 of 48 positions shown; findings below may reference images not displayed]

FINDINGS: MRI CERVICAL SPINE FINDINGS

Alignment: Straightening of the cervical lordosis. Trace
retrolisthesis at C3-C4.

Vertebrae: Vertebral body heights are maintained apart from mild
degenerative endplate irregularity. There is diffusely abnormal T1
marrow signal and STIR hyperintensity reflecting myelomatous
involvement. Left dorsal and lateral epidural soft tissue is present
centered at the T1 level with some extension to the C7 level
superiorly. There is involvement of the left C7-T1 neural foramen.

Cord: Normal cord signal.

Posterior Fossa, vertebral arteries, paraspinal tissues:
Unremarkable.

Disc levels:

C2-C3: Very small central disc protrusion. No canal or foraminal
stenosis.

C3-C4: Disc bulge with endplate osteophytes and superimposed small
central disc protrusion. Bilateral uncovertebral hypertrophy. Mild
canal stenosis. No right foraminal stenosis. Increased moderate left
foraminal stenosis.

C4-C5: Disc bulge with superimposed small central disc protrusion.
No canal or foraminal stenosis.

C5-C6: Disc bulge with endplate osteophytes. Bilateral uncovertebral
hypertrophy. Increased mild to moderate canal stenosis. Increased
moderate right and mild left foraminal stenosis.

C6-C7: Disc bulge with endplate osteophytes. Mild canal stenosis. No
foraminal stenosis.

C7-T1: Left epidural soft tissue as described above. Moderate canal
stenosis. No right foraminal stenosis. Effacement of the left
foramen.

MRI THORACIC SPINE FINDINGS

Alignment:  Exaggerated focal kyphosis at T6.

Vertebrae: Diffusely abnormal marrow signal reflecting myelomatous
infiltration. There is chronic severe compression deformity of T6
with mild osseous retropulsion. Dorsal and left lateral epidural
soft tissue is present at T1 as described above with extension into
the left T1-T2 neural foramen.

Cord:  Normal cord signal.

Paraspinal and other soft tissues: Unremarkable.

Disc levels: At the T1 and T1-T2 levels, epidural soft tissue as
described above causes moderate canal stenosis with rightward
displacement of the cord but no cord compression. There is partial
effacement of the left T1-T2 neural foramen. Mild multilevel
degenerative disc disease with small disc bulges and protrusions but
no significant canal compromise. Mild multilevel facet hypertrophy
is present without high-grade degenerative foraminal stenosis.
IMPRESSION: Diffusely abnormal marrow signal reflecting multiple myeloma. No
acute compression deformity.

Dorsal and left lateral epidural disease at C7-T1 to T1-T2 resulting
in moderate stenosis without cord compression. There is involvement
of the left C7-T1 greater than T1-T2 neural foramina.

Chronic T6 compression deformity with mild osseous retropulsion.

## 2021-03-26 IMAGING — MR MR CERVICAL SPINE WO/W CM
4 of 8 series · 19 of 48 positions shown · non-contrast
Comparison: 3020

CLINICAL DATA: Upper back pain, left arm numbness; history of
multiple myeloma

EXAM:
MRI CERVICAL AND THORACIC SPINE WITHOUT CONTRAST
TECHNIQUE: Multiplanar and multiecho pulse sequences of the cervical spine, to
include the craniocervical junction and cervicothoracic junction,
and thoracic and lumbar spine, were obtained without intravenous
contrast.

[Series 4: T2 · sagittal · 3.0mm · 0.66mm/px · 4 of 15 slices shown (1 of 2)]
[im 1/15]
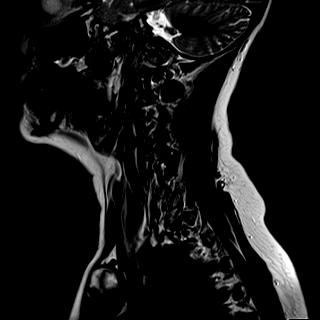
[im 5/15]
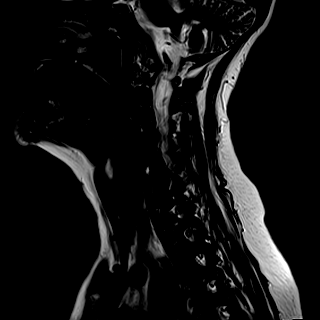
[im 10/15]
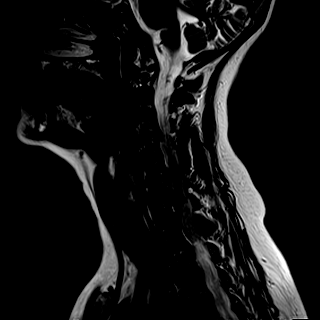
[im 15/15]
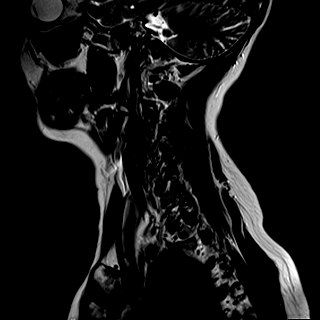

[Series 6: T2 · axial · 3.0mm · 0.25mm/px · z∈[-23,+68]mm · 8 of 29 slices shown (2 of 2)]
[im 1/29]
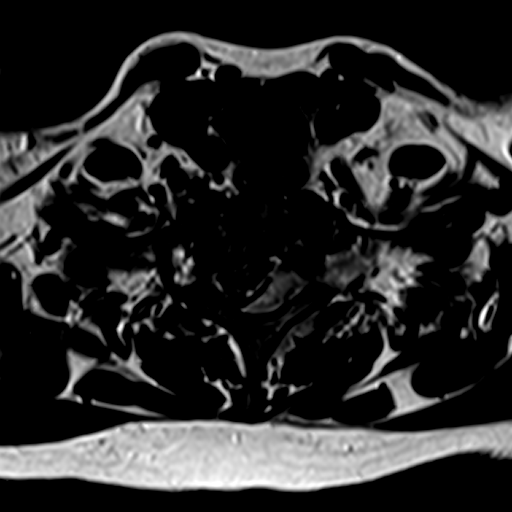
[im 5/29]
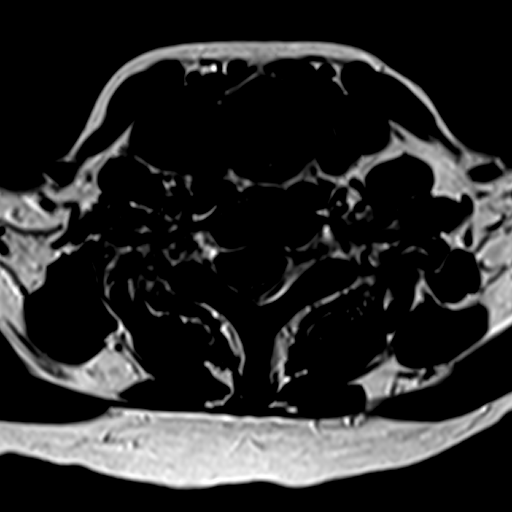
[im 9/29]
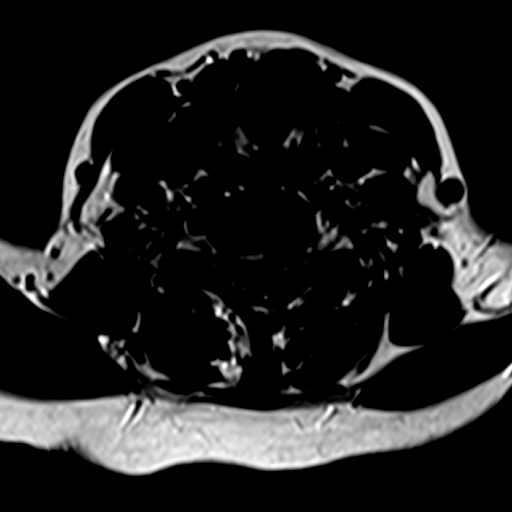
[im 13/29]
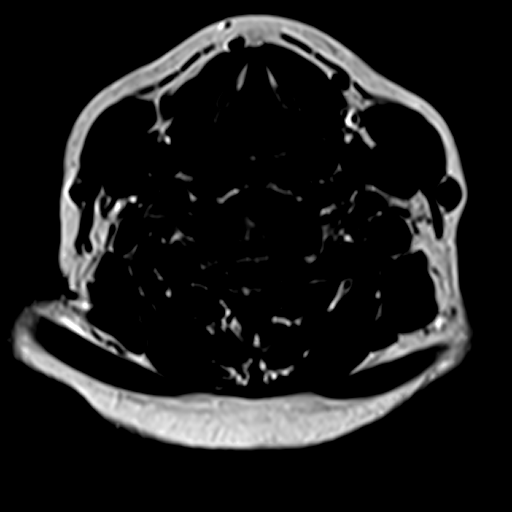
[im 17/29]
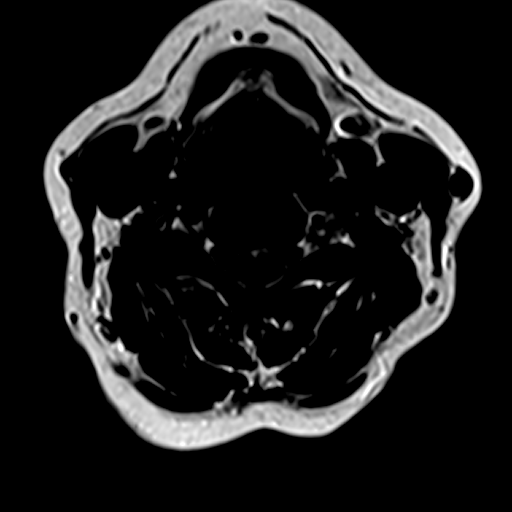
[im 21/29]
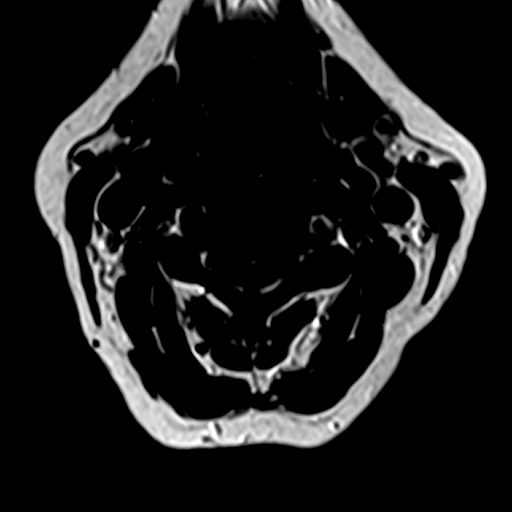
[im 25/29]
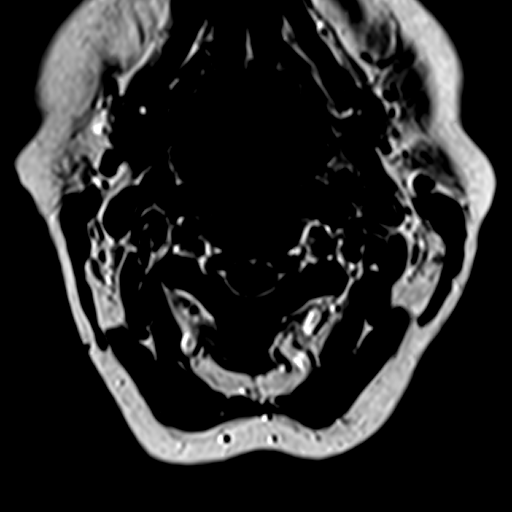
[im 29/29]
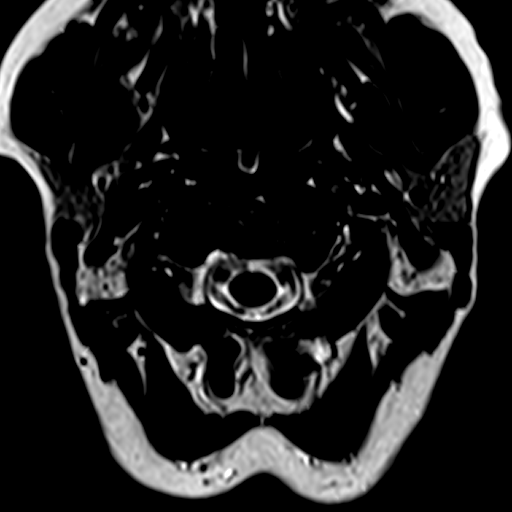

[Series 8: T1 · axial · 3.0mm · 0.22mm/px · z∈[-24,+54]mm · 4 of 29 slices shown]
[im 1/29]
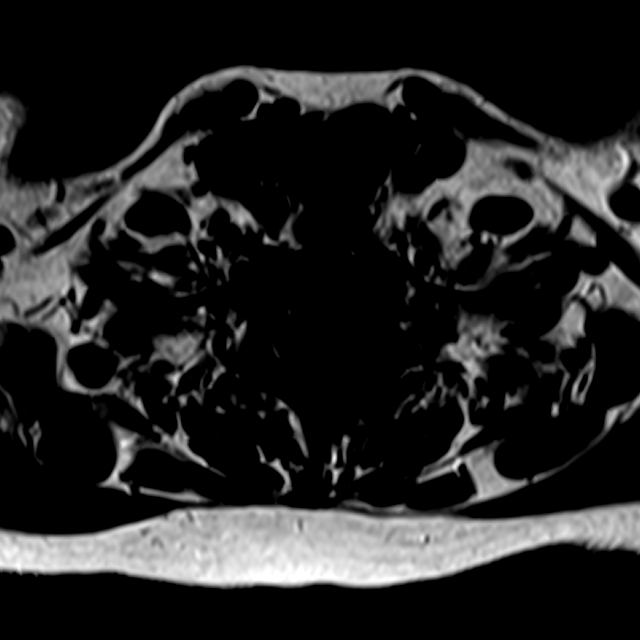
[im 5/29]
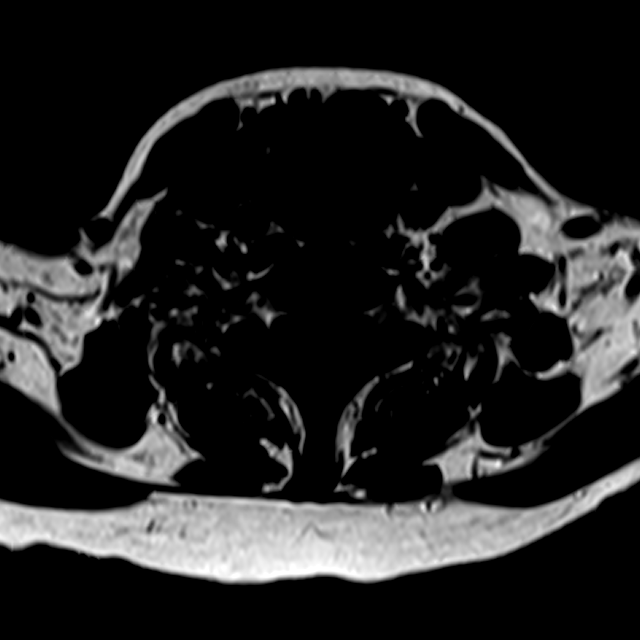
[im 17/29]
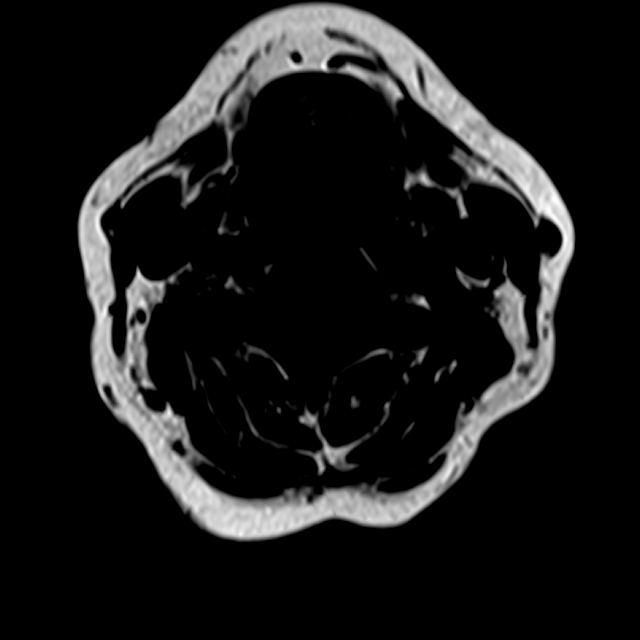
[im 25/29]
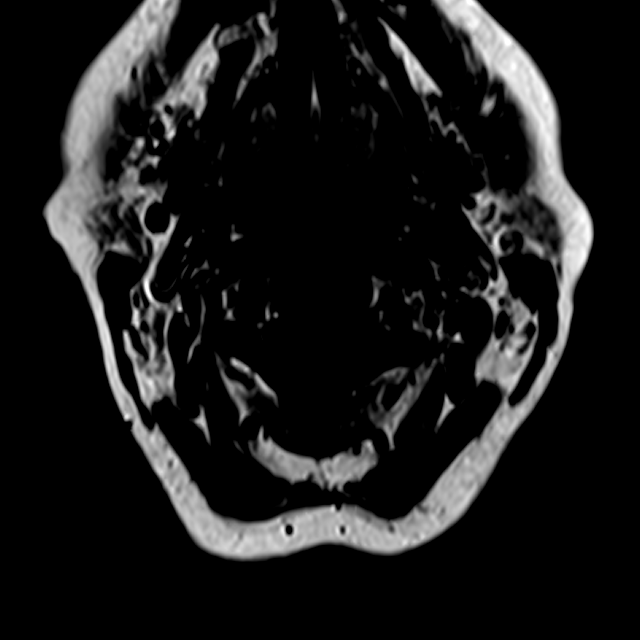

[Series 9: T1 fat-sat post-contrast · sagittal · 3.0mm · 0.68mm/px · 3 of 15 slices shown]
[im 1/15]
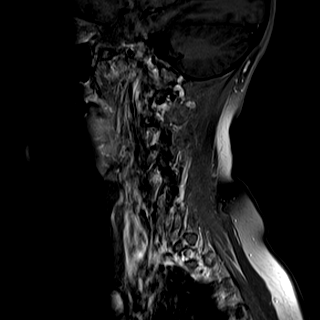
[im 10/15]
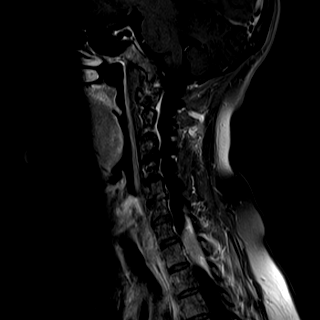
[im 15/15]
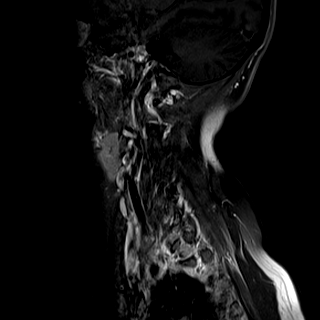

[19 of 48 positions shown; findings below may reference images not displayed]

FINDINGS: MRI CERVICAL SPINE FINDINGS

Alignment: Straightening of the cervical lordosis. Trace
retrolisthesis at C3-C4.

Vertebrae: Vertebral body heights are maintained apart from mild
degenerative endplate irregularity. There is diffusely abnormal T1
marrow signal and STIR hyperintensity reflecting myelomatous
involvement. Left dorsal and lateral epidural soft tissue is present
centered at the T1 level with some extension to the C7 level
superiorly. There is involvement of the left C7-T1 neural foramen.

Cord: Normal cord signal.

Posterior Fossa, vertebral arteries, paraspinal tissues:
Unremarkable.

Disc levels:

C2-C3: Very small central disc protrusion. No canal or foraminal
stenosis.

C3-C4: Disc bulge with endplate osteophytes and superimposed small
central disc protrusion. Bilateral uncovertebral hypertrophy. Mild
canal stenosis. No right foraminal stenosis. Increased moderate left
foraminal stenosis.

C4-C5: Disc bulge with superimposed small central disc protrusion.
No canal or foraminal stenosis.

C5-C6: Disc bulge with endplate osteophytes. Bilateral uncovertebral
hypertrophy. Increased mild to moderate canal stenosis. Increased
moderate right and mild left foraminal stenosis.

C6-C7: Disc bulge with endplate osteophytes. Mild canal stenosis. No
foraminal stenosis.

C7-T1: Left epidural soft tissue as described above. Moderate canal
stenosis. No right foraminal stenosis. Effacement of the left
foramen.

MRI THORACIC SPINE FINDINGS

Alignment:  Exaggerated focal kyphosis at T6.

Vertebrae: Diffusely abnormal marrow signal reflecting myelomatous
infiltration. There is chronic severe compression deformity of T6
with mild osseous retropulsion. Dorsal and left lateral epidural
soft tissue is present at T1 as described above with extension into
the left T1-T2 neural foramen.

Cord:  Normal cord signal.

Paraspinal and other soft tissues: Unremarkable.

Disc levels: At the T1 and T1-T2 levels, epidural soft tissue as
described above causes moderate canal stenosis with rightward
displacement of the cord but no cord compression. There is partial
effacement of the left T1-T2 neural foramen. Mild multilevel
degenerative disc disease with small disc bulges and protrusions but
no significant canal compromise. Mild multilevel facet hypertrophy
is present without high-grade degenerative foraminal stenosis.
IMPRESSION: Diffusely abnormal marrow signal reflecting multiple myeloma. No
acute compression deformity.

Dorsal and left lateral epidural disease at C7-T1 to T1-T2 resulting
in moderate stenosis without cord compression. There is involvement
of the left C7-T1 greater than T1-T2 neural foramina.

Chronic T6 compression deformity with mild osseous retropulsion.

## 2021-03-27 DEATH — deceased
